# Patient Record
Sex: Female | Born: 1975
Health system: Southern US, Community
[De-identification: ages and names within clinical notes are randomized; demographics above are authoritative.]

## PROBLEM LIST (undated history)

## (undated) DIAGNOSIS — K2950 Unspecified chronic gastritis without bleeding: Secondary | ICD-10-CM

## (undated) DIAGNOSIS — J45909 Unspecified asthma, uncomplicated: Secondary | ICD-10-CM

## (undated) DIAGNOSIS — G4730 Sleep apnea, unspecified: Secondary | ICD-10-CM

## (undated) DIAGNOSIS — K449 Diaphragmatic hernia without obstruction or gangrene: Secondary | ICD-10-CM

## (undated) DIAGNOSIS — Z8719 Personal history of other diseases of the digestive system: Secondary | ICD-10-CM

## (undated) DIAGNOSIS — N83201 Unspecified ovarian cyst, right side: Secondary | ICD-10-CM

## (undated) DIAGNOSIS — N809 Endometriosis, unspecified: Secondary | ICD-10-CM

## (undated) DIAGNOSIS — K219 Gastro-esophageal reflux disease without esophagitis: Secondary | ICD-10-CM

## (undated) DIAGNOSIS — N3281 Overactive bladder: Secondary | ICD-10-CM

## (undated) DIAGNOSIS — Z9889 Other specified postprocedural states: Secondary | ICD-10-CM

## (undated) DIAGNOSIS — R519 Headache, unspecified: Secondary | ICD-10-CM

## (undated) DIAGNOSIS — Z8669 Personal history of other diseases of the nervous system and sense organs: Secondary | ICD-10-CM

## (undated) DIAGNOSIS — N393 Stress incontinence (female) (male): Secondary | ICD-10-CM

## (undated) DIAGNOSIS — E041 Nontoxic single thyroid nodule: Secondary | ICD-10-CM

## (undated) DIAGNOSIS — G43909 Migraine, unspecified, not intractable, without status migrainosus: Secondary | ICD-10-CM

## (undated) DIAGNOSIS — F419 Anxiety disorder, unspecified: Secondary | ICD-10-CM

## (undated) DIAGNOSIS — F32A Depression, unspecified: Secondary | ICD-10-CM

## (undated) DIAGNOSIS — Z8659 Personal history of other mental and behavioral disorders: Secondary | ICD-10-CM

## (undated) DIAGNOSIS — K2270 Barrett's esophagus without dysplasia: Secondary | ICD-10-CM

## (undated) DIAGNOSIS — F329 Major depressive disorder, single episode, unspecified: Secondary | ICD-10-CM

## (undated) DIAGNOSIS — T4145XA Adverse effect of unspecified anesthetic, initial encounter: Secondary | ICD-10-CM

## (undated) DIAGNOSIS — T7840XA Allergy, unspecified, initial encounter: Secondary | ICD-10-CM

## (undated) DIAGNOSIS — D649 Anemia, unspecified: Secondary | ICD-10-CM

## (undated) DIAGNOSIS — E669 Obesity, unspecified: Secondary | ICD-10-CM

## (undated) DIAGNOSIS — E282 Polycystic ovarian syndrome: Secondary | ICD-10-CM

## (undated) DIAGNOSIS — T8859XA Other complications of anesthesia, initial encounter: Secondary | ICD-10-CM

## (undated) DIAGNOSIS — R112 Nausea with vomiting, unspecified: Secondary | ICD-10-CM

## (undated) DIAGNOSIS — R51 Headache: Secondary | ICD-10-CM

## (undated) HISTORY — PX: TUBAL LIGATION: SHX77

## (undated) HISTORY — PX: WISDOM TOOTH EXTRACTION: SHX21

## (undated) HISTORY — PX: TEMPOROMANDIBULAR JOINT SURGERY: SHX35

## (undated) HISTORY — DX: Allergy, unspecified, initial encounter: T78.40XA

## (undated) HISTORY — PX: DIAGNOSTIC LAPAROSCOPY: SUR761

## (undated) HISTORY — PX: CHOLECYSTECTOMY: SHX55

## (undated) HISTORY — PX: KNEE ARTHROSCOPY: SUR90

## (undated) HISTORY — PX: ESOPHAGOGASTRODUODENOSCOPY: SHX1529

---

## 2001-07-01 HISTORY — PX: TEMPOROMANDIBULAR JOINT SURGERY: SHX35

## 2003-07-02 DIAGNOSIS — Z8672 Personal history of thrombophlebitis: Secondary | ICD-10-CM

## 2003-07-02 HISTORY — DX: Personal history of thrombophlebitis: Z86.72

## 2004-07-01 HISTORY — PX: KNEE ARTHROSCOPY: SUR90

## 2004-07-01 HISTORY — PX: DIAGNOSTIC LAPAROSCOPY: SUR761

## 2009-07-01 HISTORY — PX: LAPAROSCOPIC CHOLECYSTECTOMY: SUR755

## 2010-08-07 ENCOUNTER — Ambulatory Visit: Payer: Self-pay | Admitting: Family Medicine

## 2011-03-05 ENCOUNTER — Ambulatory Visit: Payer: Self-pay | Admitting: Gastroenterology

## 2011-03-07 LAB — PATHOLOGY REPORT

## 2011-07-29 DIAGNOSIS — Z8639 Personal history of other endocrine, nutritional and metabolic disease: Secondary | ICD-10-CM | POA: Insufficient documentation

## 2012-05-01 ENCOUNTER — Ambulatory Visit: Payer: Self-pay | Admitting: Gastroenterology

## 2012-05-06 LAB — PATHOLOGY REPORT

## 2012-06-08 ENCOUNTER — Ambulatory Visit: Payer: Self-pay | Admitting: Gastroenterology

## 2012-06-08 IMAGING — US ABDOMEN ULTRASOUND LIMITED
1 series · 14 of 25 positions shown · non-contrast
Comparison: none

REASON FOR EXAM: RUQ abd pain Dr requests general not ltd per RIPFUMELO
COMMENTS:

PROCEDURE:     RIPFUMELO - RIPFUMELO ABDOMEN UPPER GENERAL  - [DATE]  [DATE]
RESULT:

[Series 1: abdomen ultrasound limited · 0.43mm/px · 14 of 77 slices shown]
[im 1/77]
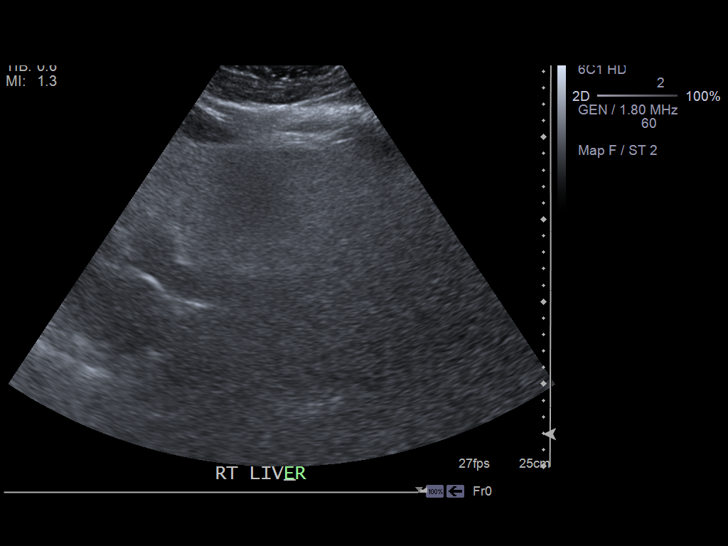
[im 7/77]
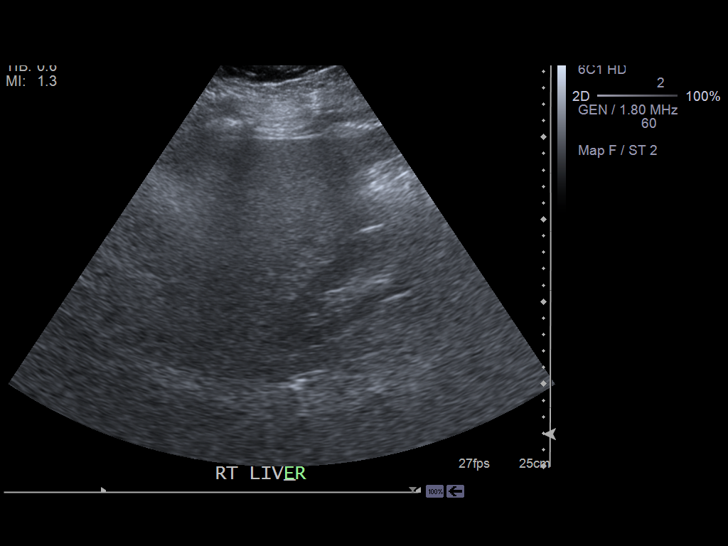
[im 13/77]
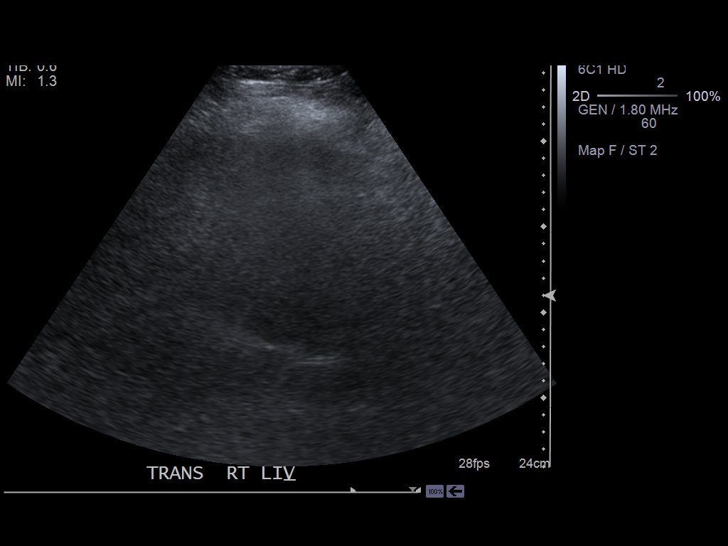
[im 20/77]
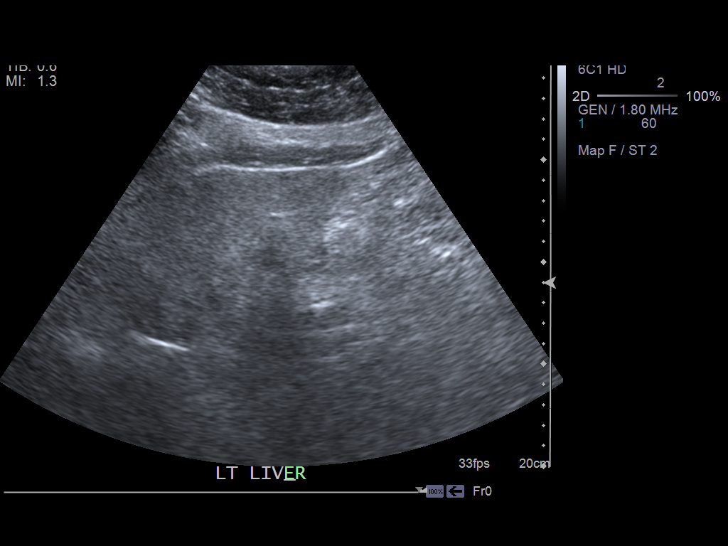
[im 26/77]
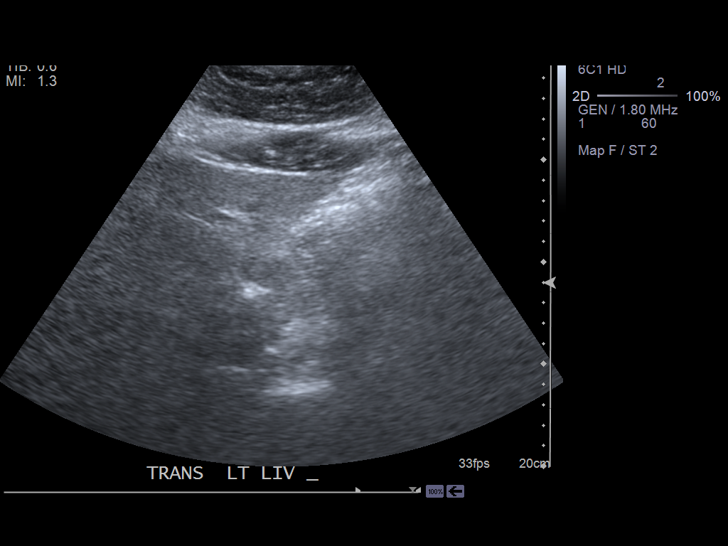
[im 29/77]
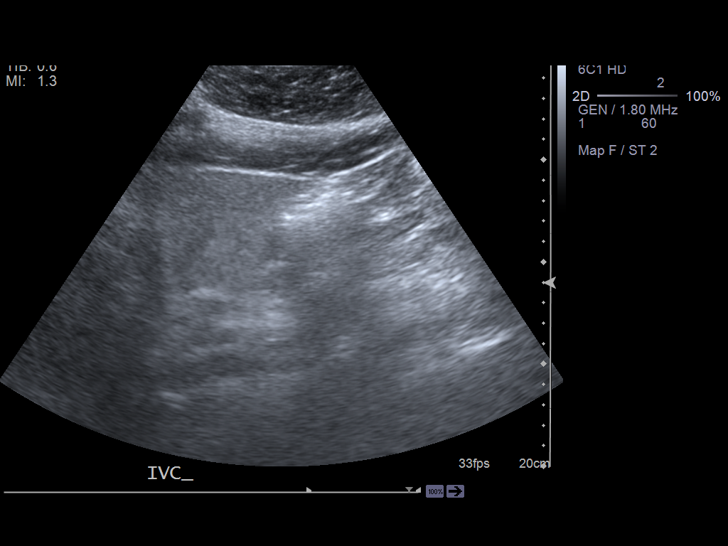
[im 35/77]
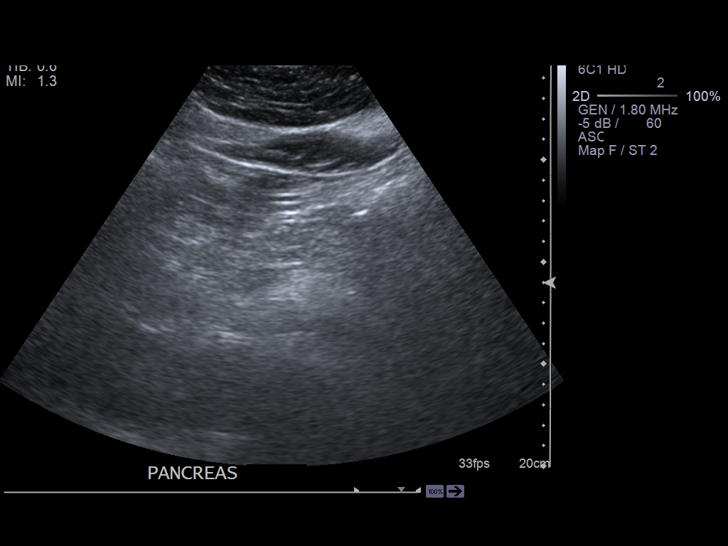
[im 42/77]
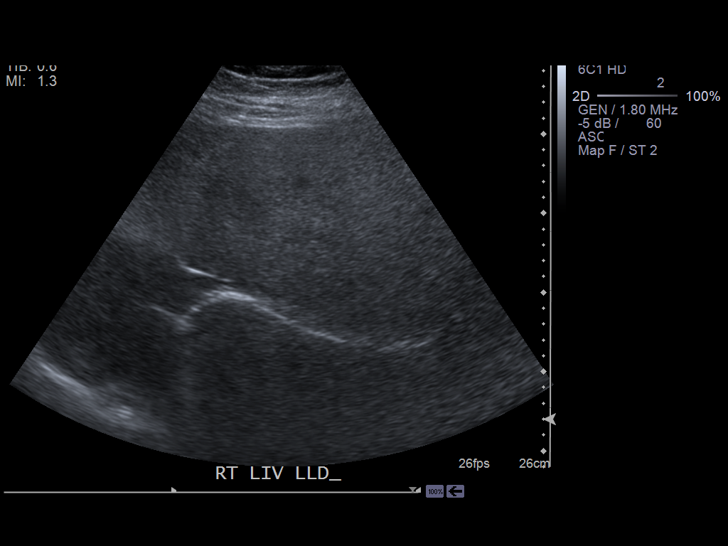
[im 48/77]
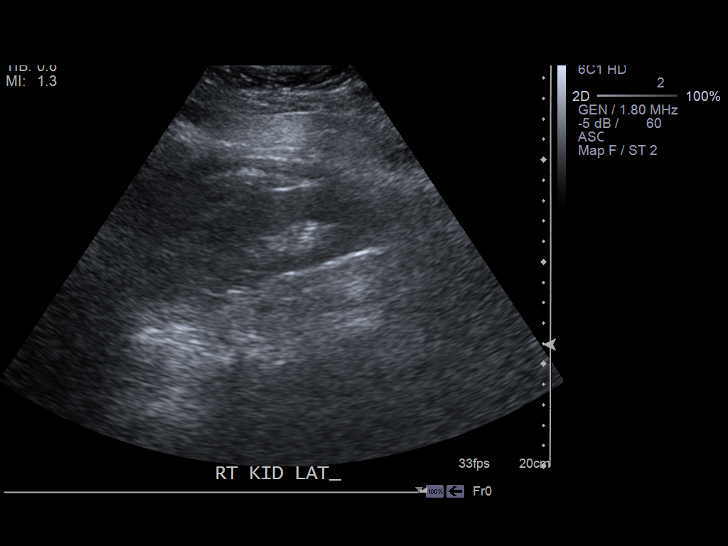
[im 51/77]
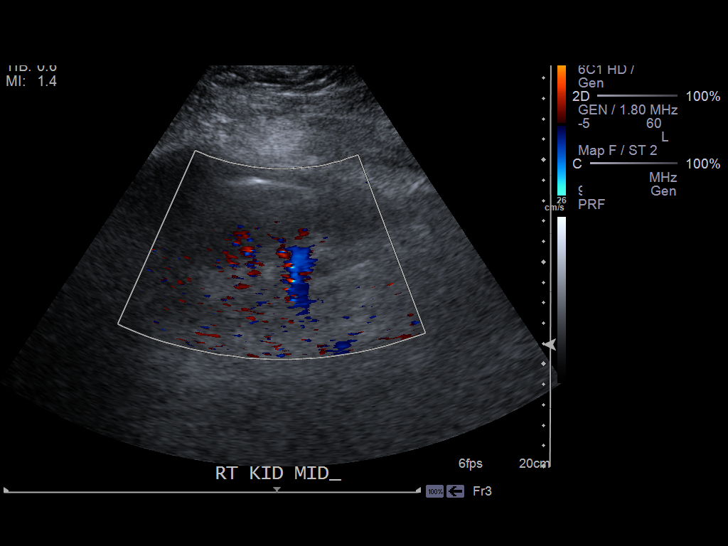
[im 58/77]
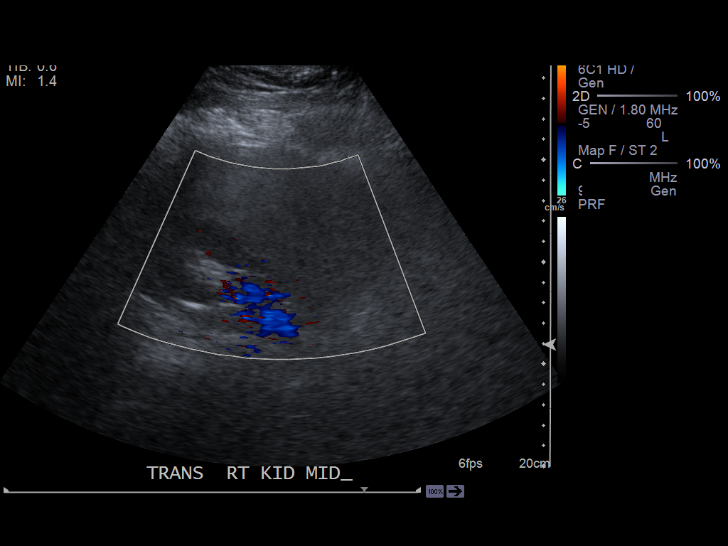
[im 64/77]
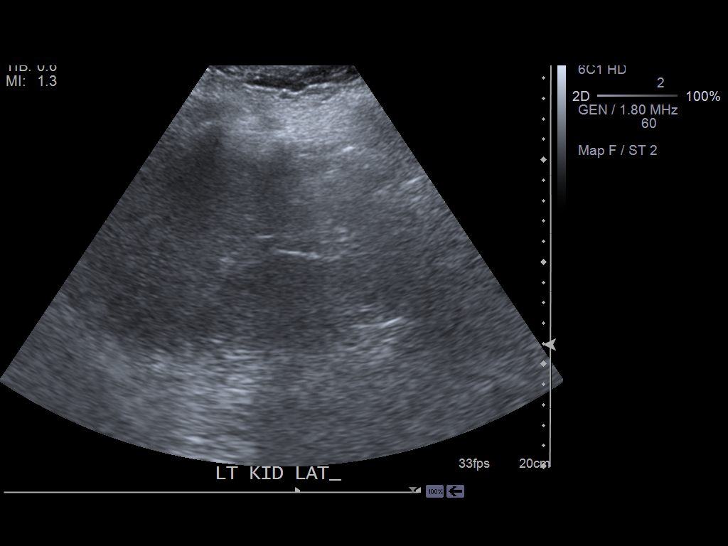
[im 70/77]
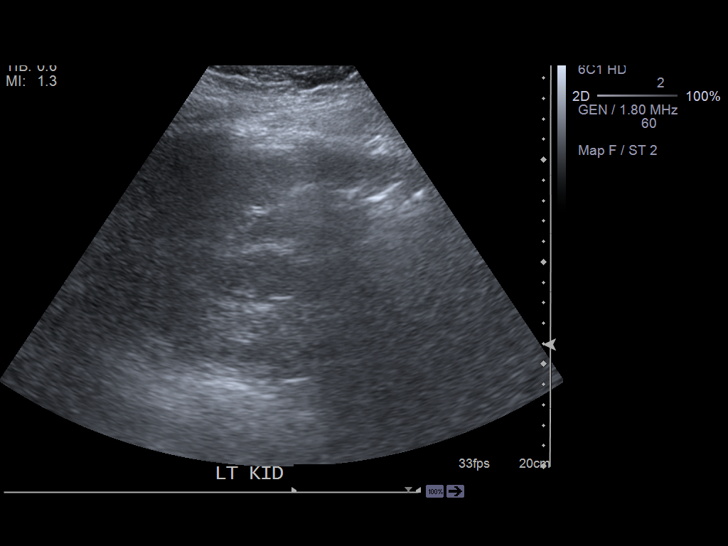
[im 77/77]
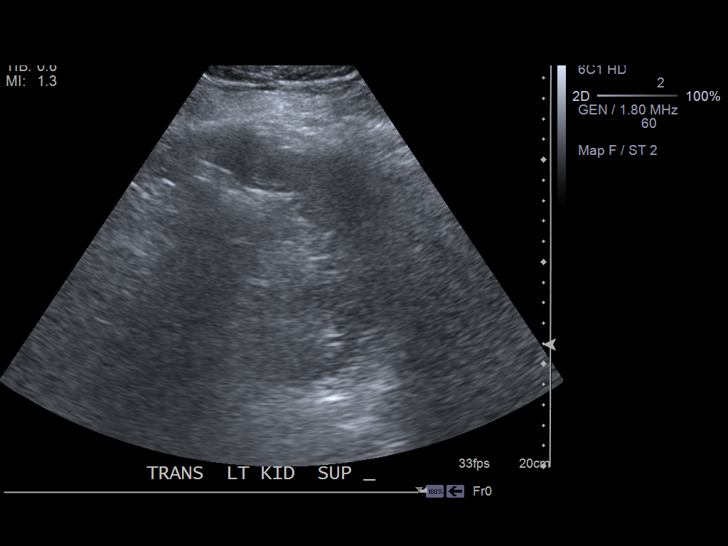

[14 of 25 positions shown; findings below may reference images not displayed]

FINDINGS: Sonographic evaluation of the abdomen is performed. There is a
history of cholecystectomy. The liver is echogenic and slightly enlarged at
18.49 cm in length. There is no intrahepatic biliary ductal dilation or
evidence of a focal hepatic mass. The portal venous flow is normal. No
ascites is evident. The common bile duct diameter is 5.0 cm. The pancreas
could not be visualized for overlying bowel gas. The spleen is normal in
size and echotexture as are the kidneys. The right kidney measures 12.11 x
4.62 x 5.31 cm. The spleen measures 11 cm. The left kidney measures 12.19 x
5.45 x 5.54 cm.
IMPRESSION: 1.  Increased echotexture of the liver likely secondary to diffuse fatty
infiltration. Mild hepatomegaly.
2.  Nonvisualization of the pancreas.
3.  Status post cholecystectomy.
4.  Otherwise, unremarkable exam. This was ultrasound of the abdomen and not
a limited abdominal sonogram.

[REDACTED]

## 2012-07-07 ENCOUNTER — Ambulatory Visit: Payer: Self-pay | Admitting: Internal Medicine

## 2012-07-07 LAB — PREGNANCY, URINE: Pregnancy Test, Urine: NEGATIVE m[IU]/mL

## 2012-07-07 IMAGING — CR DG CHEST 2V
1 series · 2 of 2 positions shown · non-contrast
Comparison: none

REASON FOR EXAM: cough
COMMENTS:

PROCEDURE:     MDR - MDR CHEST PA(OR AP) AND LATERAL  - [DATE] [DATE]
RESULT:     The lungs are clear. The heart and pulmonary vessels are normal.
The bony and mediastinal structures are unremarkable. There is no effusion.
There is no pneumothorax or evidence of congestive failure.

[Series 1: pa · 0.17mm/px · 2 of 2 slices shown]
[im 1/2]
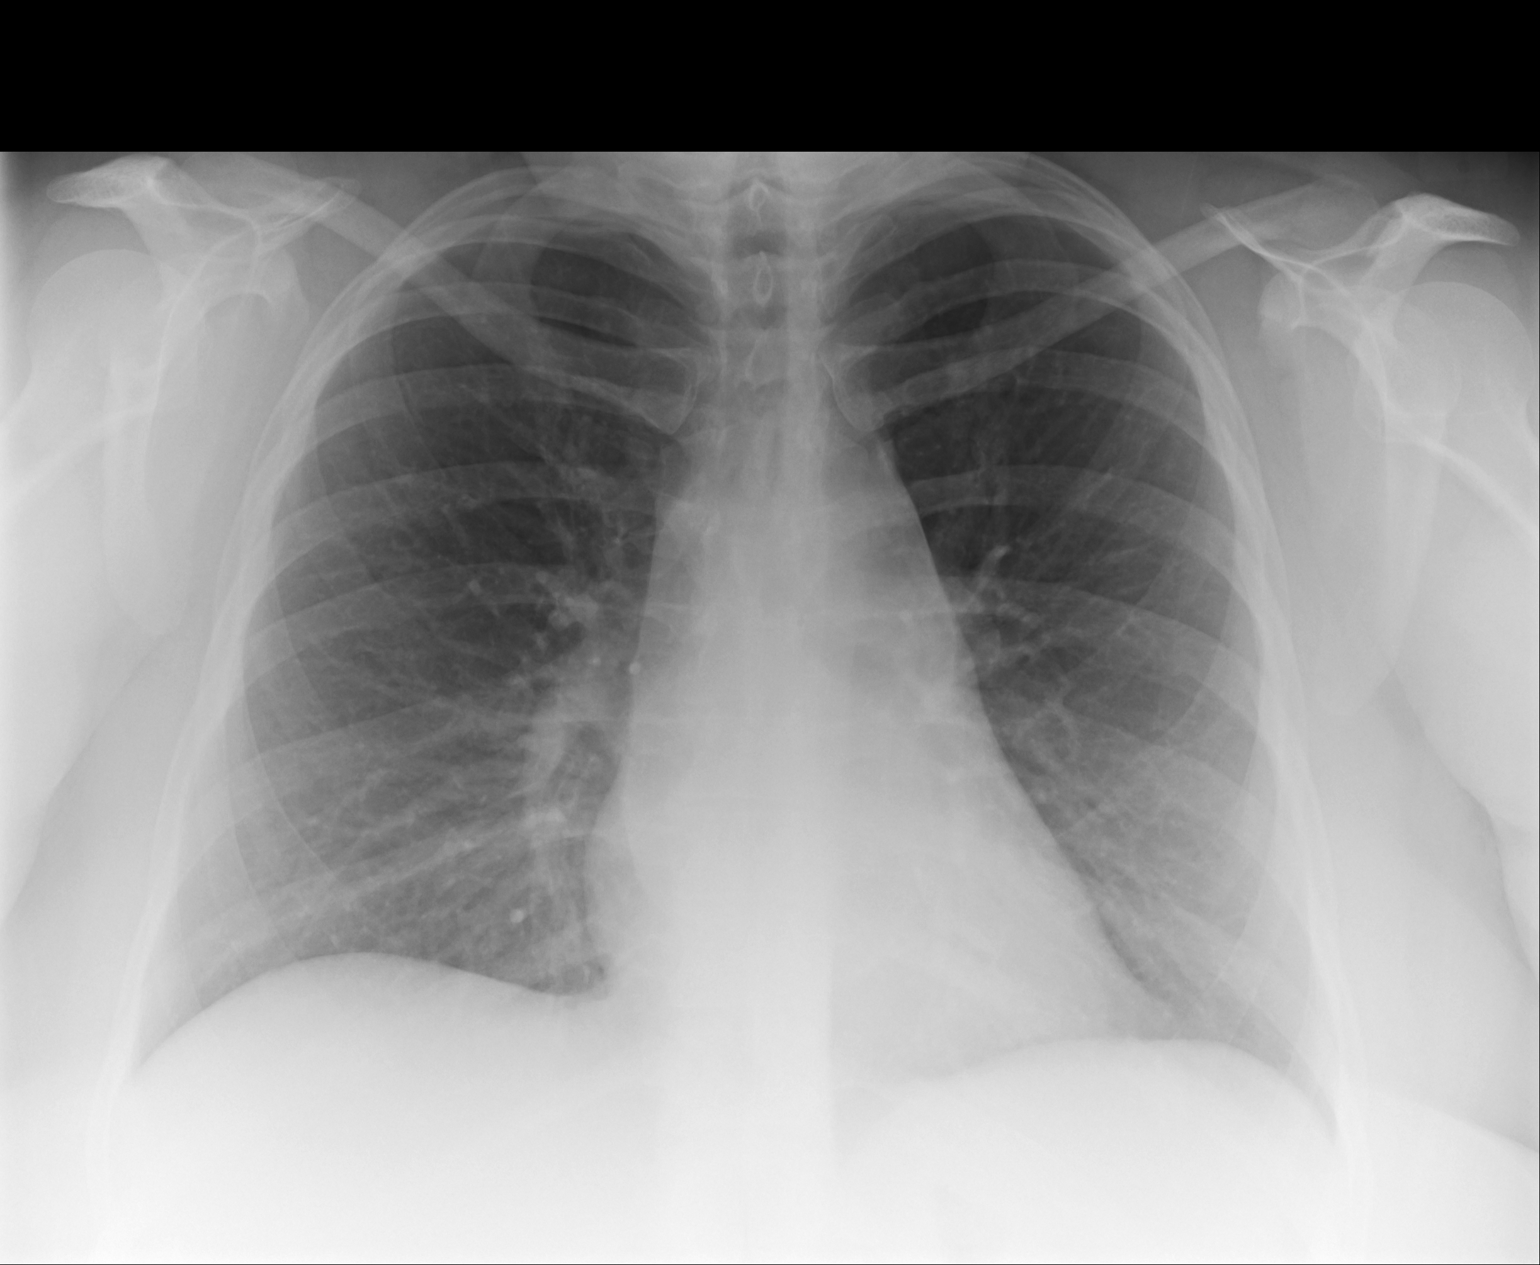
[im 2/2]
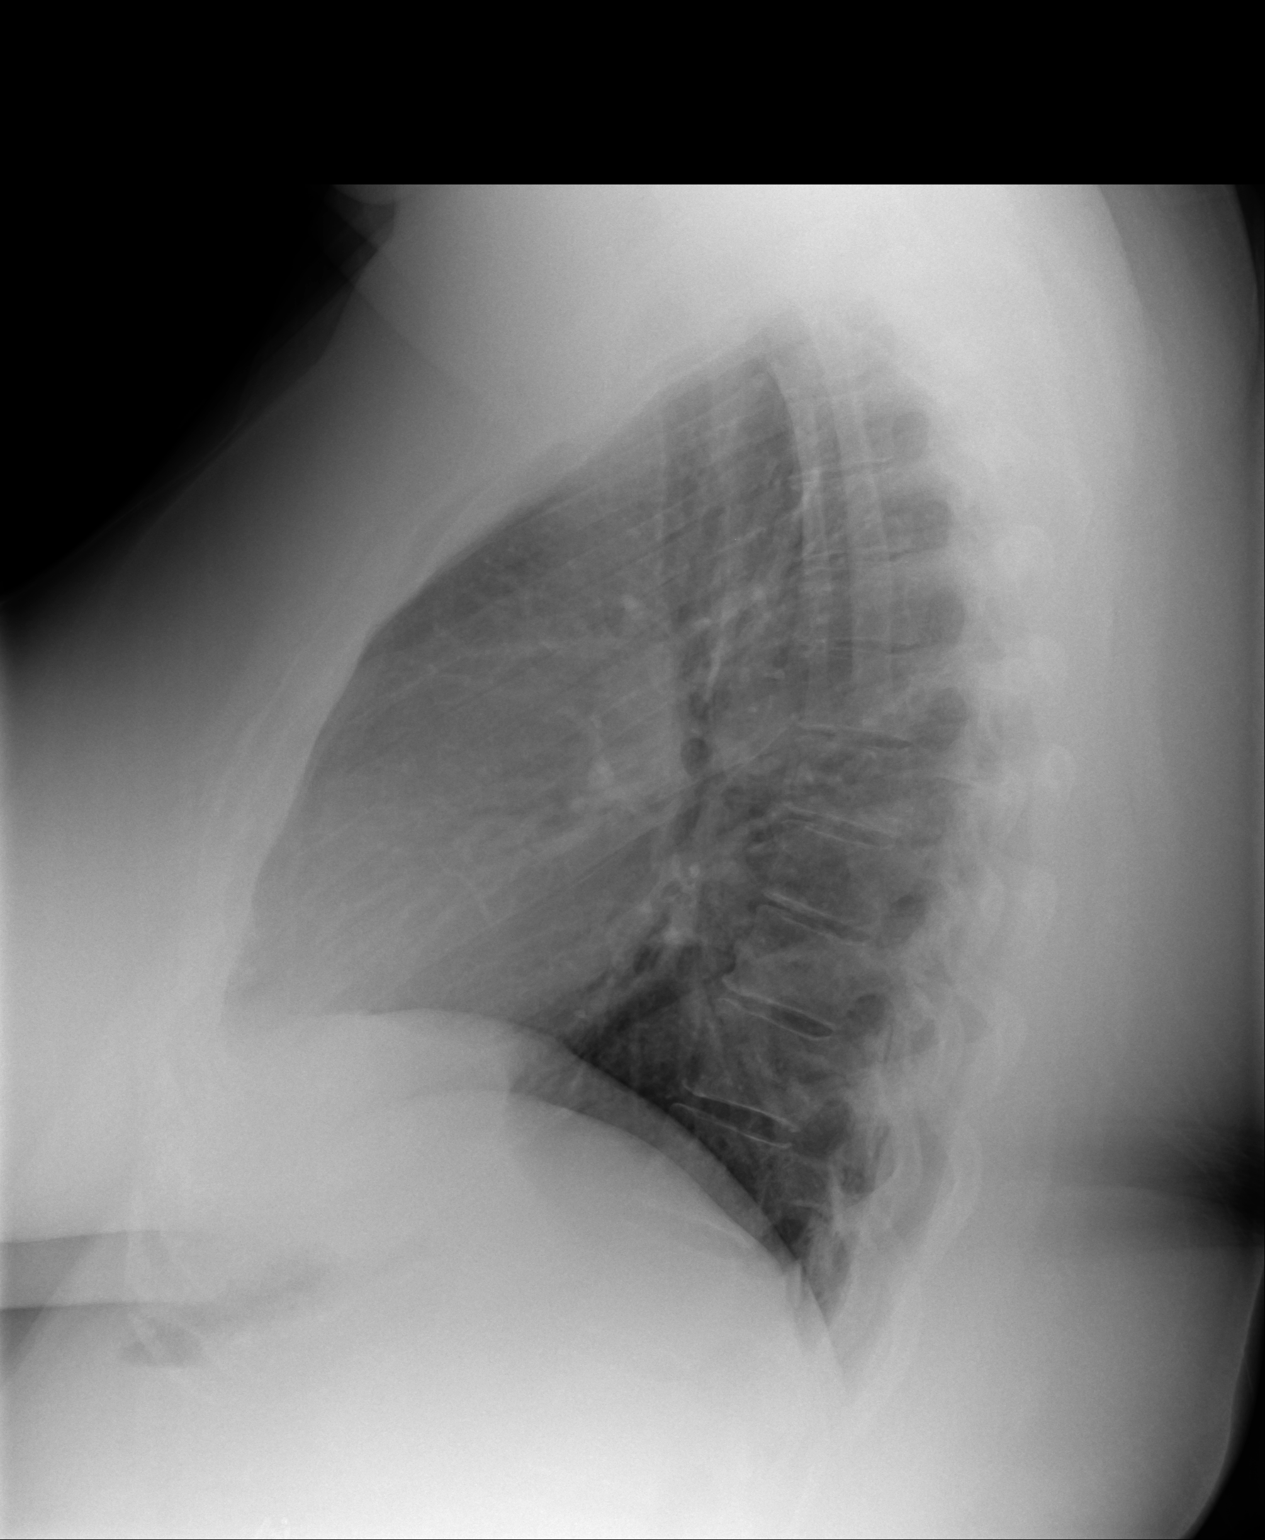

[2 of 2 positions shown; findings below may reference images not displayed]

IMPRESSION: No acute cardiopulmonary disease.

[REDACTED]

## 2012-07-18 ENCOUNTER — Emergency Department: Payer: Self-pay | Admitting: Internal Medicine

## 2012-07-18 LAB — BASIC METABOLIC PANEL
Anion Gap: 6 — ABNORMAL LOW (ref 7–16)
BUN: 9 mg/dL (ref 7–18)
Calcium, Total: 8.4 mg/dL — ABNORMAL LOW (ref 8.5–10.1)
Chloride: 103 mmol/L (ref 98–107)
Co2: 29 mmol/L (ref 21–32)
Creatinine: 0.82 mg/dL (ref 0.60–1.30)
EGFR (African American): >60
EGFR (Non-African Amer.): >60
Glucose: 83 mg/dL (ref 65–99)
Osmolality: 274 (ref 275–301)
Potassium: 3.8 mmol/L (ref 3.5–5.1)
Sodium: 138 mmol/L (ref 136–145)

## 2012-07-18 LAB — CBC
HCT: 42.5 % (ref 35.0–47.0)
HGB: 13.3 g/dL (ref 12.0–16.0)
MCH: 26.7 pg (ref 26.0–34.0)
MCHC: 31.2 g/dL — ABNORMAL LOW (ref 32.0–36.0)
MCV: 86 fL (ref 80–100)
Platelet: 288 10*3/uL (ref 150–440)
RBC: 4.97 10*6/uL (ref 3.80–5.20)
RDW: 13.3 % (ref 11.5–14.5)
WBC: 13.3 10*3/uL — ABNORMAL HIGH (ref 3.6–11.0)

## 2012-07-18 LAB — HCG, QUANTITATIVE, PREGNANCY: Beta Hcg, Quant.: <1 m[IU]/mL — ABNORMAL LOW

## 2012-07-18 IMAGING — CR DG CHEST 2V
1 series · 2 of 2 positions shown · non-contrast
Comparison: none

REASON FOR EXAM: SOB
COMMENTS:

PROCEDURE:     DXR - DXR CHEST PA (OR AP) AND LATERAL  - [DATE]  [DATE]
RESULT:     Comparison: [DATE]

[Series 1: pa · 0.17mm/px · 2 of 2 slices shown]
[im 1/2]
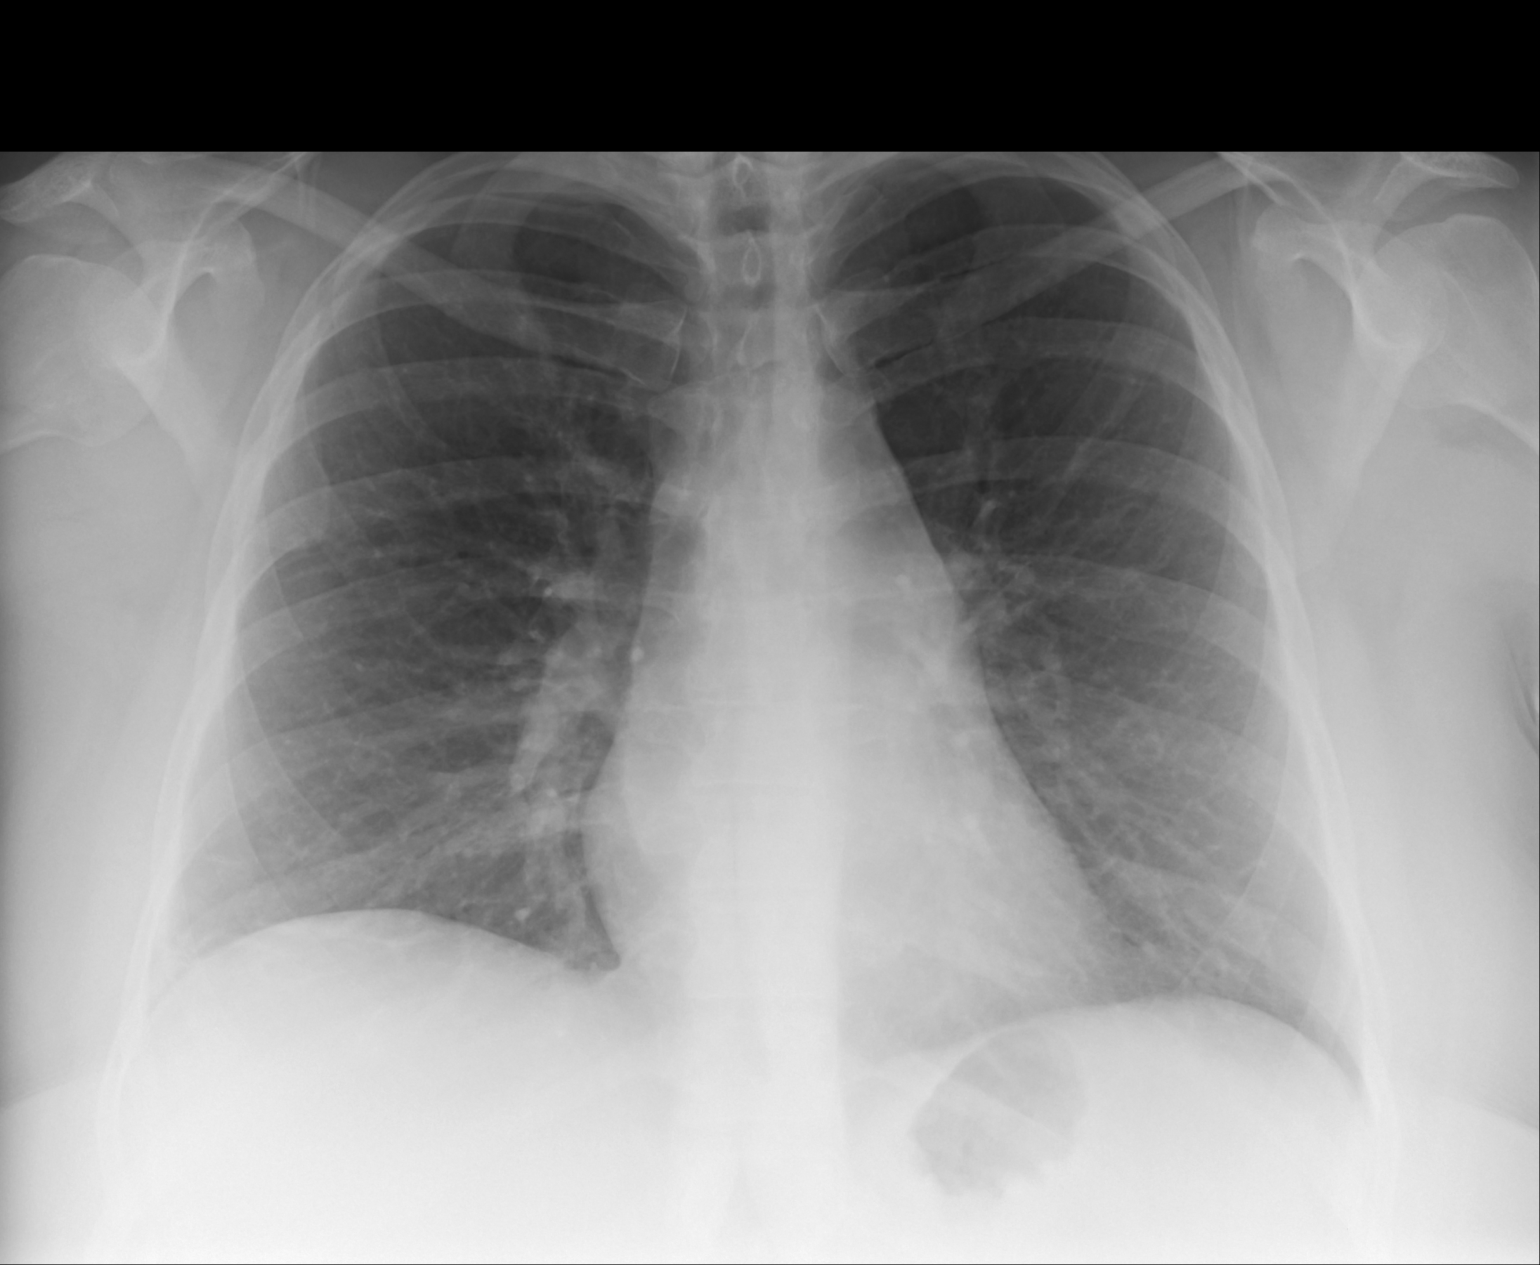
[im 2/2]
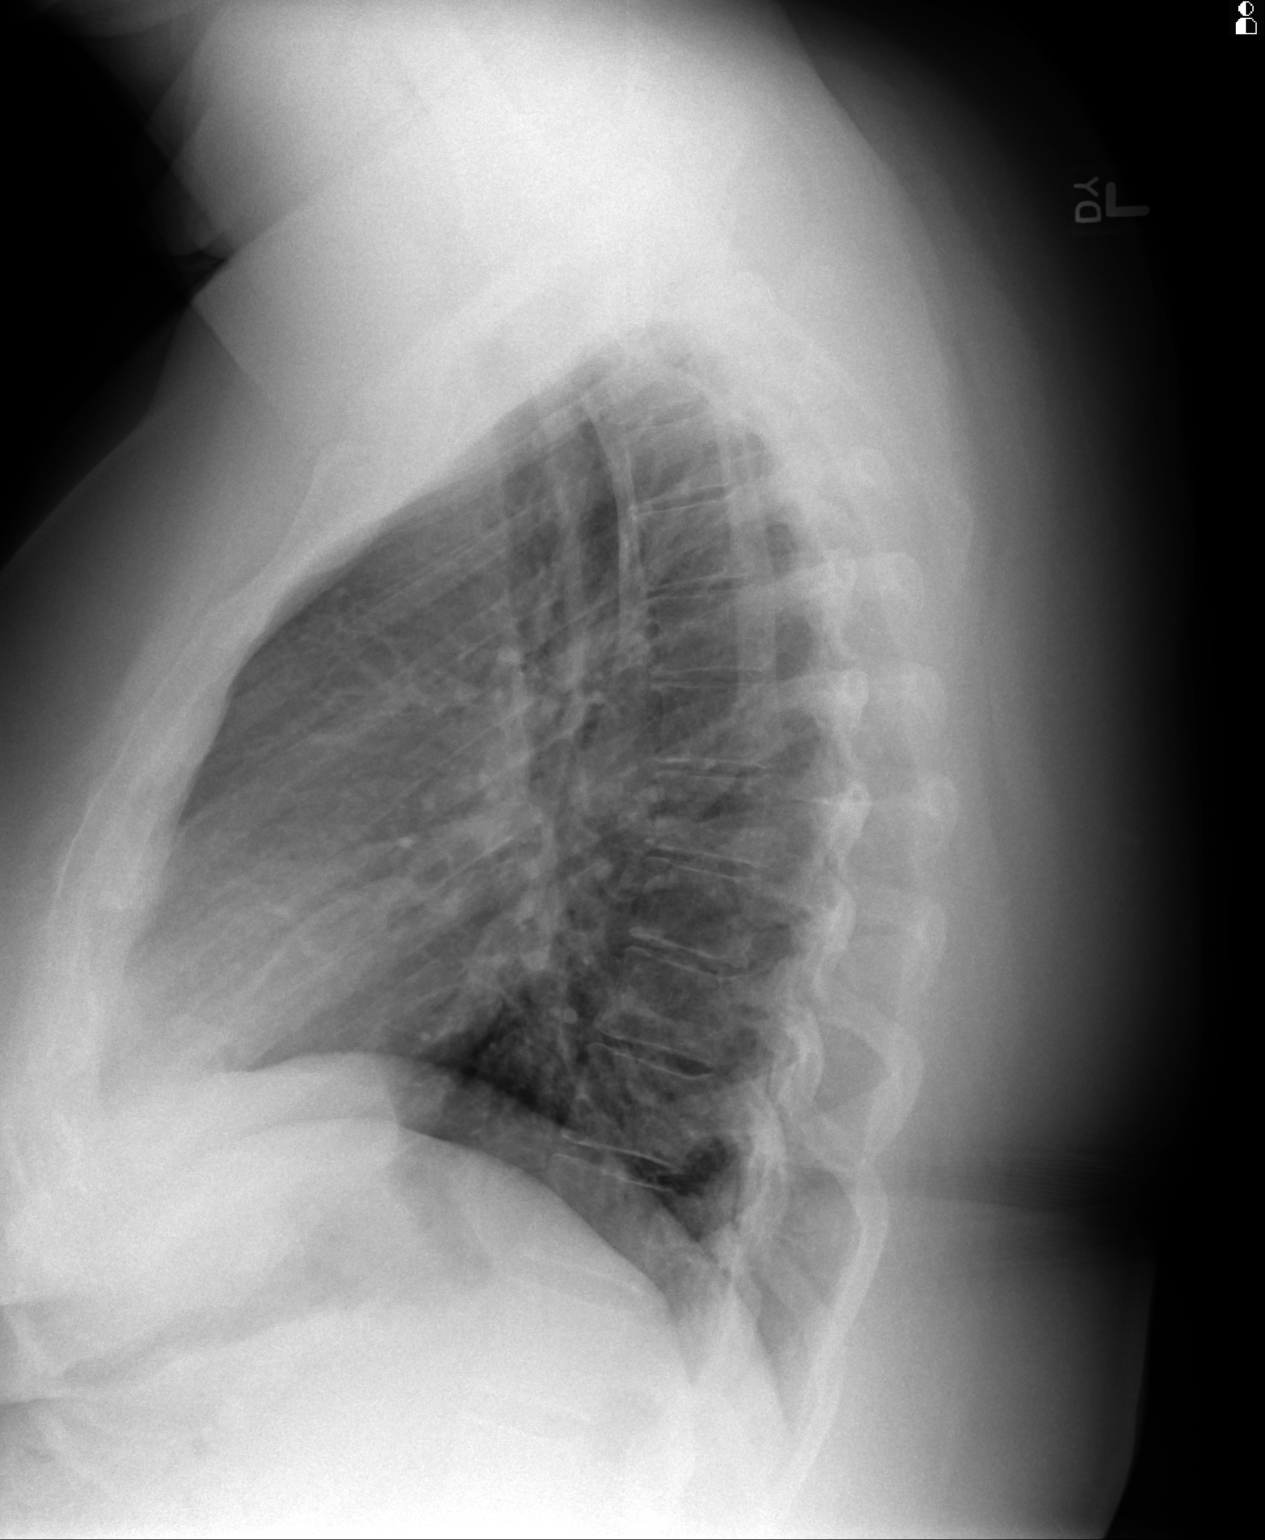

[2 of 2 positions shown; findings below may reference images not displayed]

FINDINGS: The heart and mediastinum are stable. No focal pulmonary opacities.
IMPRESSION: No acute cardiopulmonary disease.

[REDACTED]

## 2012-10-23 ENCOUNTER — Ambulatory Visit: Payer: Self-pay | Admitting: Gastroenterology

## 2012-10-26 LAB — PATHOLOGY REPORT

## 2012-10-28 ENCOUNTER — Ambulatory Visit: Payer: Self-pay | Admitting: Family Medicine

## 2012-10-28 LAB — URINALYSIS, COMPLETE
Bilirubin,UR: NEGATIVE
Glucose,UR: NEGATIVE mg/dL (ref 0–75)
Ketone: NEGATIVE
Nitrite: NEGATIVE
Ph: 8 (ref 4.5–8.0)
Protein: NEGATIVE
RBC,UR: >30 /HPF (ref 0–5)
Specific Gravity: 1.01 (ref 1.003–1.030)

## 2012-10-28 LAB — PREGNANCY, URINE: Pregnancy Test, Urine: NEGATIVE m[IU]/mL

## 2012-10-30 LAB — URINE CULTURE

## 2012-11-16 ENCOUNTER — Ambulatory Visit: Payer: Self-pay | Admitting: Obstetrics and Gynecology

## 2012-11-16 IMAGING — MG MM CAD SCREENING MAMMO
1 series · 4 of 4 positions shown · non-contrast
Comparison: none

REASON FOR EXAM: SCR MAMMO NO ORDER BASELINE
COMMENTS:

PROCEDURE:     MAM - MAM DGTL SCRN MAM NO ORDER W/CAD  - [DATE]  [DATE]
RESULT:     COMPARISON:  None
TECHNIQUE: Digital screening mammograms were obtained. FDA approved
computer-aided detection (CAD) for mammography was utilized for this study.
BREAST COMPOSITION: The breast composition is SCATTERED FIBROGLANDULAR
TISSUE (glandular tissue is  25-50%)

[R CC · right · 4 of 4 slices shown]
[im 1/4]
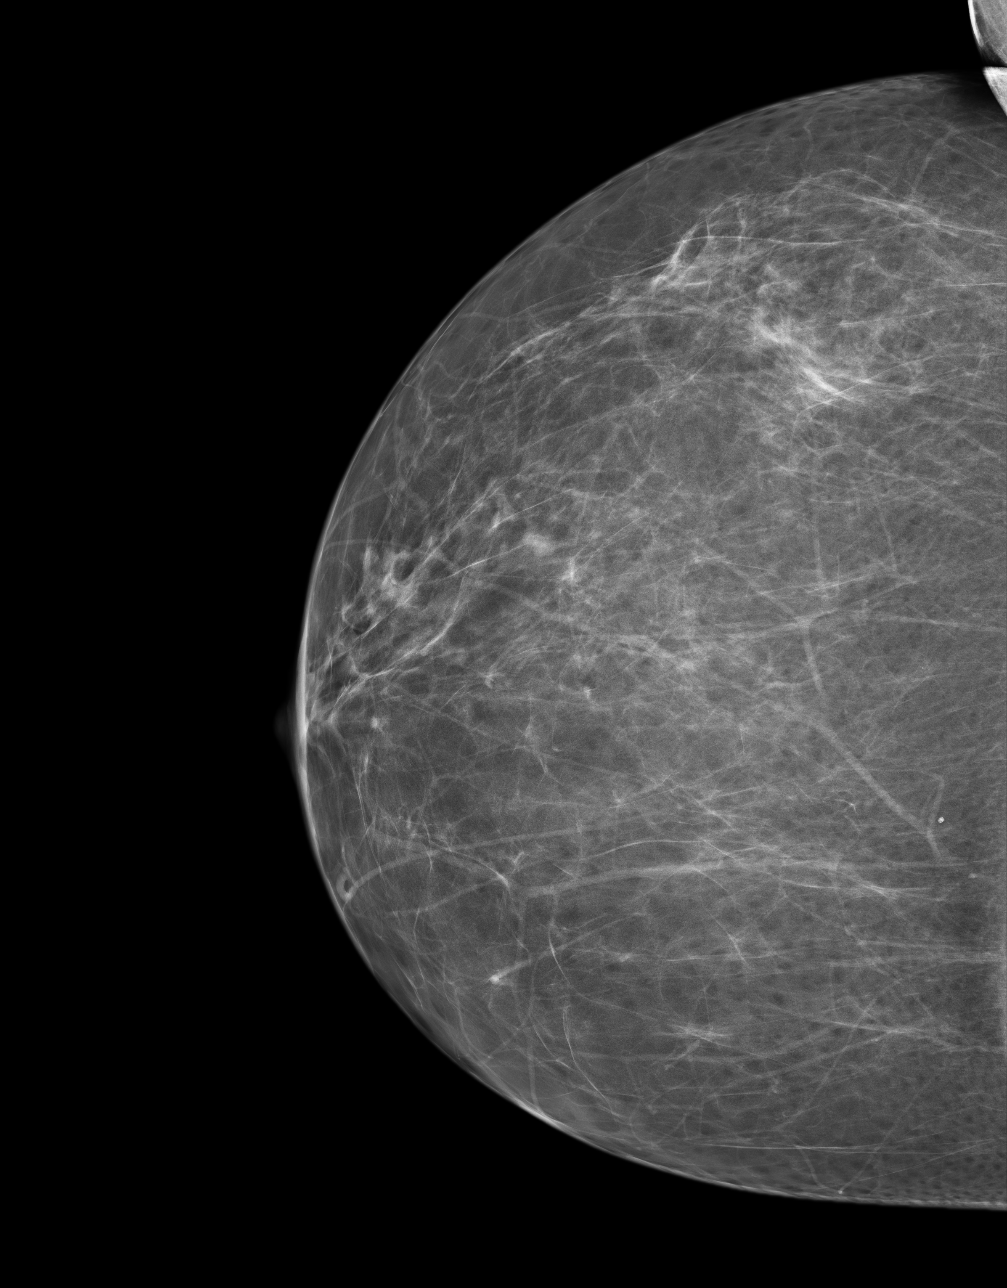
[im 2/4]
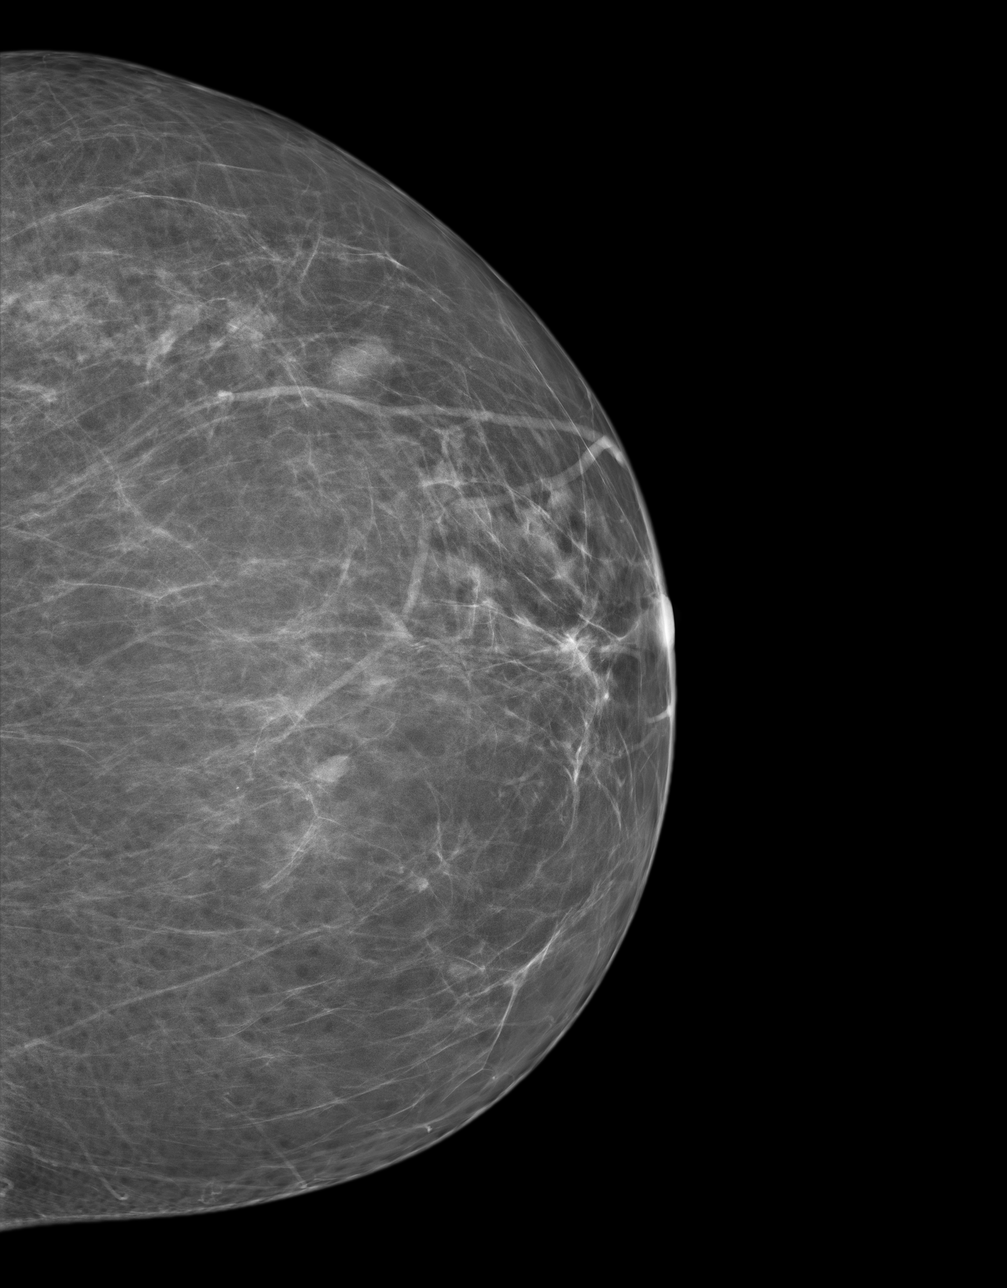
[im 3/4]
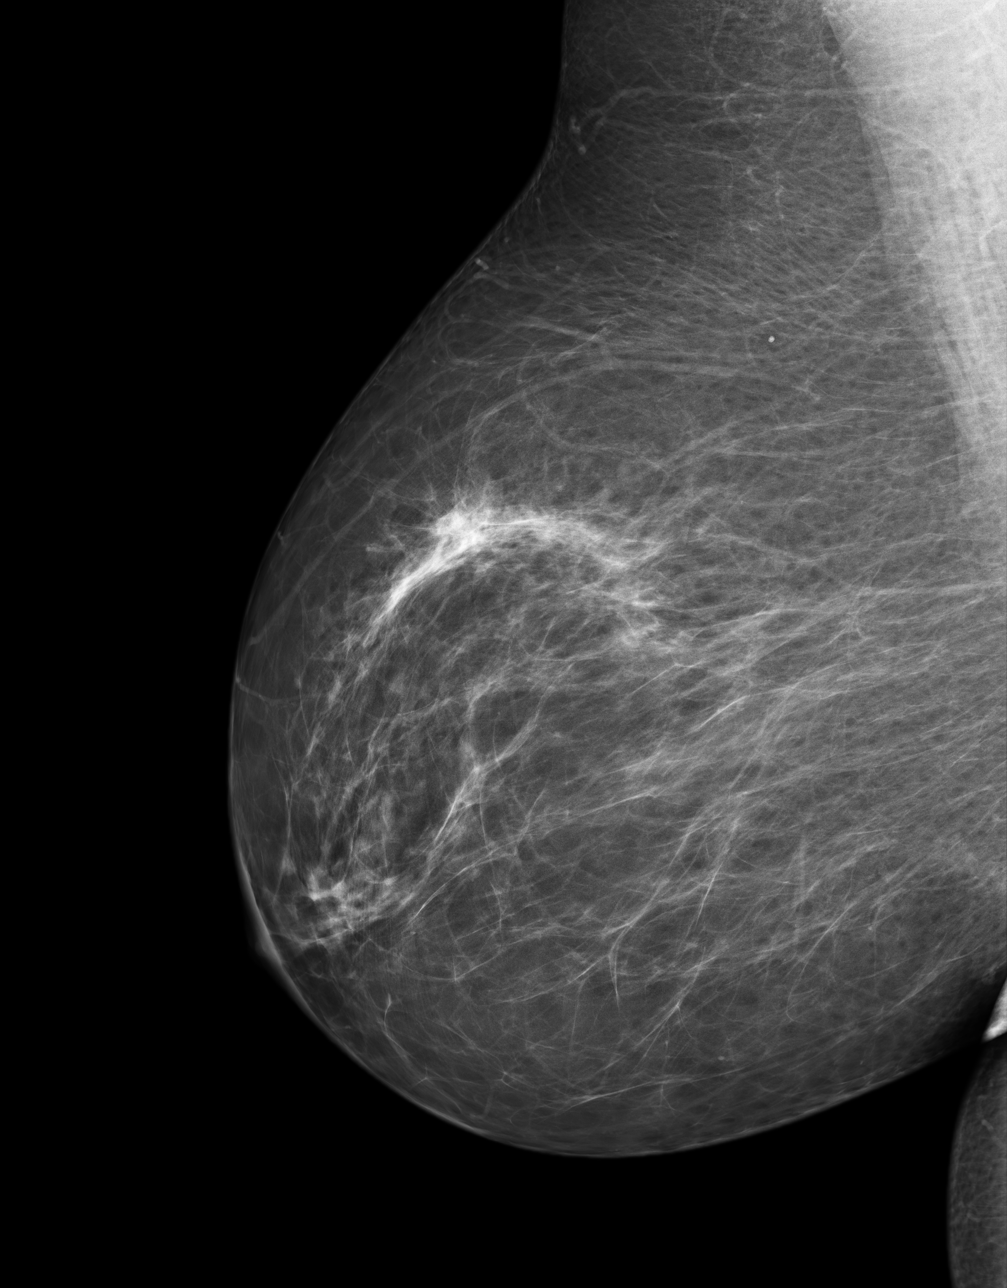
[im 4/4]
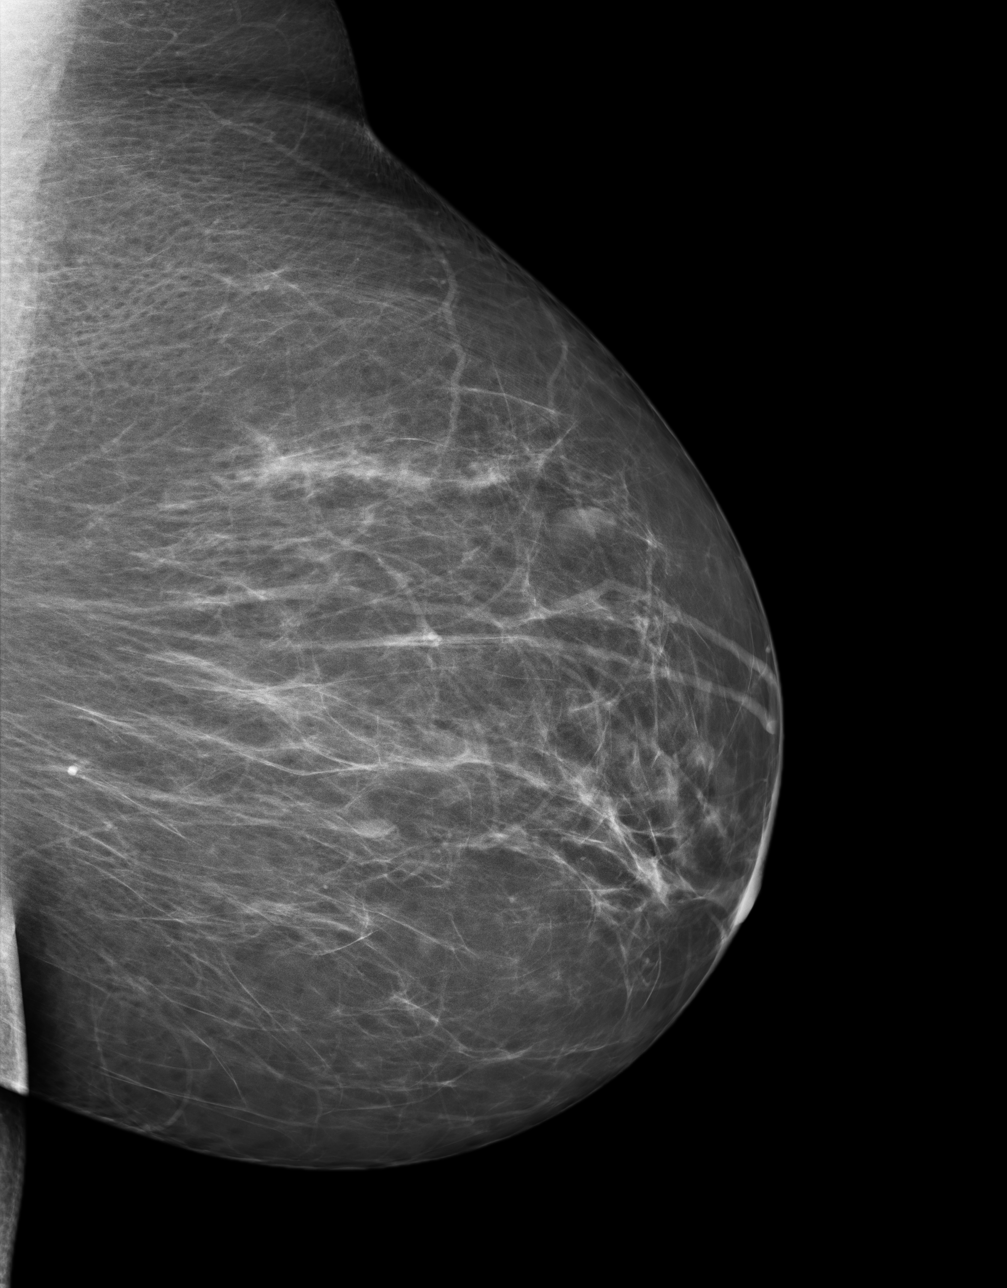

[4 of 4 positions shown; findings below may reference images not displayed]

FINDING: There is an asymmetry in the upper right breast. There is no dominant mass,
architectural distortion or clusters of suspicious microcalcifications.
IMPRESSION: 1.     Asymmetry in the upper right breast. Recommend spot compression views.

BI-RADS:  Category 0 - Needs Additional Imaging Evaluation

A negative mammogram report does not preclude biopsy or other evaluation of
a clinically palpable or otherwise suspicious mass or lesion. Breast cancer
may not be detected by mammography in up to 10% of cases.

[REDACTED]

## 2012-11-17 ENCOUNTER — Ambulatory Visit: Payer: Self-pay | Admitting: Obstetrics and Gynecology

## 2012-11-17 IMAGING — MG MM ADDITIONAL VIEWS AT NO CHARGE
1 series · 4 of 4 positions shown · non-contrast
Comparison: none

REASON FOR EXAM: av rt asymmetry
COMMENTS:

PROCEDURE:     MAM - MAM DIG ADDVIEWS RT SCR  - [DATE] [DATE]
RESULT:     TECHNIQUE: Digital diagnostic right mammograms were obtained.
FDA approved computer-aided detection (CAD) for mammography was utilized for
this study.

[R ML · right · 4 of 4 slices shown]
[im 1/4]
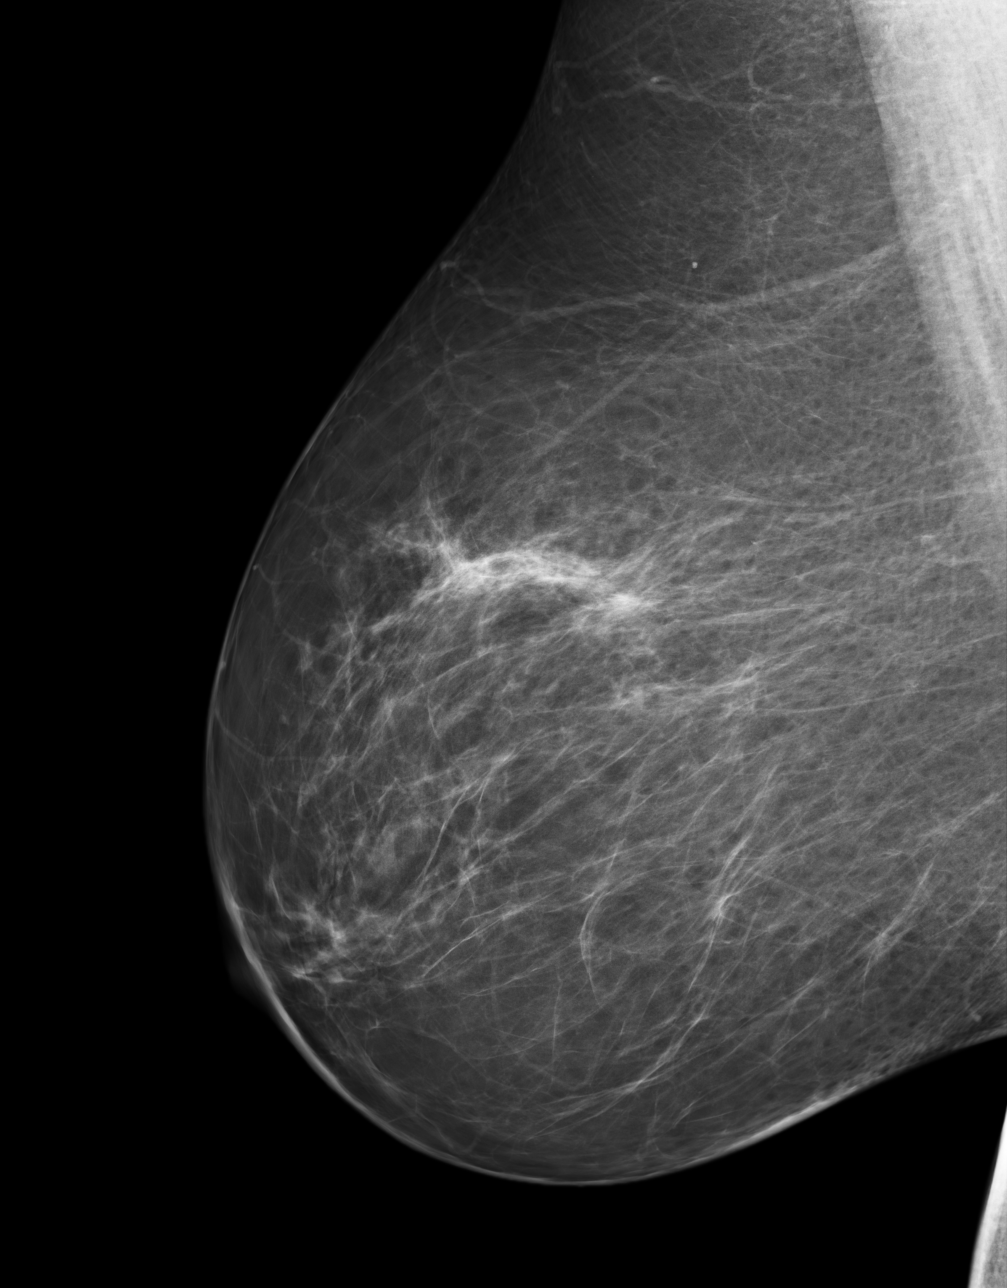
[im 2/4]
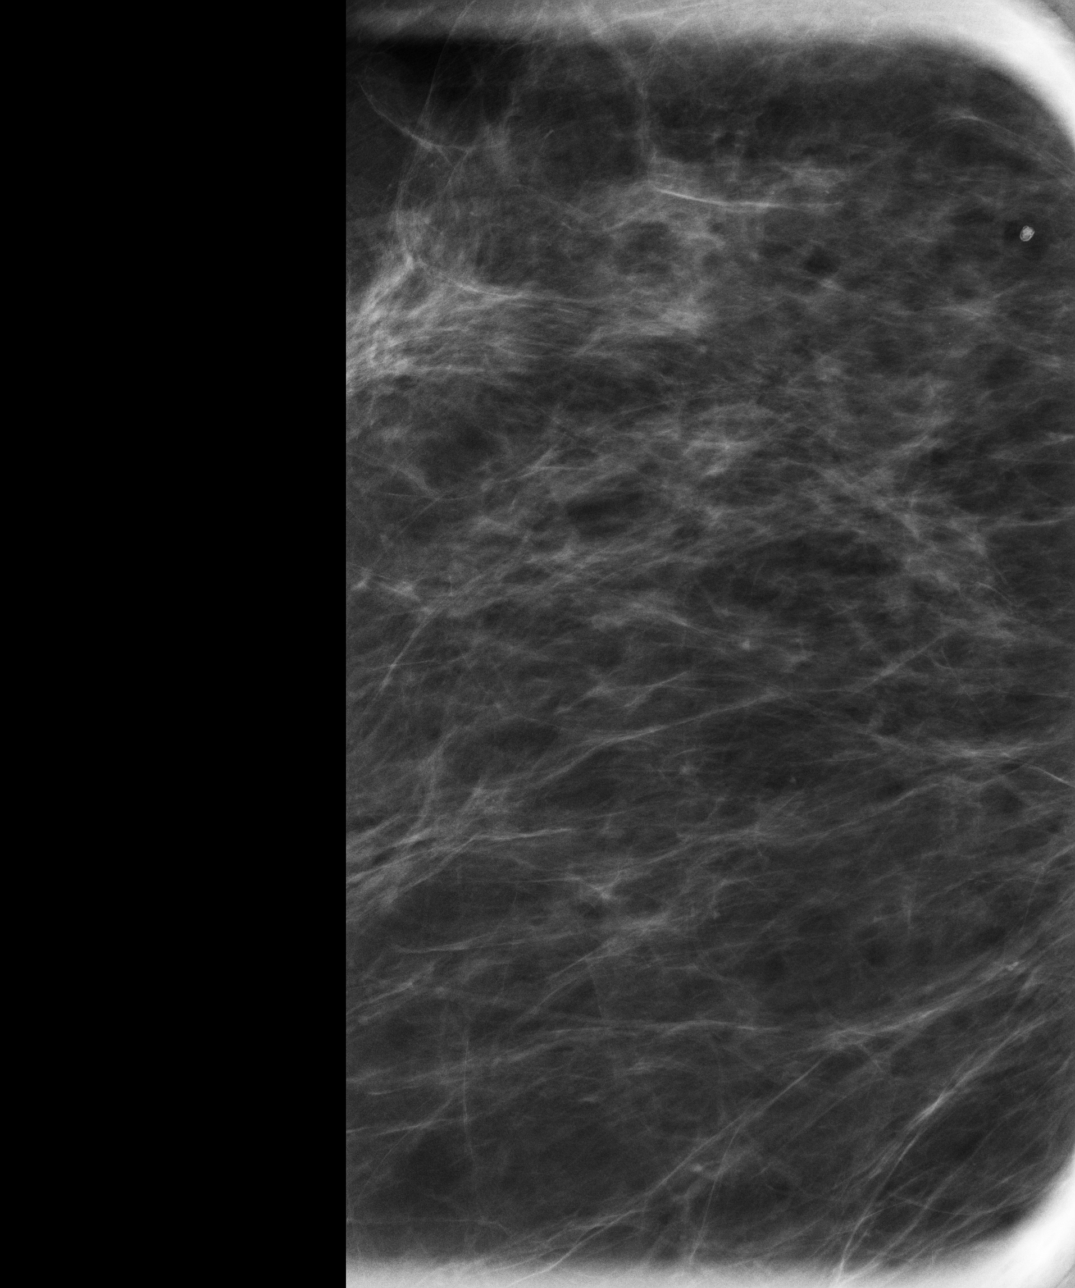
[im 3/4]
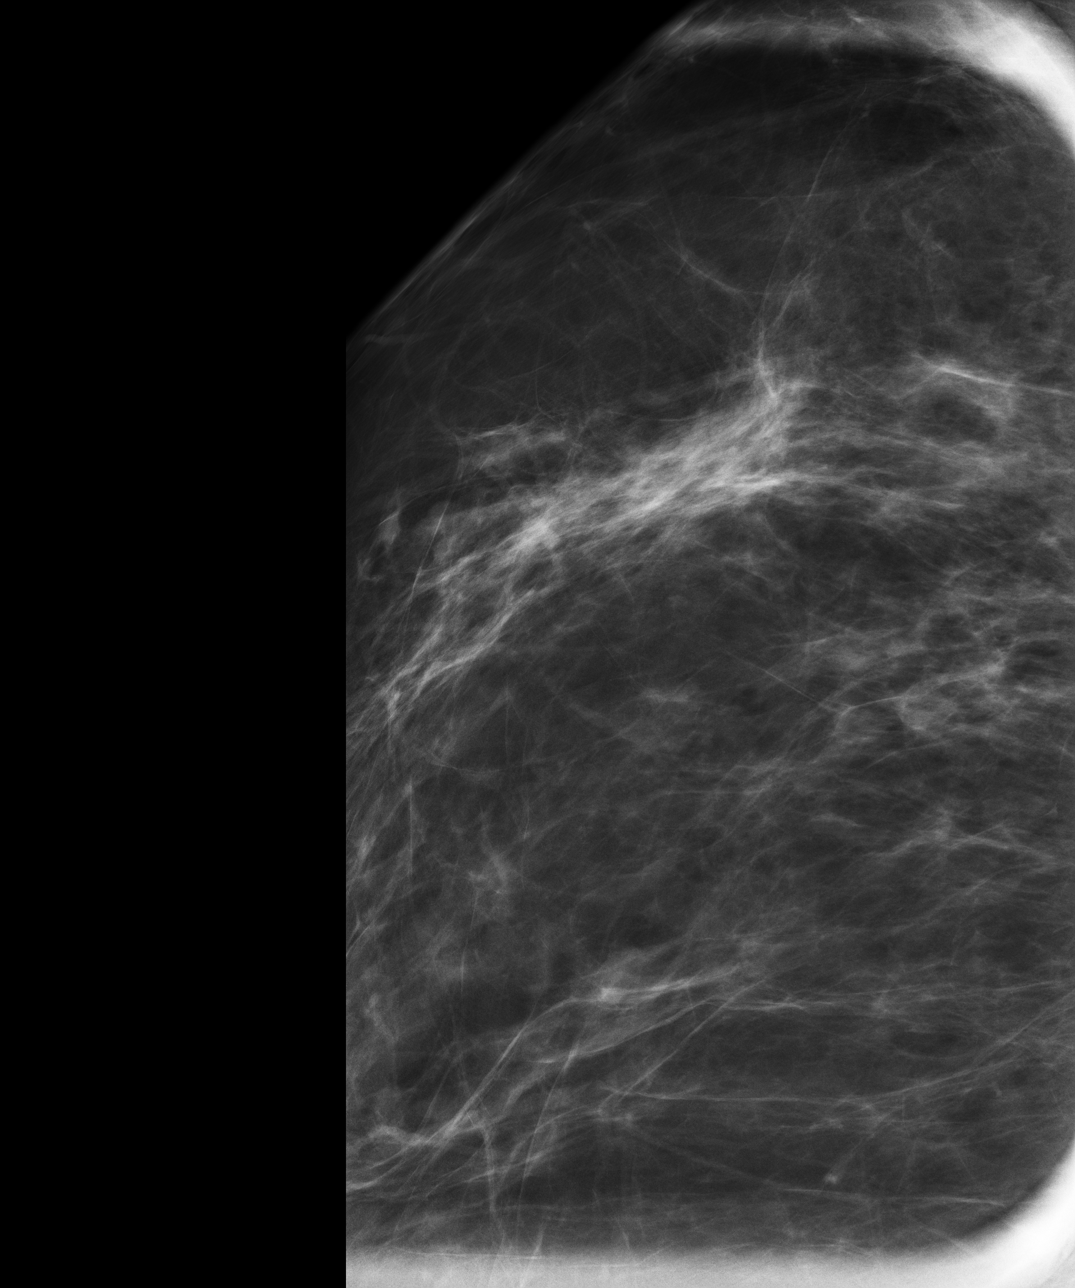
[im 4/4]
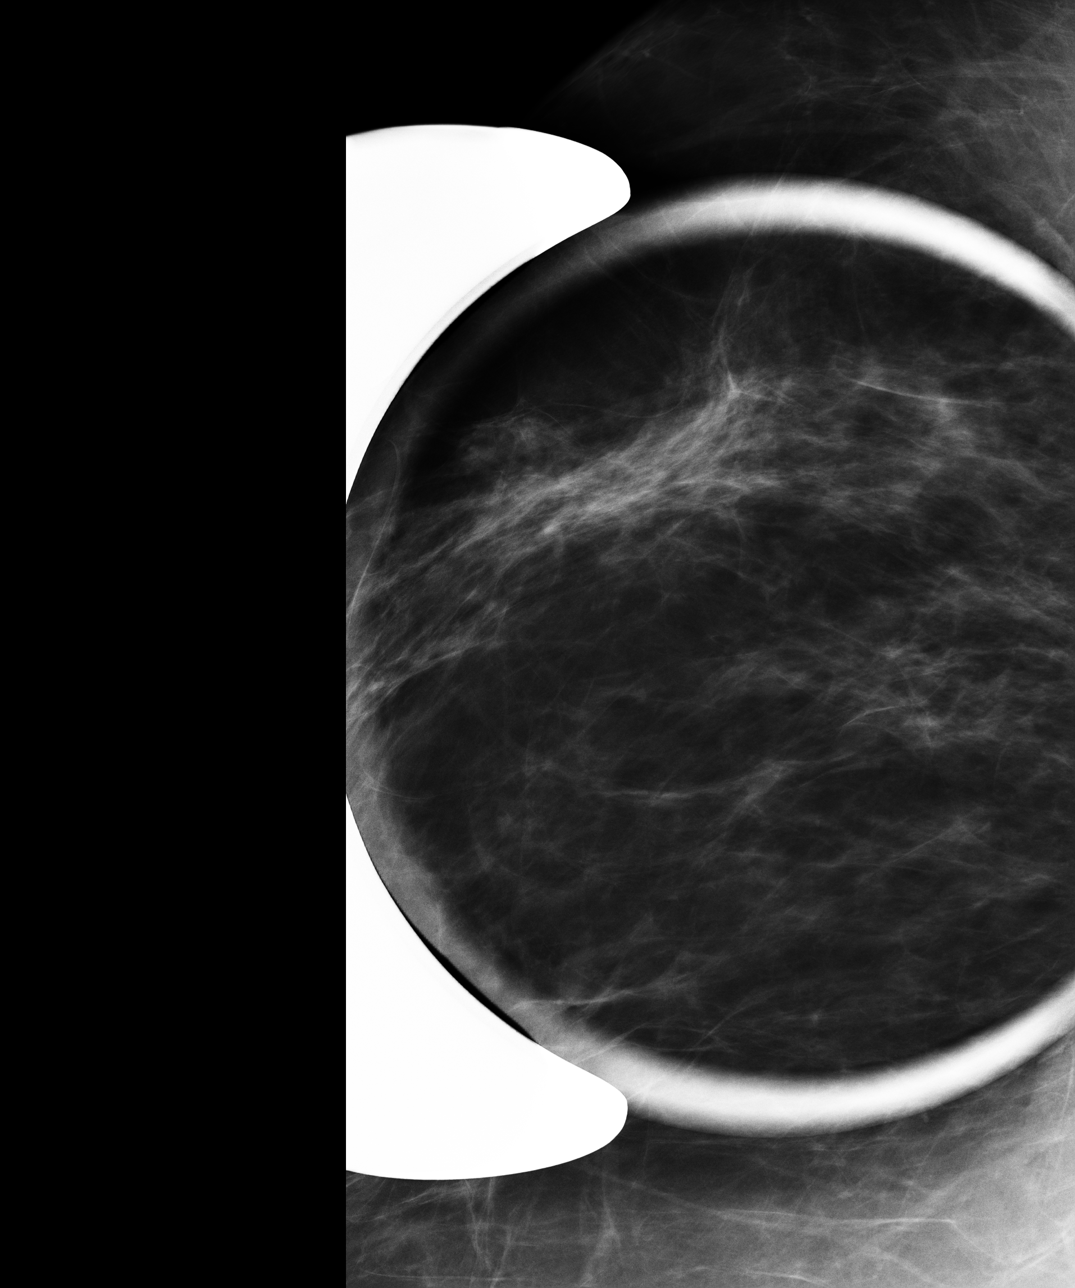

[4 of 4 positions shown; findings below may reference images not displayed]

FINDING: True lateral view and spot compression views of the right breast were
performed. The area of concern in the right superior breast compresses and
blends in with the adjacent fibroglandular tissue without a persistent mass
or architectural distortion. There are no suspicious microcalcifications.
IMPRESSION: 1.     Negative diagnostic right breast mammogram.
2.     Return to annual mammographic follow up recommended.

BI-RADS:  Category 2- Benign.

A negative mammogram report does not preclude biopsy or other evaluation of
a clinically palpable or otherwise suspicious mass or lesion. Breast cancer
may not be detected by mammography in up to 10% of cases.

[REDACTED]

## 2013-03-04 ENCOUNTER — Ambulatory Visit: Payer: Self-pay | Admitting: Specialist

## 2013-03-22 ENCOUNTER — Ambulatory Visit: Payer: Self-pay | Admitting: Specialist

## 2013-05-13 ENCOUNTER — Ambulatory Visit: Payer: Self-pay | Admitting: Physician Assistant

## 2013-05-13 LAB — URINALYSIS, COMPLETE
Bilirubin,UR: NEGATIVE
Blood: NEGATIVE
Glucose,UR: NEGATIVE mg/dL (ref 0–75)
Ketone: NEGATIVE
Leukocyte Esterase: NEGATIVE
Nitrite: NEGATIVE
Ph: 6.5 (ref 4.5–8.0)
Protein: NEGATIVE
Specific Gravity: 1.02 (ref 1.003–1.030)

## 2013-05-13 LAB — BASIC METABOLIC PANEL
Anion Gap: 8 (ref 7–16)
BUN: 14 mg/dL (ref 7–18)
Calcium, Total: 8.4 mg/dL — ABNORMAL LOW (ref 8.5–10.1)
Chloride: 103 mmol/L (ref 98–107)
Co2: 29 mmol/L (ref 21–32)
Creatinine: 0.94 mg/dL (ref 0.60–1.30)
EGFR (African American): >60
EGFR (Non-African Amer.): >60
Glucose: 89 mg/dL (ref 65–99)
Osmolality: 279 (ref 275–301)
Potassium: 4 mmol/L (ref 3.5–5.1)
Sodium: 140 mmol/L (ref 136–145)

## 2013-05-13 LAB — PREGNANCY, URINE: Pregnancy Test, Urine: NEGATIVE m[IU]/mL

## 2013-05-13 IMAGING — CR DG ANKLE COMPLETE 3+V*L*
1 series · 3 of 3 positions shown · non-contrast
Comparison: None.

CLINICAL DATA: Left ankle pain following an injury.

EXAM:
LEFT ANKLE COMPLETE - 3+ VIEW

[Series 1: ap · 0.17mm/px · 3 of 3 slices shown]
[im 1/3]
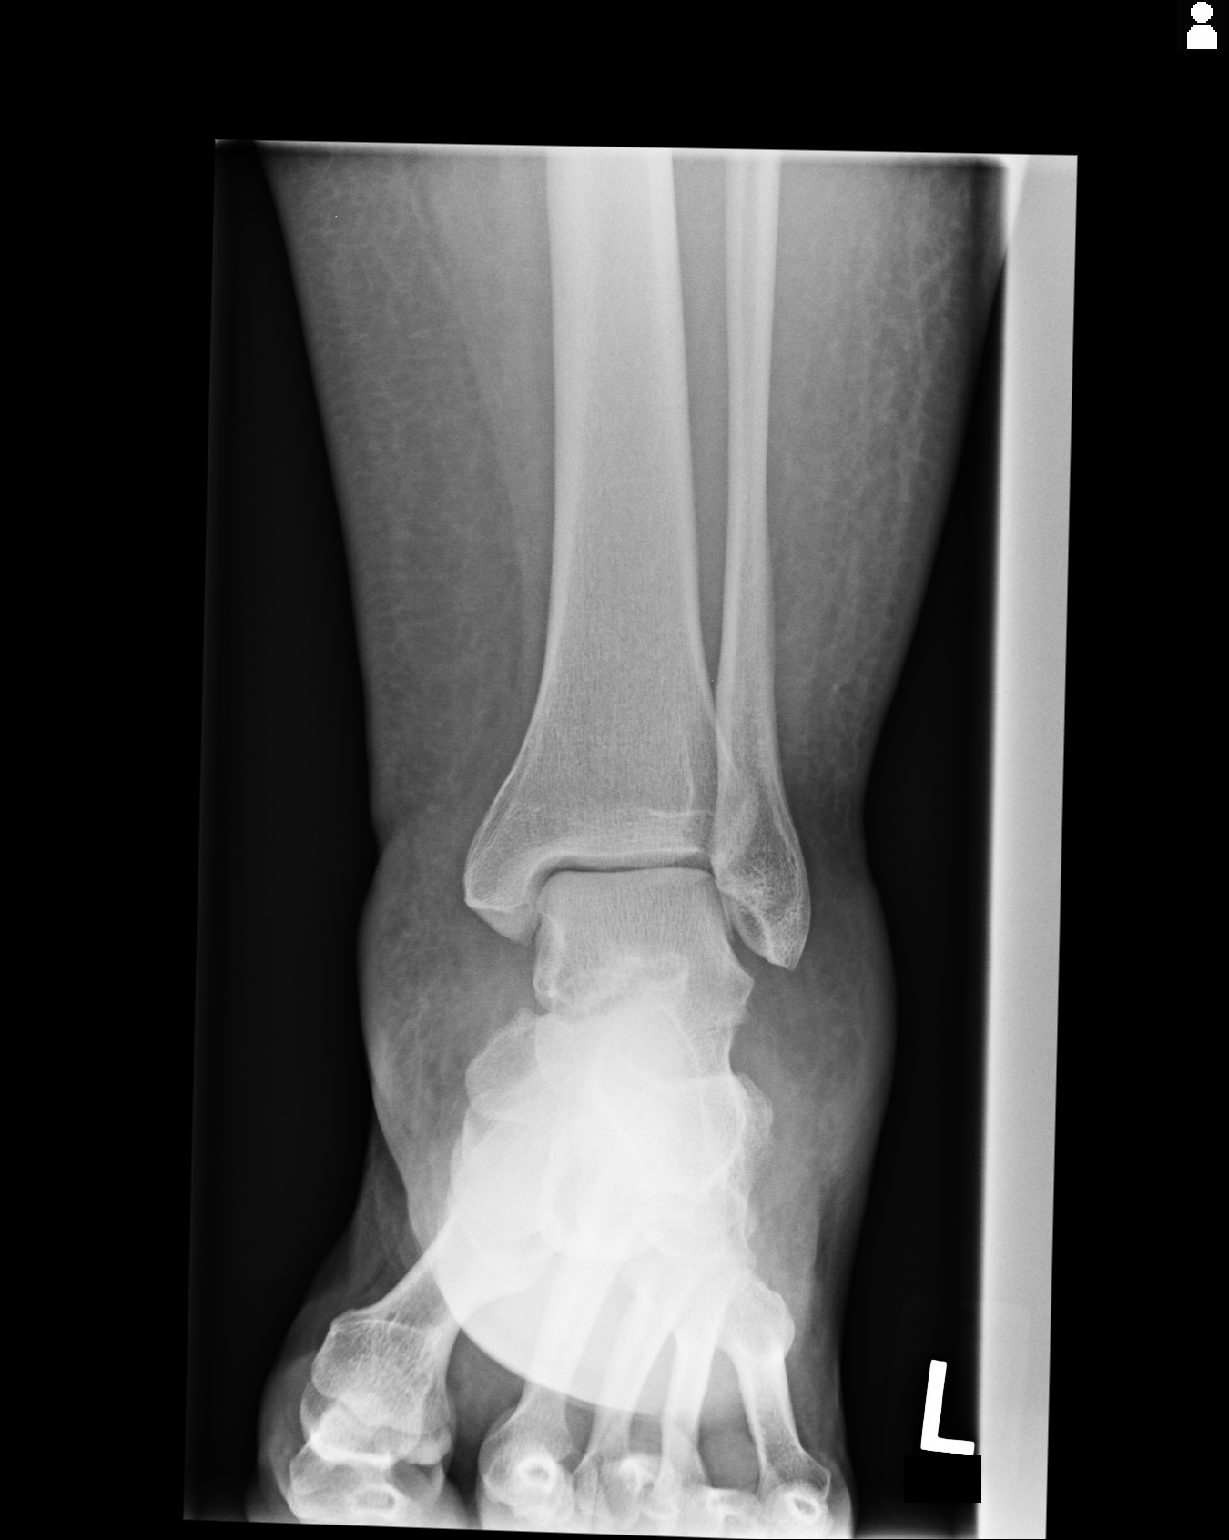
[im 2/3]
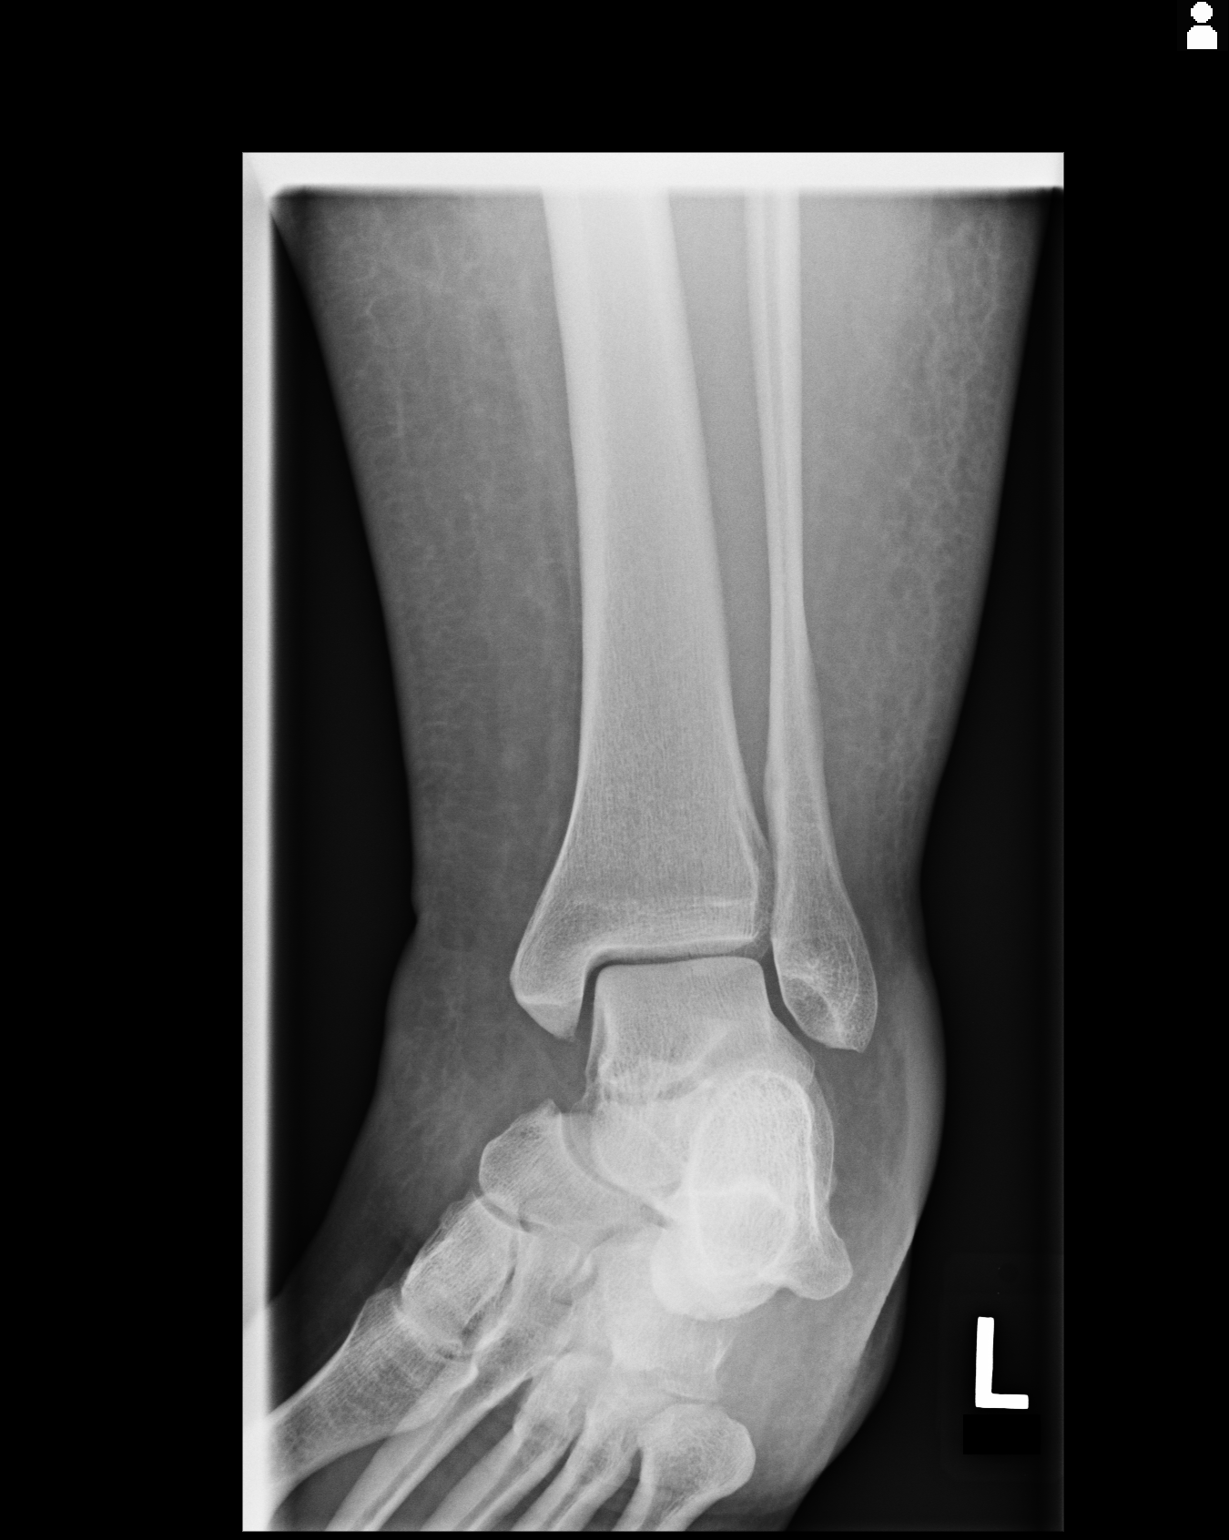
[im 3/3]
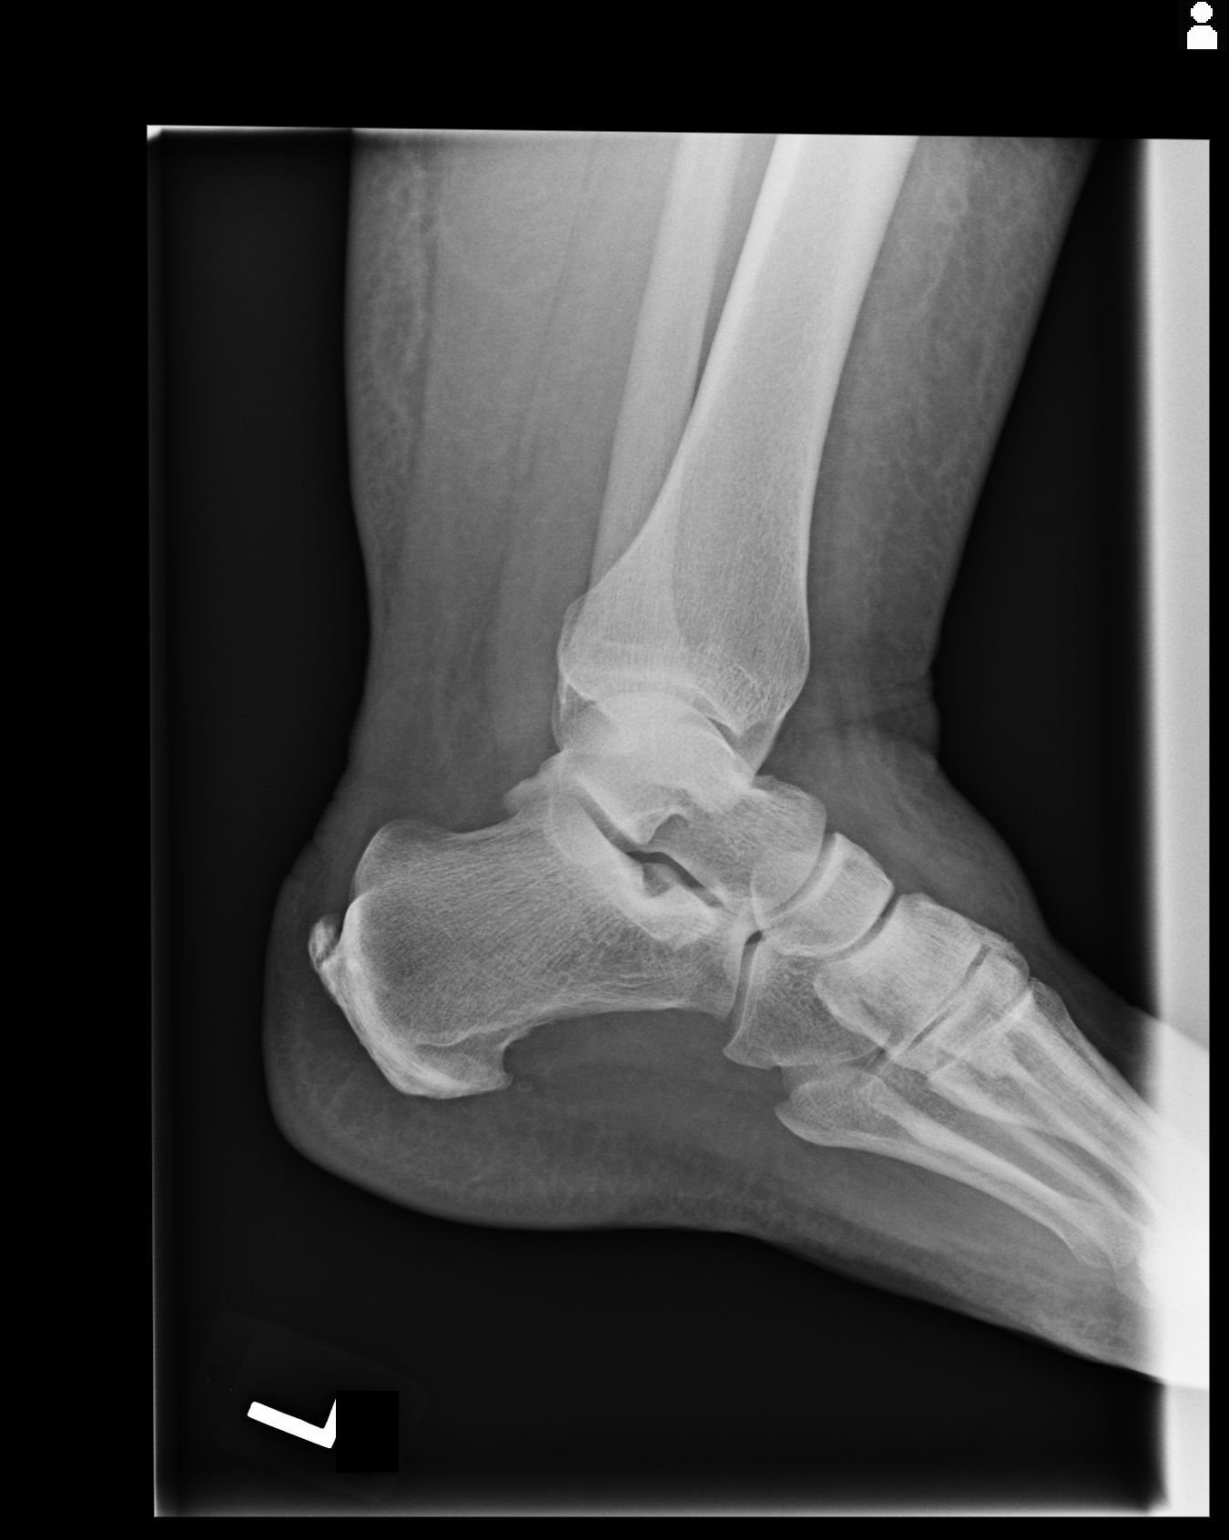

[3 of 3 positions shown; findings below may reference images not displayed]

FINDINGS: Diffuse soft tissue swelling. Moderate size calcaneal spurs. No
fracture, dislocation or effusion seen.
IMPRESSION: No fracture.  Moderate size calcaneal spurs.

## 2013-05-15 LAB — URINE CULTURE

## 2013-09-08 ENCOUNTER — Ambulatory Visit: Payer: Self-pay | Admitting: Physician Assistant

## 2013-09-23 ENCOUNTER — Emergency Department: Payer: Self-pay | Admitting: Emergency Medicine

## 2013-09-23 LAB — PREGNANCY, URINE: Pregnancy Test, Urine: NEGATIVE m[IU]/mL

## 2013-09-25 ENCOUNTER — Emergency Department: Payer: Self-pay | Admitting: Emergency Medicine

## 2013-12-20 DIAGNOSIS — E88819 Insulin resistance, unspecified: Secondary | ICD-10-CM | POA: Insufficient documentation

## 2013-12-20 DIAGNOSIS — E041 Nontoxic single thyroid nodule: Secondary | ICD-10-CM | POA: Insufficient documentation

## 2013-12-20 DIAGNOSIS — E8881 Metabolic syndrome: Secondary | ICD-10-CM | POA: Insufficient documentation

## 2014-01-02 ENCOUNTER — Emergency Department: Payer: Self-pay | Admitting: Emergency Medicine

## 2014-01-03 LAB — COMPREHENSIVE METABOLIC PANEL
Albumin: 3 g/dL — ABNORMAL LOW (ref 3.4–5.0)
Alkaline Phosphatase: 70 U/L
Anion Gap: 4 — ABNORMAL LOW (ref 7–16)
BUN: 11 mg/dL (ref 7–18)
Bilirubin,Total: 0.2 mg/dL (ref 0.2–1.0)
Calcium, Total: 8.6 mg/dL (ref 8.5–10.1)
Chloride: 106 mmol/L (ref 98–107)
Co2: 29 mmol/L (ref 21–32)
Creatinine: 0.79 mg/dL (ref 0.60–1.30)
EGFR (African American): >60
EGFR (Non-African Amer.): >60
Glucose: 119 mg/dL — ABNORMAL HIGH (ref 65–99)
Osmolality: 278 (ref 275–301)
Potassium: 4.1 mmol/L (ref 3.5–5.1)
SGOT(AST): 26 U/L (ref 15–37)
SGPT (ALT): 49 U/L (ref 12–78)
Sodium: 139 mmol/L (ref 136–145)
Total Protein: 6.8 g/dL (ref 6.4–8.2)

## 2014-01-03 LAB — CBC WITH DIFFERENTIAL/PLATELET
Basophil #: 0.1 10*3/uL (ref 0.0–0.1)
Basophil %: 0.8 %
Eosinophil #: 0.5 10*3/uL (ref 0.0–0.7)
Eosinophil %: 4.6 %
HCT: 42.1 % (ref 35.0–47.0)
HGB: 13.6 g/dL (ref 12.0–16.0)
Lymphocyte #: 3.4 10*3/uL (ref 1.0–3.6)
Lymphocyte %: 32.7 %
MCH: 27.6 pg (ref 26.0–34.0)
MCHC: 32.4 g/dL (ref 32.0–36.0)
MCV: 85 fL (ref 80–100)
Monocyte #: 1 x10 3/mm — ABNORMAL HIGH (ref 0.2–0.9)
Monocyte %: 9.3 %
Neutrophil #: 5.5 10*3/uL (ref 1.4–6.5)
Neutrophil %: 52.6 %
Platelet: 323 10*3/uL (ref 150–440)
RBC: 4.93 10*6/uL (ref 3.80–5.20)
RDW: 13.6 % (ref 11.5–14.5)
WBC: 10.4 10*3/uL (ref 3.6–11.0)

## 2014-01-03 LAB — URINALYSIS, COMPLETE
Bilirubin,UR: NEGATIVE
Blood: NEGATIVE
Glucose,UR: NEGATIVE mg/dL (ref 0–75)
Ketone: NEGATIVE
Leukocyte Esterase: NEGATIVE
Nitrite: NEGATIVE
Ph: 6 (ref 4.5–8.0)
Protein: NEGATIVE
Specific Gravity: 1.023 (ref 1.003–1.030)

## 2014-01-03 LAB — PREGNANCY, URINE: Pregnancy Test, Urine: NEGATIVE m[IU]/mL

## 2014-01-03 IMAGING — CT CT ABD-PELV W/ CM
1 of 2 series · 15 of 32 positions shown, 19 images · IV contrast (isovue)
Comparison: Abdominal ultrasound on [DATE].

CLINICAL DATA: Right lower abdominal pain and right flank pain with
nausea.

EXAM:
CT ABDOMEN AND PELVIS WITH CONTRAST
TECHNIQUE: Multidetector CT imaging of the abdomen and pelvis was performed
using the standard protocol following bolus administration of
intravenous contrast.
CONTRAST:  125 mL Isovue 370 IV

[Series 2: routine abd pel with · axial · 0.95mm/px · z∈[-1086,-626]mm · 15 of 102 slices shown, 19 images]
[im 5/102  soft-tissue]
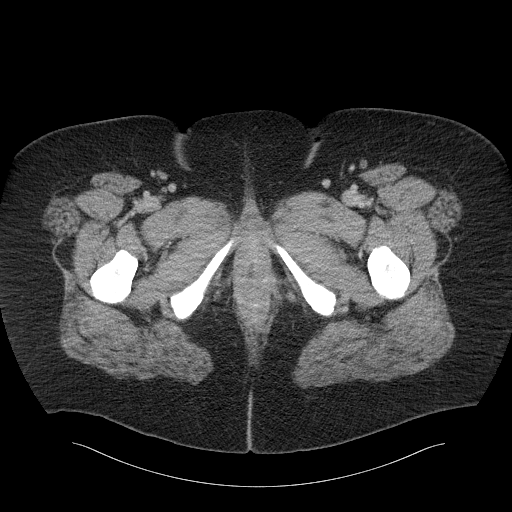
[im 5/102  bone]
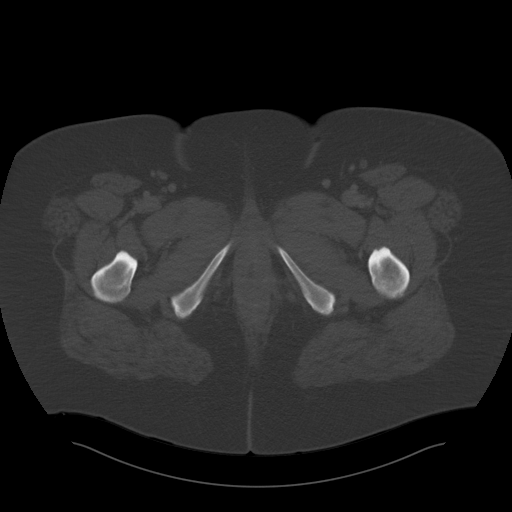
[im 13/102  soft-tissue]
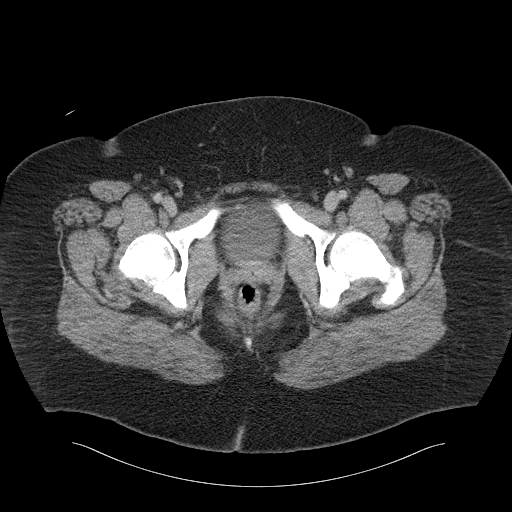
[im 22/102  soft-tissue]
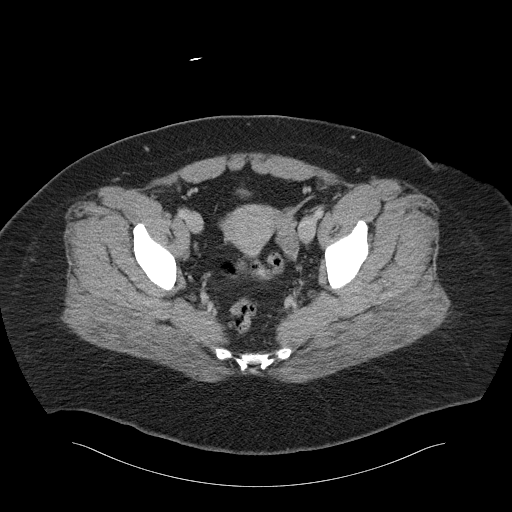
[im 30/102  soft-tissue]
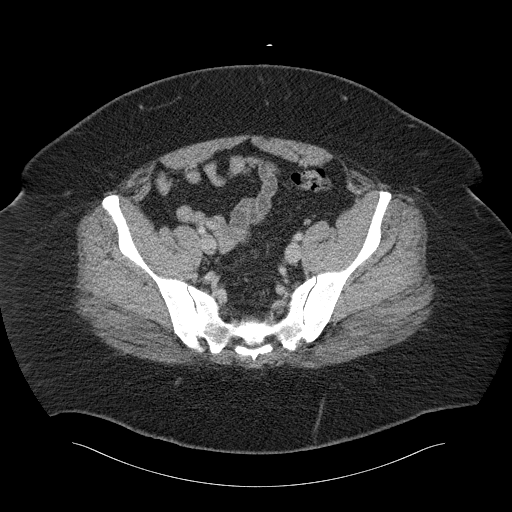
[im 34/102  soft-tissue]
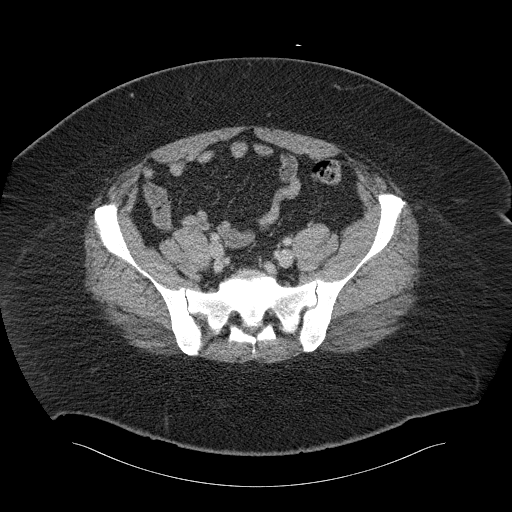
[im 43/102  soft-tissue]
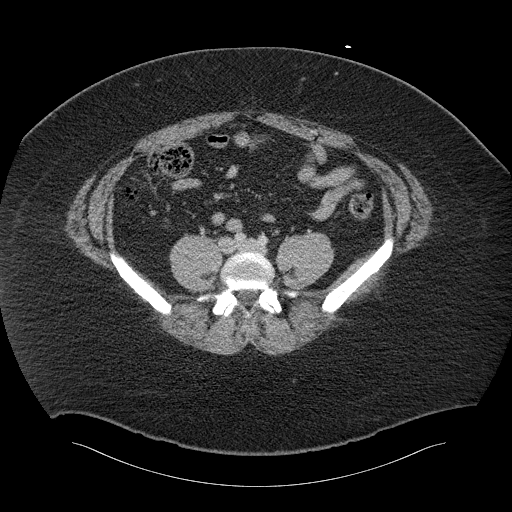
[im 51/102  soft-tissue]
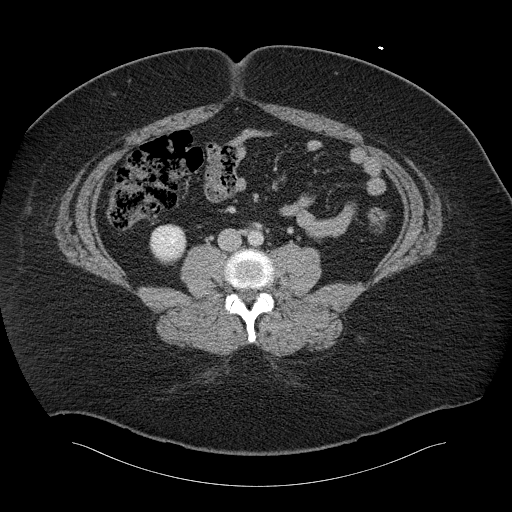
[im 59/102  soft-tissue]
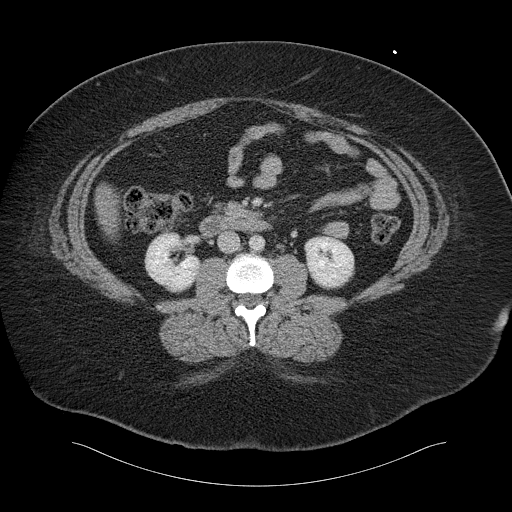
[im 68/102  soft-tissue]
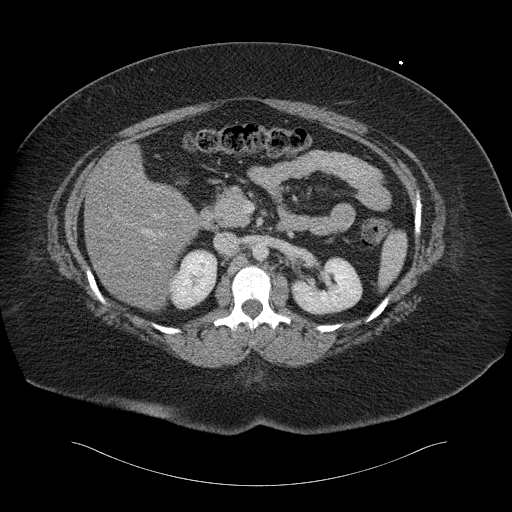
[im 68/102  bone]
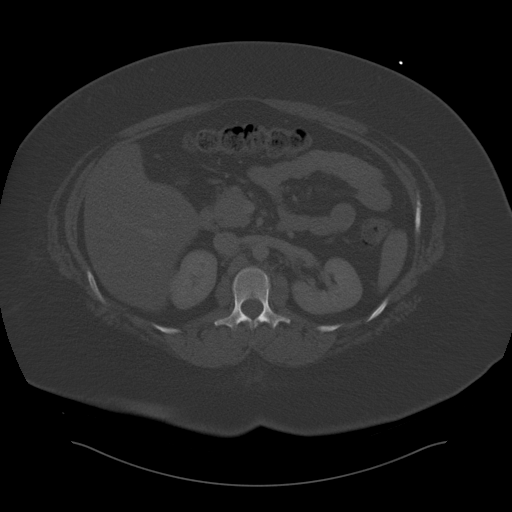
[im 72/102  soft-tissue]
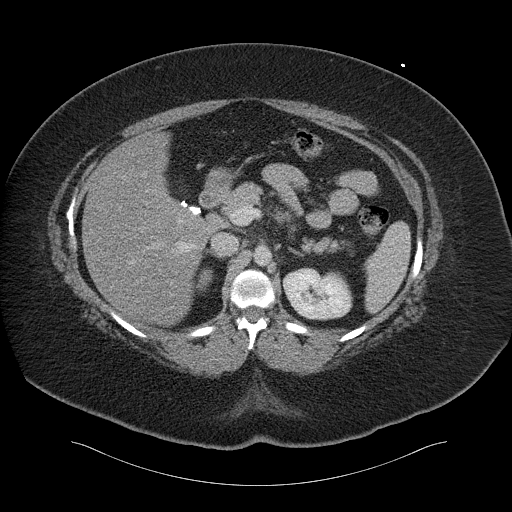
[im 80/102  soft-tissue]
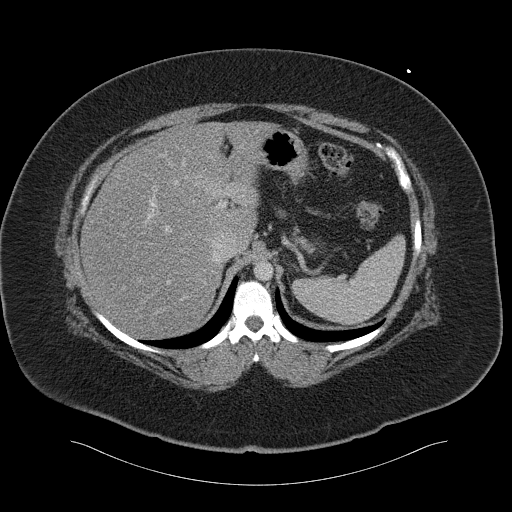
[im 85/102  lung]
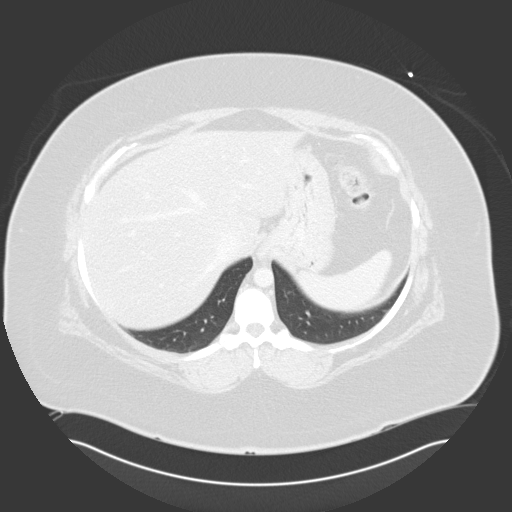
[im 89/102  soft-tissue]
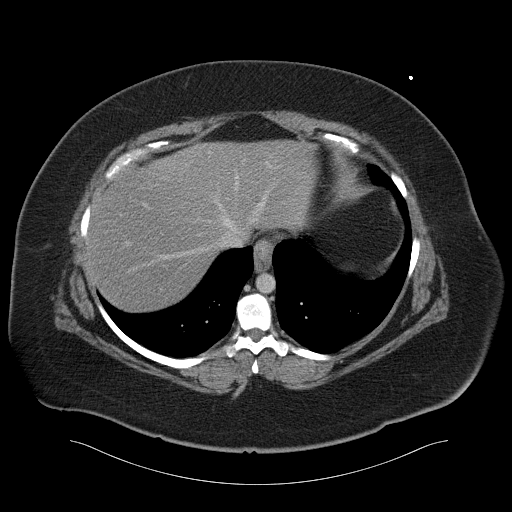
[im 89/102  lung]
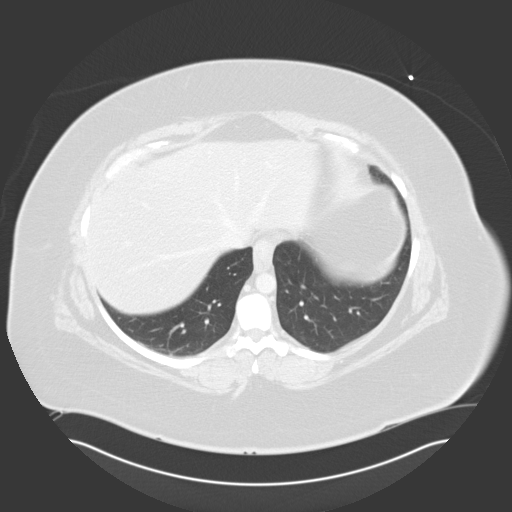
[im 93/102  lung]
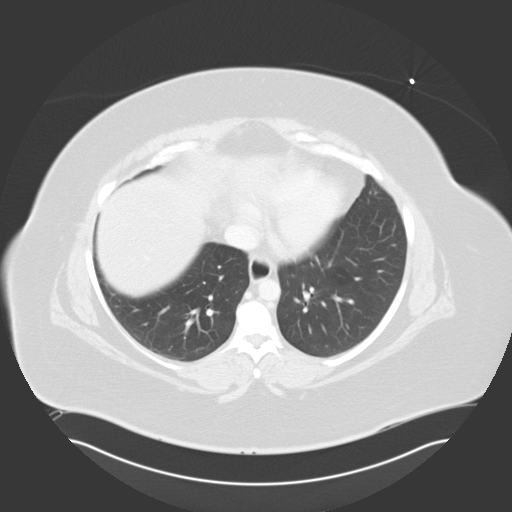
[im 97/102  soft-tissue]
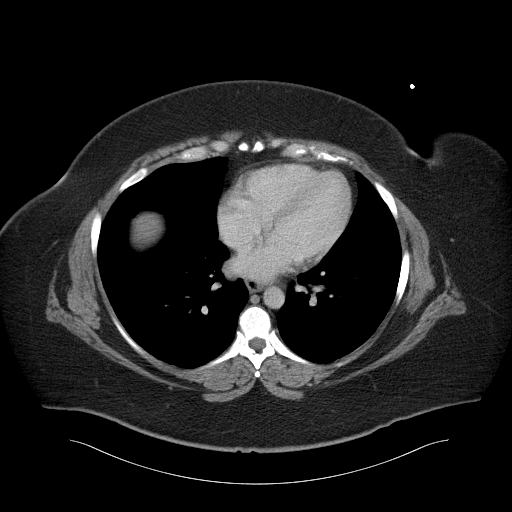
[im 97/102  lung]
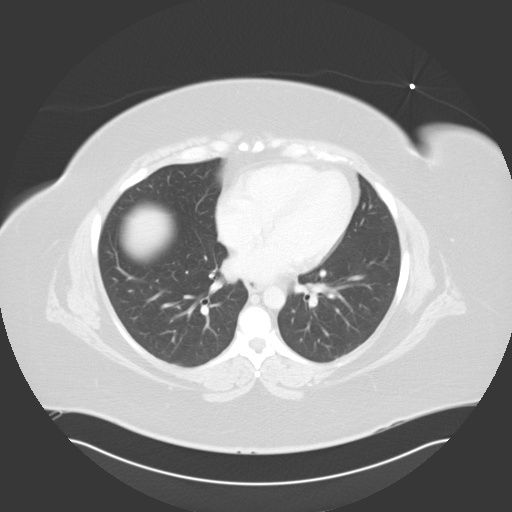

[15 of 32 positions shown; findings below may reference images not displayed]

FINDINGS: The liver shows fatty infiltration. The gallbladder has been
removed. No hepatic masses, cirrhotic changes or biliary dilatation.

The pancreas, spleen, adrenal glands and kidneys are within normal
limits. Bowel shows no evidence of obstruction or inflammation.
There is no evidence of appendicitis. No renal calculi or
obstruction.

The bladder is unremarkable. Uterus and adnexal regions are
unremarkable by CT. No hernias, masses or enlarged lymph nodes are
seen. No vascular abnormalities. Bony structures are unremarkable.
IMPRESSION: Hepatic steatosis without cirrhosis.  No acute findings.

## 2014-02-10 ENCOUNTER — Ambulatory Visit: Payer: Self-pay | Admitting: Gastroenterology

## 2014-02-10 IMAGING — RF DG BARIUM SWALLOW
7 of 8 series · 14 of 22 positions shown · non-contrast
Comparison: None.

CLINICAL DATA: Dysphagia; history of Barrett's esophagus

EXAM:
ESOPHOGRAM/BARIUM SWALLOW
TECHNIQUE: Combined double contrast and single contrast examination performed
using thick liquid barium and thin barium liquid. A 13 mm barium
tablet was also administered.
FLUOROSCOPY TIME:  1 min 54 seconds

[Series 1: fluoro_barium 2fps_bw · 0.17mm/px · 3 of 14 frames shown (1 of 6)]
[frame 3/14]
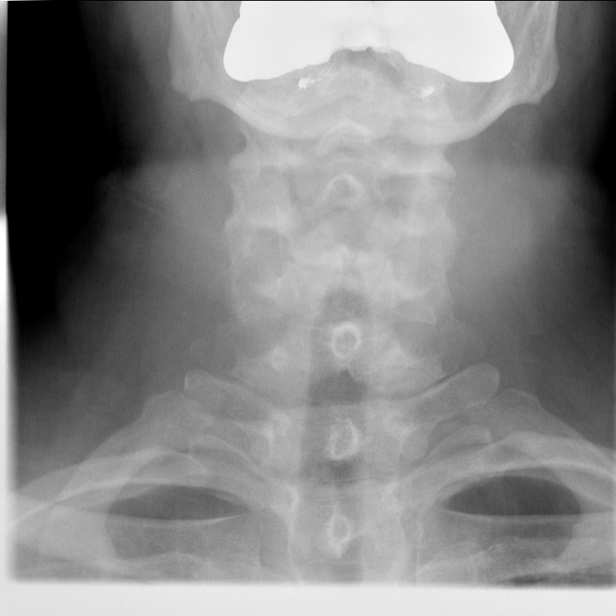
[frame 12/14]
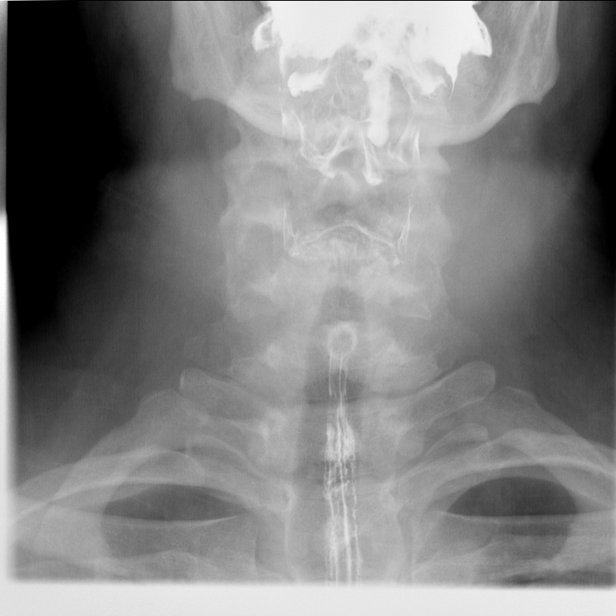
[frame 14/14]
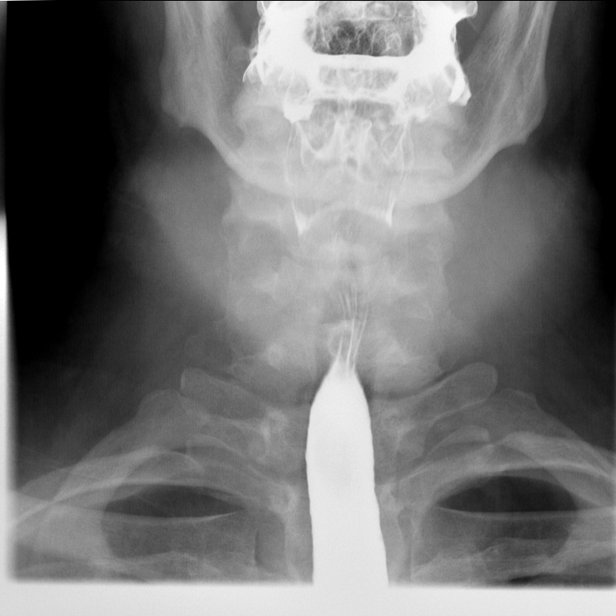

[Series 3: fluoro_barium 2fps_bw · 0.17mm/px · 1 of 1 slices shown (2 of 6)]
[im 1/1]
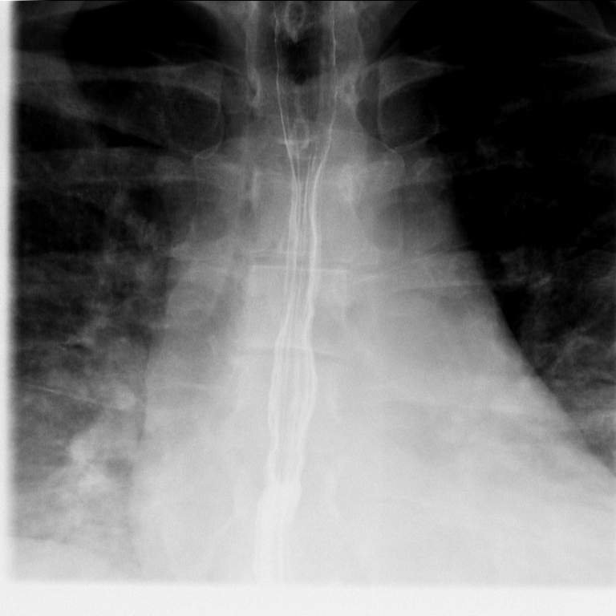

[Series 4: fluoro_barium 2fps_bw · 0.17mm/px · 1 of 2 frames shown (3 of 6)]
[frame 2/2]
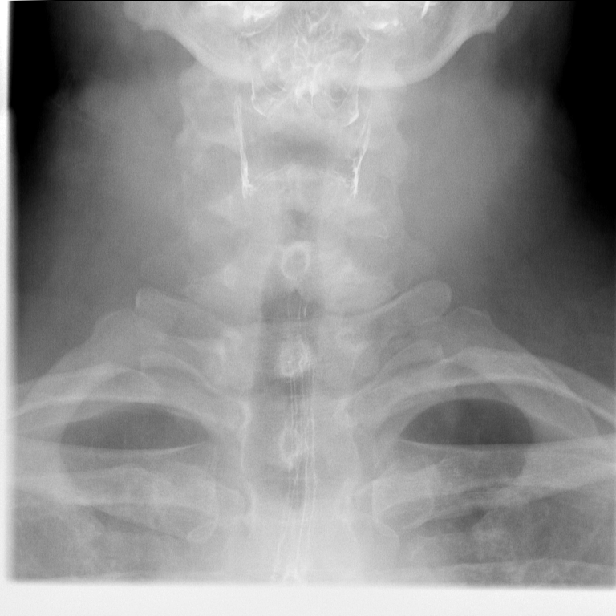

[Series 5: fluoro_barium 2fps_bw · 0.17mm/px · 2 of 5 frames shown (4 of 6)]
[frame 1/5]
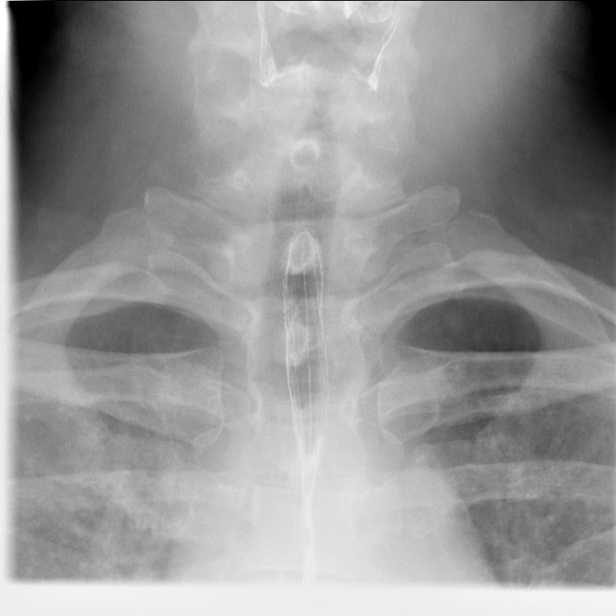
[frame 5/5]
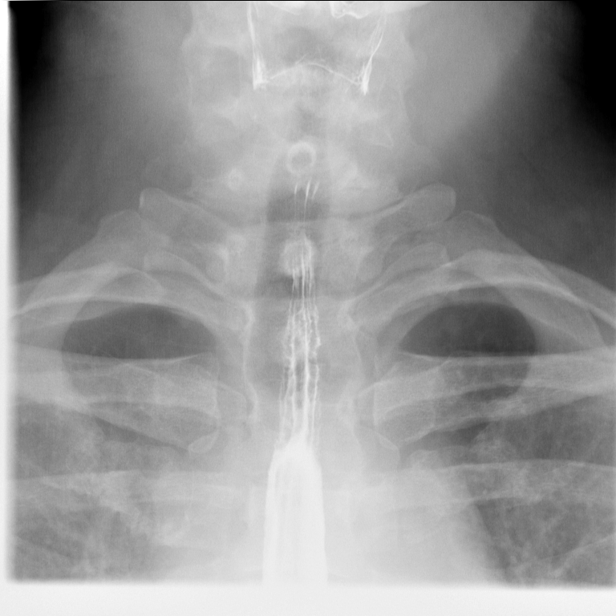

[Series 6: fluoro_barium 2fps_bw · 0.17mm/px · 1 of 2 frames shown (5 of 6)]
[frame 1/2]
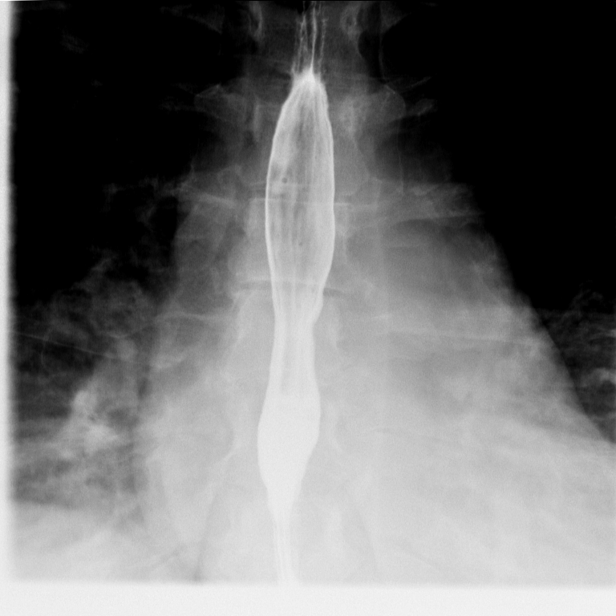

[Series 7: fluoro_barium 2fps_bw · 0.20mm/px · 1 of 1 slices shown (6 of 6)]
[im 1/1]
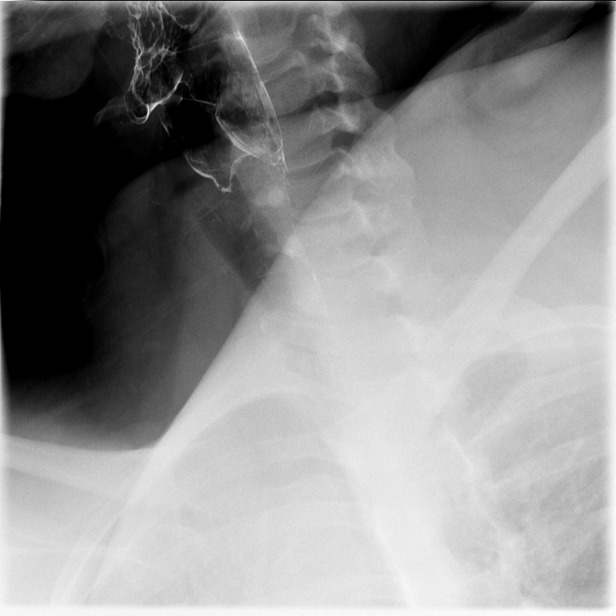

[Series 8: fluoro_barium singleshot_bw · 0.20mm/px · 5 of 8 slices shown]
[im 1/8]
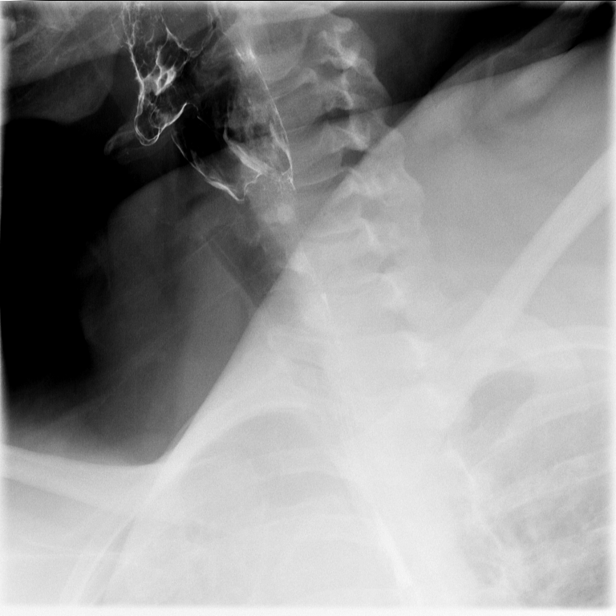
[im 3/8]
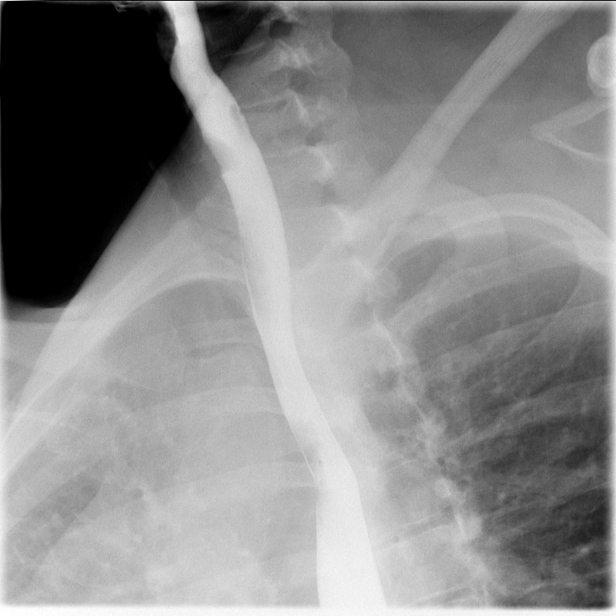
[im 5/8]
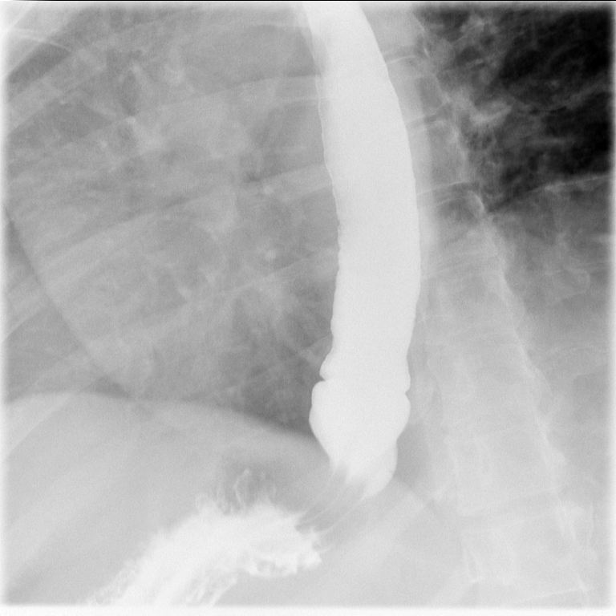
[im 6/8]
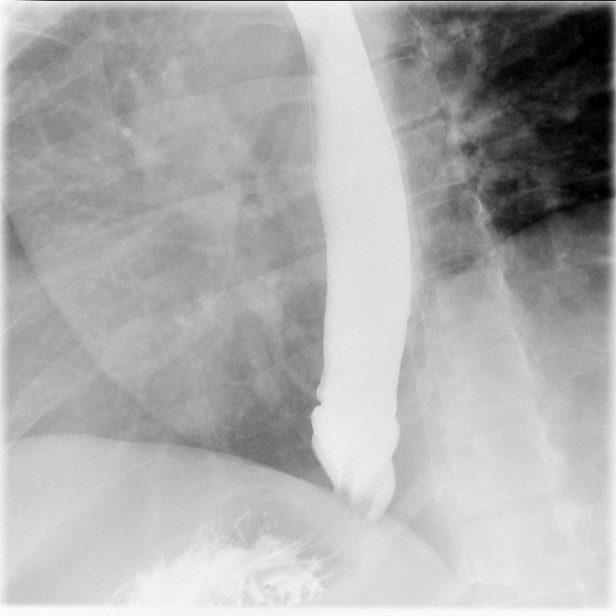
[im 8/8]
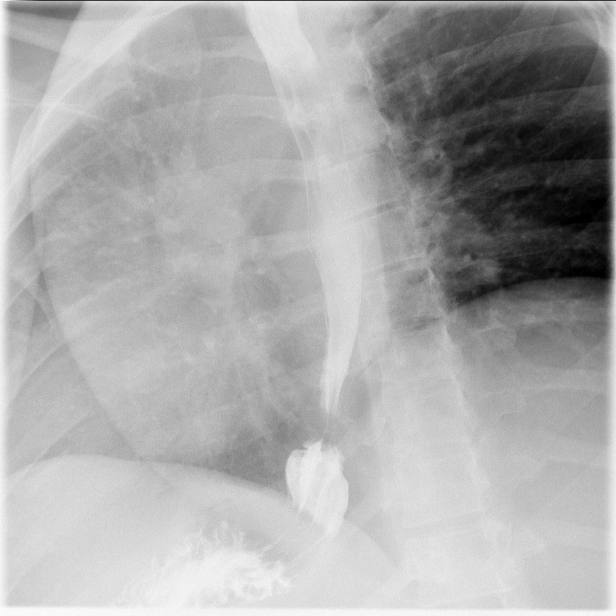

[14 of 22 positions shown; findings below may reference images not displayed]

FINDINGS: The patient swallows readily. There is esophageal dysmotility with
diminished primary peristalsis and intermittent tertiary
contractions. There is a small hiatal hernia with a small distal
Schatzki ring. There is only minimal narrowing at the ring. There is
diffuse reflux of a full column of barium to the upper esophagus
which is slowed empty. There is no stricture, mass, or ulceration.
Pharynx appears normal. 13 mm barium tablet passed freely to the
level of the hiatal hernia. The tablet remained at the hiatal hernia
for approximately 30 seconds and passed into the stomach after
several boluses of water.
IMPRESSION: Esophageal dysmotility. Small hiatal hernia with a small distal
Schatzki ring. Diffuse gastroesophageal reflux. No esophageal mass
or ulceration. No stricture beyond some slight narrowing at the
Schatzki ring.

## 2014-03-04 ENCOUNTER — Ambulatory Visit: Payer: Self-pay | Admitting: Gastroenterology

## 2014-03-10 LAB — PATHOLOGY REPORT

## 2014-04-09 ENCOUNTER — Ambulatory Visit: Payer: Self-pay | Admitting: Physician Assistant

## 2014-04-09 LAB — RAPID STREP-A WITH REFLX: Micro Text Report: NEGATIVE

## 2014-04-12 LAB — BETA STREP CULTURE(ARMC)

## 2014-05-10 ENCOUNTER — Ambulatory Visit: Payer: Self-pay | Admitting: Emergency Medicine

## 2014-05-10 LAB — PREGNANCY, URINE: Pregnancy Test, Urine: NEGATIVE m[IU]/mL

## 2014-05-10 IMAGING — CR DG CHEST 2V
2 series · 2 of 2 positions shown · non-contrast
Comparison: [DATE]

CLINICAL DATA: Cough and short of breath.

EXAM:
CHEST  2 VIEW

[chest pa]
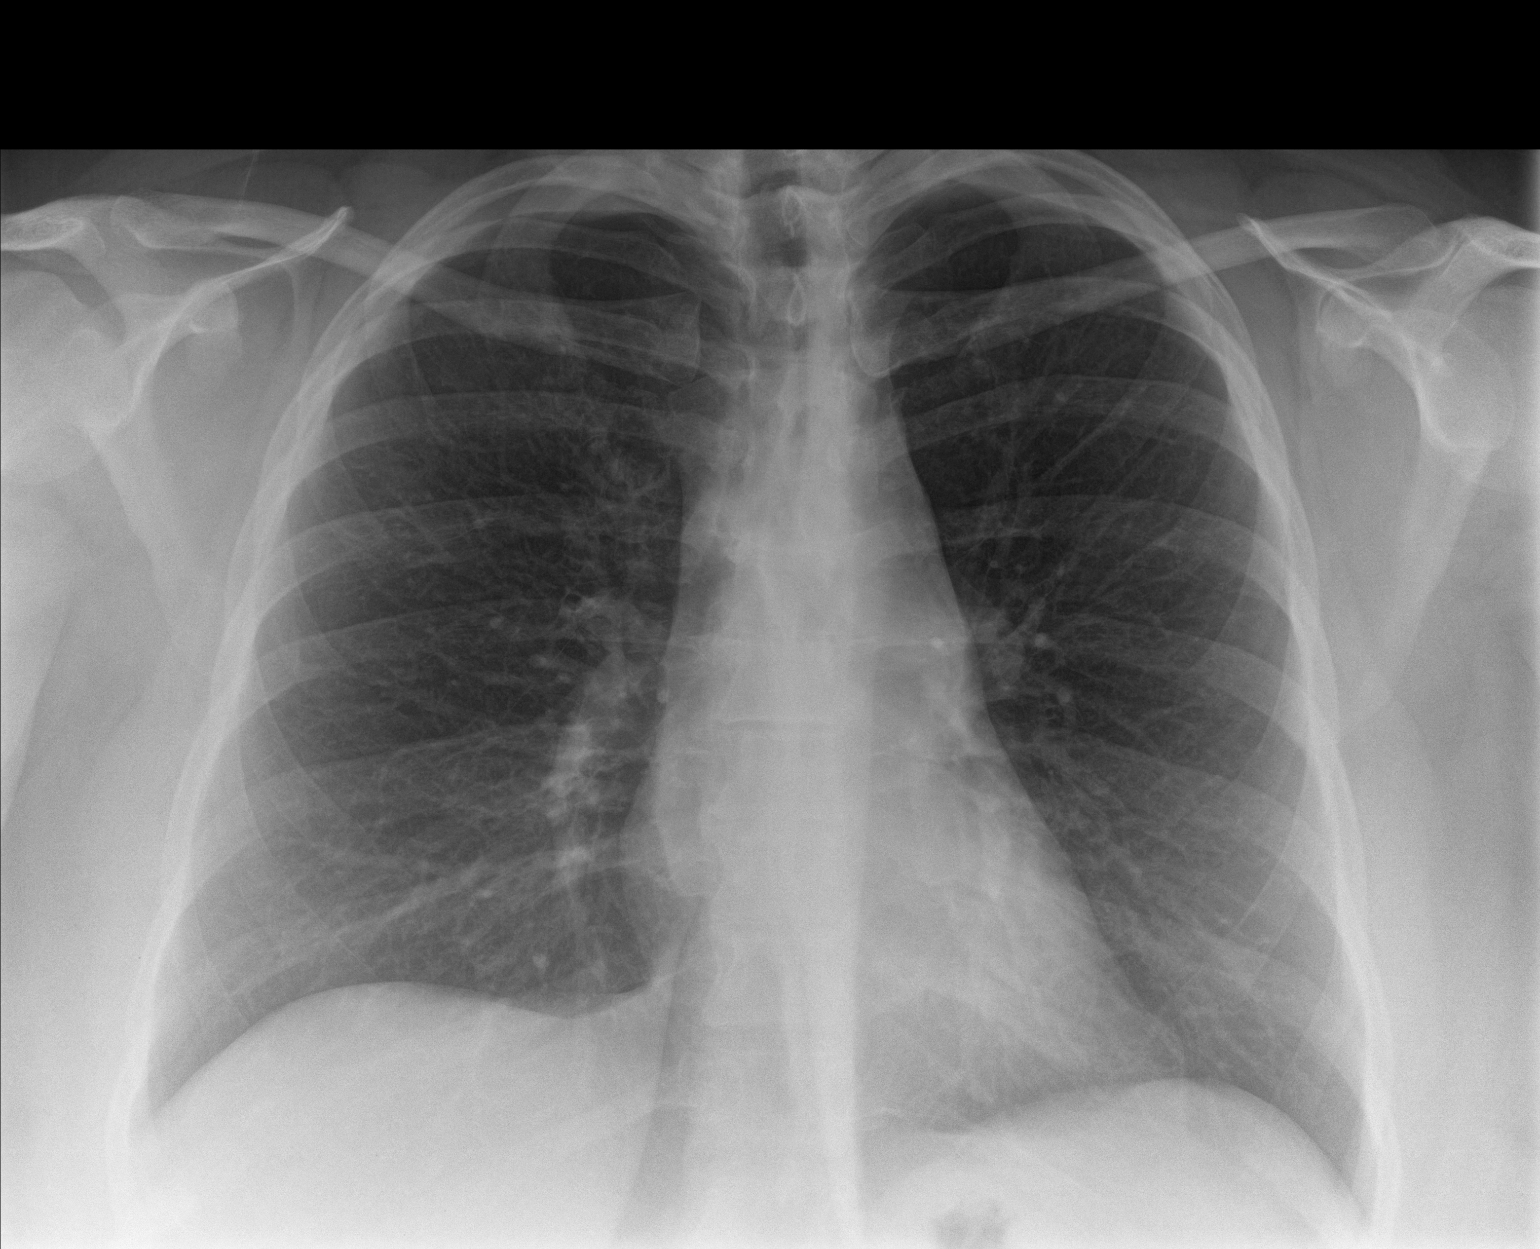

[chest lat]
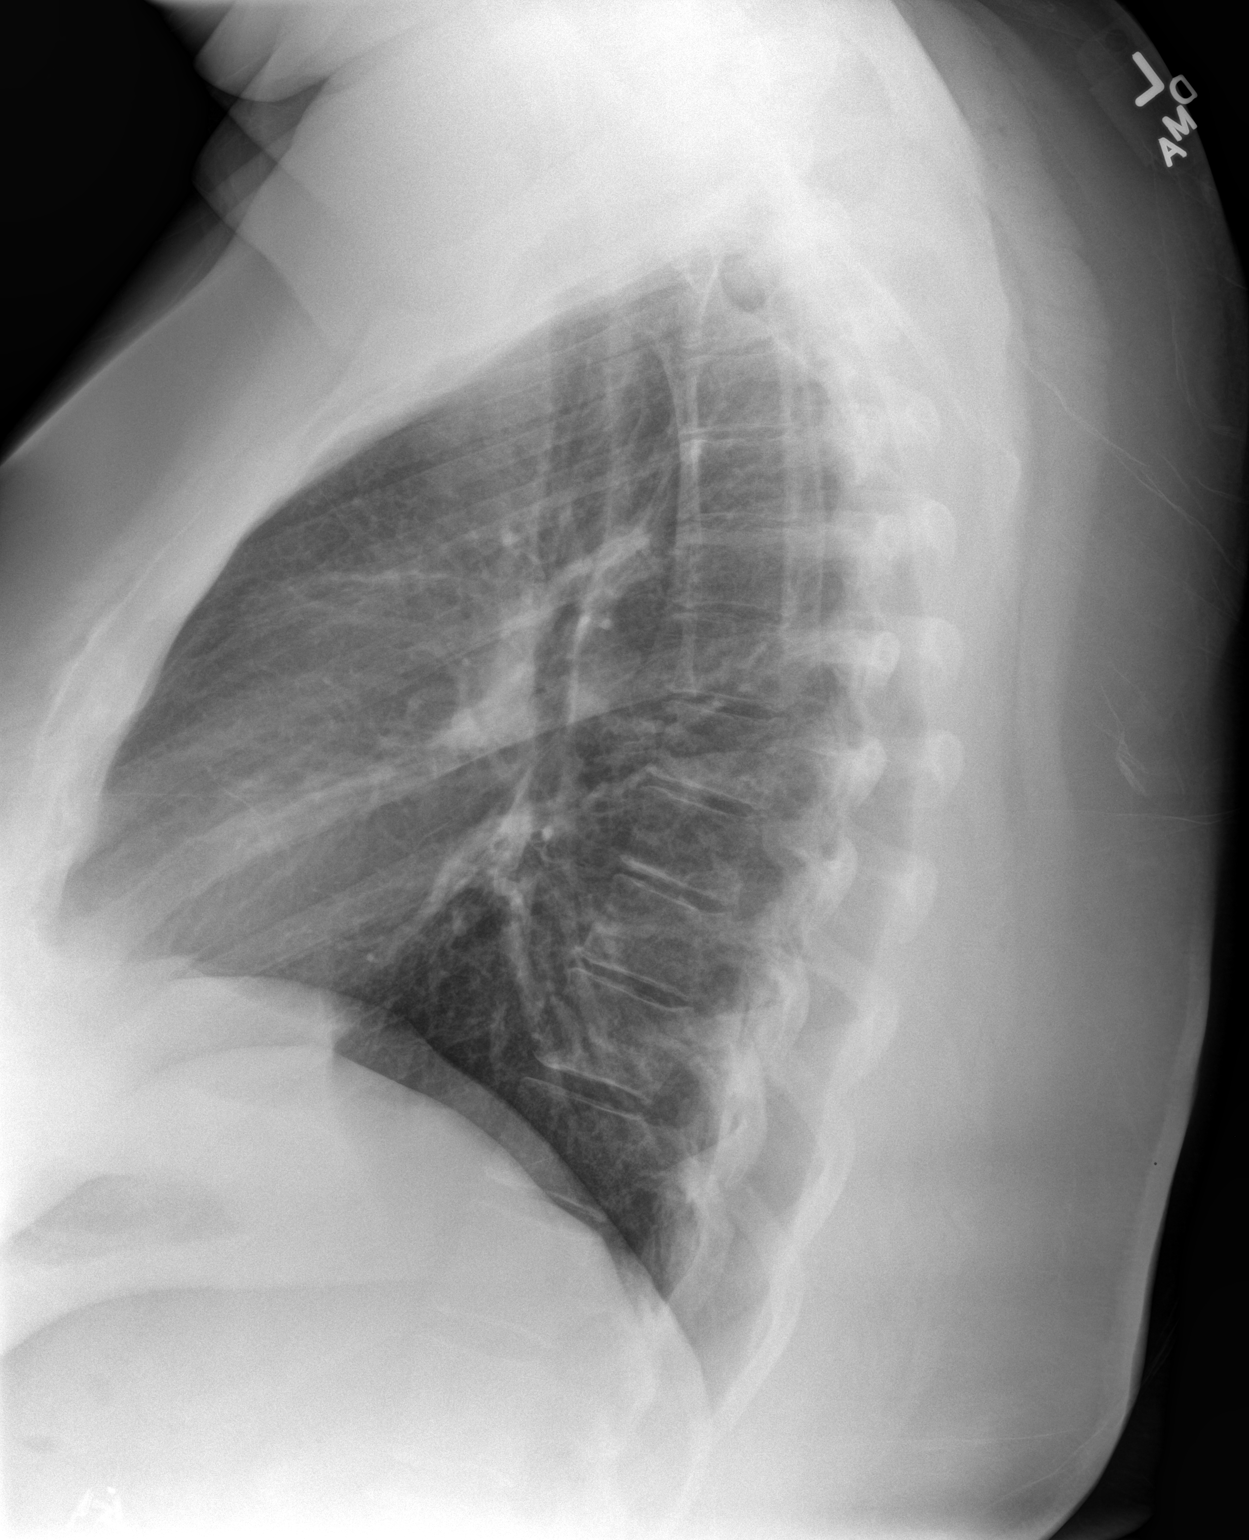

[2 of 2 positions shown; findings below may reference images not displayed]

FINDINGS: The heart size and mediastinal contours are within normal limits.
Both lungs are clear. The visualized skeletal structures are
unremarkable.
IMPRESSION: No active cardiopulmonary disease.

## 2014-08-09 DIAGNOSIS — J4520 Mild intermittent asthma, uncomplicated: Secondary | ICD-10-CM | POA: Insufficient documentation

## 2014-08-21 ENCOUNTER — Ambulatory Visit: Payer: Self-pay | Admitting: Emergency Medicine

## 2014-08-27 ENCOUNTER — Ambulatory Visit: Payer: Self-pay | Admitting: Family Medicine

## 2014-08-29 ENCOUNTER — Ambulatory Visit: Payer: Self-pay | Admitting: Physician Assistant

## 2014-08-30 HISTORY — PX: LAPAROSCOPIC GASTRIC BYPASS: SUR771

## 2014-08-30 HISTORY — PX: GASTRIC BYPASS: SHX52

## 2014-09-08 DIAGNOSIS — Z9884 Bariatric surgery status: Secondary | ICD-10-CM

## 2014-09-08 HISTORY — DX: Bariatric surgery status: Z98.84

## 2014-09-27 DIAGNOSIS — Z9884 Bariatric surgery status: Secondary | ICD-10-CM

## 2014-11-17 ENCOUNTER — Encounter: Payer: Self-pay | Admitting: *Deleted

## 2014-11-17 ENCOUNTER — Other Ambulatory Visit: Payer: Self-pay

## 2014-11-17 ENCOUNTER — Inpatient Hospital Stay: Admission: RE | Admit: 2014-11-17 | Payer: Self-pay | Source: Ambulatory Visit

## 2014-11-17 DIAGNOSIS — L723 Sebaceous cyst: Secondary | ICD-10-CM | POA: Diagnosis not present

## 2014-11-17 DIAGNOSIS — G4733 Obstructive sleep apnea (adult) (pediatric): Secondary | ICD-10-CM | POA: Diagnosis not present

## 2014-11-17 DIAGNOSIS — Z888 Allergy status to other drugs, medicaments and biological substances status: Secondary | ICD-10-CM | POA: Diagnosis not present

## 2014-11-17 DIAGNOSIS — Z885 Allergy status to narcotic agent status: Secondary | ICD-10-CM | POA: Diagnosis not present

## 2014-11-17 DIAGNOSIS — F329 Major depressive disorder, single episode, unspecified: Secondary | ICD-10-CM | POA: Diagnosis not present

## 2014-11-17 DIAGNOSIS — Z79899 Other long term (current) drug therapy: Secondary | ICD-10-CM | POA: Diagnosis not present

## 2014-11-17 DIAGNOSIS — Z9884 Bariatric surgery status: Secondary | ICD-10-CM | POA: Diagnosis not present

## 2014-11-17 NOTE — Patient Instructions (Signed)
  Your procedure is scheduled on: 11-24-14 Report to [facility] Desk 2nd Floor To find out your arrival time please call [phone] between 1PM - 3PM on 11-23-14  Remember: Instructions that are not followed completely may result in serious medical risk, up to and including death, or upon the discretion of your surgeon and anesthesiologist your surgery may need to be rescheduled.    __x__ 1. Do not eat food or drink liquids after midnight. No gum chewing or hard candies.     __x__ 2. No Alcohol for 24 hours before or after surgery.   ____ 3. Bring all medications with you on the day of surgery if instructed.    __x__ 4. Notify your doctor if there is any change in your medical condition     (cold, fever, infections).     Do not wear jewelry, make-up, hairpins, clips or nail polish.  Do not wear lotions, powders, or perfumes. You may wear deodorant.  Do not shave 48 hours prior to surgery. Men may shave face and neck.  Do not bring valuables to the hospital.    [facility] is not responsible for any belongings or valuables.               Contacts, dentures or bridgework may not be worn into surgery.  Leave your suitcase in the car. After surgery it may be brought to your room.  For patients admitted to the hospital, discharge time is determined by your treatment team.   Patients discharged the day of surgery will not be allowed to drive home.   Please read over the following fact sheets that you were given:      __x__ Take these medicines the morning of surgery with A SIP OF WATER:    1. Protonix  2. Celexa  3. Bring Albuterol Inhaler to hospital  4.  5.  6.  ____ Fleet Enema (as directed)   ____ Use CHG Soap as directed  ____ Use inhalers on the day of surgery  ____ Stop metformin 2 days prior to surgery    ____ Take 1/2 of usual insulin dose the night before surgery and none on the morning of surgery.   ____ Stop Coumadin/Plavix/aspirin  on  ____ Stop Anti-inflammatories -Tylenol ok to use   ____ Stop supplements until after surgery.    __x__ Bring C-Pap to the hospital.

## 2014-11-24 ENCOUNTER — Encounter: Payer: Self-pay | Admitting: Anesthesiology

## 2014-11-24 ENCOUNTER — Ambulatory Visit
Admission: RE | Admit: 2014-11-24 | Discharge: 2014-11-24 | Disposition: A | Discharge status: Home / Self Care | Payer: BLUE CROSS/BLUE SHIELD | Source: Ambulatory Visit | Attending: Otolaryngology | Admitting: Otolaryngology

## 2014-11-24 ENCOUNTER — Ambulatory Visit: Payer: BLUE CROSS/BLUE SHIELD | Admitting: Anesthesiology

## 2014-11-24 ENCOUNTER — Encounter
Admission: RE | Disposition: A | Discharge status: Home / Self Care | Payer: Self-pay | Source: Ambulatory Visit | Attending: Otolaryngology

## 2014-11-24 DIAGNOSIS — Z888 Allergy status to other drugs, medicaments and biological substances status: Secondary | ICD-10-CM | POA: Insufficient documentation

## 2014-11-24 DIAGNOSIS — Z885 Allergy status to narcotic agent status: Secondary | ICD-10-CM | POA: Insufficient documentation

## 2014-11-24 DIAGNOSIS — Z9884 Bariatric surgery status: Secondary | ICD-10-CM | POA: Insufficient documentation

## 2014-11-24 DIAGNOSIS — L723 Sebaceous cyst: Secondary | ICD-10-CM | POA: Diagnosis not present

## 2014-11-24 DIAGNOSIS — Z79899 Other long term (current) drug therapy: Secondary | ICD-10-CM | POA: Insufficient documentation

## 2014-11-24 DIAGNOSIS — F329 Major depressive disorder, single episode, unspecified: Secondary | ICD-10-CM | POA: Insufficient documentation

## 2014-11-24 DIAGNOSIS — G4733 Obstructive sleep apnea (adult) (pediatric): Secondary | ICD-10-CM | POA: Insufficient documentation

## 2014-11-24 HISTORY — DX: Personal history of other diseases of the digestive system: Z87.19

## 2014-11-24 HISTORY — PX: EAR CYST EXCISION: SHX22

## 2014-11-24 HISTORY — DX: Other complications of anesthesia, initial encounter: T88.59XA

## 2014-11-24 HISTORY — DX: Adverse effect of unspecified anesthetic, initial encounter: T41.45XA

## 2014-11-24 HISTORY — DX: Depression, unspecified: F32.A

## 2014-11-24 HISTORY — DX: Unspecified asthma, uncomplicated: J45.909

## 2014-11-24 HISTORY — DX: Sleep apnea, unspecified: G47.30

## 2014-11-24 HISTORY — DX: Major depressive disorder, single episode, unspecified: F32.9

## 2014-11-24 HISTORY — DX: Other specified postprocedural states: Z98.890

## 2014-11-24 HISTORY — DX: Barrett's esophagus without dysplasia: K22.70

## 2014-11-24 HISTORY — DX: Headache: R51

## 2014-11-24 HISTORY — DX: Gastro-esophageal reflux disease without esophagitis: K21.9

## 2014-11-24 HISTORY — DX: Headache, unspecified: R51.9

## 2014-11-24 HISTORY — DX: Nausea with vomiting, unspecified: R11.2

## 2014-11-24 LAB — POCT PREGNANCY, URINE: Preg Test, Ur: NEGATIVE

## 2014-11-24 SURGERY — CYST REMOVAL
Anesthesia: General | Site: Face | Wound class: Clean Contaminated

## 2014-11-24 MED ORDER — BACITRACIN ZINC 500 UNIT/GM EX OINT
TOPICAL_OINTMENT | CUTANEOUS | Status: AC
  Filled 2014-11-24: qty 28.35

## 2014-11-24 MED ORDER — MIDAZOLAM HCL 2 MG/2ML IJ SOLN
INTRAMUSCULAR | Status: DC | PRN
  Administered 2014-11-24: 2 mg via INTRAVENOUS

## 2014-11-24 MED ORDER — LIDOCAINE-EPINEPHRINE 1 %-1:100000 IJ SOLN
INTRAMUSCULAR | Status: AC
  Filled 2014-11-24: qty 1

## 2014-11-24 MED ORDER — FENTANYL CITRATE (PF) 100 MCG/2ML IJ SOLN
INTRAMUSCULAR | Status: DC | PRN
  Administered 2014-11-24: 50 ug via INTRAVENOUS
  Administered 2014-11-24: 100 ug via INTRAVENOUS

## 2014-11-24 MED ORDER — FENTANYL CITRATE (PF) 100 MCG/2ML IJ SOLN
25.0000 ug | INTRAMUSCULAR | Status: AC | PRN
  Administered 2014-11-24 (×6): 25 ug via INTRAVENOUS

## 2014-11-24 MED ORDER — DEXAMETHASONE SODIUM PHOSPHATE 10 MG/ML IJ SOLN
INTRAMUSCULAR | Status: AC
  Filled 2014-11-24: qty 1

## 2014-11-24 MED ORDER — FENTANYL CITRATE (PF) 100 MCG/2ML IJ SOLN
INTRAMUSCULAR | Status: AC
  Filled 2014-11-24: qty 2

## 2014-11-24 MED ORDER — ONDANSETRON HCL 4 MG/2ML IJ SOLN
INTRAMUSCULAR | Status: DC | PRN
  Administered 2014-11-24: 4 mg via INTRAVENOUS

## 2014-11-24 MED ORDER — ROCURONIUM BROMIDE 100 MG/10ML IV SOLN
INTRAVENOUS | Status: DC | PRN
  Administered 2014-11-24: 30 mg via INTRAVENOUS

## 2014-11-24 MED ORDER — LACTATED RINGERS IV SOLN
INTRAVENOUS | Status: DC
  Administered 2014-11-24 (×2): via INTRAVENOUS

## 2014-11-24 MED ORDER — OXYCODONE-ACETAMINOPHEN 5-325 MG PO TABS
ORAL_TABLET | ORAL | Status: AC
  Filled 2014-11-24: qty 1

## 2014-11-24 MED ORDER — PROMETHAZINE HCL 12.5 MG PO TABS: 12.5000 mg | ORAL_TABLET | Freq: Four times a day (QID) | ORAL | Status: DC | PRN

## 2014-11-24 MED ORDER — LIDOCAINE-EPINEPHRINE 1 %-1:100000 IJ SOLN
INTRAMUSCULAR | Status: DC | PRN
  Administered 2014-11-24: 3 mL

## 2014-11-24 MED ORDER — ONDANSETRON HCL 4 MG/2ML IJ SOLN
4.0000 mg | Freq: Once | INTRAMUSCULAR | Status: AC | PRN
  Administered 2014-11-24: 4 mg via INTRAVENOUS

## 2014-11-24 MED ORDER — PROPOFOL 10 MG/ML IV BOLUS
INTRAVENOUS | Status: DC | PRN
  Administered 2014-11-24: 200 mg via INTRAVENOUS

## 2014-11-24 MED ORDER — OXYCODONE-ACETAMINOPHEN 5-325 MG PO TABS
1.0000 | ORAL_TABLET | Freq: Once | ORAL | Status: AC
  Administered 2014-11-24: 1 via ORAL

## 2014-11-24 MED ORDER — ONDANSETRON HCL 4 MG/2ML IJ SOLN
INTRAMUSCULAR | Status: AC
  Filled 2014-11-24: qty 2

## 2014-11-24 SURGICAL SUPPLY — 29 items
BLADE SURG 15 STRL LF DISP TIS (BLADE) ×1 IMPLANT
BLADE SURG 15 STRL SS (BLADE) ×1
CANISTER SUCT 1200ML W/VALVE (MISCELLANEOUS) ×2 IMPLANT
CORD BIP STRL DISP 12FT (MISCELLANEOUS) ×2 IMPLANT
DRSG TEGADERM 2-3/8X2-3/4 SM (GAUZE/BANDAGES/DRESSINGS) IMPLANT
ELECT CAUTERY BLADE TIP 2.5 (TIP) ×2
ELECT NEEDLE 20X.3 GREEN (MISCELLANEOUS) ×2
ELECTRODE CAUTERY BLDE TIP 2.5 (TIP) ×1 IMPLANT
ELECTRODE NEEDLE 20X.3 GREEN (MISCELLANEOUS) ×1 IMPLANT
FORCEPS JEWEL BIP 4-3/4 STR (INSTRUMENTS) ×2 IMPLANT
GLOVE BIO SURGEON STRL SZ7.5 (GLOVE) ×4 IMPLANT
GOWN STRL REUS W/ TWL LRG LVL3 (GOWN DISPOSABLE) ×2 IMPLANT
GOWN STRL REUS W/TWL LRG LVL3 (GOWN DISPOSABLE) ×2
HARMONIC SCALPEL FOCUS (MISCELLANEOUS) IMPLANT
KIT RM TURNOVER STRD PROC AR (KITS) ×2 IMPLANT
LABEL OR SOLS (LABEL) ×2 IMPLANT
LIQUID BAND (GAUZE/BANDAGES/DRESSINGS) ×2 IMPLANT
NS IRRIG 500ML POUR BTL (IV SOLUTION) ×2 IMPLANT
PACK HEAD/NECK (MISCELLANEOUS) ×2 IMPLANT
PAD GROUND ADULT SPLIT (MISCELLANEOUS) ×2 IMPLANT
PROBE MONO 100X0.75 ELECT 1.9M (MISCELLANEOUS) IMPLANT
SPONGE KITTNER 5P (MISCELLANEOUS) ×2 IMPLANT
STRIP CLOSURE SKIN 1/4X4 (GAUZE/BANDAGES/DRESSINGS) IMPLANT
SUCTION FRAZIER TIP 10 FR DISP (SUCTIONS) ×2 IMPLANT
SUT CHROMIC 5 0 RB 1 27 (SUTURE) ×2 IMPLANT
SUT PROLENE 5 0 RB 1 DA (SUTURE) ×2 IMPLANT
SUT VIC AB 4-0 RB1 18 (SUTURE) ×2 IMPLANT
SYR 3ML LL SCALE MARK (SYRINGE) ×2 IMPLANT
SYSTEM CHEST DRAIN TLS 7FR (DRAIN) IMPLANT

## 2014-11-24 NOTE — Transfer of Care (Signed)
Immediate Anesthesia Transfer of Care Note  Patient: Kimberly HawthorneJessica B Santiago  Procedure(s) Performed: Procedure(s) with comments: CYST REMOVAL (N/A) - upper lip  Patient Location: PACU  Anesthesia Type:General  Level of Consciousness: Alert, Awake, Oriented  Airway & Oxygen Therapy: Patient Spontanous Breathing  Post-op Assessment: Report given to RN  Post vital signs: Reviewed and stable  Last Vitals:  Filed Vitals:   11/24/14 0958  BP:   Pulse: 99  Temp: 36.4 C  Resp: 14    Complications: No apparent anesthesia complications

## 2014-11-24 NOTE — Anesthesia Postprocedure Evaluation (Signed)
  Anesthesia Post-op Note  Patient: Mikeal HawthorneJessica B Murley  Procedure(s) Performed: Procedure(s) with comments: CYST REMOVAL (N/A) - upper lip  Anesthesia type:General  Patient location: PACU  Post pain: Pain level controlled  Post assessment: Post-op Vital signs reviewed, Patient's Cardiovascular Status Stable, Respiratory Function Stable, Patent Airway and No signs of Nausea or vomiting  Post vital signs: Reviewed and stable  Last Vitals:  Filed Vitals:   11/24/14 1045  BP: 140/87  Pulse: 76  Temp:   Resp: 15    Level of consciousness: awake, alert  and patient cooperative  Complications: Nausea. Decadron and zofran given.

## 2014-11-24 NOTE — Op Note (Signed)
.  11/24/2014  9:55 AM    Floydene FlockMoore, Rashay  [id number]   Pre-Op Dx: Charisse KlinefelterSEBACIOUS CYST  Post-op Dx: SAME  Proc: Excision of midline facial mass via intra-oral route  Surg: Tasheka Houseman  Anes: GOT  EBL: <5ccs  Comp: None  Indications: Enlarging subcutaneous facial mass just above vermillion border of upper lip  Findings: Firm cyst removed via intra-oral route consistent with sebaceous cyst.  Description of Procedure: After the patient was identified in hold and the history and physical and consent was reviewed and updated. The patient was marked in the normal fashion. The patient was next taken to the operating room and placed in a supine position. General endotracheal anesthesia was induced in the normal fashion. The patient's face and oral cavity was prepped and draped in a normal fashion with betadine.  3 ccs' of 1% Lidocaine with 1:100,000 Epinephrine was injected into the patient's cutaneous skin and intraoral mucosa on both sides of the facial mass.  At this time, a 15 blade scalpel was used to make an intra-oral mucosal incision just beneath and deep to the facial mass. Blunt dissection with hemostats was carefully performed through the submucosal tissues with combination of needle tip electrocautery blunt dissection.  The orbicularis oris muscle was bluntly dissected through encountering an encapsulated cyst just beneath the dermis.  Blunt circumferential dissection was performed.  No skin connection was encountered.  Careful dissection of the underlying dermis was peeled away from the cyst with care looking for dermal connection.  Once the cyst was completely dissected away from the dermis, the remaining attatchments were transected with needle tip bovie and the specimen was sent for permananent pathologcial evaluation.  The wound was copiously irrigated with sterile saline. Meticulous hemostasis with bovie was obtained.  The wound was then closed in a multilayered  fashion with vicryl for closure of the muscular tissues and chromic for the mucosal closure.  Due to no connection with the skin, no cutaneous incision was needed.  At this time the patient was extubated and taken to PACU in good condition.  Plan: Follow pathology. Follow up next week for post-operative evaluation.  Gearldene Fiorenza  11/24/2014 9:55 AM

## 2014-11-24 NOTE — Discharge Instructions (Signed)
AMBULATORY SURGERY  DISCHARGE INSTRUCTIONS   1) The drugs that you were given will stay in your system until tomorrow so for the next 24 hours you should not:  A) Drive an automobile B) Make any legal decisions C) Drink any alcoholic beverage   2) You may resume regular meals tomorrow.  Today it is better to start with liquids and gradually work up to solid foods.  You may eat anything you prefer, but it is better to start with liquids, then soup and crackers, and gradually work up to solid foods.   3) Please notify your doctor immediately if you have any unusual bleeding, trouble breathing, redness and pain at the surgery site, drainage, fever, or pain not relieved by medication. 4)   5) Your post-operative visit with Dr.    Druscilla BrowniePorfilio                              Please call to schedule your post-operative visit especially if you are having any issues.   6) Additional Instructions:

## 2014-11-24 NOTE — H&P (Signed)
..  History and Physical paper copy reviewed and updated date of procedure and will be scanned into system.  

## 2014-11-24 NOTE — Anesthesia Preprocedure Evaluation (Signed)
Anesthesia Evaluation  Patient identified by MRN, date of birth, ID band Patient awake    Reviewed: Allergy & Precautions, NPO status , Patient's Chart, lab work & pertinent test results  History of Anesthesia Complications (+) PONV and history of anesthetic complications  Airway Mallampati: III  TM Distance: >3 FB     Dental  (+) Chipped   Pulmonary asthma , sleep apnea ,          Cardiovascular     Neuro/Psych  Headaches, PSYCHIATRIC DISORDERS Anxiety Depression    GI/Hepatic hiatal hernia, GERD-  ,  Endo/Other  Morbid obesity  Renal/GU      Musculoskeletal   Abdominal   Peds  Hematology   Anesthesia Other Findings   Reproductive/Obstetrics                             Anesthesia Physical Anesthesia Plan  ASA: III  Anesthesia Plan: General   Post-op Pain Management:    Induction: Intravenous  Airway Management Planned: LMA  Additional Equipment:   Intra-op Plan:   Post-operative Plan:   Informed Consent:   Plan Discussed with: CRNA  Anesthesia Plan Comments:         Anesthesia Quick Evaluation

## 2014-11-24 NOTE — Anesthesia Procedure Notes (Signed)
Procedure Name: Intubation Date/Time: 11/24/2014 9:09 AM Performed by: Sherron FlemingsHARVEY, Caprice Mccaffrey Pre-anesthesia Checklist: Patient identified, Emergency Drugs available, Suction available, Patient being monitored and Timeout performed Patient Re-evaluated:Patient Re-evaluated prior to inductionOxygen Delivery Method: Circle system utilized Preoxygenation: Pre-oxygenation with 100% oxygen Intubation Type: IV induction Ventilation: Mask ventilation without difficulty Laryngoscope Size: Mac and 3 Grade View: Grade II Tube type: Oral Tube size: 7.5 mm Number of attempts: 1 Placement Confirmation: ETT inserted through vocal cords under direct vision,  positive ETCO2,  CO2 detector and breath sounds checked- equal and bilateral Secured at: 20 cm Tube secured with: Tape Dental Injury: Teeth and Oropharynx as per pre-operative assessment

## 2014-11-25 LAB — SURGICAL PATHOLOGY

## 2015-05-08 IMAGING — US US BREAST*L* LIMITED INC AXILLA
1 series · 10 of 10 positions shown · non-contrast
Comparison: Previous exam(s).

CLINICAL DATA: 39-year-old female with lump felt in the left breast
at 2 o'clock by the patient's referring clinician. The patient is
unsure of the exact location of the lump.

EXAM:
2D DIGITAL DIAGNOSTIC BILATERAL MAMMOGRAM WITH CAD AND ADJUNCT TOMO
ULTRASOUND LEFT BREAST

[Series 1: us breast*left* limited inc axilla · 0.06mm/px · 10 of 10 slices shown]
[im 1/10]
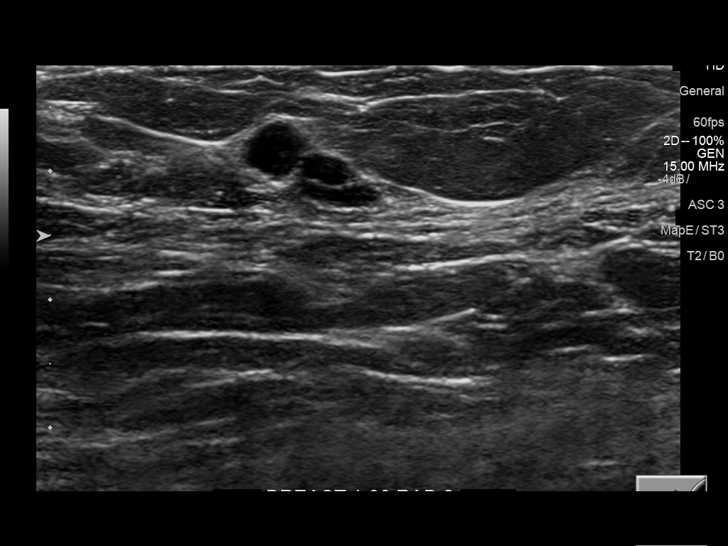
[im 2/10]
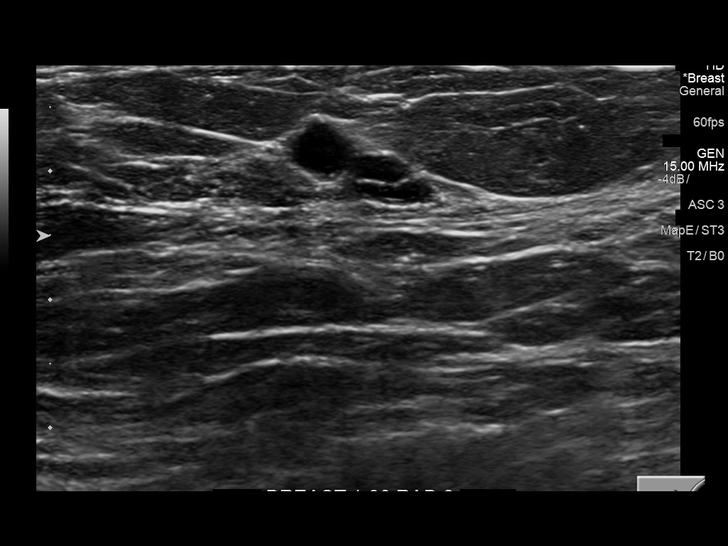
[im 3/10]
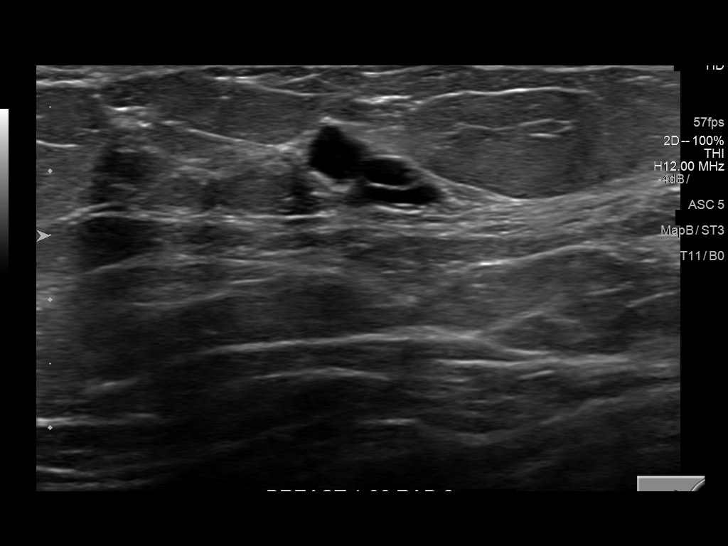
[im 4/10]
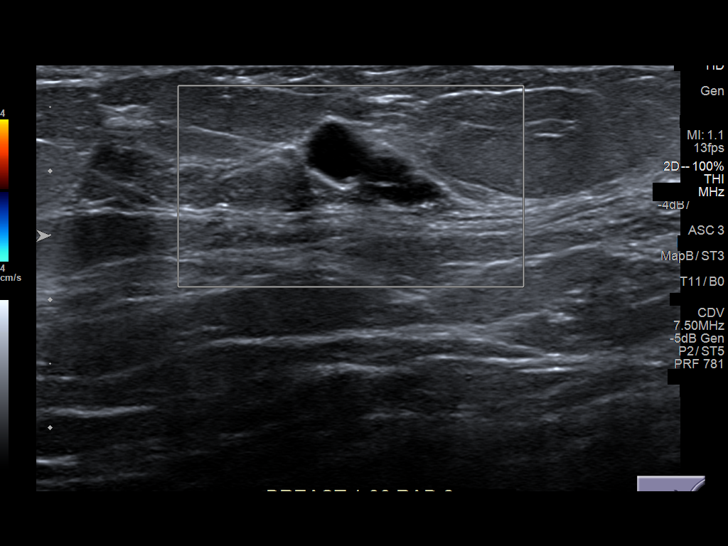
[im 5/10]
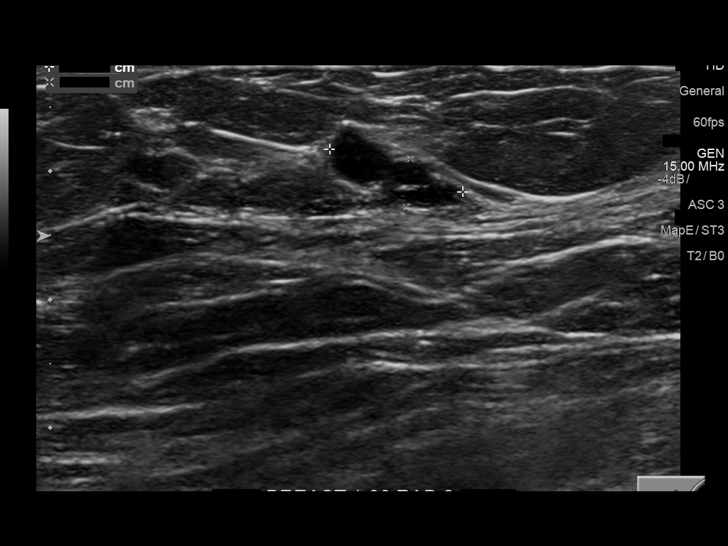
[im 6/10]
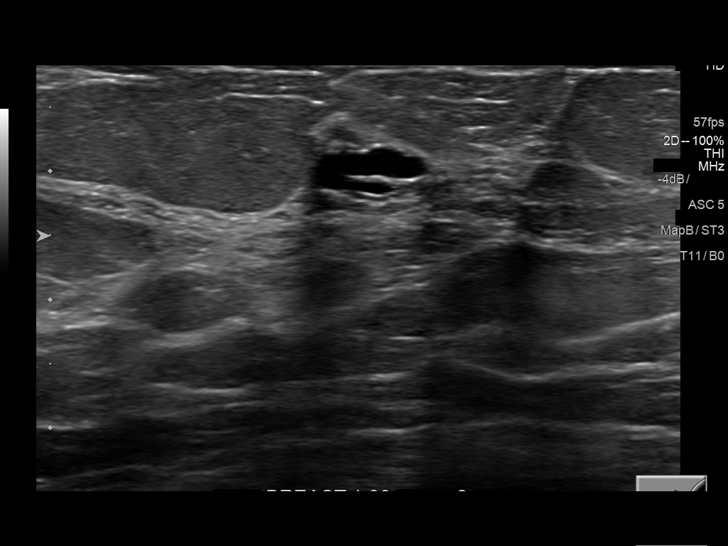
[im 7/10]
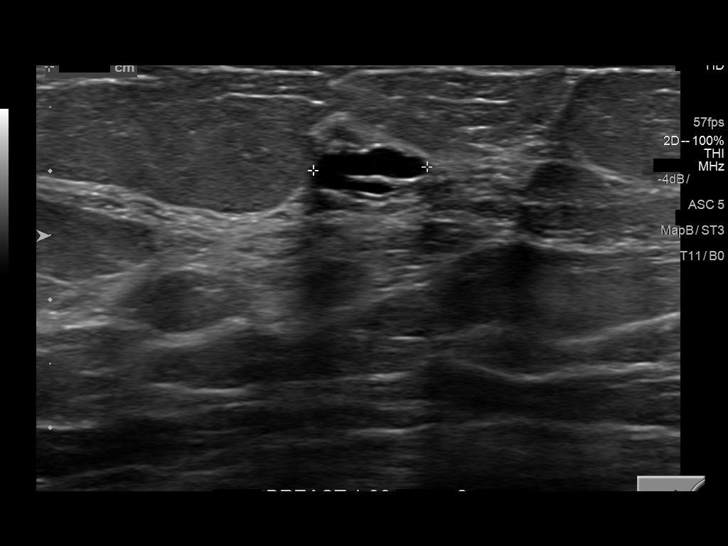
[im 8/10]
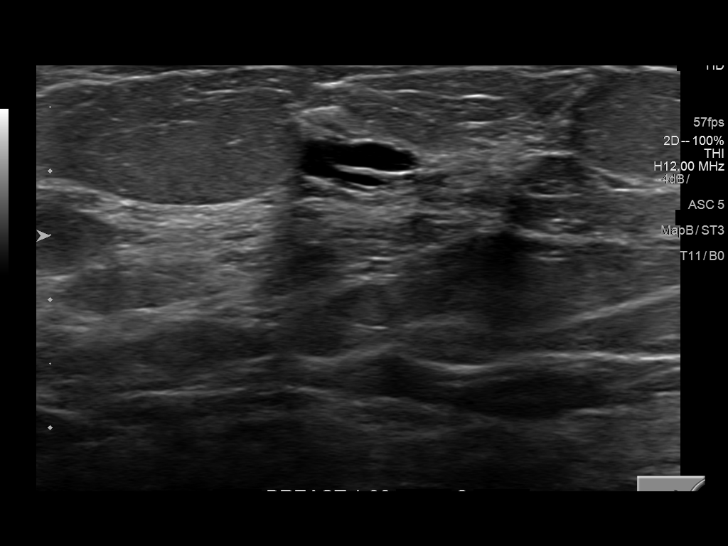
[im 9/10]
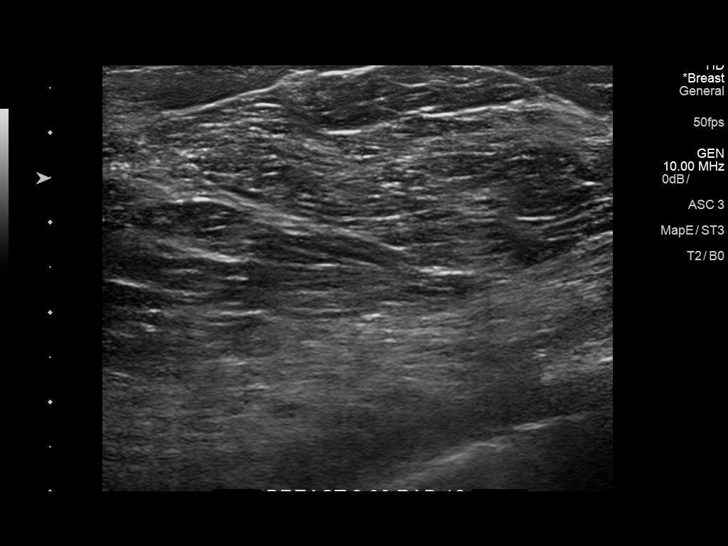
[im 10/10]
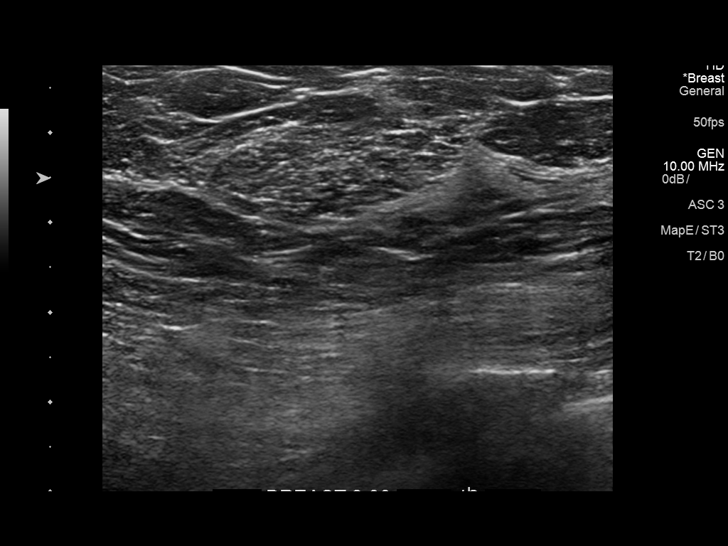

[10 of 10 positions shown; findings below may reference images not displayed]

ACR Breast Density Category b: There are scattered areas of
fibroglandular density.
FINDINGS: No suspicious mass, calcifications, or other abnormality is
identified within the right breast. An oval, circumscribed,
low-density mass is seen within the upper, outer left breast which
is unchanged from the [DATE] mammogram, consistent with a benign
etiology. No suspicious calcifications or distortion is identified
within the left breast.

Mammographic images were processed with CAD.

On physical exam, a mobile ridge of tissue is felt within the left
breast at 2 o'clock, 12 cm from the nipple without discrete mass.

Targeted ultrasound is performed, showing normal appearing dense
tissue corresponding to the ridge of tissue felt within the left
breast at 2 o'clock, 12 cm from the nipple. A cluster of cysts is
seen at 1 o'clock, 8 cm from the nipple measuring 11 x 4 x 9 mm,
corresponding to the oval low-density mass seen within the upper,
outer left breast. No suspicious cystic or solid sonographic finding
is identified.
IMPRESSION: No mammographic or sonographic evidence of malignancy.

RECOMMENDATION:
Screening mammogram in one year.(Code:[4C])

I have discussed the findings and recommendations with the patient.
Results were also provided in writing at the conclusion of the
visit. If applicable, a reminder letter will be sent to the patient
regarding the next appointment.

BI-RADS CATEGORY  2: Benign.

## 2015-09-19 DIAGNOSIS — K912 Postsurgical malabsorption, not elsewhere classified: Secondary | ICD-10-CM | POA: Insufficient documentation

## 2015-10-30 ENCOUNTER — Other Ambulatory Visit: Payer: Self-pay | Admitting: Obstetrics

## 2015-10-30 DIAGNOSIS — N63 Unspecified lump in unspecified breast: Secondary | ICD-10-CM

## 2015-11-09 ENCOUNTER — Ambulatory Visit
Admission: RE | Admit: 2015-11-09 | Discharge: 2015-11-09 | Disposition: A | Discharge status: Home / Self Care | Payer: BLUE CROSS/BLUE SHIELD | Source: Ambulatory Visit | Attending: Obstetrics | Admitting: Obstetrics

## 2015-11-09 DIAGNOSIS — N63 Unspecified lump in unspecified breast: Secondary | ICD-10-CM

## 2015-11-09 IMAGING — MG MM DIGITAL DIAGNOSTIC BILAT W/ TOMO W/ CAD
8 of 19 series · 8 of 40 positions shown · non-contrast
Comparison: Previous exam(s).

CLINICAL DATA: 39-year-old female with lump felt in the left breast
at 2 o'clock by the patient's referring clinician. The patient is
unsure of the exact location of the lump.

EXAM:
2D DIGITAL DIAGNOSTIC BILATERAL MAMMOGRAM WITH CAD AND ADJUNCT TOMO
ULTRASOUND LEFT BREAST

[R MLO synth-2D]
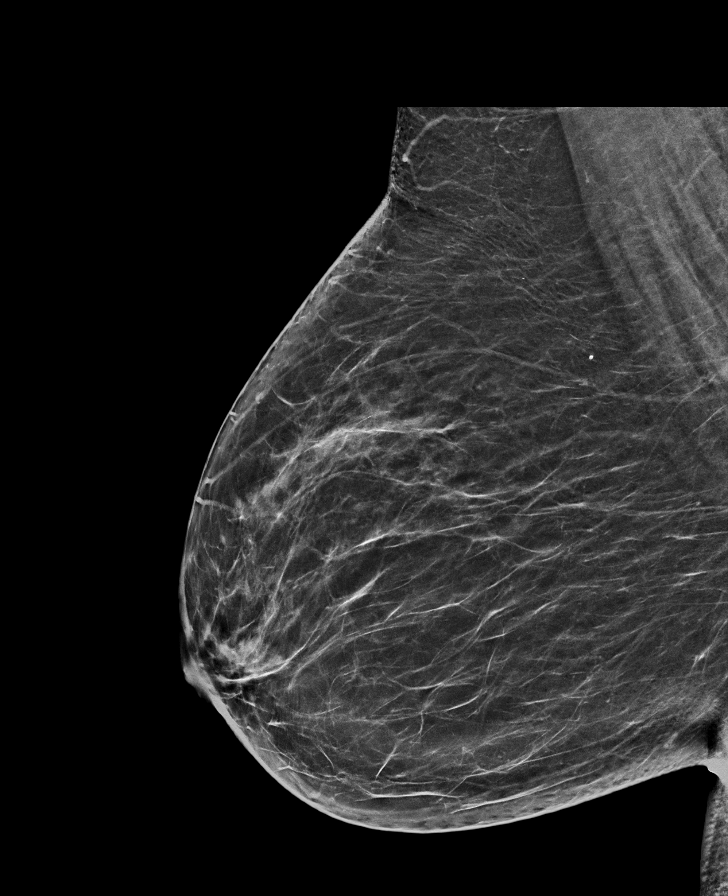

[L CC (1 of 2)]
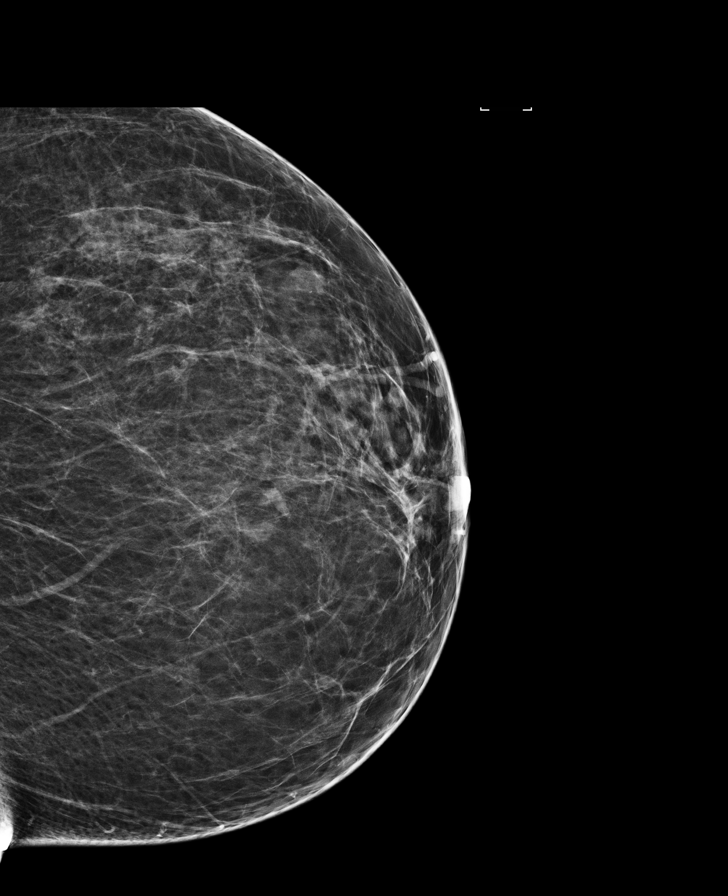

[R CC synth-2D (1 of 2)]
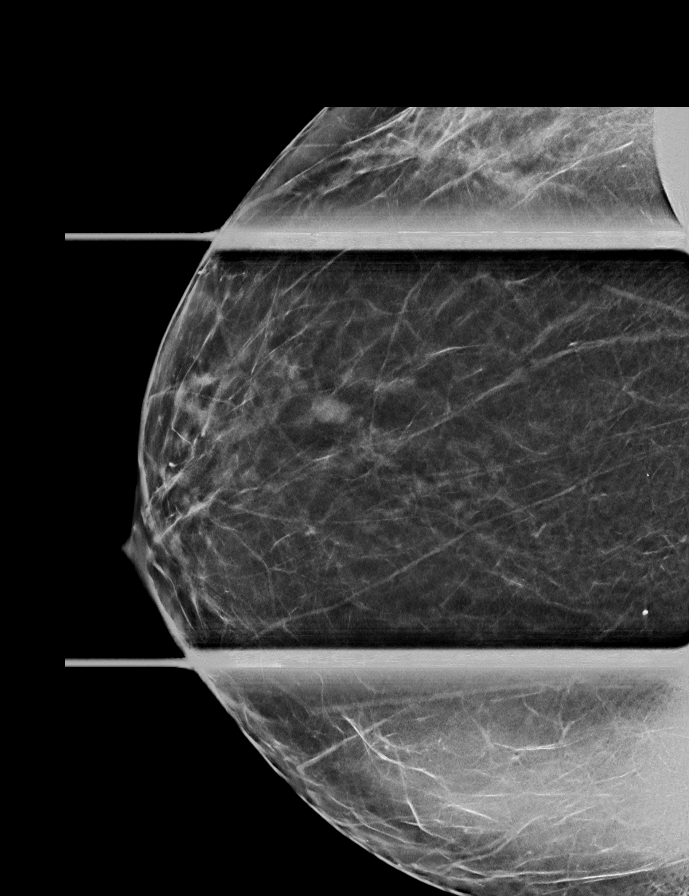

[R MLO]
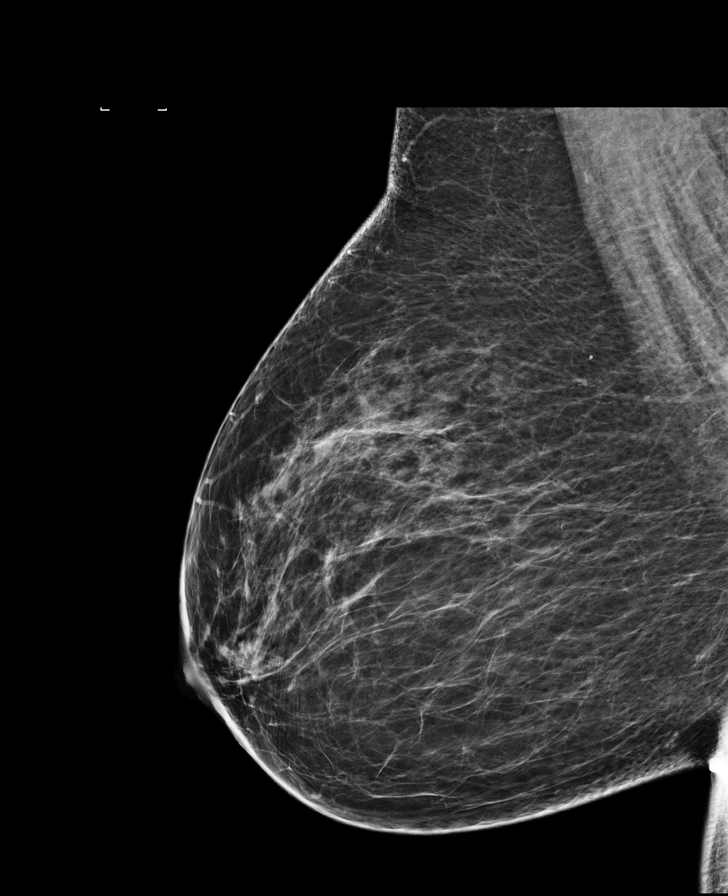

[L CC (2 of 2)]
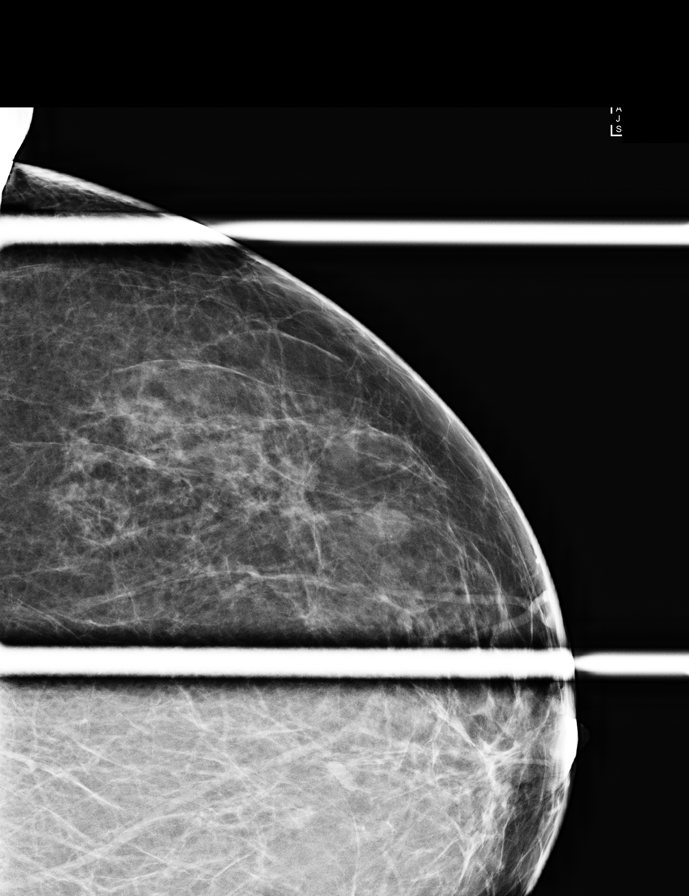

[R CC]
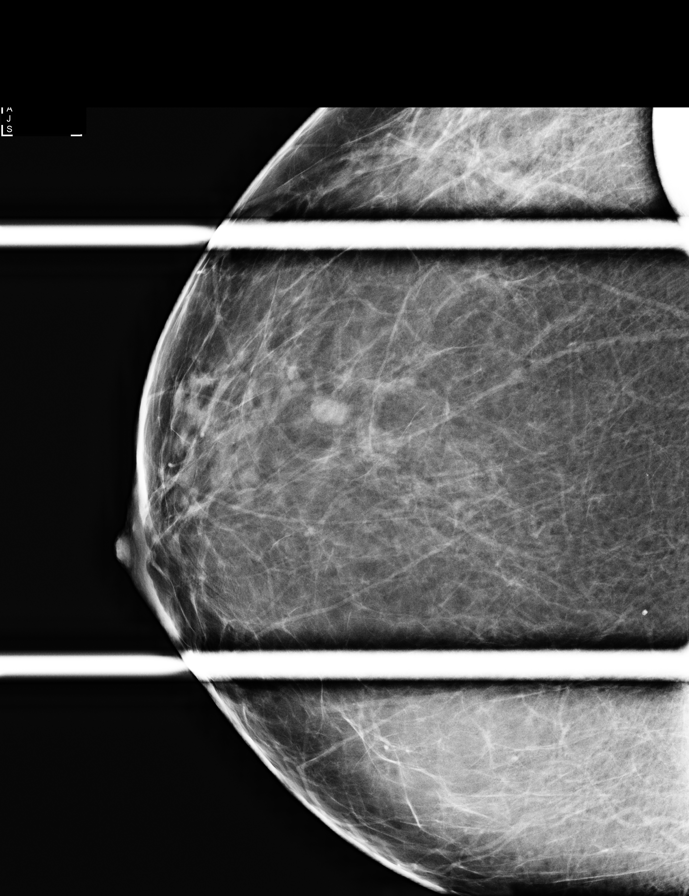

[R ML]
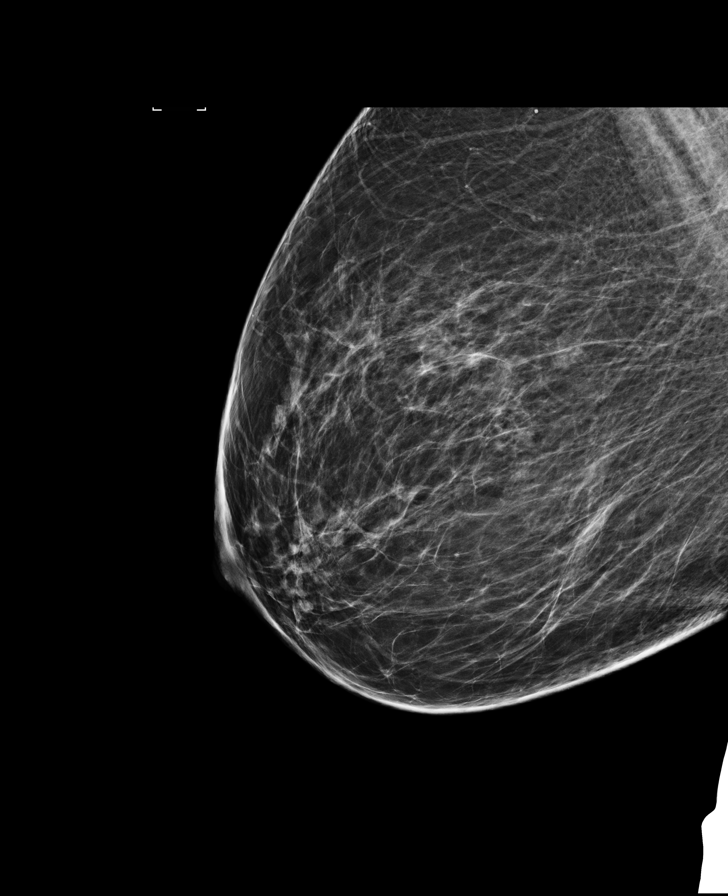

[R CC synth-2D (2 of 2)]
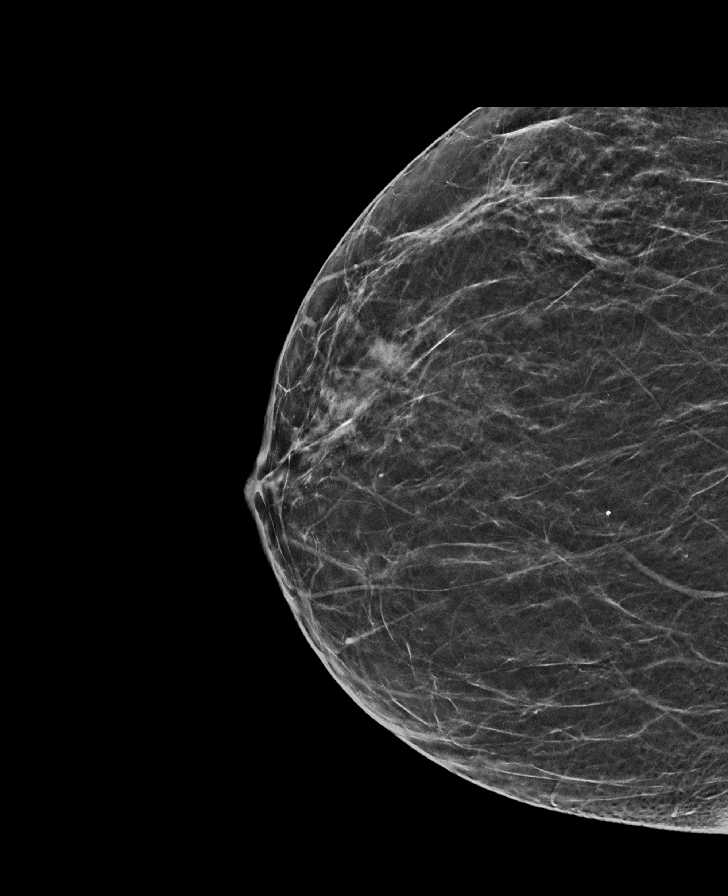

[8 of 40 positions shown; findings below may reference images not displayed]

ACR Breast Density Category b: There are scattered areas of
fibroglandular density.
FINDINGS: No suspicious mass, calcifications, or other abnormality is
identified within the right breast. An oval, circumscribed,
low-density mass is seen within the upper, outer left breast which
is unchanged from the [DATE] mammogram, consistent with a benign
etiology. No suspicious calcifications or distortion is identified
within the left breast.

Mammographic images were processed with CAD.

On physical exam, a mobile ridge of tissue is felt within the left
breast at 2 o'clock, 12 cm from the nipple without discrete mass.

Targeted ultrasound is performed, showing normal appearing dense
tissue corresponding to the ridge of tissue felt within the left
breast at 2 o'clock, 12 cm from the nipple. A cluster of cysts is
seen at 1 o'clock, 8 cm from the nipple measuring 11 x 4 x 9 mm,
corresponding to the oval low-density mass seen within the upper,
outer left breast. No suspicious cystic or solid sonographic finding
is identified.
IMPRESSION: No mammographic or sonographic evidence of malignancy.

RECOMMENDATION:
Screening mammogram in one year.(Code:[4C])

I have discussed the findings and recommendations with the patient.
Results were also provided in writing at the conclusion of the
visit. If applicable, a reminder letter will be sent to the patient
regarding the next appointment.

BI-RADS CATEGORY  2: Benign.

## 2016-03-29 ENCOUNTER — Encounter: Payer: Self-pay | Admitting: Emergency Medicine

## 2016-03-29 ENCOUNTER — Ambulatory Visit
Admission: EM | Admit: 2016-03-29 | Discharge: 2016-03-29 | Disposition: A | Discharge status: Home / Self Care | Payer: BLUE CROSS/BLUE SHIELD | Attending: Family Medicine | Admitting: Family Medicine

## 2016-03-29 ENCOUNTER — Ambulatory Visit (INDEPENDENT_AMBULATORY_CARE_PROVIDER_SITE_OTHER): Payer: BLUE CROSS/BLUE SHIELD

## 2016-03-29 DIAGNOSIS — M7741 Metatarsalgia, right foot: Secondary | ICD-10-CM

## 2016-03-29 IMAGING — CR DG FOOT COMPLETE 3+V*R*
3 series · 3 of 3 positions shown · non-contrast
Comparison: None.

CLINICAL DATA: Right distal anterior/dorsal foot pain several
weeks, no known trauma or injury.

EXAM:
RIGHT FOOT COMPLETE - 3+ VIEW

[foot ap]
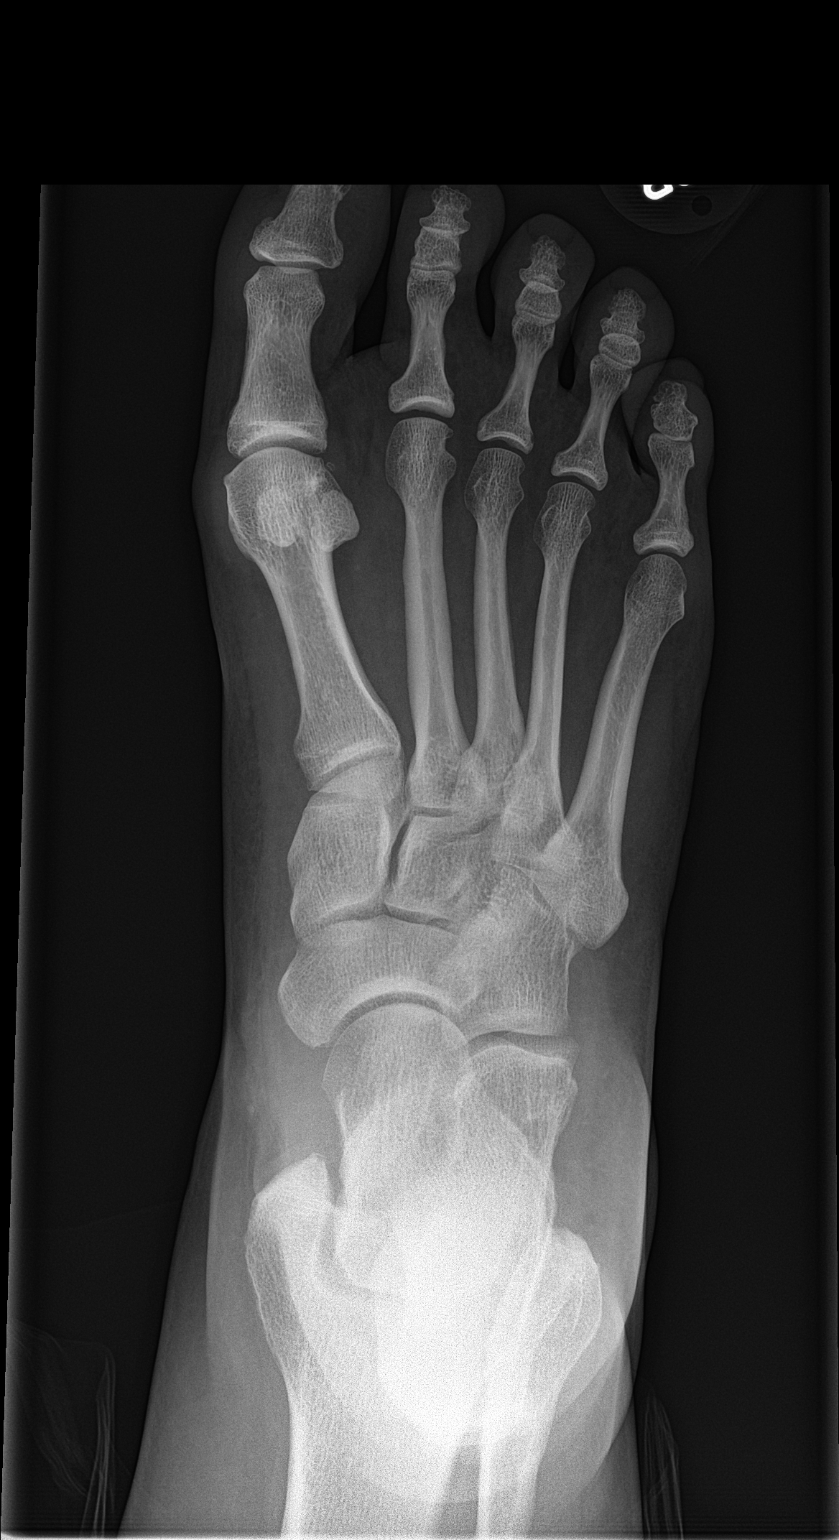

[foot obl]
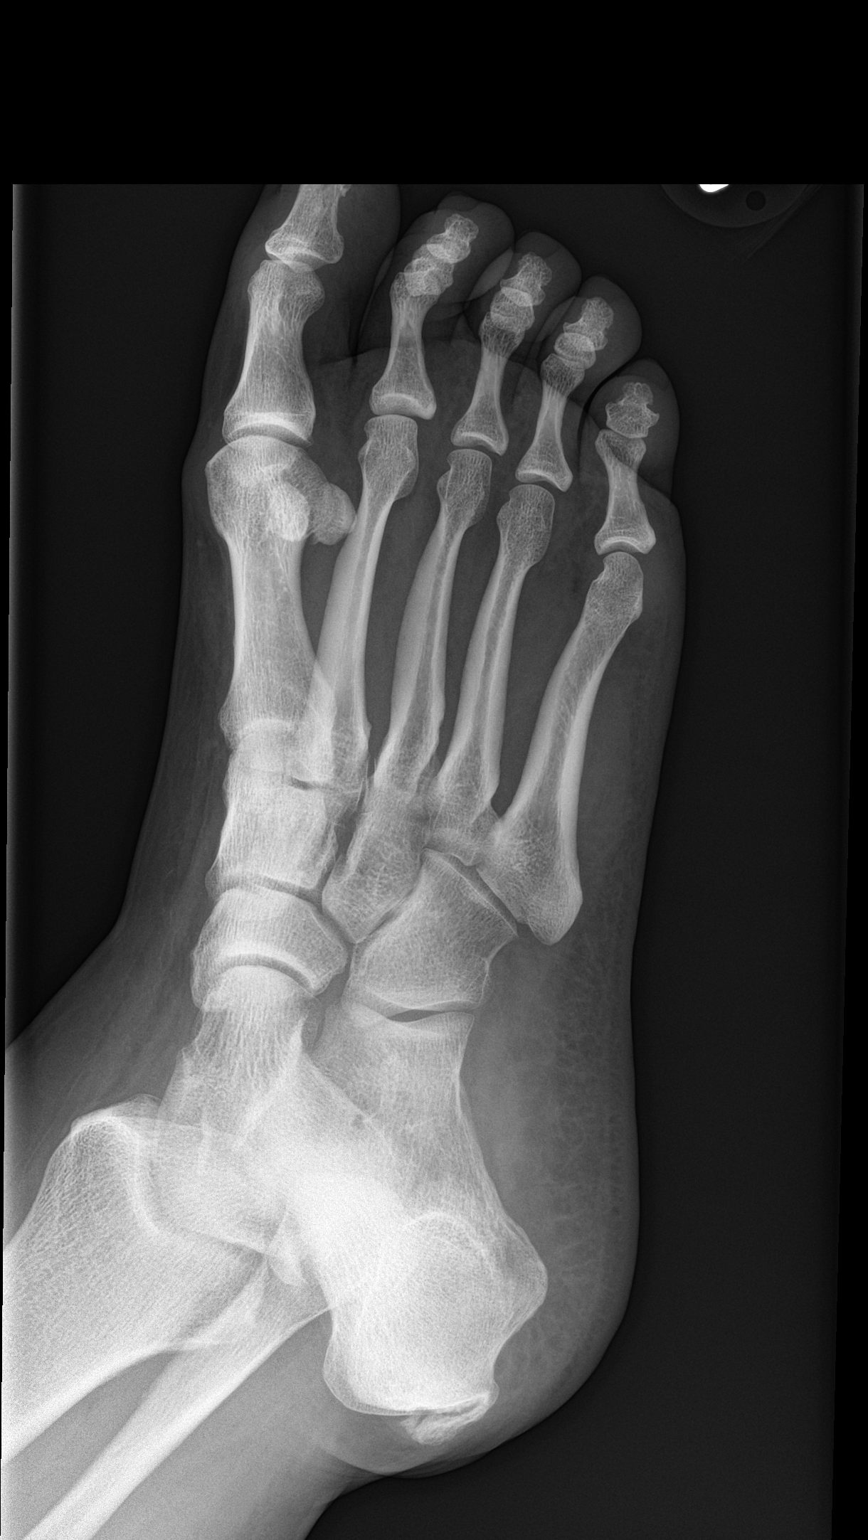

[foot lat]
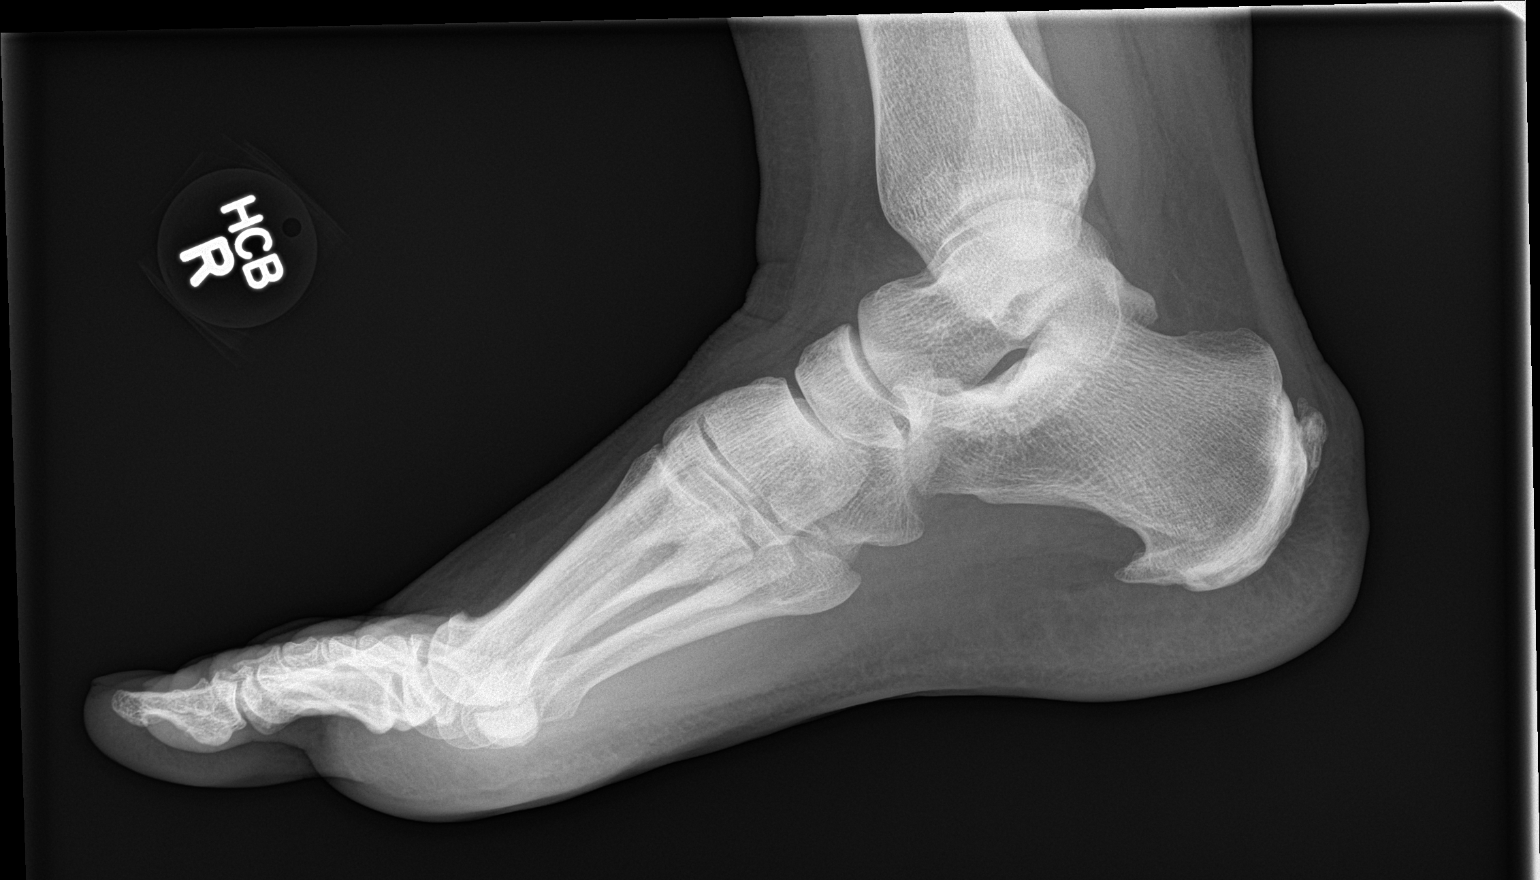

[3 of 3 positions shown; findings below may reference images not displayed]

FINDINGS: There is no evidence of fracture or dislocation. There is no
evidence of arthropathy or other focal bone abnormality. Soft
tissues are unremarkable. Incidental note made of chronic spurring
along the dorsal and plantar margins of the posterior calcaneus.
IMPRESSION: No acute findings.

## 2016-03-29 NOTE — ED Triage Notes (Signed)
Patient c/o pain in her right foot on top and in her right 1st toe for several weeks.  Patient denies injury.

## 2016-03-29 NOTE — ED Provider Notes (Signed)
CSN: [id number]     Arrival date & time 03/29/16  1603 History   First MD Initiated Contact with Patient 03/29/16 1726     Chief Complaint  Patient presents with  . Foot Pain   (Consider location/radiation/quality/duration/timing/severity/associated sxs/prior Treatment) HPI  Is a 40 year old female who presents with nontraumatic right foot pain. He states that she's had this for about 3 weeks. Most of the pain is over her forefoot for mostly from the second through fourth metatarsals at the neck/head.. Is no swelling or erythema. 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 Is a history of stress fractures in her left foot from previous CNA work. Her pain has been worse over the last few days.      Past Medical History:  Diagnosis Date  . Asthma   . Barrett esophagus   . Complication of anesthesia    Pt stopped breathing during EGD but did well with recent Gastric Bypass Surgery  . Depression   . GERD (gastroesophageal reflux disease)   . Headache   . History of hiatal hernia   . PONV (postoperative nausea and vomiting)   . Sleep apnea    Past Surgical History:  Procedure Laterality Date  . CHOLECYSTECTOMY    . DIAGNOSTIC LAPAROSCOPY    . EAR CYST EXCISION N/A 11/24/2014   Procedure: CYST REMOVAL;  Surgeon: Bud Facereighton Vaught, MD;  Location: [facility] ORS;  Service: ENT;  Laterality: N/A;  upper lip  . ESOPHAGOGASTRODUODENOSCOPY    . GASTRIC BYPASS  March 2016  . KNEE ARTHROSCOPY Right   . TEMPOROMANDIBULAR JOINT SURGERY    . WISDOM TOOTH EXTRACTION     History reviewed. No pertinent family history. Social History  Substance Use Topics  . Smoking status: Never Smoker  . Smokeless tobacco: Never Used  . Alcohol use No   OB History    No data available     Review of Systems  Constitutional:  Positive for activity change. Negative for chills, fatigue and fever.  Musculoskeletal: Positive for gait problem and myalgias.  All other systems reviewed and are negative.   Allergies  Amoxicillin; Codeine; Hydrocodone; and Nsaids  Home Medications   Prior to Admission medications   Medication Sig Start Date End Date Taking? Authorizing Provider  Calcium Carbonate-Vitamin D (CALCIUM + D PO) Take 1 tablet by mouth at bedtime.    Historical Provider, MD  citalopram (CELEXA) 40 MG tablet Take 40 mg by mouth every morning.    Historical Provider, MD  loratadine (CLARITIN) 10 MG tablet Take 10 mg by mouth at bedtime.    Historical Provider, MD  pantoprazole (PROTONIX) 40 MG tablet Take 40 mg by mouth at bedtime.    Historical Provider, MD  promethazine (PHENERGAN) 12.5 MG tablet Take 1 tablet (12.5 mg total) by mouth every 6 (six) hours as needed for nausea or vomiting. 11/24/14   Bud Facereighton Vaught, MD  UNABLE TO FIND Med Name: Vitamin B 12 Nasal Spray once/week    Historical Provider, MD   Meds Ordered and Administered this Visit  Medications - No data to display  BP 108/67 (BP Location: Right Arm)   Pulse 64   Temp 97.5 F (36.4 C) (Tympanic)   Resp 16   Ht 5\' 8"  (1.727 m)   Wt 250 lb (113.4  kg)   LMP 03/08/2016 (Approximate)   SpO2 100%   BMI 38.01 kg/m  No data found.   Physical Exam  Constitutional: She appears well-developed and well-nourished. No distress.  HENT:  Head: Normocephalic and atraumatic.  Eyes: EOM are normal. Pupils are equal, round, and reactive to light.  Neck: Normal range of motion. Neck supple.  Musculoskeletal:  Examination of her right foot shows no erythema or ecchymosis or swelling present. The patient has good range of motion of the ankle and subtalar joint. He has very significant pain over the second third and fourth metatarsal necks. MTP joints are comfortable and show good range of motion with the complaints of pain at the extremes of  dorsiflexion and plantar flexion.  Skin: She is not diaphoretic.  Nursing note and vitals reviewed.   Urgent Care Course   Clinical Course    Procedures (including critical care time)  Labs Review Labs Reviewed - No data to display  Imaging Review Dg Foot Complete Right  Result Date: 03/29/2016 CLINICAL DATA:  Right distal anterior/dorsal foot pain several weeks, no known trauma or injury. EXAM: RIGHT FOOT COMPLETE - 3+ VIEW COMPARISON:  None. FINDINGS: There is no evidence of fracture or dislocation. There is no evidence of arthropathy or other focal bone abnormality. Soft tissues are unremarkable. Incidental note made of chronic spurring along the dorsal and plantar margins of the posterior calcaneus. IMPRESSION: No acute findings. Electronically Signed   By: Bary Richard M.D.   On: 03/29/2016 18:19     Visual Acuity Review  Right Eye Distance:   Left Eye Distance:   Bilateral Distance:    Right Eye Near:   Left Eye Near:    Bilateral Near:     Patient was fitted with a boot orthosis    MDM   1. Metatarsalgia of right foot    New Prescriptions   No medications on file  Plan: 1. Test/x-ray results and diagnosis reviewed with patient 2. rx as per orders; risks, benefits, potential side effects reviewed with patient 3. Recommend supportive treatment with Orthosis for activities. Also benefit from metatarsal arch pads but I have asked her to follow-up with her podiatrist for his evaluation and recommendations. My opinion that the patient can continue to work if she uses the boot orthosis to control her discomfort. Says she is having a gastric bypass is not able to utilize anti-inflammatory medications rely on a local measures including massage heat or ice and elevation as necessary. She'll arrange an appointment with her podiatrist in the very near future. 4. F/u prn if symptoms worsen or don't improve     Lutricia Feil, PA-C 03/29/16 1858

## 2016-06-12 ENCOUNTER — Other Ambulatory Visit: Payer: Self-pay | Admitting: Podiatry

## 2016-06-12 DIAGNOSIS — M79671 Pain in right foot: Secondary | ICD-10-CM

## 2016-06-26 ENCOUNTER — Ambulatory Visit: Payer: BLUE CROSS/BLUE SHIELD

## 2016-09-23 ENCOUNTER — Ambulatory Visit (INDEPENDENT_AMBULATORY_CARE_PROVIDER_SITE_OTHER): Payer: BLUE CROSS/BLUE SHIELD

## 2016-09-23 ENCOUNTER — Encounter: Payer: Self-pay | Admitting: Emergency Medicine

## 2016-09-23 ENCOUNTER — Ambulatory Visit
Admission: EM | Admit: 2016-09-23 | Discharge: 2016-09-23 | Disposition: A | Discharge status: Home / Self Care | Payer: BLUE CROSS/BLUE SHIELD | Attending: Emergency Medicine | Admitting: Emergency Medicine

## 2016-09-23 DIAGNOSIS — M545 Low back pain, unspecified: Secondary | ICD-10-CM

## 2016-09-23 DIAGNOSIS — B9689 Other specified bacterial agents as the cause of diseases classified elsewhere: Secondary | ICD-10-CM | POA: Diagnosis not present

## 2016-09-23 DIAGNOSIS — N760 Acute vaginitis: Secondary | ICD-10-CM | POA: Diagnosis not present

## 2016-09-23 LAB — CBC WITH DIFFERENTIAL/PLATELET
Basophils Absolute: 0.1 10*3/uL (ref 0–0.1)
Basophils Relative: 1 %
Eosinophils Absolute: 0.3 10*3/uL (ref 0–0.7)
Eosinophils Relative: 3 %
HCT: 44.9 % (ref 35.0–47.0)
Hemoglobin: 15 g/dL (ref 12.0–16.0)
Lymphocytes Relative: 28 %
Lymphs Abs: 2.6 10*3/uL (ref 1.0–3.6)
MCH: 29 pg (ref 26.0–34.0)
MCHC: 33.4 g/dL (ref 32.0–36.0)
MCV: 86.7 fL (ref 80.0–100.0)
Monocytes Absolute: 0.7 10*3/uL (ref 0.2–0.9)
Monocytes Relative: 8 %
Neutro Abs: 5.5 10*3/uL (ref 1.4–6.5)
Neutrophils Relative %: 60 %
Platelets: 319 10*3/uL (ref 150–440)
RBC: 5.17 MIL/uL (ref 3.80–5.20)
RDW: 12.9 % (ref 11.5–14.5)
WBC: 9.1 10*3/uL (ref 3.6–11.0)

## 2016-09-23 LAB — COMPREHENSIVE METABOLIC PANEL
ALT: 29 U/L (ref 14–54)
AST: 23 U/L (ref 15–41)
Albumin: 3.9 g/dL (ref 3.5–5.0)
Alkaline Phosphatase: 54 U/L (ref 38–126)
Anion gap: 7 (ref 5–15)
BUN: 15 mg/dL (ref 6–20)
CO2: 26 mmol/L (ref 22–32)
Calcium: 8.9 mg/dL (ref 8.9–10.3)
Chloride: 105 mmol/L (ref 101–111)
Creatinine, Ser: 0.76 mg/dL (ref 0.44–1.00)
GFR calc Af Amer: >60 mL/min (ref >60)
GFR calc non Af Amer: >60 mL/min (ref >60)
Glucose, Bld: 89 mg/dL (ref 65–99)
Potassium: 4.2 mmol/L (ref 3.5–5.1)
Sodium: 138 mmol/L (ref 135–145)
Total Bilirubin: 0.6 mg/dL (ref 0.3–1.2)
Total Protein: 7.4 g/dL (ref 6.5–8.1)

## 2016-09-23 LAB — URINALYSIS, COMPLETE (UACMP) WITH MICROSCOPIC
Bilirubin Urine: NEGATIVE
Glucose, UA: NEGATIVE mg/dL
Ketones, ur: NEGATIVE mg/dL
Leukocytes, UA: NEGATIVE
Nitrite: NEGATIVE
Protein, ur: NEGATIVE mg/dL
Specific Gravity, Urine: 1.01 (ref 1.005–1.030)
pH: 5.5 (ref 5.0–8.0)

## 2016-09-23 LAB — WET PREP, GENITAL
Sperm: NONE SEEN
Trich, Wet Prep: NONE SEEN
Yeast Wet Prep HPF POC: NONE SEEN

## 2016-09-23 LAB — PREGNANCY, URINE: Preg Test, Ur: NEGATIVE

## 2016-09-23 LAB — LIPASE, BLOOD: Lipase: 40 U/L (ref 11–51)

## 2016-09-23 IMAGING — CR DG LUMBAR SPINE COMPLETE 4+V
5 series · 5 of 5 positions shown · non-contrast
Comparison: Abdominal CT [DATE]

CLINICAL DATA: L5-S1 bony tenderness. Evaluate for fracture or
other acute finding. Severe back pain started this morning.

EXAM:
LUMBAR SPINE - COMPLETE 4+ VIEW

[l-spine ap]
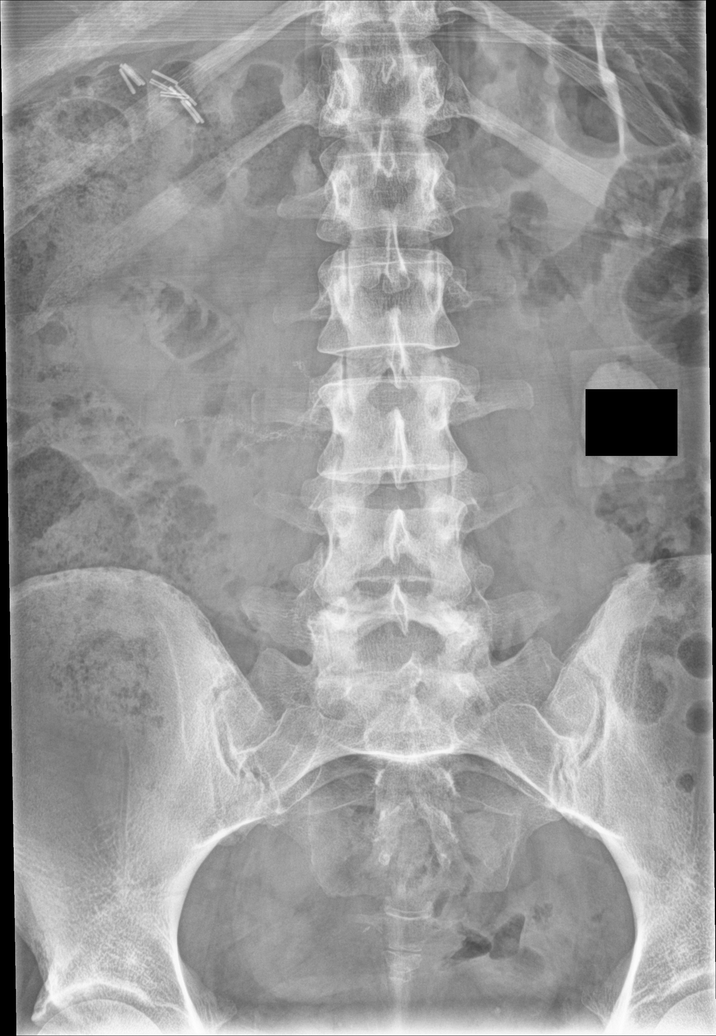

[l-spine obl (1 of 2)]
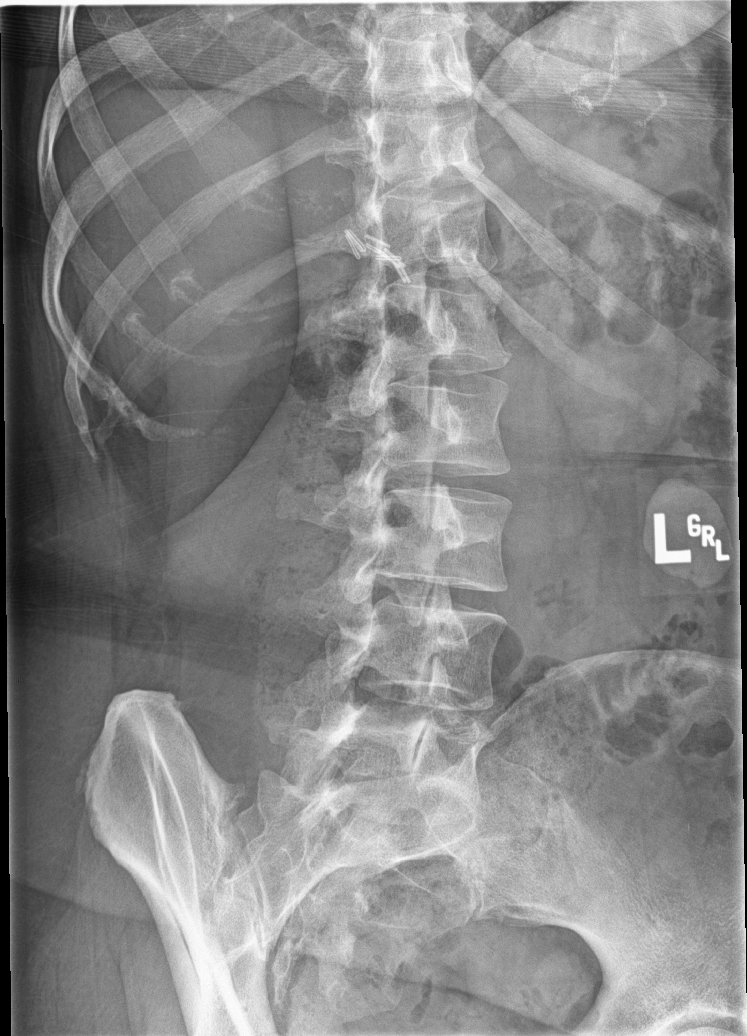

[l-spine lat]
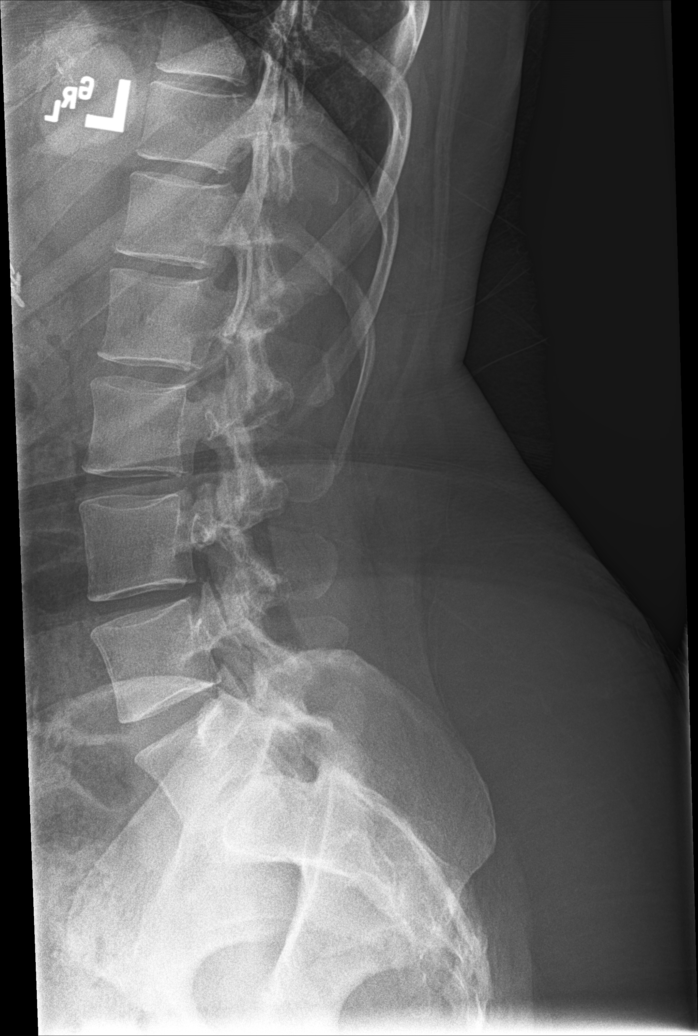

[l-spine spot]
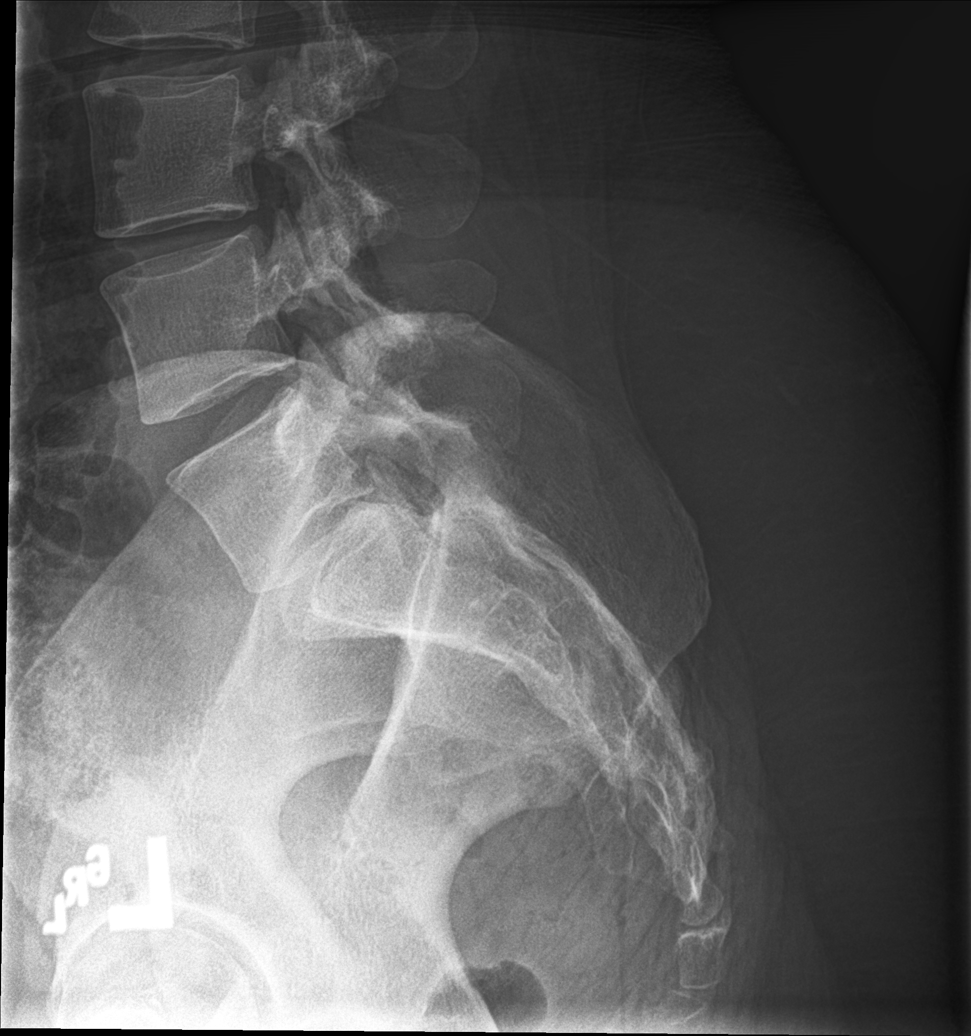

[l-spine obl (2 of 2)]
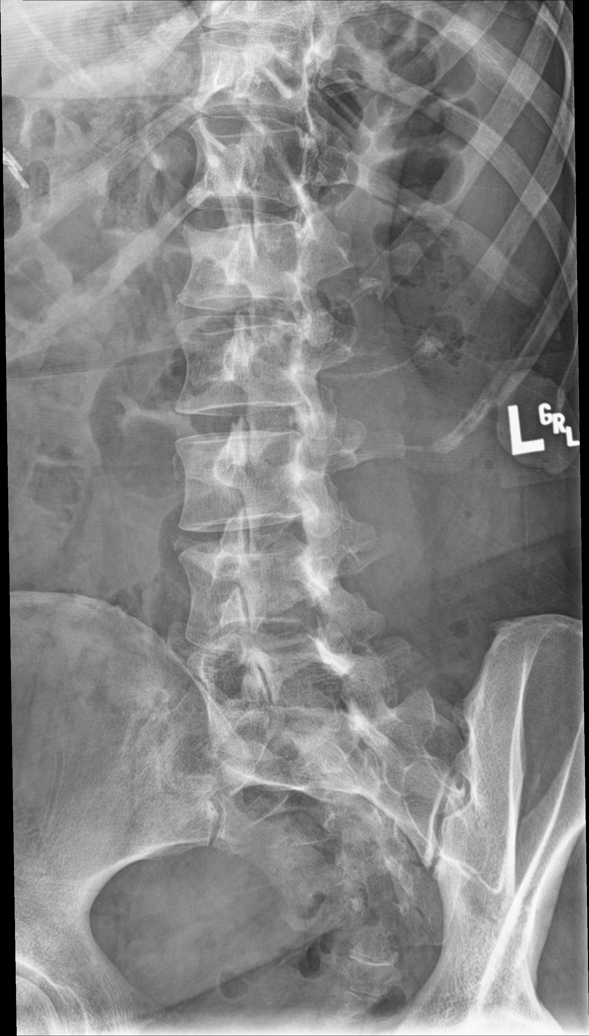

[5 of 5 positions shown; findings below may reference images not displayed]

FINDINGS: There is no evidence of lumbar spine fracture. Alignment is normal.
Intervertebral disc spaces are maintained. Unremarkable sacroiliac
joints.

Changes of cholecystectomy and bowel surgery.
IMPRESSION: Negative lumbar spine.

## 2016-09-23 MED ORDER — METAXALONE 800 MG PO TABS: 800.0000 mg | ORAL_TABLET | Freq: Three times a day (TID) | ORAL | 0 refills | Status: DC

## 2016-09-23 MED ORDER — METRONIDAZOLE 500 MG PO TABS: 500.0000 mg | ORAL_TABLET | Freq: Two times a day (BID) | ORAL | 0 refills | Status: AC

## 2016-09-23 MED ORDER — KETOROLAC TROMETHAMINE 60 MG/2ML IM SOLN: 30.0000 mg | Freq: Once | INTRAMUSCULAR | Status: DC

## 2016-09-23 MED ORDER — TRAMADOL HCL 50 MG PO TABS: 50.0000 mg | ORAL_TABLET | Freq: Four times a day (QID) | ORAL | 0 refills | Status: DC | PRN

## 2016-09-23 NOTE — ED Triage Notes (Signed)
Patient c/o severe back pain that started this morning.  Patient reports vomiting once this morning.  Patient also reports suprapubic pain and increase in urinary frequency.

## 2016-09-23 NOTE — Discharge Instructions (Signed)
Take Tylenol 1 g up to 4 times a day on a regular basis for the next several days. tramadol for severe pain only. many people find gentle stretching and deep tissue massage helpful. Try [facility] on [address] in [place].. They have very reasonable prices and take walk ins. Follow-up with your primary care physician in several days, go to the ER for the signs and symptoms we discussed.

## 2016-09-23 NOTE — ED Provider Notes (Signed)
HPI  SUBJECTIVE:  Kimberly Santiago is a 41 y.o. female who presents with  acute onset of sharp, midline low back pain that woke her from sleep this morning. She states that this has improved and now she reports bilateral dull, achy mild pain radiating across both of her back. She reports an episode of nausea and vomiting secondary to the pain. She reports dysuria, urgency, frequency but states that she feels that she can empty her bladder fully. She reports bilateral pelvic pain which is same as her low back pain and vaginal pain. Symptoms are worse with urinating , with having a bowel movement this morning and with torso rotation, no alleviating factors. She has not tried anything for this. She denies fevers, abdominal pain, vaginal discharge, bleeding, genital order, genital rash, new or different dyspareunia. No syncope, no change in physical activity or lifting. No vaginal itching. She has never had symptoms like this before. No antipyretic in the past 6 or 8 hours. No syncope. No saddle anesthesia, distal weakness/numbness, bilateral radicular leg pain/weakness, fevers, recent h/o trauma, neurological deficits,  bladder/ bowel incontinence, urinary retention, h/o CA / multiple myleoma, pain worse at night,  h/o prolonged steroid use, h/o osteopenia, h/o IVDU, h/o HIV, known AAA.   no h/o pyelonephritis, nephrolithiasis.  Family history negative for nephrolithiasis, aortic abdominal aneurysm. She has a past medical history of endometriosis/PCOS, she has a 6 cm endometriomal cyst on her right ovary and states that she is on 2 hormonal medicines for this. She also has a history of urinary incontinence, cholecystectomy, says discussed her bypass. No history of gonorrhea, chlamydia, HIV, HSV, Trichomonas, BV, yeast, PID, diabetes, hypertension, ectopic pregnancy. She is in a long-term monogamous relationship with her husband, who is asymptomatic LMP: 3/18. Denies possibility of being pregnant. PMD: [facility] Greggory [facility]all[facility] at  [facility]. OB/GYN: Dr. Viviann Spare at [facility].    Past Medical History:  Diagnosis Date  . Asthma   . Barrett esophagus   . Complication of anesthesia    Pt stopped breathing during EGD but did well with recent Gastric Bypass Surgery  . Depression   . GERD (gastroesophageal reflux disease)   . Headache   . History of hiatal hernia   . PONV (postoperative nausea and vomiting)   . Sleep apnea     Past Surgical History:  Procedure Laterality Date  . CHOLECYSTECTOMY    . DIAGNOSTIC LAPAROSCOPY    . EAR CYST EXCISION N/A 11/24/2014   Procedure: CYST REMOVAL;  Surgeon: Bud Face, MD;  Location: [facility] ORS;  Service: ENT;  Laterality: N/A;  upper lip  . ESOPHAGOGASTRODUODENOSCOPY    . GASTRIC BYPASS  March 2016  . KNEE ARTHROSCOPY Right   . TEMPOROMANDIBULAR JOINT SURGERY    . WISDOM TOOTH EXTRACTION      History reviewed. No pertinent family history.  Social History  Substance Use Topics  . Smoking status: Never Smoker  . Smokeless tobacco: Never Used  . Alcohol use No    No current facility-administered medications for this encounter.   Current Outpatient Prescriptions:  .  letrozole (FEMARA) 2.5 MG tablet, Take 2.5 mg by mouth daily., Disp: , Rfl:  .  norethindrone (AYGESTIN) 5 MG tablet, Take 5 mg by mouth daily., Disp: , Rfl:  .  Prenatal Multivit-Min-Fe-FA (PRENATAL VITAMINS PO), Take 1 tablet by mouth daily., Disp: , Rfl:  .  Calcium Carbonate-Vitamin D (CALCIUM + D PO), Take 1 tablet by mouth at bedtime., Disp: , Rfl:  .  citalopram (CELEXA) 40 MG tablet, Take 40 mg by mouth every morning., Disp: , Rfl:  .  loratadine (CLARITIN) 10 MG tablet, Take 10 mg by mouth at bedtime., Disp: , Rfl:  .  metaxalone (SKELAXIN) 800 MG tablet, Take 1 tablet (800 mg total) by mouth 3 (three) times daily., Disp: 21 tablet, Rfl: 0 .  metroNIDAZOLE (FLAGYL) 500 MG tablet, Take 1 tablet (500 mg total) by mouth 2 (two) times daily., Disp: 14 tablet, Rfl:  0 .  pantoprazole (PROTONIX) 40 MG tablet, Take 40 mg by mouth at bedtime., Disp: , Rfl:  .  promethazine (PHENERGAN) 12.5 MG tablet, Take 1 tablet (12.5 mg total) by mouth every 6 (six) hours as needed for nausea or vomiting., Disp: 30 tablet, Rfl: 0 .  traMADol (ULTRAM) 50 MG tablet, Take 1 tablet (50 mg total) by mouth every 6 (six) hours as needed., Disp: 20 tablet, Rfl: 0 .  UNABLE TO FIND, Med Name: Vitamin B 12 Nasal Spray once/week, Disp: , Rfl:   Allergies  Allergen Reactions  . Amoxicillin Other (See Comments)  . Codeine Other (See Comments)  . Hydrocodone Other (See Comments)  . Nsaids Other (See Comments)    Pt cannot take due to Gastric Bypass Surgery     ROS  As noted in HPI.   Physical Exam  BP 112/69 (BP Location: Left Arm)   Pulse 76   Temp 97.8 F (36.6 C) (Oral)   Resp 16   Ht 5\' 8"  (1.727 m)   Wt 243 lb (110.2 kg)   LMP 09/15/2016 (Exact Date)   SpO2 100%   BMI 36.95 kg/m   Constitutional: Well developed, well nourished, no acute distress Eyes:  EOMI, conjunctiva normal bilaterally HENT: Normocephalic, atraumatic,mucus membranes moist Respiratory: Normal inspiratory effort Cardiovascular: Normal rate GI: nondistended. No suprapubic, flank tenderness. Normal bowel sounds skin: No rash, skin intact GU: Normal external genitalia. Scant white nonodorous vaginal discharge. Normal loss. No CMT. Uterus smooth, nontender. Positive right sided adnexal fullness, tenderness, patient states is not new for her and consistent with an endometrial cyst Musculoskeletal: no CVAT. + Left paralumbar tenderness, - muscle spasm. Positive tenderness at L5, S1 no SI joint tenderness. No other bony tenderness. Bilateral lower extremities nontender, baseline ROM with intact PT pulses,  No pain with int/ext rotation flex/extension hips. but  pain aggravated with active hip flexion and extension against resistance bilaterally. SLR neg bilaterally. Sensation baseline light touch  bilaterally for Pt, DTR's symmetric and intact bilaterally KJ, Motor symmetric bilateral 5/5 hip flexion, quadriceps, hamstrings, EHL, foot dorsiflexion, foot plantarflexion,gait normal.  Neurologic: Alert & oriented x 3, no focal neuro deficits Psychiatric: Speech and behavior appropriate   ED Course   Medications - No data to display  Orders Placed This Encounter  Procedures  . Pelvic exam    Standing Status:   Standing    Number of Occurrences:   1  . Wet prep, genital    Standing Status:   Standing    Number of Occurrences:   1  . DG Lumbar Spine Complete    Standing Status:   Standing    Number of Occurrences:   1    Order Specific Question:   Reason for Exam (SYMPTOM  OR DIAGNOSIS REQUIRED)    Answer:   L5 s1 bony tenderness r/o fx, acute changes  . Urinalysis, Complete w Microscopic    Standing Status:   Standing    Number of Occurrences:   1  . Pregnancy, urine  Standing Status:   Standing    Number of Occurrences:   1  . Comprehensive metabolic panel    Standing Status:   Standing    Number of Occurrences:   1  . Lipase, blood    Standing Status:   Standing    Number of Occurrences:   1  . CBC with Differential    Standing Status:   Standing    Number of Occurrences:   1    Results for orders placed or performed during the hospital encounter of 09/23/16 (from the past 24 hour(s))  Urinalysis, Complete w Microscopic     Status: Abnormal   Collection Time: 09/23/16  1:56 PM  Result Value Ref Range   Color, Urine YELLOW YELLOW   APPearance CLEAR CLEAR   Specific Gravity, Urine 1.010 1.005 - 1.030   pH 5.5 5.0 - 8.0   Glucose, UA NEGATIVE NEGATIVE mg/dL   Hgb urine dipstick TRACE (A) NEGATIVE   Bilirubin Urine NEGATIVE NEGATIVE   Ketones, ur NEGATIVE NEGATIVE mg/dL   Protein, ur NEGATIVE NEGATIVE mg/dL   Nitrite NEGATIVE NEGATIVE   Leukocytes, UA NEGATIVE NEGATIVE   Squamous Epithelial / LPF 0-5 (A) NONE SEEN   WBC, UA 0-5 0 - 5 WBC/hpf   RBC / HPF 0-5  0 - 5 RBC/hpf   Bacteria, UA RARE (A) NONE SEEN  Pregnancy, urine     Status: None   Collection Time: 09/23/16  1:56 PM  Result Value Ref Range   Preg Test, Ur NEGATIVE NEGATIVE  Wet prep, genital     Status: Abnormal   Collection Time: 09/23/16  3:32 PM  Result Value Ref Range   Yeast Wet Prep HPF POC NONE SEEN NONE SEEN   Trich, Wet Prep NONE SEEN NONE SEEN   Clue Cells Wet Prep HPF POC PRESENT (A) NONE SEEN   WBC, Wet Prep HPF POC FEW (A) NONE SEEN   Sperm NONE SEEN   Comprehensive metabolic panel     Status: None   Collection Time: 09/23/16  3:43 PM  Result Value Ref Range   Sodium 138 135 - 145 mmol/L   Potassium 4.2 3.5 - 5.1 mmol/L   Chloride 105 101 - 111 mmol/L   CO2 26 22 - 32 mmol/L   Glucose, Bld 89 65 - 99 mg/dL   BUN 15 6 - 20 mg/dL   Creatinine, Ser 1.610.76 0.44 - 1.00 mg/dL   Calcium 8.9 8.9 - 09.610.3 mg/dL   Total Protein 7.4 6.5 - 8.1 g/dL   Albumin 3.9 3.5 - 5.0 g/dL   AST 23 15 - 41 U/L   ALT 29 14 - 54 U/L   Alkaline Phosphatase 54 38 - 126 U/L   Total Bilirubin 0.6 0.3 - 1.2 mg/dL   GFR calc non Af Amer >60 >60 mL/min   GFR calc Af Amer >60 >60 mL/min   Anion gap 7 5 - 15  Lipase, blood     Status: None   Collection Time: 09/23/16  3:43 PM  Result Value Ref Range   Lipase 40 11 - 51 U/L  CBC with Differential     Status: None   Collection Time: 09/23/16  3:43 PM  Result Value Ref Range   WBC 9.1 3.6 - 11.0 K/uL   RBC 5.17 3.80 - 5.20 MIL/uL   Hemoglobin 15.0 12.0 - 16.0 g/dL   HCT 04.544.9 40.935.0 - 81.147.0 %   MCV 86.7 80.0 - 100.0 fL   MCH 29.0 26.0 -  34.0 pg   MCHC 33.4 32.0 - 36.0 g/dL   RDW 81.1 91.4 - 78.2 %   Platelets 319 150 - 440 K/uL   Neutrophils Relative % 60 %   Neutro Abs 5.5 1.4 - 6.5 K/uL   Lymphocytes Relative 28 %   Lymphs Abs 2.6 1.0 - 3.6 K/uL   Monocytes Relative 8 %   Monocytes Absolute 0.7 0.2 - 0.9 K/uL   Eosinophils Relative 3 %   Eosinophils Absolute 0.3 0 - 0.7 K/uL   Basophils Relative 1 %   Basophils Absolute 0.1 0 - 0.1  K/uL   Dg Lumbar Spine Complete  Result Date: 09/23/2016 CLINICAL DATA:  L5-S1 bony tenderness. Evaluate for fracture or other acute finding. Severe back pain started this morning. EXAM: LUMBAR SPINE - COMPLETE 4+ VIEW COMPARISON:  Abdominal CT 01/03/2014 FINDINGS: There is no evidence of lumbar spine fracture. Alignment is normal. Intervertebral disc spaces are maintained. Unremarkable sacroiliac joints. Changes of cholecystectomy and bowel surgery. IMPRESSION: Negative lumbar spine. Electronically Signed   By: Marnee Spring M.D.   On: 09/23/2016 16:16    ED Clinical Impression  Acute left-sided low back pain without sciatica  Bacterial vaginosis   ED Assessment/Plan  Patient allergic to codeine and hydrocodone, reports headache and nausea. Cannot take NSAIDs due to gastric bypass surgery.  [place] narcotic database reviewed. Pt with no narcotic rx in the past 6 months.    No evidence of uti, nephrolithiasis. Doubt ectopic pregnancy.  No evidence of spinal cord involvement based on H&P. Pt describing typical back pain, has been < 6 week duration. No historical red flags as noted in HPI. No physical red flags such as fever, lower extremity weakness, saddle anesthesia.   However given the bony tenderness we'll image her L-spine. Doubt ruptured cyst, PID as she has no peritoneal signs or CMT. We'll check a wet prep, CBC, CMP, lipase and give patient a shot of Toradol.  Patient refused Toradol. CMP, lipase, CBC normal. Wet prep positive for BV could explain her urinary symptoms. No Trichomonas.  Imaging independently reviewed. Normal L-spine. See radiology report for details.  No new findings on re-exam. Pt ambulatory in the ED. Home with tramadol, Tylenol, Flagyl.  Pt to f/u with PMD.  she is to go to the ER if she gets worse in any way. Discussed labs, imaging, medical decision-making, and plan for follow-up with the patient.  Discussed signs and symptoms that should prompt return to the  emergency department.  Patient agrees with plan.  Meds ordered this encounter  Medications  . letrozole (FEMARA) 2.5 MG tablet    Sig: Take 2.5 mg by mouth daily.  . norethindrone (AYGESTIN) 5 MG tablet    Sig: Take 5 mg by mouth daily.  . Prenatal Multivit-Min-Fe-FA (PRENATAL VITAMINS PO)    Sig: Take 1 tablet by mouth daily.  Marland Kitchen DISCONTD: ketorolac (TORADOL) injection 30 mg  . metroNIDAZOLE (FLAGYL) 500 MG tablet    Sig: Take 1 tablet (500 mg total) by mouth 2 (two) times daily.    Dispense:  14 tablet    Refill:  0  . metaxalone (SKELAXIN) 800 MG tablet    Sig: Take 1 tablet (800 mg total) by mouth 3 (three) times daily.    Dispense:  21 tablet    Refill:  0  . traMADol (ULTRAM) 50 MG tablet    Sig: Take 1 tablet (50 mg total) by mouth every 6 (six) hours as needed.    Dispense:  20 tablet    Refill:  0    *This clinic note was created using Scientist, clinical (histocompatibility and immunogenetics). Therefore, there may be occasional mistakes despite careful proofreading.  ?    Domenick Gong, MD 09/23/16 1659

## 2017-08-18 ENCOUNTER — Encounter (HOSPITAL_COMMUNITY): Payer: Self-pay | Admitting: *Deleted

## 2017-08-18 ENCOUNTER — Inpatient Hospital Stay (HOSPITAL_COMMUNITY)
Admission: AD | Admit: 2017-08-18 | Discharge: 2017-08-21 | DRG: 786 | Disposition: A | Discharge status: Home / Self Care | Payer: BLUE CROSS/BLUE SHIELD | Source: Ambulatory Visit | Attending: Obstetrics & Gynecology | Admitting: Obstetrics & Gynecology

## 2017-08-18 ENCOUNTER — Inpatient Hospital Stay (HOSPITAL_COMMUNITY): Payer: BLUE CROSS/BLUE SHIELD | Admitting: Anesthesiology

## 2017-08-18 ENCOUNTER — Other Ambulatory Visit: Payer: Self-pay

## 2017-08-18 ENCOUNTER — Encounter (HOSPITAL_COMMUNITY)
Admission: AD | Disposition: A | Discharge status: Home / Self Care | Payer: Self-pay | Source: Ambulatory Visit | Attending: Obstetrics & Gynecology

## 2017-08-18 DIAGNOSIS — F329 Major depressive disorder, single episode, unspecified: Secondary | ICD-10-CM | POA: Diagnosis present

## 2017-08-18 DIAGNOSIS — E669 Obesity, unspecified: Secondary | ICD-10-CM | POA: Diagnosis present

## 2017-08-18 DIAGNOSIS — O99824 Streptococcus B carrier state complicating childbirth: Secondary | ICD-10-CM | POA: Diagnosis present

## 2017-08-18 DIAGNOSIS — O9089 Other complications of the puerperium, not elsewhere classified: Secondary | ICD-10-CM | POA: Diagnosis present

## 2017-08-18 DIAGNOSIS — O9962 Diseases of the digestive system complicating childbirth: Secondary | ICD-10-CM | POA: Diagnosis present

## 2017-08-18 DIAGNOSIS — O99844 Bariatric surgery status complicating childbirth: Secondary | ICD-10-CM | POA: Diagnosis present

## 2017-08-18 DIAGNOSIS — Z880 Allergy status to penicillin: Secondary | ICD-10-CM

## 2017-08-18 DIAGNOSIS — O9081 Anemia of the puerperium: Secondary | ICD-10-CM | POA: Diagnosis not present

## 2017-08-18 DIAGNOSIS — O99344 Other mental disorders complicating childbirth: Secondary | ICD-10-CM | POA: Diagnosis present

## 2017-08-18 DIAGNOSIS — D62 Acute posthemorrhagic anemia: Secondary | ICD-10-CM | POA: Diagnosis not present

## 2017-08-18 DIAGNOSIS — O329XX0 Maternal care for malpresentation of fetus, unspecified, not applicable or unspecified: Secondary | ICD-10-CM

## 2017-08-18 DIAGNOSIS — F32A Depression, unspecified: Secondary | ICD-10-CM | POA: Diagnosis present

## 2017-08-18 DIAGNOSIS — K219 Gastro-esophageal reflux disease without esophagitis: Secondary | ICD-10-CM | POA: Diagnosis present

## 2017-08-18 DIAGNOSIS — Z9884 Bariatric surgery status: Secondary | ICD-10-CM

## 2017-08-18 DIAGNOSIS — O42913 Preterm premature rupture of membranes, unspecified as to length of time between rupture and onset of labor, third trimester: Secondary | ICD-10-CM | POA: Diagnosis present

## 2017-08-18 DIAGNOSIS — O321XX0 Maternal care for breech presentation, not applicable or unspecified: Secondary | ICD-10-CM | POA: Diagnosis present

## 2017-08-18 DIAGNOSIS — Z3A36 36 weeks gestation of pregnancy: Secondary | ICD-10-CM

## 2017-08-18 DIAGNOSIS — O99214 Obesity complicating childbirth: Secondary | ICD-10-CM | POA: Diagnosis present

## 2017-08-18 DIAGNOSIS — R21 Rash and other nonspecific skin eruption: Secondary | ICD-10-CM | POA: Diagnosis present

## 2017-08-18 DIAGNOSIS — F419 Anxiety disorder, unspecified: Secondary | ICD-10-CM | POA: Diagnosis present

## 2017-08-18 DIAGNOSIS — O42919 Preterm premature rupture of membranes, unspecified as to length of time between rupture and onset of labor, unspecified trimester: Secondary | ICD-10-CM

## 2017-08-18 LAB — CBC
HCT: 38.2 % (ref 36.0–46.0)
Hemoglobin: 13.1 g/dL (ref 12.0–15.0)
MCH: 29.6 pg (ref 26.0–34.0)
MCHC: 34.3 g/dL (ref 30.0–36.0)
MCV: 86.2 fL (ref 78.0–100.0)
Platelets: 248 10*3/uL (ref 150–400)
RBC: 4.43 MIL/uL (ref 3.87–5.11)
RDW: 12.8 % (ref 11.5–15.5)
WBC: 10.1 10*3/uL (ref 4.0–10.5)

## 2017-08-18 LAB — ABO/RH: ABO/RH(D): AB POS

## 2017-08-18 LAB — TYPE AND SCREEN
ABO/RH(D): AB POS
Antibody Screen: NEGATIVE

## 2017-08-18 SURGERY — Surgical Case
Anesthesia: Spinal

## 2017-08-18 MED ORDER — PANTOPRAZOLE SODIUM 40 MG PO TBEC: 40.0000 mg | DELAYED_RELEASE_TABLET | Freq: Every day | ORAL | Status: DC

## 2017-08-18 MED ORDER — ACETAMINOPHEN 325 MG PO TABS
650.0000 mg | ORAL_TABLET | ORAL | Status: DC | PRN
  Administered 2017-08-18 – 2017-08-21 (×6): 650 mg via ORAL
  Filled 2017-08-18 (×6): qty 2

## 2017-08-18 MED ORDER — LACTATED RINGERS IV SOLN
INTRAVENOUS | Status: DC
  Administered 2017-08-18 (×2): via INTRAVENOUS

## 2017-08-18 MED ORDER — MORPHINE SULFATE (PF) 0.5 MG/ML IJ SOLN
INTRAMUSCULAR | Status: AC
  Filled 2017-08-18: qty 10

## 2017-08-18 MED ORDER — FENTANYL CITRATE (PF) 100 MCG/2ML IJ SOLN
INTRAMUSCULAR | Status: AC
  Filled 2017-08-18: qty 2

## 2017-08-18 MED ORDER — CITALOPRAM HYDROBROMIDE 40 MG PO TABS
40.0000 mg | ORAL_TABLET | Freq: Every day | ORAL | Status: DC
  Administered 2017-08-18 – 2017-08-20 (×3): 40 mg via ORAL
  Filled 2017-08-18 (×4): qty 1

## 2017-08-18 MED ORDER — PHENYLEPHRINE HCL 10 MG/ML IJ SOLN
INTRAMUSCULAR | Status: DC | PRN
  Administered 2017-08-18: 80 ug via INTRAVENOUS

## 2017-08-18 MED ORDER — BETAMETHASONE SOD PHOS & ACET 6 (3-3) MG/ML IJ SUSP
12.0000 mg | Freq: Once | INTRAMUSCULAR | Status: AC
  Administered 2017-08-18: 12 mg via INTRAMUSCULAR
  Filled 2017-08-18: qty 2

## 2017-08-18 MED ORDER — SODIUM CHLORIDE 0.9% FLUSH: 3.0000 mL | INTRAVENOUS | Status: DC | PRN

## 2017-08-18 MED ORDER — FENTANYL CITRATE (PF) 100 MCG/2ML IJ SOLN
INTRAMUSCULAR | Status: DC | PRN
  Administered 2017-08-18: 25 ug via INTRAVENOUS
  Administered 2017-08-18: 10 ug via INTRATHECAL

## 2017-08-18 MED ORDER — BUPIVACAINE IN DEXTROSE 0.75-8.25 % IT SOLN
INTRATHECAL | Status: DC | PRN
  Administered 2017-08-18: 1.6 mL via INTRATHECAL

## 2017-08-18 MED ORDER — SIMETHICONE 80 MG PO CHEW
80.0000 mg | CHEWABLE_TABLET | ORAL | Status: DC | PRN
  Administered 2017-08-20: 80 mg via ORAL
  Filled 2017-08-18: qty 1

## 2017-08-18 MED ORDER — OXYCODONE-ACETAMINOPHEN 5-325 MG PO TABS
2.0000 | ORAL_TABLET | ORAL | Status: DC | PRN
  Administered 2017-08-20: 2 via ORAL
  Filled 2017-08-18: qty 2

## 2017-08-18 MED ORDER — SIMETHICONE 80 MG PO CHEW
80.0000 mg | CHEWABLE_TABLET | ORAL | Status: DC
  Administered 2017-08-19 – 2017-08-20 (×3): 80 mg via ORAL
  Filled 2017-08-18 (×3): qty 1

## 2017-08-18 MED ORDER — PHENYLEPHRINE 8 MG IN D5W 100 ML (0.08MG/ML) PREMIX OPTIME
INJECTION | INTRAVENOUS | Status: AC
  Filled 2017-08-18: qty 100

## 2017-08-18 MED ORDER — ONDANSETRON HCL 4 MG/2ML IJ SOLN
INTRAMUSCULAR | Status: DC | PRN
  Administered 2017-08-18: 4 mg via INTRAVENOUS

## 2017-08-18 MED ORDER — SIMETHICONE 80 MG PO CHEW
80.0000 mg | CHEWABLE_TABLET | Freq: Three times a day (TID) | ORAL | Status: DC
  Administered 2017-08-19 – 2017-08-21 (×6): 80 mg via ORAL
  Filled 2017-08-18 (×6): qty 1

## 2017-08-18 MED ORDER — MORPHINE SULFATE (PF) 0.5 MG/ML IJ SOLN
INTRAMUSCULAR | Status: DC | PRN
Start: 2017-08-18 — End: 2017-08-18
  Administered 2017-08-18: .2 mg via EPIDURAL

## 2017-08-18 MED ORDER — DIPHENHYDRAMINE HCL 25 MG PO CAPS
25.0000 mg | ORAL_CAPSULE | ORAL | Status: DC | PRN
  Filled 2017-08-18: qty 1

## 2017-08-18 MED ORDER — SOD CITRATE-CITRIC ACID 500-334 MG/5ML PO SOLN
30.0000 mL | Freq: Once | ORAL | Status: AC
  Administered 2017-08-18: 30 mL via ORAL
  Filled 2017-08-18: qty 15

## 2017-08-18 MED ORDER — ONDANSETRON HCL 4 MG/2ML IJ SOLN
INTRAMUSCULAR | Status: AC
  Filled 2017-08-18: qty 2

## 2017-08-18 MED ORDER — OXYTOCIN 10 UNIT/ML IJ SOLN
INTRAMUSCULAR | Status: AC
  Filled 2017-08-18: qty 4

## 2017-08-18 MED ORDER — NALOXONE HCL 4 MG/10ML IJ SOLN
1.0000 ug/kg/h | INTRAVENOUS | Status: DC | PRN
  Filled 2017-08-18: qty 5

## 2017-08-18 MED ORDER — SCOPOLAMINE 1 MG/3DAYS TD PT72: 1.0000 | MEDICATED_PATCH | Freq: Once | TRANSDERMAL | Status: DC

## 2017-08-18 MED ORDER — SCOPOLAMINE 1 MG/3DAYS TD PT72
MEDICATED_PATCH | TRANSDERMAL | Status: DC | PRN
  Administered 2017-08-18: 1 via TRANSDERMAL

## 2017-08-18 MED ORDER — METOCLOPRAMIDE HCL 5 MG/ML IJ SOLN
INTRAMUSCULAR | Status: DC | PRN
  Administered 2017-08-18: 10 mg via INTRAVENOUS

## 2017-08-18 MED ORDER — MENTHOL 3 MG MT LOZG: 1.0000 | LOZENGE | OROMUCOSAL | Status: DC | PRN

## 2017-08-18 MED ORDER — PHENYLEPHRINE 8 MG IN D5W 100 ML (0.08MG/ML) PREMIX OPTIME
INJECTION | INTRAVENOUS | Status: DC | PRN
  Administered 2017-08-18: 60 ug/min via INTRAVENOUS

## 2017-08-18 MED ORDER — PRENATAL MULTIVITAMIN CH
1.0000 | ORAL_TABLET | Freq: Every day | ORAL | Status: DC
  Administered 2017-08-18 – 2017-08-20 (×3): 1 via ORAL
  Filled 2017-08-18 (×3): qty 1

## 2017-08-18 MED ORDER — OXYTOCIN 10 UNIT/ML IJ SOLN
INTRAVENOUS | Status: DC | PRN
  Administered 2017-08-18: 40 [IU] via INTRAVENOUS

## 2017-08-18 MED ORDER — MEPERIDINE HCL 25 MG/ML IJ SOLN: 6.2500 mg | INTRAMUSCULAR | Status: DC | PRN

## 2017-08-18 MED ORDER — CEFAZOLIN SODIUM-DEXTROSE 2-3 GM-%(50ML) IV SOLR
INTRAVENOUS | Status: DC | PRN
  Administered 2017-08-18: 2 g via INTRAVENOUS

## 2017-08-18 MED ORDER — PROMETHAZINE HCL 25 MG PO TABS: 12.5000 mg | ORAL_TABLET | Freq: Four times a day (QID) | ORAL | Status: DC | PRN

## 2017-08-18 MED ORDER — TETANUS-DIPHTH-ACELL PERTUSSIS 5-2.5-18.5 LF-MCG/0.5 IM SUSP: 0.5000 mL | Freq: Once | INTRAMUSCULAR | Status: DC

## 2017-08-18 MED ORDER — WITCH HAZEL-GLYCERIN EX PADS: 1.0000 "application " | MEDICATED_PAD | CUTANEOUS | Status: DC | PRN

## 2017-08-18 MED ORDER — DIPHENHYDRAMINE HCL 50 MG/ML IJ SOLN: 12.5000 mg | INTRAMUSCULAR | Status: DC | PRN

## 2017-08-18 MED ORDER — NALBUPHINE HCL 10 MG/ML IJ SOLN: 5.0000 mg | INTRAMUSCULAR | Status: DC | PRN

## 2017-08-18 MED ORDER — SENNOSIDES-DOCUSATE SODIUM 8.6-50 MG PO TABS
2.0000 | ORAL_TABLET | ORAL | Status: DC
  Administered 2017-08-19 – 2017-08-20 (×3): 2 via ORAL
  Filled 2017-08-18 (×3): qty 2

## 2017-08-18 MED ORDER — CEFAZOLIN SODIUM-DEXTROSE 2-4 GM/100ML-% IV SOLN
2.0000 g | INTRAVENOUS | Status: DC
  Filled 2017-08-18: qty 100

## 2017-08-18 MED ORDER — LACTATED RINGERS IV SOLN
INTRAVENOUS | Status: DC
  Administered 2017-08-18 (×2): via INTRAVENOUS

## 2017-08-18 MED ORDER — NALBUPHINE HCL 10 MG/ML IJ SOLN: 5.0000 mg | Freq: Once | INTRAMUSCULAR | Status: DC | PRN

## 2017-08-18 MED ORDER — NALOXONE HCL 0.4 MG/ML IJ SOLN: 0.4000 mg | INTRAMUSCULAR | Status: DC | PRN

## 2017-08-18 MED ORDER — ONDANSETRON HCL 4 MG/2ML IJ SOLN: 4.0000 mg | Freq: Three times a day (TID) | INTRAMUSCULAR | Status: DC | PRN

## 2017-08-18 MED ORDER — COCONUT OIL OIL
1.0000 "application " | TOPICAL_OIL | Status: DC | PRN
  Filled 2017-08-18: qty 120

## 2017-08-18 MED ORDER — DIBUCAINE 1 % RE OINT: 1.0000 "application " | TOPICAL_OINTMENT | RECTAL | Status: DC | PRN

## 2017-08-18 MED ORDER — ZOLPIDEM TARTRATE 5 MG PO TABS: 5.0000 mg | ORAL_TABLET | Freq: Every evening | ORAL | Status: DC | PRN

## 2017-08-18 MED ORDER — LORATADINE 10 MG PO TABS
10.0000 mg | ORAL_TABLET | Freq: Every day | ORAL | Status: DC
  Administered 2017-08-18 – 2017-08-20 (×3): 10 mg via ORAL
  Filled 2017-08-18 (×3): qty 1

## 2017-08-18 MED ORDER — PROMETHAZINE HCL 25 MG/ML IJ SOLN: 6.2500 mg | INTRAMUSCULAR | Status: DC | PRN

## 2017-08-18 MED ORDER — FAMOTIDINE IN NACL 20-0.9 MG/50ML-% IV SOLN
20.0000 mg | Freq: Once | INTRAVENOUS | Status: AC
  Administered 2017-08-18: 20 mg via INTRAVENOUS
  Filled 2017-08-18: qty 50

## 2017-08-18 MED ORDER — OXYCODONE-ACETAMINOPHEN 5-325 MG PO TABS
1.0000 | ORAL_TABLET | ORAL | Status: DC | PRN
  Administered 2017-08-19 – 2017-08-20 (×4): 1 via ORAL
  Filled 2017-08-18 (×4): qty 1

## 2017-08-18 MED ORDER — DIPHENHYDRAMINE HCL 25 MG PO CAPS: 25.0000 mg | ORAL_CAPSULE | Freq: Four times a day (QID) | ORAL | Status: DC | PRN

## 2017-08-18 MED ORDER — LACTATED RINGERS IV BOLUS (SEPSIS)
1000.0000 mL | Freq: Once | INTRAVENOUS | Status: AC
  Administered 2017-08-18: 1000 mL via INTRAVENOUS

## 2017-08-18 MED ORDER — PHENYLEPHRINE 40 MCG/ML (10ML) SYRINGE FOR IV PUSH (FOR BLOOD PRESSURE SUPPORT)
PREFILLED_SYRINGE | INTRAVENOUS | Status: AC
  Filled 2017-08-18: qty 10

## 2017-08-18 MED ORDER — OXYTOCIN 40 UNITS IN LACTATED RINGERS INFUSION - SIMPLE MED: 2.5000 [IU]/h | INTRAVENOUS | Status: DC

## 2017-08-18 MED ORDER — LACTATED RINGERS IV SOLN
INTRAVENOUS | Status: DC | PRN
  Administered 2017-08-18: 07:00:00 via INTRAVENOUS

## 2017-08-18 MED ORDER — SODIUM CHLORIDE 0.9 % IR SOLN
Status: DC | PRN
  Administered 2017-08-18: 1

## 2017-08-18 MED ORDER — DEXAMETHASONE SODIUM PHOSPHATE 4 MG/ML IJ SOLN
INTRAMUSCULAR | Status: DC | PRN
  Administered 2017-08-18: 4 mg via INTRAVENOUS
  Administered 2017-08-18: 10 mg via INTRAVENOUS

## 2017-08-18 SURGICAL SUPPLY — 35 items
BENZOIN TINCTURE PRP APPL 2/3 (GAUZE/BANDAGES/DRESSINGS) ×2 IMPLANT
BLADE SURG 10 STRL SS (BLADE) ×2 IMPLANT
CHLORAPREP W/TINT 26ML (MISCELLANEOUS) ×2 IMPLANT
CLAMP CORD UMBIL (MISCELLANEOUS) IMPLANT
CLOSURE STERI STRIP 1/2 X4 (GAUZE/BANDAGES/DRESSINGS) ×2 IMPLANT
CLOTH BEACON ORANGE TIMEOUT ST (SAFETY) ×2 IMPLANT
DRSG OPSITE POSTOP 4X10 (GAUZE/BANDAGES/DRESSINGS) ×2 IMPLANT
ELECT REM PT RETURN 9FT ADLT (ELECTROSURGICAL) ×2
ELECTRODE REM PT RTRN 9FT ADLT (ELECTROSURGICAL) ×1 IMPLANT
EXTRACTOR VACUUM M CUP 4 TUBE (SUCTIONS) IMPLANT
GLOVE BIO SURGEON STRL SZ 6.5 (GLOVE) ×2 IMPLANT
GLOVE BIOGEL PI IND STRL 7.0 (GLOVE) ×2 IMPLANT
GLOVE BIOGEL PI INDICATOR 7.0 (GLOVE) ×2
GOWN STRL REUS W/TWL LRG LVL3 (GOWN DISPOSABLE) ×4 IMPLANT
KIT ABG SYR 3ML LUER SLIP (SYRINGE) IMPLANT
NEEDLE HYPO 22GX1.5 SAFETY (NEEDLE) IMPLANT
NEEDLE HYPO 25X5/8 SAFETYGLIDE (NEEDLE) IMPLANT
NS IRRIG 1000ML POUR BTL (IV SOLUTION) ×2 IMPLANT
PACK C SECTION WH (CUSTOM PROCEDURE TRAY) ×2 IMPLANT
PAD OB MATERNITY 4.3X12.25 (PERSONAL CARE ITEMS) ×2 IMPLANT
PENCIL SMOKE EVAC W/HOLSTER (ELECTROSURGICAL) ×2 IMPLANT
STRIP CLOSURE SKIN 1/2X4 (GAUZE/BANDAGES/DRESSINGS) IMPLANT
SUT MON AB 4-0 PS1 27 (SUTURE) ×2 IMPLANT
SUT PLAIN 0 NONE (SUTURE) IMPLANT
SUT PLAIN 2 0 XLH (SUTURE) ×2 IMPLANT
SUT VIC AB 0 CT1 36 (SUTURE) ×4 IMPLANT
SUT VIC AB 0 CTX 36 (SUTURE) ×2
SUT VIC AB 0 CTX36XBRD ANBCTRL (SUTURE) ×2 IMPLANT
SUT VIC AB 2-0 CT1 27 (SUTURE) ×1
SUT VIC AB 2-0 CT1 TAPERPNT 27 (SUTURE) ×1 IMPLANT
SUT VIC AB 4-0 KS 27 (SUTURE) ×2 IMPLANT
SYR CONTROL 10ML LL (SYRINGE) IMPLANT
TOWEL OR 17X24 6PK STRL BLUE (TOWEL DISPOSABLE) ×2 IMPLANT
TRAY FOLEY BAG SILVER LF 14FR (SET/KITS/TRAYS/PACK) IMPLANT
WATER STERILE IRR 1000ML POUR (IV SOLUTION) ×2 IMPLANT

## 2017-08-18 NOTE — Anesthesia Procedure Notes (Signed)
Spinal  Patient location during procedure: OB Start time: 08/18/2017 6:46 AM End time: 08/18/2017 6:51 AM Staffing Anesthesiologist: Lowella CurbMiller, Rianna Lukes Ray, MD Performed: anesthesiologist  Preanesthetic Checklist Completed: patient identified, surgical consent, pre-op evaluation, timeout performed, IV checked, risks and benefits discussed and monitors and equipment checked Spinal Block Patient position: sitting Prep: site prepped and draped and DuraPrep Patient monitoring: heart rate, cardiac monitor, continuous pulse ox and blood pressure Approach: midline Location: L3-4 Injection technique: single-shot Needle Needle type: Pencan  Needle gauge: 24 G Needle length: 10 cm Assessment Sensory level: T4

## 2017-08-18 NOTE — Anesthesia Preprocedure Evaluation (Signed)
Anesthesia Evaluation  Patient identified by MRN, date of birth, ID band Patient awake    Reviewed: Allergy & Precautions, NPO status , Patient's Chart, lab work & pertinent test results  History of Anesthesia Complications (+) PONV and history of anesthetic complications  Airway Mallampati: III  TM Distance: >3 FB Neck ROM: Full    Dental no notable dental hx. (+) Chipped   Pulmonary asthma , sleep apnea ,    Pulmonary exam normal breath sounds clear to auscultation       Cardiovascular Normal cardiovascular exam Rhythm:Regular Rate:Normal     Neuro/Psych  Headaches, PSYCHIATRIC DISORDERS Anxiety Depression    GI/Hepatic hiatal hernia, GERD  ,  Endo/Other    Renal/GU      Musculoskeletal   Abdominal   Peds  Hematology   Anesthesia Other Findings   Reproductive/Obstetrics (+) Pregnancy                             Anesthesia Physical  Anesthesia Plan  ASA: II  Anesthesia Plan: Spinal   Post-op Pain Management:    Induction: Intravenous  PONV Risk Score and Plan: Treatment may vary due to age or medical condition  Airway Management Planned: Natural Airway  Additional Equipment:   Intra-op Plan:   Post-operative Plan:   Informed Consent: I have reviewed the patients History and Physical, chart, labs and discussed the procedure including the risks, benefits and alternatives for the proposed anesthesia with the patient or authorized representative who has indicated his/her understanding and acceptance.     Plan Discussed with: CRNA  Anesthesia Plan Comments:         Anesthesia Quick Evaluation

## 2017-08-18 NOTE — Anesthesia Postprocedure Evaluation (Signed)
Anesthesia Post Note  Patient: Mikeal HawthorneJessica B Kirkland  Procedure(s) Performed: Primary CESAREAN SECTION (N/A )     Patient location during evaluation: PACU Anesthesia Type: Spinal Level of consciousness: awake and alert and oriented Pain management: pain level controlled Vital Signs Assessment: post-procedure vital signs reviewed and stable Respiratory status: nonlabored ventilation, spontaneous breathing and respiratory function stable Cardiovascular status: blood pressure returned to baseline and stable Postop Assessment: no headache, no backache, spinal receding, patient able to bend at knees and no apparent nausea or vomiting Anesthetic complications: no    Last Vitals:  Vitals:   08/18/17 0830 08/18/17 0845  BP: 109/72 107/78  Pulse: 65 66  Resp: 15 18  Temp:    SpO2: 100% 96%    Last Pain:  Vitals:   08/18/17 0845  TempSrc:   PainSc: 0-No pain   Pain Goal:                 Tyauna Lacaze A.

## 2017-08-18 NOTE — Anesthesia Postprocedure Evaluation (Signed)
Anesthesia Post Note  Patient: Kimberly HawthorneJessica B Santiago  Procedure(s) Performed: Primary CESAREAN SECTION (N/A )     Patient location during evaluation: Mother Baby Anesthesia Type: Spinal Level of consciousness: awake Pain management: satisfactory to patient Vital Signs Assessment: post-procedure vital signs reviewed and stable Respiratory status: spontaneous breathing Cardiovascular status: stable Anesthetic complications: no    Last Vitals:  Vitals:   08/18/17 1240 08/18/17 1639  BP: 114/70 (!) 106/58  Pulse: (!) 57 (!) 57  Resp: 17 17  Temp: 36.4 C 36.6 C  SpO2: 97% 98%    Last Pain:  Vitals:   08/18/17 1639  TempSrc: Oral  PainSc:    Pain Goal:                 KeyCorpBURGER,Katrina Daddona

## 2017-08-18 NOTE — Consult Note (Signed)
Neonatology Note:   Attendance at C-section:    I was asked by Dr. Juliene PinaMody to attend this primary C/S at 36 2/[redacted] weeks EGA for breech presentation having presented with SROM. The mother is a G1 via IVF, GBS + (no prophylactic abx given) with good prenatal care complicated by PCOS on Metformin, AMA, and anxiety/depression on Celexa.  ROM 4 hours before delivery, fluid clear. Infant vigorous with good spontaneous cry and tone. After +60 sec DCC, brought to warmer and warm, dried, and stimulated.  Comfortable respiratory effort; quiet crier. Did minimal bulb suctioning. Infant did not pink up as expected, so placed SaO2 and relatively low so began blow by oxygen.  Weaned gradually with need for replacement.  At ~3214min, gave ~2-3 min course of CPAP for lung recruitment, fio2 nil.  Sao2 maintained in 90s with comfortable respirations. Monitored until ~22 min and clinically stable on RA.  Ap 7/9. Lungs clear to ausc in DR. D/w with parents risk factors associated with prematurity and that NBN staff would help monitor to ensure continued appropriate transitioning.  To CN to care of Pediatrician.  Dineen Kidavid C. Leary RocaEhrmann, MD 08/18/17 0740    Clinical update:  Called back to OR for infant with desaturations.  Was with Mom doing skin to skin and noted to gradually have poor color.  Sao2 placed and in upper 70 to low 80s and was back on BBO2.  D/w parents need for transfer to NICU Transition unit for support and monitoring.  The expressed understanding and agreement with management plan.   Jamie Brookesavid Sandrina Heaton, MD 08/18/17 at 0800

## 2017-08-18 NOTE — MAU Provider Note (Signed)
Chief Complaint:  Rupture of Membranes   First Provider Initiated Contact with Patient 08/18/17 0511     HPI: Kimberly Santiago is a 42 y.o. No obstetric history on file. at [facility] presents to maternity admissions reporting ruptured membranes since 0330.  Large amount leaking out.  Reportedly breech as of last week and decided on Cesarean delivery. . She reports good fetal movement, denies LOF, vaginal bleeding, vaginal itching/burning, urinary symptoms, h/a, dizziness, n/v, diarrhea, constipation or fever/chills.  She denies headache, visual changes or RUQ abdominal pain.  Vaginal Discharge  The patient's primary symptoms include vaginal discharge. The patient's pertinent negatives include no genital itching, genital lesions, genital odor or vaginal bleeding. This is a new problem. The current episode started today. The problem occurs constantly. She is pregnant. Pertinent negatives include no abdominal pain, back pain, constipation, diarrhea, fever or vomiting. The vaginal discharge was clear and watery. There has been no bleeding. She has not been passing clots. She has not been passing tissue. Nothing aggravates the symptoms. She has tried nothing for the symptoms.    RN Note: Pt reports gush of fluid @ 0330. Pt reports mild nausea and very mild cramping. Denies Bleeding. Denies FM    Past Medical History: Past Medical History:  Diagnosis Date  . Asthma   . Barrett esophagus   . Complication of anesthesia    Pt stopped breathing during EGD but did well with recent Gastric Bypass Surgery  . Depression   . GERD (gastroesophageal reflux disease)   . Headache   . History of hiatal hernia   . PONV (postoperative nausea and vomiting)   . Sleep apnea     Past obstetric history: OB History  No data available    Past Surgical History: Past Surgical History:  Procedure Laterality Date  . CHOLECYSTECTOMY    . DIAGNOSTIC LAPAROSCOPY    . EAR CYST EXCISION N/A 11/24/2014    Procedure: CYST REMOVAL;  Surgeon: Bud Face, MD;  Location: [facility] ORS;  Service: ENT;  Laterality: N/A;  upper lip  . ESOPHAGOGASTRODUODENOSCOPY    . GASTRIC BYPASS  March 2016  . KNEE ARTHROSCOPY Right   . TEMPOROMANDIBULAR JOINT SURGERY    . WISDOM TOOTH EXTRACTION      Family History: No family history on file.  Social History: Social History   Tobacco Use  . Smoking status: Never Smoker  . Smokeless tobacco: Never Used  Substance Use Topics  . Alcohol use: No  . Drug use: No    Allergies:  Allergies  Allergen Reactions  . Amoxicillin Other (See Comments)  . Codeine Other (See Comments)  . Hydrocodone Other (See Comments)  . Nsaids Other (See Comments)    Pt cannot take due to Gastric Bypass Surgery    Meds:  Medications Prior to Admission  Medication Sig Dispense Refill Last Dose  . Calcium Carbonate-Vitamin D (CALCIUM + D PO) Take 1 tablet by mouth at bedtime.   11/23/2014 at Unknown time  . citalopram (CELEXA) 40 MG tablet Take 40 mg by mouth every morning.   11/24/2014 at Unknown time  . letrozole (FEMARA) 2.5 MG tablet Take 2.5 mg by mouth daily.     Marland Kitchen loratadine (CLARITIN) 10 MG tablet Take 10 mg by mouth at bedtime.   11/23/2014 at Unknown time  . metaxalone (SKELAXIN) 800 MG tablet Take 1 tablet (800 mg total) by mouth 3 (three) times daily. 21 tablet 0   . norethindrone (AYGESTIN) 5 MG tablet Take 5 mg by  mouth daily.     . pantoprazole (PROTONIX) 40 MG tablet Take 40 mg by mouth at bedtime.   11/24/2014 at Unknown time  . Prenatal Multivit-Min-Fe-FA (PRENATAL VITAMINS PO) Take 1 tablet by mouth daily.     . promethazine (PHENERGAN) 12.5 MG tablet Take 1 tablet (12.5 mg total) by mouth every 6 (six) hours as needed for nausea or vomiting. 30 tablet 0   . traMADol (ULTRAM) 50 MG tablet Take 1 tablet (50 mg total) by mouth every 6 (six) hours as needed. 20 tablet 0   . UNABLE TO FIND Med Name: Vitamin B 12 Nasal Spray once/week   Past Week at Unknown time     I have reviewed patient's Past Medical Hx, Surgical Hx, Family Hx, Social Hx, medications and allergies.   ROS:  Review of Systems  Constitutional: Negative for fever.  Gastrointestinal: Negative for abdominal pain, constipation, diarrhea and vomiting.  Genitourinary: Positive for vaginal discharge.  Musculoskeletal: Negative for back pain.   Other systems negative  Physical Exam   Constitutional: Well-developed, well-nourished female in no acute distress.  Cardiovascular: normal rate and rhythm Respiratory: normal effort, clear to auscultation bilaterally GI: Abd soft, non-tender, gravid appropriate for gestational age.   No rebound or guarding. MS: Extremities nontender, no edema, normal ROM Neurologic: Alert and oriented x 4.  GU: Neg CVAT.  PELVIC EXAM:  Gross pooling of clear fluid.   Cervix not examined  FHT:  Baseline 140 , moderate variability, accelerations present, no decelerations Contractions:   Rare   Labs: No results found for this or any previous visit (from the past 24 hour(s)).     Imaging:  Bedside US done by me Fetal lie: Longitudinal Fetal presentation:   Breech, appears complete Fetal head:  Upper uterus, central Some pockets of fluid around baby  MAU Course/MDM: Admission orders placed by RN  NST reviewed and found to be reactive Consult Dr Juliene PinaMody with presentation, exam findings and test results.  Treatments in MAU included Betamethasone, IV and preoperative prep  Ancef ordered for GBS.    Assessment: Single IUP at 8268w2d Preterm premature rupture of membranes Not in labor Breech presentation. IVF conception  Plan: Admit to OR Cesarean Delivery by Dr Juliene PinaMody who is on the way.  Wynelle BourgeoisMarie Dashton Czerwinski CNM, MSN Certified Nurse-Midwife 08/18/2017 5:12 AM

## 2017-08-18 NOTE — Addendum Note (Signed)
Addendum  created 08/18/17 1036 by Algis GreenhouseBurger, Lizet Kelso A, CRNA   Intraprocedure Event edited

## 2017-08-18 NOTE — Lactation Note (Signed)
This note was copied from a baby's chart. Lactation Consultation Note  Patient Name: Kimberly Santiago Reason for consult: Initial assessment;1st time breastfeeding;Primapara;Late-preterm 34-36.6wks;Difficult latch;Other (Comment)  11 hours old LPI female; he was transferred from NICU today. Mom is exclusively BF baby, she's a P1. He also got some donor milk in NICU. Mom started pumping and she already got 5 ml of colostrum. Tried latching baby on the breast without NS first, but attempts were unsuccessful, breast tissue is semi-compressible and hard for baby to latch on. Tried latching baby on with NS # 24 and he was able to latch and sustain the latch for 4 minutes before falling asleep.  Baby woke up again while burping him and showed parents how to finger feed baby with a curved tip syringe; baby has a good suck and finished all 5 ml of colostrum. Baby burped a some curd milk after feeding, very small amount, about 0.5 cc. It was mostly saliva.  Instructed mom to wake LPI up every 3 hours to feed and put him on the breast using the NS. Mom will be pumping after feedings, she said it was too painful to hand express; her nipples were really sore but they show no signs of trauma upon examination; mom is probably experiencing transient soreness. Milk storage guidelines were reviewed, parents will be feeding baby mom's fresh expressed breastmilk. Advised mom to rub coconut oil on breast and nipples prior pumping and do some breast massage.  Reviewed BF brochure, BF resources and feeding diary. Mom is aware of LC services and will call PRN.   Maternal Data Formula Feeding for Exclusion: No Has patient been taught Hand Expression?: Yes Does the patient have breastfeeding experience prior to this delivery?: No  Feeding Feeding Type: Breast Fed(with NS # 24) Length of feed: 4 min  LATCH Score Latch: Repeated attempts needed to sustain latch, nipple held in mouth throughout  feeding, stimulation needed to elicit sucking reflex.(with NS # 24)  Audible Swallowing: A few with stimulation  Type of Nipple: Everted at rest and after stimulation(short shaft nipples)  Comfort (Breast/Nipple): Filling, red/small blisters or bruises, mild/mod discomfort  Hold (Positioning): Assistance needed to correctly position infant at breast and maintain latch.  LATCH Score: 6  Interventions Interventions: Breast feeding basics reviewed;Assisted with latch;Skin to skin;Breast massage;Breast compression;Adjust position;Support pillows;Position options;Expressed milk;DEBP  Lactation Tools Discussed/Used Tools: Nipple Shields Nipple shield size: 24 WIC Program: No Pump Review: Milk Storage;Setup, frequency, and cleaning Initiated by:: RN Date initiated:: 08/18/17   Consult Status Consult Status: Follow-up Date: 08/19/17 Follow-up type: In-patient    Kimberly Santiago Santiago, 6:30 PM

## 2017-08-18 NOTE — Addendum Note (Signed)
Addendum  created 08/18/17 1709 by Algis GreenhouseBurger, Yosselyn Tax A, CRNA   Sign clinical note

## 2017-08-18 NOTE — H&P (Signed)
Kimberly HawthorneJessica B Santiago is a 42 y.o. female G1, 36.2 weeks by IVF conception, here for ruptured membranes, 3.45 am, clear fluid. Now having contractions. Denies bleeding. Reports normal fetal movements. Baby is breech. PCOS- on Metformin, stopped after 1st trim AMA IVF preg - normal PGS, Female. Normal anatomy and fetal growth sono x 2 in 3rd trim. No GDM.  Anxiety/ depression - on Celexa 40mg   GERD- Pantoprazole Gastric Bypass 2016.  Maternal obesity - home BS testing normal   OB History    Gravida Para Term Preterm AB Living   1             SAB TAB Ectopic Multiple Live Births                 Past Medical History:  Diagnosis Date  . Asthma   . Barrett esophagus   . Complication of anesthesia    Pt stopped breathing during EGD but did well with recent Gastric Bypass Surgery  . Depression   . GERD (gastroesophageal reflux disease)   . Headache   . History of hiatal hernia   . PONV (postoperative nausea and vomiting)   . Sleep apnea    Past Surgical History:  Procedure Laterality Date  . CHOLECYSTECTOMY    . DIAGNOSTIC LAPAROSCOPY    . EAR CYST EXCISION N/A 11/24/2014   Procedure: CYST REMOVAL;  Surgeon: Kimberly Facereighton Vaught, MD;  Location: [facility] ORS;  Service: ENT;  Laterality: N/A;  upper lip  . ESOPHAGOGASTRODUODENOSCOPY    . GASTRIC BYPASS  March 2016  . KNEE ARTHROSCOPY Right   . TEMPOROMANDIBULAR JOINT SURGERY    . WISDOM TOOTH EXTRACTION     Family History: family history is not on file. Social History:  reports that  has never smoked. she has never used smokeless tobacco. She reports that she does not drink alcohol or use drugs.     Maternal Diabetes: No Genetic Screening: Normal - PGS normal  Maternal Ultrasounds/Referrals: Normal- anatomy nl, growth nl, Breech Fetal Ultrasounds or other Referrals:  None Maternal Substance Abuse:  No Significant Maternal Medications:  Meds include: Other: Celexa 40mg , Pantoprazole, Metformin in 1st trim Significant Maternal Lab Results:   Lab values include: Group B Strep positive Other Comments:  None  ROS History   Blood pressure 132/80, pulse 63, temperature (!) 97.3 F (36.3 C), temperature source Oral, resp. rate 18, last menstrual period 09/15/2016. Exam Physical Exam  BP 132/80   Pulse 63   Temp (!) 97.3 F (36.3 C) (Oral)   Resp 18   LMP 09/15/2016 (Exact Date)  Physical exam:  A&O x 3, no acute distress. Pleasant HEENT neg, no thyromegaly Lungs CTA bilat CV RRR, S1S2 normal Abdo soft, non tender, non acute. Gravid uterus with contractions  Extr no edema/ tenderness Pelvic clear copious fluid FHT 130s Toco present but not consistent   Prenatal labs: ABO, Rh: --/--/AB POS (02/18 16100548) Antibody: PENDING (02/18 0548) Rubella:  Immune RPR:   NR HBsAg:   Neg HIV:   NEg GBS:   Positive   Assessment/Plan: 42 yo G1, IVF preg. SROM with Breech in early labor. One dose of Betameth now.  Proceed with C-section.  PCN allergy but has taken Keflex with no concerns, Ancef to OR  Risks/complications of surgery reviewed incl infection, bleeding, damage to internal organs including bladder, bowels, ureters, blood vessels, other risks from anesthesia, VTE and delayed complications of any surgery, complications in future surgery reviewed. Also discussed neonatal complications incl difficult delivery, laceration,  vacuum assistance, TTN etc. Pt understands and agrees, all concerns addressed.     Kimberly Santiago 08/18/2017, 6:20 AM

## 2017-08-18 NOTE — Progress Notes (Signed)
At orthostatic vital assessment and ambulation, patient IV tubing broke apart with blood backflow in and out of tubing. IV site not damaged, tubing contaminated & d/c from Pitocin, changed to Lactated Ringer's with new tubing.

## 2017-08-18 NOTE — MAU Note (Signed)
Pt reports gush of fluid @ 0330. Pt reports mild nausea and very mild cramping. Denies Bleeding. Denies FM.

## 2017-08-18 NOTE — Transfer of Care (Signed)
Immediate Anesthesia Transfer of Care Note  Patient: Kimberly HawthorneJessica B Santiago  Procedure(s) Performed: Primary CESAREAN SECTION (N/A )  Patient Location: PACU  Anesthesia Type:Spinal  Level of Consciousness: awake  Airway & Oxygen Therapy: Patient Spontanous Breathing  Post-op Assessment: Report given to RN  Post vital signs: Reviewed and stable  Last Vitals:  Vitals:   08/18/17 0513 08/18/17 0522  BP:  132/80  Pulse:  63  Resp: 18   Temp: (!) 36.3 C     Last Pain:  Vitals:   08/18/17 0513  TempSrc: Oral  PainSc: 1          Complications: No apparent anesthesia complications

## 2017-08-19 ENCOUNTER — Other Ambulatory Visit: Payer: Self-pay | Admitting: Obstetrics

## 2017-08-19 LAB — BIRTH TISSUE RECOVERY COLLECTION (PLACENTA DONATION)

## 2017-08-19 LAB — CBC
HCT: 29.4 % — ABNORMAL LOW (ref 36.0–46.0)
Hemoglobin: 10.2 g/dL — ABNORMAL LOW (ref 12.0–15.0)
MCH: 29.9 pg (ref 26.0–34.0)
MCHC: 34.7 g/dL (ref 30.0–36.0)
MCV: 86.2 fL (ref 78.0–100.0)
Platelets: 222 10*3/uL (ref 150–400)
RBC: 3.41 MIL/uL — ABNORMAL LOW (ref 3.87–5.11)
RDW: 12.8 % (ref 11.5–15.5)
WBC: 17.5 10*3/uL — ABNORMAL HIGH (ref 4.0–10.5)

## 2017-08-19 LAB — RPR: RPR Ser Ql: NONREACTIVE

## 2017-08-19 MED ORDER — POLYSACCHARIDE IRON COMPLEX 150 MG PO CAPS
150.0000 mg | ORAL_CAPSULE | Freq: Every day | ORAL | Status: DC
  Administered 2017-08-19 – 2017-08-21 (×3): 150 mg via ORAL
  Filled 2017-08-19 (×3): qty 1

## 2017-08-19 MED ORDER — MAGNESIUM OXIDE 400 (241.3 MG) MG PO TABS
400.0000 mg | ORAL_TABLET | Freq: Every day | ORAL | Status: DC
  Administered 2017-08-19 – 2017-08-21 (×3): 400 mg via ORAL
  Filled 2017-08-19 (×3): qty 1

## 2017-08-19 NOTE — Progress Notes (Addendum)
POSTOPERATIVE DAY # 1 S/P Primary LTCS for Breech with SROM, baby boy "Evan"  S:         Reports feeling well, has been having some burning on the right side of her incision             Tolerating po intake / no  nausea / no vomiting / + flatus / no BM  Denies dizziness, SOB, or CP             Bleeding is moderate             Pain controlled with Tylenol; cannot take NSAIDs due to gastric bypass             Up ad lib / ambulatory/ voiding QS  Newborn breast feeding and pumping with formula supplementation due to low colostrum and late preterm; has been using nipple shield, which has help with latch  / Circumcision - planning prior to discharge   O:  VS: BP (!) 107/56 (BP Location: Left Arm)   Pulse 65   Temp 98.7 F (37.1 C) (Oral)   Resp 18   Ht 5\' 7"  (1.702 m)   Wt 117.9 kg (260 lb)   LMP 09/15/2016 (Exact Date)   SpO2 97%   BMI 40.72 kg/m    LABS:               Recent Labs    08/18/17 0548 08/19/17 0533  WBC 10.1 17.5*  HGB 13.1 10.2*  PLT 248 222               Bloodtype: --/--/AB POS, AB POS Performed at [facility], [address], [place], [place] [address]  [phone] 0548)  Rubella:   Immune                                            I&O: Intake/Output      02/18 0701 - 02/19 0700 02/19 0701 - 02/20 0700   P.O. 960    I.V. (mL/kg) 2200 (18.7)    Total Intake(mL/kg) 3160 (26.8)    Urine (mL/kg/hr) 1925 (0.7)    Blood 790    Total Output 2715    Net +445                      Physical Exam:             Alert and Oriented X3  Lungs: Clear and unlabored  Heart: regular rate and rhythm / no murmurs  Abdomen: soft, non-tender, non-distended, hypoactive bowel sounds in all quadrants              Fundus: firm, non-tender, U-1             Dressing: honeycomb dsg with steri-strips: 75% saturated and not adherent at the bottom of dressing              Incision:  approximated with sutures / no erythema / no ecchymosis / old sanguinous drainage noted  Perineum:  intact  Lochia: small, no clots   Extremities: +1 BLE edema, no calf pain or tenderness,   A:        POD # 1 S/P Primary LTCS for breech with SROM            Mild ABL Anemia - asymptomatic   Hx. Of Anxiety/Depression - stable on  Celexa 40mg    Hx. Of Gastric Bypass in 2016 - no NSAIDs  P:        Routine postoperative care              Niferex 150mg  daily   Magnesium oxide 400mg  daily   Discussed continuing on scheduled Tylenol; Percocet PRN  Encouraged to continue to pump every 2-3 hours; massage and hand expression also discussed: plan to see lactation again today   Change honeycomb dressing today   Encouraged warm liquids and ambulation to promote flatus   Circumcision prior to discharge   Continue current care   Carlean JewsMeredith Maximilian Tallo, MSN, CNM [facility]

## 2017-08-19 NOTE — Lactation Note (Signed)
This note was copied from a baby's chart. Lactation Consultation Note  Patient Name: Kimberly Floydene FlockJessica Gerber ZOXWR'UToday's Date: 08/19/2017 Reason for consult: Follow-up assessment  Parents have already increased amounts of supplement for baby; baby is now 4134 hours old. Per mom she hasn't been pumping consistently, only pumped twice today but she said she'll give pumping another try. Recommended hand expression prior pumping for a better yield, she was getting frustrated because she didn't get any volume like she did the day before.   Parents now have baby on pacifier, explained about consequences of pacis masking feeding cues. Also spoke to mom about the need to supplement baby with Polyvisol once she starts feeding mostly breastmilk since mom had a Roux-en-Y bypass surgery in the past. Mom had already stopped taking her citrate tablets and Vit. B12 supplements, she was told by her RD that prenatal vitamins were sufficient during pregnancy to offset the nutrient deficiency caused by the procedure.  Mom will continue putting baby on the breast with NS and increasing volume as baby gets older, reviewed Feeding your LPI handout guidelines.  Feeding Feeding Type: Formula  Interventions Interventions: Breast feeding basics reviewed  Lactation Tools Discussed/Used     Consult Status Consult Status: Follow-up Date: 08/20/17 Follow-up type: In-patient    [name] Venetia ConstableS [name] 08/19/2017, 5:51 PM

## 2017-08-19 NOTE — Lactation Note (Signed)
This note was copied from a baby's chart. Lactation Consultation Note  Patient Name: Kimberly Floydene FlockJessica Gras UJWJX'BToday's Date: 08/19/2017   I called & spoke w/RN suggesting increased volume for supplementing this LPI. RN reported that she had told Mom to increase volumes & Mom has the volume parameters sheet.    [name], Laisa Larrick [facility] 08/19/2017, 4:08 PM

## 2017-08-20 MED ORDER — HYDROCORTISONE 1 % EX CREA
TOPICAL_CREAM | Freq: Three times a day (TID) | CUTANEOUS | Status: DC
  Administered 2017-08-20 – 2017-08-21 (×3): via TOPICAL
  Filled 2017-08-20: qty 28

## 2017-08-20 NOTE — Progress Notes (Signed)
POSTOPERATIVE DAY # 2 S/P CS - breech   S:         Reports feeling ok - some burning right lateral incision area (ice relieves)             Tolerating po intake / no nausea / no vomiting / no flatus / no BM             Bleeding is light             Pain controlled with oxycodone             Up ad lib / ambulatory/ voiding QS  Newborn Breast   O:  VS: BP 117/71 (BP Location: Left Arm)   Pulse 76   Temp 97.8 F (36.6 C) (Oral)   Resp 18   Ht 5\' 7"  (1.702 m)   Wt 117.9 kg (260 lb)   LMP 09/15/2016 (Exact Date)   SpO2 97%   BMI 40.72 kg/m    LABS:               Recent Labs    08/18/17 0548 08/19/17 0533  WBC 10.1 17.5*  HGB 13.1 10.2*  PLT 248 222               Physical Exam:             Alert and Oriented X3  Lungs: Clear and unlabored  Heart: regular rate and rhythm / no mumurs  Abdomen: soft, non-tender, non-distended, dependent panus without edema or erythema             Fundus: firm, non-tender, Ueven             Dressing intact honeycomb - erythema at right margin (shape of adhesive OR drape)                                                             reports hx reaction to steri-strips in past with rash             Incision:  approximated with suture / no erythema / no ecchymosis / no drainage  Perineum: intact  Lochia: light  Extremities: trace edema, no calf pain or tenderness, SCD in place  A:        POD # 2 S/P CS-breech            Hx gastric bypass (2016) - medication precautions in place            ABL anemia - stable / asymptomatic            HX anxiety and depression - stable on Celexa            Rash - likley contact dermatitis (hx adhesive reaction to steri-strips in past)  P:        Routine postoperative care              Topical ice or heat to skin area for comfort - burning sensation              Hydrocortisone for rash TID - consider removal adhesive at DC or sooner if symptoms appear             2-week follow-up recommended for  anxiety/depression      Kimberly Santiago  CNM, MSN, [facility] 08/20/2017, 9:21 AM

## 2017-08-20 NOTE — Lactation Note (Signed)
This note was copied from a baby's chart. Lactation Consultation Note  Patient Name: Kimberly Santiago ZOXWR'UToday's Date: 08/20/2017 Reason for consult: Follow-up assessment  Mom is pumping & she is pleased to be seeing some EBM.   LPI crib card placed in crib. Mom understands that infant's breastfeeding ability will improve as he gets closer to his EDD. Mom understands that she can have pre- & post-weights done at outpatient appts once he gets better at nursing.   Signs/sound of swallowing also discussed.   Mom has no other questions at this time.   [name], [name] 08/20/2017, 3:13 PM

## 2017-08-21 ENCOUNTER — Encounter (HOSPITAL_COMMUNITY): Payer: Self-pay | Admitting: *Deleted

## 2017-08-21 DIAGNOSIS — F32A Depression, unspecified: Secondary | ICD-10-CM | POA: Diagnosis present

## 2017-08-21 DIAGNOSIS — F419 Anxiety disorder, unspecified: Secondary | ICD-10-CM | POA: Diagnosis present

## 2017-08-21 DIAGNOSIS — F329 Major depressive disorder, single episode, unspecified: Secondary | ICD-10-CM | POA: Diagnosis present

## 2017-08-21 MED ORDER — OXYCODONE-ACETAMINOPHEN 5-325 MG PO TABS: 1.0000 | ORAL_TABLET | ORAL | 0 refills | Status: DC | PRN

## 2017-08-21 MED ORDER — MAGNESIUM OXIDE 400 (241.3 MG) MG PO TABS: 400.0000 mg | ORAL_TABLET | Freq: Every day | ORAL | Status: DC

## 2017-08-21 MED ORDER — SIMETHICONE 80 MG PO CHEW: 80.0000 mg | CHEWABLE_TABLET | Freq: Three times a day (TID) | ORAL | 0 refills | Status: DC

## 2017-08-21 MED ORDER — HYDROCORTISONE 1 % EX CREA: TOPICAL_CREAM | Freq: Three times a day (TID) | CUTANEOUS | 0 refills | Status: DC

## 2017-08-21 MED ORDER — ACETAMINOPHEN 325 MG PO TABS: 650.0000 mg | ORAL_TABLET | ORAL | Status: DC | PRN

## 2017-08-21 MED ORDER — SENNOSIDES-DOCUSATE SODIUM 8.6-50 MG PO TABS: 2.0000 | ORAL_TABLET | ORAL | Status: DC

## 2017-08-21 MED ORDER — COCONUT OIL OIL: 1.0000 "application " | TOPICAL_OIL | 0 refills | Status: DC | PRN

## 2017-08-21 MED ORDER — POLYSACCHARIDE IRON COMPLEX 150 MG PO CAPS: 150.0000 mg | ORAL_CAPSULE | Freq: Every day | ORAL | Status: DC

## 2017-08-21 NOTE — Progress Notes (Signed)
Subjective: POD# 3 Information for the patient's newborn:  Morene CrockerMoore, Boy Cloa [[id number]][[id number]]  female    circ completed Baby name: Clayburn Pertvan  Reports feeling well Feeding: breast and bottle Patient reports tolerating PO.  Breast symptoms: some colostrum Pain controlled withacetaminophen Denies HA/SOB/C/P/N/V/dizziness. Flatus present, + BM x 2. She reports vaginal bleeding as normal, without clots.  She is ambulating, urinating without difficulty.     Objective:   VS:    Vitals:   08/19/17 1947 08/20/17 0656 08/20/17 1735 08/21/17 0500  BP: 107/64 117/71 117/81 105/71  Pulse: 68 76 73 62  Resp: 18 18 18 18   Temp: 98 F (36.7 C) 97.8 F (36.6 C) 98 F (36.7 C) 97.8 F (36.6 C)  TempSrc: Oral Oral Oral Axillary  SpO2:    98%  Weight:      Height:        No intake or output data in the 24 hours ending 08/21/17 0842      Recent Labs    08/19/17 0533  WBC 17.5*  HGB 10.2*  HCT 29.4*  PLT 222     Blood type: --/--/AB POS, AB POS Performed at [facility], [address] [place], [place] [address]  [phone](02/18 [id number])  Rubella:   immune    Physical Exam:  General: alert, cooperative and no distress Abdomen: soft, nontender, normal bowel sounds Incision: clean, dry and intact Uterine Fundus: firm, below umbilicus, nontender Lochia: minimal Ext: pedal edema, no redness or tenderness in the calves or thighs      Assessment/Plan: 42 y.o.   POD# 3. G1P1                  Principal Problem:   Postpartum care following cesarean delivery (2/18) Active Problems:   Breech presentation delivered   Cesarean delivery delivered   Status post bariatric surgery   Anxiety   Depression   Doing well, stable.    Continue Celexa as prescribed, PPD s/sx discussed, close f/u at [facility] in 2 weeks for depression           Advance diet as tolerated Encourage rest when baby rests Breastfeeding support Encourage to ambulate Routine post-op care             DC home today w/  instructions  [facility] in 2 and 6 weeks and PRN   Neta Mendsaniela C Sofiya Ezelle, CNM, MSN 08/21/2017, 8:42 AM

## 2017-08-21 NOTE — Lactation Note (Signed)
This note was copied from a baby's chart. Lactation Consultation Note; Dad bottle feeding formula as I went into room. Mom reports she pumped 4 times yesterday and is obtaining 7-15 ml per pumping. Pleased to be seeing more. Has Spectra pump for home.  Asking about foods to eat or avoid- reviewed with mom. Reviewed our phone number, OP appointments and BFSG as resources for support after DC. Encouraged Op appointtment as he gets bigger. Encouraged to pump 8 times/day to promote milk supply. No questions at present. To call prn   Patient Name: Kimberly Floydene FlockJessica Beachem ZOXWR'UToday's Date: 08/21/2017 Reason for consult: Follow-up assessment;Late-preterm 34-36.6wks;Other (Comment)(Gastric bypass surgery, IVF)   Maternal Data Formula Feeding for Exclusion: No Has patient been taught Hand Expression?: Yes Does the patient have breastfeeding experience prior to this delivery?: No  Feeding Feeding Type: Formula  LATCH Score                   Interventions    Lactation Tools Discussed/Used WIC Program: No   Consult Status Consult Status: Complete    Pamelia HoitWeeks, Conrado Nance D 08/21/2017, 9:29 AM

## 2017-08-21 NOTE — Discharge Summary (Signed)
OB Discharge Summary     Patient Name: Kimberly Santiago DOB: February 20, 1976 MRN: [id number]  Date of admission: 08/18/2017 Delivering MD: MODY, VAISHALI   Date of discharge: 08/21/2017  Admitting diagnosis: 36 WEEKS ROM Intrauterine pregnancy: 4659w5d     Secondary diagnosis:  Principal Problem:   Postpartum care following cesarean delivery (2/18) Active Problems:   Breech presentation delivered   Cesarean delivery delivered   Status post bariatric surgery   Anxiety   Depression      Discharge diagnosis: Preterm Pregnancy Delivered and Anemia                                                                                                Complications: None  Hospital course:  Onset of Labor With Unplanned C/S  [age] y.o. yo G1P0 at 6659w5d was admitted in Latent Labor on 08/18/2017. Patient had a labor course significant for preterm rupture of membranes and breech presentation. Membrane Rupture Time/Date: 3:30 AM ,08/18/2017   The patient went for cesarean section due to Malpresentation, and delivered a Viable infant,08/18/2017  Details of operation can be found in separate operative note. Patient had an uncomplicated postpartum course.  She is ambulating,tolerating a regular diet, passing flatus, and urinating well.  Patient is discharged home in stable condition 08/21/17.  Physical exam  Vitals:   08/19/17 1947 08/20/17 0656 08/20/17 1735 08/21/17 0500  BP: 107/64 117/71 117/81 105/71  Pulse: 68 76 73 62  Resp: 18 18 18 18   Temp: 98 F (36.7 C) 97.8 F (36.6 C) 98 F (36.7 C) 97.8 F (36.6 C)  TempSrc: Oral Oral Oral Axillary  SpO2:    98%  Weight:      Height:       General: alert, cooperative and no distress Lochia: appropriate Uterine Fundus: firm Incision: Healing well with no significant drainage DVT Evaluation: No cords or calf tenderness. No significant calf/ankle edema. Labs: Lab Results  Component Value Date   WBC 17.5 (H) 08/19/2017   HGB 10.2 (L) 08/19/2017   HCT 29.4 (L) 08/19/2017   MCV 86.2 08/19/2017   PLT 222 08/19/2017   CMP Latest Ref Rng & Units 09/23/2016  Glucose 65 - 99 mg/dL 89  BUN 6 - 20 mg/dL 15  Creatinine 7.820.44 - 9.561.00 mg/dL 2.130.76  Sodium 086135 - 578145 mmol/L 138  Potassium 3.5 - 5.1 mmol/L 4.2  Chloride 101 - 111 mmol/L 105  CO2 22 - 32 mmol/L 26  Calcium 8.9 - 10.3 mg/dL 8.9  Total Protein 6.5 - 8.1 g/dL 7.4  Total Bilirubin 0.3 - 1.2 mg/dL 0.6  Alkaline Phos 38 - 126 U/L 54  AST 15 - 41 U/L 23  ALT 14 - 54 U/L 29    Discharge instruction: per After Visit Summary and "Baby and Me Booklet".  After visit meds:  Allergies as of 08/21/2017      Reactions   Codeine Other (See Comments)   Hydrocodone Other (See Comments)   Nsaids Other (See Comments)   Pt cannot take due to Gastric Bypass Surgery   Amoxicillin Itching   Pt has had  cephalexin in the past      Medication List    TAKE these medications   acetaminophen 325 MG tablet Commonly known as:  TYLENOL Take 2 tablets (650 mg total) by mouth every 4 (four) hours as needed (for pain scale < 4).   citalopram 40 MG tablet Commonly known as:  CELEXA Take 40 mg by mouth every morning.   coconut oil Oil Apply 1 application topically as needed.   docusate sodium 100 MG capsule Commonly known as:  COLACE Take 100 mg by mouth daily.   hydrocortisone cream 1 % Apply topically 3 (three) times daily.   iron polysaccharides 150 MG capsule Commonly known as:  NIFEREX Take 1 capsule (150 mg total) by mouth daily.   loratadine 10 MG tablet Commonly known as:  CLARITIN Take 10 mg by mouth at bedtime.   magnesium oxide 400 (241.3 Mg) MG tablet Commonly known as:  MAG-OX Take 1 tablet (400 mg total) by mouth daily.   oxyCODONE-acetaminophen 5-325 MG tablet Commonly known as:  PERCOCET/ROXICET Take 1 tablet by mouth every 4 (four) hours as needed (pain scale 4-7).   PRENATAL VITAMINS PO Take 1 tablet by mouth daily.   senna-docusate 8.6-50 MG tablet Commonly  known as:  Senokot-S Take 2 tablets by mouth daily. Start taking on:  08/22/2017   simethicone 80 MG chewable tablet Commonly known as:  MYLICON Chew 1 tablet (80 mg total) by mouth 3 (three) times daily after meals.       Diet: routine diet  Activity: Advance as tolerated. Pelvic rest for 6 weeks.   Outpatient follow up:2 weeks  Postpartum contraception: Not Discussed  Newborn Data: Live born female Clayburn Pert Birth Weight: 6 lb 9.1 oz (2980 g) APGAR: 7, 9  Newborn Delivery   Birth date/time:  08/18/2017 07:20:00 Delivery type:  C-Section, Low Transverse C-section categorization:  Primary     Baby Feeding: Bottle and Breast Disposition:home with mother   08/21/2017 Neta Mends, CNM

## 2017-08-21 NOTE — Progress Notes (Signed)
MOB was referred for history of depression/anxiety. * Referral screened out by Clinical Social Worker because none of the following criteria appear to apply: ~ History of anxiety/depression during this pregnancy, or of post-partum depression. ~ Diagnosis of anxiety and/or depression within last 3 years OR * MOB's symptoms currently being treated with medication and/or therapy. Please contact the Clinical Social Worker if needs arise or by MOB request.   CSW notes that MOB scored a 7 on the Edinburgh Postpartum Depression Screen, which is below the threshold for an automatic CSW consult.  CSW spoke with bedside RN who states MOB appears to be doing well this afternoon from her perspective.  MOB has Rx for Celexa. 

## 2017-08-27 ENCOUNTER — Encounter (HOSPITAL_COMMUNITY): Payer: Self-pay | Admitting: *Deleted

## 2017-09-02 NOTE — Op Note (Addendum)
Cesarean Section Procedure Note   Kimberly HawthorneJessica B Santiago  08/18/2017  Procedure: Primary Low transverse C-section                  (two layer closure)  Indications: Breech Presentation, SROM, preterm labor   Pre-operative Diagnosis: Breech Presentation, preterm labor, SROM  Post-operative Diagnosis: Same   Surgeon: Shea EvansVaishali Kada Friesen, MD  Assistants: Arlan Organaniela Paul, CNM  Anesthesia: spinal   Procedure Details:  The patient was seen in the Holding Room. The risks, benefits, complications, treatment options, and expected outcomes were discussed with the patient. The patient concurred with the proposed plan, giving informed consent. identified as Kimberly HawthorneJessica B Santiago and the procedure verified as C-Section Delivery. A Time Out was held and the above information confirmed. 2 gm Ancef given. SCDs on.  After induction of anesthesia, the patient was draped and prepped in the usual sterile manner, foley placed. A Pfannenstiel Incision was made and carried down through the subcutaneous tissue to the fascia. Fascial incision was made and extended transversely. The fascia was separated from the underlying rectus tissue superiorly and inferiorly. The peritoneum was identified and entered. Peritoneal incision was extended longitudinally. Alexis retractor placed. The utero-vesical peritoneal reflection was incised transversely and the bladder flap was bluntly freed from the lower uterine segment. A low transverse uterine incision was made. Fluid clear at amniotomy. Frank breech presentation. Delivered from breech presentation by complete breech extraction smoothly a FEMALE infant at 7.20 am on 08/18/17 with Apgar scores of 7 at one minute and 9 at five minutes. Delayed cord clamping done at 1 minute and baby handed to NICU team in attendance. Cord ph was not sent, cord blood was obtained for evaluation. The placenta was removed Intact and appeared normal. The uterine outline, tubes and ovaries appeared normal}. The uterine  incision was closed with running locked sutures of 0Monocryl followed by a second imbricating layer. Hemostasis was observed. Alexis retractor removed. Peritoneal closure with 2-0 Vicryl.  The fascia was then reapproximated with running sutures of 0Vicryl. The subcuticular closure was performed using 2-0plain gut. The skin was closed with 4-0Vicryl.  Sterile dressings placed.   Instrument, sponge, and needle counts were correct prior the abdominal closure and were correct at the conclusion of the case.    Findings: female infant delivered from low transverse hysterotomy at 7.20 am from frank breech presentation. Apgars 7, 9. Normal uterus, tubes, ovaries.    Estimated Blood Loss: 790 mL   Total IV Fluids:  See Anesthesia note   Urine Output: 100 ccCC OF clear urine  Specimens: cord blood   Complications: no complications  Disposition: PACU - hemodynamically stable.   Maternal Condition: stable   Baby condition / location:  Couplet care / Skin to Skin  Attending Attestation: I performed the procedure.   Signed: V.Diania Co, MD

## 2017-09-11 ENCOUNTER — Inpatient Hospital Stay (HOSPITAL_COMMUNITY): Admission: AD | Admit: 2017-09-11 | Payer: BLUE CROSS/BLUE SHIELD | Source: Ambulatory Visit | Admitting: Obstetrics

## 2017-09-29 DIAGNOSIS — Z1151 Encounter for screening for human papillomavirus (HPV): Secondary | ICD-10-CM | POA: Diagnosis not present

## 2017-10-09 DIAGNOSIS — H524 Presbyopia: Secondary | ICD-10-CM | POA: Diagnosis not present

## 2018-01-12 DIAGNOSIS — J302 Other seasonal allergic rhinitis: Secondary | ICD-10-CM | POA: Insufficient documentation

## 2018-01-12 DIAGNOSIS — Z6839 Body mass index (BMI) 39.0-39.9, adult: Secondary | ICD-10-CM | POA: Diagnosis not present

## 2018-01-12 DIAGNOSIS — R05 Cough: Secondary | ICD-10-CM | POA: Diagnosis not present

## 2018-01-12 DIAGNOSIS — J40 Bronchitis, not specified as acute or chronic: Secondary | ICD-10-CM | POA: Diagnosis not present

## 2018-01-23 DIAGNOSIS — M5431 Sciatica, right side: Secondary | ICD-10-CM | POA: Diagnosis not present

## 2018-01-23 DIAGNOSIS — Z9884 Bariatric surgery status: Secondary | ICD-10-CM | POA: Diagnosis not present

## 2018-01-23 DIAGNOSIS — Z6839 Body mass index (BMI) 39.0-39.9, adult: Secondary | ICD-10-CM | POA: Diagnosis not present

## 2018-02-26 DIAGNOSIS — J019 Acute sinusitis, unspecified: Secondary | ICD-10-CM | POA: Diagnosis not present

## 2018-03-05 DIAGNOSIS — H6123 Impacted cerumen, bilateral: Secondary | ICD-10-CM | POA: Diagnosis not present

## 2018-03-05 DIAGNOSIS — J019 Acute sinusitis, unspecified: Secondary | ICD-10-CM | POA: Diagnosis not present

## 2018-03-05 DIAGNOSIS — J0100 Acute maxillary sinusitis, unspecified: Secondary | ICD-10-CM | POA: Diagnosis not present

## 2018-03-09 DIAGNOSIS — G8929 Other chronic pain: Secondary | ICD-10-CM | POA: Diagnosis not present

## 2018-03-09 DIAGNOSIS — M25571 Pain in right ankle and joints of right foot: Secondary | ICD-10-CM | POA: Diagnosis not present

## 2018-03-09 DIAGNOSIS — M7661 Achilles tendinitis, right leg: Secondary | ICD-10-CM | POA: Diagnosis not present

## 2018-03-09 DIAGNOSIS — J328 Other chronic sinusitis: Secondary | ICD-10-CM | POA: Diagnosis not present

## 2018-03-17 DIAGNOSIS — Z6841 Body Mass Index (BMI) 40.0 and over, adult: Secondary | ICD-10-CM | POA: Insufficient documentation

## 2018-03-17 DIAGNOSIS — Z0000 Encounter for general adult medical examination without abnormal findings: Secondary | ICD-10-CM | POA: Diagnosis not present

## 2018-03-17 DIAGNOSIS — F3342 Major depressive disorder, recurrent, in full remission: Secondary | ICD-10-CM | POA: Diagnosis not present

## 2018-03-17 DIAGNOSIS — R5383 Other fatigue: Secondary | ICD-10-CM | POA: Diagnosis not present

## 2018-03-20 DIAGNOSIS — J342 Deviated nasal septum: Secondary | ICD-10-CM | POA: Diagnosis not present

## 2018-03-20 DIAGNOSIS — J328 Other chronic sinusitis: Secondary | ICD-10-CM | POA: Diagnosis not present

## 2018-03-31 ENCOUNTER — Encounter: Payer: Self-pay | Admitting: Emergency Medicine

## 2018-03-31 ENCOUNTER — Other Ambulatory Visit: Payer: Self-pay

## 2018-03-31 ENCOUNTER — Ambulatory Visit
Admission: EM | Admit: 2018-03-31 | Discharge: 2018-03-31 | Disposition: A | Discharge status: Home / Self Care | Payer: BLUE CROSS/BLUE SHIELD | Attending: Family Medicine | Admitting: Family Medicine

## 2018-03-31 DIAGNOSIS — R112 Nausea with vomiting, unspecified: Secondary | ICD-10-CM | POA: Diagnosis not present

## 2018-03-31 DIAGNOSIS — R1032 Left lower quadrant pain: Secondary | ICD-10-CM

## 2018-03-31 DIAGNOSIS — R197 Diarrhea, unspecified: Secondary | ICD-10-CM | POA: Diagnosis not present

## 2018-03-31 LAB — GASTROINTESTINAL PANEL BY PCR, STOOL (REPLACES STOOL CULTURE)

## 2018-03-31 LAB — C DIFFICILE QUICK SCREEN W PCR REFLEX
C Diff antigen: NEGATIVE
C Diff interpretation: NOT DETECTED
C Diff toxin: NEGATIVE

## 2018-03-31 MED ORDER — DIPHENOXYLATE-ATROPINE 2.5-0.025 MG PO TABS: 1.0000 | ORAL_TABLET | Freq: Four times a day (QID) | ORAL | 0 refills | Status: DC | PRN

## 2018-03-31 NOTE — ED Provider Notes (Signed)
[facility]    CSN: [id number] Arrival date & time: 03/31/18  1019  History   Chief Complaint Chief Complaint  Patient presents with  . Abdominal Pain    left sided  . Nausea   HPI  42 year old female presents with nausea, vomiting, diarrhea, and associated abdominal pain.  Patient states that she has been on multiple antibody courses due to ongoing sinus issues.  She has upcoming sinus surgery in November.  Patient states that on Friday she developed a crampy left lower quadrant pain.  This was followed by diarrhea.  Diarrhea has improved but persist.  Foul-smelling.  Associated back pain, nausea.  Has had one episode of emesis.  No fever.  She does note chills.  She is able to eat and drink.  Her pain is mild currently. No other associated symptoms. No other complaints.   PMH, Surgical Hx, Family Hx, Social History reviewed and updated as below.  Past Medical History:  Diagnosis Date  . Asthma   . Barrett esophagus   . Complication of anesthesia    Pt stopped breathing during EGD but did well with recent Gastric Bypass Surgery  . Depression   . GERD (gastroesophageal reflux disease)   . Headache   . History of hiatal hernia   . PONV (postoperative nausea and vomiting)   . Sleep apnea     Patient Active Problem List   Diagnosis Date Noted  . Anxiety 08/21/2017  . Depression 08/21/2017  . Breech presentation delivered 08/18/2017  . Postpartum care following cesarean delivery (2/18) 08/18/2017  . Cesarean delivery delivered 08/18/2017  . Status post bariatric surgery 09/27/2014    Past Surgical History:  Procedure Laterality Date  . CESAREAN SECTION N/A 08/18/2017   Procedure: Primary CESAREAN SECTION;  Surgeon: Noland Fordyce, MD;  Location: [facility] BIRTHING SUITES;  Service: Obstetrics;  Laterality: N/A;  EDD: 09/13/17 Allergy: Amoxicillin, Morphine, Hydrocodone  . CHOLECYSTECTOMY    . DIAGNOSTIC LAPAROSCOPY    . EAR CYST EXCISION N/A 11/24/2014   Procedure: CYST REMOVAL;  Surgeon: Bud Face, MD;  Location: [facility] ORS;  Service: ENT;  Laterality: N/A;  upper lip  . ESOPHAGOGASTRODUODENOSCOPY    . GASTRIC BYPASS  March 2016  . KNEE ARTHROSCOPY Right   . TEMPOROMANDIBULAR JOINT SURGERY    . WISDOM TOOTH EXTRACTION      OB History    Gravida  1   Para  1   Term      Preterm  1   AB      Living  1     SAB      TAB      Ectopic      Multiple      Live Births  1            Home Medications    Prior to Admission medications   Medication Sig Start Date End Date Taking? Authorizing Provider  citalopram (CELEXA) 40 MG tablet Take 40 mg by mouth every morning.   Yes [provider]  loratadine (CLARITIN) 10 MG tablet Take 10 mg by mouth at bedtime.   Yes [provider]  Prenatal Multivit-Min-Fe-FA (PRENATAL VITAMINS PO) Take 1 tablet by mouth daily.   Yes [provider]  acetaminophen (TYLENOL) 325 MG tablet Take 2 tablets (650 mg total) by mouth every 4 (four) hours as needed (for pain scale < 4). 08/21/17   Neta Mends, CNM  coconut oil OIL Apply 1 application topically as needed. 08/21/17  Neta Mends, CNM  diphenoxylate-atropine (LOMOTIL) 2.5-0.025 MG tablet Take 1 tablet by mouth 4 (four) times daily as needed for diarrhea or loose stools. 03/31/18   Tommie Sams, DO  docusate sodium (COLACE) 100 MG capsule Take 100 mg by mouth daily.    [provider]  hydrocortisone cream 1 % Apply topically 3 (three) times daily. 08/21/17   Neta Mends, CNM  iron polysaccharides (NIFEREX) 150 MG capsule Take 1 capsule (150 mg total) by mouth daily. 08/21/17   Neta Mends, CNM    Family History History reviewed. No pertinent family history.  Social History Social History   Tobacco Use  . Smoking status: Never Smoker  . Smokeless tobacco: Never Used  Substance Use Topics  . Alcohol use: No  . Drug use: No     Allergies   Codeine; Hydrocodone; Nsaids; and  Amoxicillin   Review of Systems Review of Systems  Constitutional: Positive for chills. Negative for fever.  Gastrointestinal: Positive for abdominal pain, diarrhea, nausea and vomiting.   Physical Exam Triage Vital Signs ED Triage Vitals  Enc Vitals Group     BP 03/31/18 1031 100/71     Pulse Rate 03/31/18 1031 67     Resp 03/31/18 1031 16     Temp 03/31/18 1031 98.4 F (36.9 C)     Temp Source 03/31/18 1031 Oral     SpO2 03/31/18 1031 100 %     Weight 03/31/18 1028 260 lb (117.9 kg)     Height 03/31/18 1028 5\' 8"  (1.727 m)     Head Circumference --      Peak Flow --      Pain Score 03/31/18 1028 3     Pain Loc --      Pain Edu? --      Excl. in GC? --    Updated Vital Signs BP 100/71 (BP Location: Left Arm)   Pulse 67   Temp 98.4 F (36.9 C) (Oral)   Resp 16   Ht 5\' 8"  (1.727 m)   Wt 117.9 kg   LMP 03/10/2018 (Approximate)   SpO2 100%   Breastfeeding? No   BMI 39.53 kg/m   Visual Acuity Right Eye Distance:   Left Eye Distance:   Bilateral Distance:    Right Eye Near:   Left Eye Near:    Bilateral Near:     Physical Exam  Constitutional: She is oriented to person, place, and time. She appears well-developed. No distress.  HENT:  Head: Normocephalic and atraumatic.  Mouth/Throat: Oropharynx is clear and moist.  Cardiovascular: Normal rate and regular rhythm.  Pulmonary/Chest: Effort normal and breath sounds normal. She has no wheezes. She has no rales.  Abdominal: Soft. She exhibits no distension. There is no tenderness.  Neurological: She is alert and oriented to person, place, and time.  Psychiatric: She has a normal mood and affect. Her behavior is normal.  Nursing note and vitals reviewed.    UC Treatments / Results  Labs (all labs ordered are listed, but only abnormal results are displayed) Labs Reviewed  C DIFFICILE QUICK SCREEN W PCR REFLEX  GASTROINTESTINAL PANEL BY PCR, STOOL (REPLACES STOOL CULTURE)    EKG None  Radiology No  results found.  Procedures Procedures (including critical care time)  Medications Ordered in UC Medications - No data to display  Initial Impression / Assessment and Plan / UC Course  I have reviewed the triage vital signs and the nursing notes.  Pertinent labs &  imaging results that were available during my care of the patient were reviewed by me and considered in my medical decision making (see chart for details).    42 year old female presents with nausea, vomiting, diarrhea and associated abdominal pain.  Her exam is unrevealing.  No indication for imaging.  Rapid C. difficile obtained and was negative. Awaiting GI panel. Lomotil as needed. Supportive care. Work note given.  Final Clinical Impressions(s) / UC Diagnoses   Final diagnoses:  Diarrhea of presumed infectious origin  LLQ pain     Discharge Instructions     Fluids.  Rest.  We will call with the results.  Take care  Dr. Adriana Simas    ED Prescriptions    Medication Sig Dispense Auth. Provider   diphenoxylate-atropine (LOMOTIL) 2.5-0.025 MG tablet Take 1 tablet by mouth 4 (four) times daily as needed for diarrhea or loose stools. 30 tablet Tommie Sams, DO     Controlled Substance Prescriptions [place] Controlled Substance Registry consulted? Not Applicable   Tommie Sams, DO 03/31/18 1238

## 2018-03-31 NOTE — ED Triage Notes (Signed)
Patient states that she had diarrhea that started on Saturday and has improved. Patient reports ongoing pain on the left side of abdomen and nausea since Saturday.

## 2018-03-31 NOTE — Discharge Instructions (Signed)
Fluids.  Rest.  We will call with the results.  Take care  Dr. Adriana Simas

## 2018-04-09 DIAGNOSIS — J328 Other chronic sinusitis: Secondary | ICD-10-CM | POA: Diagnosis not present

## 2018-04-29 ENCOUNTER — Encounter
Admission: RE | Admit: 2018-04-29 | Discharge: 2018-04-29 | Disposition: A | Discharge status: Home / Self Care | Payer: BLUE CROSS/BLUE SHIELD | Source: Ambulatory Visit | Attending: Otolaryngology | Admitting: Otolaryngology

## 2018-04-29 ENCOUNTER — Other Ambulatory Visit: Payer: Self-pay

## 2018-04-29 HISTORY — DX: Anemia, unspecified: D64.9

## 2018-04-29 NOTE — Patient Instructions (Signed)
Your procedure is scheduled on: 05-07-18   Report to Same Day Surgery 2nd floor medical mall [facility] Entrance-take elevator on left to 2nd floor.  Check in with surgery information desk.) To find out your arrival time please call [phone] between 1PM - 3PM on 05-06-18  Remember: Instructions that are not followed completely may result in serious medical risk, up to and including death, or upon the discretion of your surgeon and anesthesiologist your surgery may need to be rescheduled.    _x___ 1. Do not eat food after midnight the night before your procedure. You may drink clear liquids up to 2 hours before you are scheduled to arrive at the hospital for your procedure.  Do not drink clear liquids within 2 hours of your scheduled arrival to the hospital.  Clear liquids include  --Water or Apple juice without pulp  --Clear carbohydrate beverage such as ClearFast or Gatorade  --Black Coffee or Clear Tea (No milk, no creamers, do not add anything to the coffee or Tea   ____Ensure clear carbohydrate drink on the way to the hospital for bariatric patients  ____Ensure clear carbohydrate drink 3 hours before surgery for Dr Rutherford Nail patients if physician instructed.    __x__ 2. No Alcohol for 24 hours before or after surgery.   __x__3. No Smoking or e-cigarettes for 24 prior to surgery.  Do not use any chewable tobacco products for at least 6 hour prior to surgery   ____  4. Bring all medications with you on the day of surgery if instructed.    __x__ 5. Notify your doctor if there is any change in your medical condition     (cold, fever, infections).    x___6. On the morning of surgery brush your teeth with toothpaste and water.  You may rinse your mouth with mouth wash if you wish.  Do not swallow any toothpaste or mouthwash.   Do not wear jewelry, make-up, hairpins, clips or nail polish.  Do not wear lotions, powders, or perfumes. You may wear deodorant.  Do not shave 48 hours  prior to surgery. Men may shave face and neck.  Do not bring valuables to the hospital.    [facility] is not responsible for any belongings or valuables.               Contacts, dentures or bridgework may not be worn into surgery.  Leave your suitcase in the car. After surgery it may be brought to your room.  For patients admitted to the hospital, discharge time is determined by your treatment team.  _  Patients discharged the day of surgery will not be allowed to drive home.  You will need someone to drive you home and stay with you the night of your procedure.    Please read over the following fact sheets that you were given:   [facility] Preparing for Surgery   ____ Take anti-hypertensive listed below, cardiac, seizure, asthma, anti-reflux and psychiatric medicines. These include:  1. NONE  2.  3.  4.  5.  6.  ____Fleets enema or Magnesium Citrate as directed.   ____ Use CHG Soap or sage wipes as directed on instruction sheet   ____ Use inhalers on the day of surgery and bring to hospital day of surgery  ____ Stop Metformin and Janumet 2 days prior to surgery.    ____ Take 1/2 of usual insulin dose the night before surgery and none on the morning surgery.   ____ Follow  recommendations from Cardiologist, Pulmonologist or PCP regarding stopping Aspirin, Coumadin, Plavix ,Eliquis, Effient, or Pradaxa, and Pletal.  X____Stop Anti-inflammatories such as Advil, Aleve, Ibuprofen, Motrin, Naproxen, Naprosyn, Goodies powders or aspirin products NOW-OK to take Tylenol    ____ Stop supplements until after surgery.     ____ Bring C-Pap to the hospital.

## 2018-05-01 ENCOUNTER — Other Ambulatory Visit: Payer: Self-pay | Admitting: Family Medicine

## 2018-05-01 DIAGNOSIS — Z1231 Encounter for screening mammogram for malignant neoplasm of breast: Secondary | ICD-10-CM

## 2018-05-07 ENCOUNTER — Encounter
Admission: RE | Disposition: A | Discharge status: Home / Self Care | Payer: Self-pay | Source: Ambulatory Visit | Attending: Otolaryngology

## 2018-05-07 ENCOUNTER — Ambulatory Visit: Payer: BLUE CROSS/BLUE SHIELD | Admitting: Registered Nurse

## 2018-05-07 ENCOUNTER — Other Ambulatory Visit: Payer: Self-pay

## 2018-05-07 ENCOUNTER — Encounter: Payer: Self-pay | Admitting: Emergency Medicine

## 2018-05-07 ENCOUNTER — Ambulatory Visit
Admission: RE | Admit: 2018-05-07 | Discharge: 2018-05-07 | Disposition: A | Discharge status: Home / Self Care | Payer: BLUE CROSS/BLUE SHIELD | Source: Ambulatory Visit | Attending: Otolaryngology | Admitting: Otolaryngology

## 2018-05-07 DIAGNOSIS — J342 Deviated nasal septum: Secondary | ICD-10-CM | POA: Insufficient documentation

## 2018-05-07 DIAGNOSIS — J320 Chronic maxillary sinusitis: Secondary | ICD-10-CM | POA: Diagnosis not present

## 2018-05-07 DIAGNOSIS — Z79899 Other long term (current) drug therapy: Secondary | ICD-10-CM | POA: Insufficient documentation

## 2018-05-07 DIAGNOSIS — J329 Chronic sinusitis, unspecified: Secondary | ICD-10-CM | POA: Diagnosis not present

## 2018-05-07 DIAGNOSIS — J322 Chronic ethmoidal sinusitis: Secondary | ICD-10-CM | POA: Diagnosis not present

## 2018-05-07 DIAGNOSIS — F329 Major depressive disorder, single episode, unspecified: Secondary | ICD-10-CM | POA: Diagnosis not present

## 2018-05-07 DIAGNOSIS — J343 Hypertrophy of nasal turbinates: Secondary | ICD-10-CM | POA: Diagnosis not present

## 2018-05-07 DIAGNOSIS — J321 Chronic frontal sinusitis: Secondary | ICD-10-CM | POA: Diagnosis not present

## 2018-05-07 DIAGNOSIS — J324 Chronic pansinusitis: Secondary | ICD-10-CM | POA: Diagnosis not present

## 2018-05-07 DIAGNOSIS — J323 Chronic sphenoidal sinusitis: Secondary | ICD-10-CM | POA: Diagnosis not present

## 2018-05-07 HISTORY — PX: NASAL SEPTOPLASTY W/ TURBINOPLASTY: SHX2070

## 2018-05-07 HISTORY — PX: MAXILLARY ANTROSTOMY: SHX2003

## 2018-05-07 HISTORY — PX: SPHENOIDECTOMY: SHX2421

## 2018-05-07 HISTORY — PX: FRONTAL SINUS EXPLORATION: SHX6591

## 2018-05-07 HISTORY — PX: ETHMOIDECTOMY: SHX5197

## 2018-05-07 HISTORY — PX: IMAGE GUIDED SINUS SURGERY: SHX6570

## 2018-05-07 LAB — POCT PREGNANCY, URINE: Preg Test, Ur: NEGATIVE

## 2018-05-07 SURGERY — SINUS SURGERY, WITH IMAGING GUIDANCE
Anesthesia: General | Laterality: Right

## 2018-05-07 MED ORDER — FAMOTIDINE 20 MG PO TABS
20.0000 mg | ORAL_TABLET | Freq: Once | ORAL | Status: AC
  Administered 2018-05-07: 20 mg via ORAL

## 2018-05-07 MED ORDER — FENTANYL CITRATE (PF) 100 MCG/2ML IJ SOLN
INTRAMUSCULAR | Status: AC
  Filled 2018-05-07: qty 2

## 2018-05-07 MED ORDER — FAMOTIDINE 20 MG PO TABS
ORAL_TABLET | ORAL | Status: AC
  Administered 2018-05-07: 20 mg via ORAL
  Filled 2018-05-07: qty 1

## 2018-05-07 MED ORDER — OXYCODONE-ACETAMINOPHEN 5-325 MG PO TABS
1.0000 | ORAL_TABLET | Freq: Once | ORAL | Status: AC
  Administered 2018-05-07: 1 via ORAL

## 2018-05-07 MED ORDER — ACETAMINOPHEN 10 MG/ML IV SOLN
INTRAVENOUS | Status: AC
  Filled 2018-05-07: qty 100

## 2018-05-07 MED ORDER — PROPOFOL 10 MG/ML IV BOLUS
INTRAVENOUS | Status: AC
  Filled 2018-05-07: qty 20

## 2018-05-07 MED ORDER — ONDANSETRON HCL 4 MG/2ML IJ SOLN
INTRAMUSCULAR | Status: AC
  Filled 2018-05-07: qty 2

## 2018-05-07 MED ORDER — LACTATED RINGERS IV SOLN
INTRAVENOUS | Status: DC
  Administered 2018-05-07 (×2): via INTRAVENOUS

## 2018-05-07 MED ORDER — SCOPOLAMINE 1 MG/3DAYS TD PT72
MEDICATED_PATCH | TRANSDERMAL | Status: AC
  Administered 2018-05-07: 1.5 mg via TRANSDERMAL
  Filled 2018-05-07: qty 1

## 2018-05-07 MED ORDER — LIDOCAINE-EPINEPHRINE 1 %-1:100000 IJ SOLN
INTRAMUSCULAR | Status: DC | PRN
  Administered 2018-05-07: 5 mL

## 2018-05-07 MED ORDER — LIDOCAINE-EPINEPHRINE 1 %-1:100000 IJ SOLN
INTRAMUSCULAR | Status: AC
  Filled 2018-05-07: qty 1

## 2018-05-07 MED ORDER — MIDAZOLAM HCL 2 MG/2ML IJ SOLN
INTRAMUSCULAR | Status: AC
  Filled 2018-05-07: qty 2

## 2018-05-07 MED ORDER — LIDOCAINE HCL (CARDIAC) PF 100 MG/5ML IV SOSY
PREFILLED_SYRINGE | INTRAVENOUS | Status: DC | PRN
  Administered 2018-05-07: 100 mg via INTRAVENOUS

## 2018-05-07 MED ORDER — HYDROMORPHONE HCL 1 MG/ML IJ SOLN
INTRAMUSCULAR | Status: AC
  Administered 2018-05-07: 0.5 mg via INTRAVENOUS
  Filled 2018-05-07: qty 1

## 2018-05-07 MED ORDER — ROCURONIUM BROMIDE 100 MG/10ML IV SOLN
INTRAVENOUS | Status: DC | PRN
  Administered 2018-05-07 (×2): 20 mg via INTRAVENOUS
  Administered 2018-05-07: 50 mg via INTRAVENOUS

## 2018-05-07 MED ORDER — MIDAZOLAM HCL 2 MG/2ML IJ SOLN
INTRAMUSCULAR | Status: DC | PRN
  Administered 2018-05-07: 2 mg via INTRAVENOUS

## 2018-05-07 MED ORDER — SCOPOLAMINE 1 MG/3DAYS TD PT72
1.0000 | MEDICATED_PATCH | Freq: Once | TRANSDERMAL | Status: DC
  Administered 2018-05-07: 1.5 mg via TRANSDERMAL

## 2018-05-07 MED ORDER — FENTANYL CITRATE (PF) 100 MCG/2ML IJ SOLN
INTRAMUSCULAR | Status: DC | PRN
  Administered 2018-05-07 (×2): 25 ug via INTRAVENOUS
  Administered 2018-05-07 (×3): 50 ug via INTRAVENOUS

## 2018-05-07 MED ORDER — PROPOFOL 10 MG/ML IV BOLUS
INTRAVENOUS | Status: DC | PRN
  Administered 2018-05-07: 200 mg via INTRAVENOUS

## 2018-05-07 MED ORDER — EPHEDRINE SULFATE 50 MG/ML IJ SOLN
INTRAMUSCULAR | Status: DC | PRN
  Administered 2018-05-07: 10 mg via INTRAVENOUS

## 2018-05-07 MED ORDER — OXYMETAZOLINE HCL 0.05 % NA SOLN
NASAL | Status: AC
  Filled 2018-05-07: qty 15

## 2018-05-07 MED ORDER — DEXAMETHASONE SODIUM PHOSPHATE 10 MG/ML IJ SOLN
INTRAMUSCULAR | Status: AC
  Filled 2018-05-07: qty 1

## 2018-05-07 MED ORDER — PROPOFOL 500 MG/50ML IV EMUL
INTRAVENOUS | Status: DC | PRN
  Administered 2018-05-07: 150 ug/kg/min via INTRAVENOUS

## 2018-05-07 MED ORDER — SUGAMMADEX SODIUM 200 MG/2ML IV SOLN
INTRAVENOUS | Status: AC
  Filled 2018-05-07: qty 4

## 2018-05-07 MED ORDER — FENTANYL CITRATE (PF) 100 MCG/2ML IJ SOLN
25.0000 ug | INTRAMUSCULAR | Status: DC | PRN
  Administered 2018-05-07: 50 ug via INTRAVENOUS
  Administered 2018-05-07 (×4): 25 ug via INTRAVENOUS

## 2018-05-07 MED ORDER — PROPOFOL 500 MG/50ML IV EMUL
INTRAVENOUS | Status: AC
  Filled 2018-05-07: qty 100

## 2018-05-07 MED ORDER — OXYCODONE-ACETAMINOPHEN 5-325 MG PO TABS
ORAL_TABLET | ORAL | Status: AC
  Administered 2018-05-07: 1 via ORAL
  Filled 2018-05-07: qty 1

## 2018-05-07 MED ORDER — PROPOFOL 500 MG/50ML IV EMUL
INTRAVENOUS | Status: AC
  Filled 2018-05-07: qty 50

## 2018-05-07 MED ORDER — BACITRACIN ZINC 500 UNIT/GM EX OINT
TOPICAL_OINTMENT | CUTANEOUS | Status: AC
  Filled 2018-05-07: qty 28.35

## 2018-05-07 MED ORDER — LIDOCAINE HCL (PF) 2 % IJ SOLN
INTRAMUSCULAR | Status: AC
  Filled 2018-05-07: qty 10

## 2018-05-07 MED ORDER — FENTANYL CITRATE (PF) 100 MCG/2ML IJ SOLN
INTRAMUSCULAR | Status: AC
  Administered 2018-05-07: 25 ug via INTRAVENOUS
  Filled 2018-05-07: qty 2

## 2018-05-07 MED ORDER — ONDANSETRON HCL 4 MG PO TABS: 4.0000 mg | ORAL_TABLET | Freq: Three times a day (TID) | ORAL | 0 refills | Status: DC | PRN

## 2018-05-07 MED ORDER — HYDROMORPHONE HCL 1 MG/ML IJ SOLN
0.5000 mg | INTRAMUSCULAR | Status: AC | PRN
  Administered 2018-05-07 (×4): 0.5 mg via INTRAVENOUS

## 2018-05-07 MED ORDER — ONDANSETRON HCL 4 MG/2ML IJ SOLN
INTRAMUSCULAR | Status: DC | PRN
  Administered 2018-05-07: 4 mg via INTRAVENOUS

## 2018-05-07 MED ORDER — OXYCODONE-ACETAMINOPHEN 5-325 MG PO TABS: 1.0000 | ORAL_TABLET | ORAL | 0 refills | Status: AC | PRN

## 2018-05-07 MED ORDER — MEPERIDINE HCL 50 MG/ML IJ SOLN: 6.2500 mg | INTRAMUSCULAR | Status: DC | PRN

## 2018-05-07 MED ORDER — ROCURONIUM BROMIDE 50 MG/5ML IV SOLN
INTRAVENOUS | Status: AC
  Filled 2018-05-07: qty 1

## 2018-05-07 MED ORDER — DEXAMETHASONE SODIUM PHOSPHATE 10 MG/ML IJ SOLN
INTRAMUSCULAR | Status: DC | PRN
  Administered 2018-05-07: 10 mg via INTRAVENOUS

## 2018-05-07 MED ORDER — CEFDINIR 300 MG PO CAPS: 300.0000 mg | ORAL_CAPSULE | Freq: Two times a day (BID) | ORAL | 0 refills | Status: DC

## 2018-05-07 MED ORDER — ACETAMINOPHEN 10 MG/ML IV SOLN
INTRAVENOUS | Status: DC | PRN
  Administered 2018-05-07: 1000 mg via INTRAVENOUS

## 2018-05-07 MED ORDER — PROMETHAZINE HCL 25 MG/ML IJ SOLN: 6.2500 mg | INTRAMUSCULAR | Status: DC | PRN

## 2018-05-07 SURGICAL SUPPLY — 47 items
BALLN SINUPLASTY KIT 6X16 (BALLOONS) ×4
BALLOON SINUPLASTY KIT 6X16 (BALLOONS) ×3 IMPLANT
BATTERY INSTRU NAVIGATION (MISCELLANEOUS) ×8 IMPLANT
BLADE SURG 15 STRL LF DISP TIS (BLADE) ×3 IMPLANT
BLADE SURG 15 STRL SS (BLADE) ×1
BNDG EYE OVAL (GAUZE/BANDAGES/DRESSINGS) IMPLANT
CANISTER SUC SOCK COL 7IN (MISCELLANEOUS) ×4 IMPLANT
CANISTER SUCT 1200ML W/VALVE (MISCELLANEOUS) ×4 IMPLANT
CANISTER SUCT 3000ML PPV (MISCELLANEOUS) ×4 IMPLANT
CNTNR SPEC 2.5X3XGRAD LEK (MISCELLANEOUS) ×6
COAG SUCT 10F 3.5MM HAND CTRL (MISCELLANEOUS) ×4 IMPLANT
CONT SPEC 4OZ STER OR WHT (MISCELLANEOUS) ×2
CONTAINER SPEC 2.5X3XGRAD LEK (MISCELLANEOUS) ×6 IMPLANT
COVER WAND RF STERILE (DRAPES) ×4 IMPLANT
DEVICE INFLATION SEID (MISCELLANEOUS) ×4 IMPLANT
DRESSING NASL FOAM PST OP SINU (MISCELLANEOUS) IMPLANT
DRSG NASAL FOAM POST OP SINU (MISCELLANEOUS)
ELECT REM PT RETURN 9FT ADLT (ELECTROSURGICAL) ×4
ELECTRODE REM PT RTRN 9FT ADLT (ELECTROSURGICAL) ×3 IMPLANT
GAUZE PACK 2X3YD (MISCELLANEOUS) ×4 IMPLANT
GLOVE BIO SURGEON STRL SZ7.5 (GLOVE) ×8 IMPLANT
GOWN STRL REUS W/ TWL LRG LVL3 (GOWN DISPOSABLE) ×6 IMPLANT
GOWN STRL REUS W/TWL LRG LVL3 (GOWN DISPOSABLE) ×2
IV NS 500ML (IV SOLUTION) ×1
IV NS 500ML BAXH (IV SOLUTION) ×3 IMPLANT
KIT TURNOVER KIT A (KITS) ×4 IMPLANT
LABEL OR SOLS (LABEL) ×4 IMPLANT
NS IRRIG 500ML POUR BTL (IV SOLUTION) ×4 IMPLANT
PACK HEAD/NECK (MISCELLANEOUS) ×4 IMPLANT
PACKING NASAL EPIS 4X2.4 XEROG (MISCELLANEOUS) ×4 IMPLANT
PATTIES SURGICAL .5 X3 (DISPOSABLE) ×8 IMPLANT
SHAVER DIEGO BLD STD TYPE A (BLADE) ×4 IMPLANT
SOL ANTI-FOG 6CC FOG-OUT (MISCELLANEOUS) ×3 IMPLANT
SOL FOG-OUT ANTI-FOG 6CC (MISCELLANEOUS) ×1
SPLINT NASAL REUTER .5MM (MISCELLANEOUS) IMPLANT
SPLINT NASAL REUTER .5MM BIVLV (MISCELLANEOUS) IMPLANT
SPLINT NASAL SEPTAL PRE-CUT (MISCELLANEOUS) IMPLANT
SUT CHROMIC 4 0 RB 1X27 (SUTURE) ×4 IMPLANT
SUT ETH BLK MONO 3 0 FS 1 12/B (SUTURE) ×4 IMPLANT
SUT ETHILON 3 0 PS 1 (SUTURE) ×4 IMPLANT
SWAB CULTURE AMIES ANAERIB BLU (MISCELLANEOUS) IMPLANT
SYR 30ML LL (SYRINGE) ×4 IMPLANT
SYR 3ML LL SCALE MARK (SYRINGE) ×4 IMPLANT
TRACKER CRANIALMASK (MASK) ×4 IMPLANT
TUBING CONNECTING 10 (TUBING) ×4 IMPLANT
TUBING DECLOG MULTIDEBRIDER (TUBING) ×4 IMPLANT
WATER STERILE IRR 1000ML POUR (IV SOLUTION) ×4 IMPLANT

## 2018-05-07 NOTE — Anesthesia Post-op Follow-up Note (Signed)
Anesthesia QCDR form completed.        

## 2018-05-07 NOTE — Anesthesia Procedure Notes (Signed)
Procedure Name: Intubation Date/Time: 05/07/2018 10:00 AM Performed by: Doreen Salvage, CRNA Pre-anesthesia Checklist: Patient identified, Patient being monitored, Timeout performed, Emergency Drugs available and Suction available Patient Re-evaluated:Patient Re-evaluated prior to induction Oxygen Delivery Method: Circle system utilized Preoxygenation: Pre-oxygenation with 100% oxygen Induction Type: IV induction Ventilation: Mask ventilation without difficulty Laryngoscope Size: Mac, 3 and McGraph Grade View: Grade I Tube type: Oral Tube size: 7.0 mm Number of attempts: 1 Airway Equipment and Method: Stylet Placement Confirmation: ETT inserted through vocal cords under direct vision,  positive ETCO2 and breath sounds checked- equal and bilateral Secured at: 22 cm Tube secured with: Tape Dental Injury: Teeth and Oropharynx as per pre-operative assessment  Difficulty Due To: Difficult Airway- due to limited oral opening

## 2018-05-07 NOTE — H&P (Signed)
..  History and Physical paper copy reviewed and updated date of procedure and will be scanned into system.  Patient seen and examined.  

## 2018-05-07 NOTE — Op Note (Signed)
..05/07/2018  12:04 PM    [name]  [id number]    Pre-Op Dx:  Deviated Nasal Septum, Hypertrophic Inferior Turbinates, Chronic rhinosinusitis  Post-op Dx: Same  Proc: 1)  Bilateral Maxillary Antrostomy  2)  Right anterior ethmoidectomy  3)  Right Frontal Sinusotomy  4)  Right Sphenoidotomy  5)  Nasal Septoplasty  6)  Bilateral Partial Reduction Inferior Turbinates   7)  Image Guidance Sinus Surgery  Surg:  Faron Whitelock  Anes:  GOT  EBL:   Comp:  none  Findings: Left sided septal spur with impaction onto inferior turbinate reduced, bilateral significant inferior turbinate hypertrophy, bilateral obstructed OMC with purulence in right maxillary, right ethmoid, and right frontal sinus.    Procedure: With the patient in a comfortable supine position,  general orotracheal anesthesia was induced without difficulty.  The patient received preoperative Afrin spray for topical decongestion and vasoconstriction.  At an appropriate level, the patient was placed in a semi-sitting position.  Nasal vibrissae were trimmed.   1% Xylocaine with 1:100,000 epinephrine, 5 cc's, was infiltrated into the anterior floor of the nose, into the nasal spine region, into the membranous columella, and finally into the submucoperichondrial plane of the septum on both sides.  Several minutes were allowed for this to take effect.  Cottoniod pledgetts soaked in Afrin were placed into both nasal cavities and left while the patient was prepped and draped in the standard fashion.   A proper time-out was performed.  The Stryker image guidance system was set up and calibrated in the normal fashion with an acceptable error of 0.60mm.   The materials were removed from the nose and observed to be intact and correct in number.  The nose was inspected with a headlight and zero degree endoscope with the findings as described above.  A mucoperichondrial flap was elelvated along the quadrangular plate back  to the bony-cartilaginous junction using caudal elevator just anterior to the left sided septal spur.  Once the mucosa was elevated off, using the [facility] elevator the bone and cartilage spur was seperated from the septum and removed.  This resulted in resolution of the left sided septal spur.  The inferior turbinates were then inspected.  Under endoscopic visualization, the inferior turbinates were infractured bilaterally with a Therapist, nutritional.  A kelly clamp was attached to the anterior-inferior third of each inferior turbinate for approximately one minute.  Under endoscopic visualization, Tru-cutting forceps were used to remove the anterior-inferior third of each inferior turbinate.  Electrocautery was used to control bleeding in the area. The remaining turbinate was then outfractured to open up the airway further. There was no significant bleeding noted. The right turbinate was then trimmed and outfractured in a similar fashion.  The airways were then visualized and showed open passageways on both sides that were significantly improved compared to before surgery.       At this point, attention was directed to the functional endoscopic sinus surgery aspect of the procedure.  The nose was next inspected with a zero degree endoscope and the middle turbinates were medialized and afrin soaked pledgets were placed lateral to the turbinates for approximately one minute.  The uncinate process was infractured with a maxillary ostia seeker.  On the right side, the uncinate was already pushed anteriorly and medially and was osteitic in nature.  The Acclarent balloon sinuplasty device was next placed behind the uncinate process and the guide wire was threaded into the frontal sinus with proper transillumination.  Purulence was seen  The sinus tract was then dilated 3X for a patent frontal sinus.  At this time a currette and Giraffe probe was used to remove the fractured ager nasi cells for a widely patient frontal  sinus outflow tract.  Image guidance suction was used to ensure widely patent frontal sinus outflow tract.  Irrigation was used to ensure all purulent drainage removed as well.  This was evaluated with 0 degree endoscope and fragements of residual ager nasi cells were removed with 45 degree up-biting forceps.  At this time attention was directed to the patient's maxillary sinuses.  On the left, a ball tipped probe was placed through the natural ostia and this was used to create a larger opening.  Using a Diego microdebrider, the maxillary antrostomy was enlarged for a widely patent maxillary antrostomy.  This was repeated in a simlar fashion on the patient's right side.  Hemostasis was performed with topical Afrin soaked pledgets.  Visualization with a zero degree endoscope was used to examine the bilateral maxillary antrostomies which were noted to be widely patent and in continuity with the natural os bilaterally.  Next attention was directed to the patient's ethmoid sinuses.  The right ethmoid bulla was entered with a straight image guidance suction.  The ethmoid cells were opened from a medial and inferior position superior and laterally until the vertical lamella was encountered.  Pieces of the ethmoid air cells and mucosa were removed with ethmoid forceps.  Attention at this time was directed to the patient's sphenoid sinuses.  On the patient's right side, the middle turbinate was lateralized and the superior turbinate was identified.  The sphenoid os was visualized and the op was sequentially dilated with an acclarent balloon device and then elarged with 45 degree biting forceps for a widely patent right sphenoid sinus.  At this time with all diseased sinuses opened, the patient's nasal cavity was examinated and copiously irrigated with sterile saline.  Meticulous hemostasis was continued and all sinuses were examined and noted to be widely patent.    There was no signifcant bleeding.  Stamberger  sinufoam was placed along the cut edge of the inferior turbinates bilaterally.  Xerogel was placed lateral to the middle turbinates bilaterally as well.  The patient was turned back over to anesthesia, and awakened, extubated, and taken to the PACU in satisfactory condition.  Dispo:   PACU to home  Plan: Ice, elevation, narcotic analgesia, and prophylactic antibiotics.  We will reevaluate the patient in the office in 6 days and remove the septal splints.  Return to work in 10 days, strenuous activities in two weeks.   Iris Hairston 05/07/2018 12:04 PM

## 2018-05-07 NOTE — Anesthesia Preprocedure Evaluation (Signed)
Anesthesia Evaluation  Patient identified by MRN, date of birth, ID band Patient awake    Reviewed: Allergy & Precautions, NPO status , Patient's Chart, lab work & pertinent test results  History of Anesthesia Complications (+) PONV and history of anesthetic complications  Airway Mallampati: II  TM Distance: >3 FB Neck ROM: Full  Mouth opening: Limited Mouth Opening  Dental no notable dental hx.    Pulmonary asthma , sleep apnea (only occasionally uses her CPAP) and Continuous Positive Airway Pressure Ventilation ,    breath sounds clear to auscultation- rhonchi (-) wheezing      Cardiovascular Exercise Tolerance: Good (-) hypertension(-) CAD, (-) Past MI, (-) Cardiac Stents and (-) CABG  Rhythm:Regular Rate:Normal - Systolic murmurs and - Diastolic murmurs    Neuro/Psych  Headaches, PSYCHIATRIC DISORDERS Anxiety Depression    GI/Hepatic Neg liver ROS, hiatal hernia, GERD  ,  Endo/Other  negative endocrine ROSneg diabetes  Renal/GU negative Renal ROS     Musculoskeletal negative musculoskeletal ROS (+)   Abdominal (+) + obese,   Peds  Hematology  (+) anemia ,   Anesthesia Other Findings Past Medical History: No date: Anemia No date: Asthma No date: Barrett esophagus No date: Complication of anesthesia     Comment:  Pt stopped breathing during EGD but did well with recent              Gastric Bypass Surgery No date: Depression No date: GERD (gastroesophageal reflux disease)     Comment:  H/O No date: Headache     Comment:  H/O MIGRAINES No date: History of hiatal hernia     Comment:  SMALL No date: PONV (postoperative nausea and vomiting) No date: Sleep apnea     Comment:  PT IS NOT USING CPAP   Reproductive/Obstetrics                             Anesthesia Physical Anesthesia Plan  ASA: II  Anesthesia Plan: General   Post-op Pain Management:    Induction:  Intravenous  PONV Risk Score and Plan: 3 and Ondansetron, Dexamethasone, Propofol infusion, Midazolam and Scopolamine patch - Pre-op  Airway Management Planned: Oral ETT  Additional Equipment:   Intra-op Plan:   Post-operative Plan: Extubation in OR  Informed Consent: I have reviewed the patients History and Physical, chart, labs and discussed the procedure including the risks, benefits and alternatives for the proposed anesthesia with the patient or authorized representative who has indicated his/her understanding and acceptance.   Dental advisory given  Plan Discussed with: CRNA and Anesthesiologist  Anesthesia Plan Comments:         Anesthesia Quick Evaluation

## 2018-05-07 NOTE — Transfer of Care (Signed)
Immediate Anesthesia Transfer of Care Note  Patient: Kimberly Santiago  Procedure(s) Performed: Procedure(s): IMAGE GUIDED SINUS SURGERY (N/A) NASAL SEPTOPLASTY WITH TURBINATE REDUCTION (Bilateral) MAXILLARY ANTROSTOMY (Bilateral) ETHMOIDECTOMY (Right) FRONTAL SINUS EXPLORATION (Right) SPHENOIDECTOMY (Right)  Patient Location: PACU  Anesthesia Type:General  Level of Consciousness: sedated  Airway & Oxygen Therapy: Patient Spontanous Breathing and Patient connected to face mask oxygen  Post-op Assessment: Report given to RN and Post -op Vital signs reviewed and stable  Post vital signs: Reviewed and stable  Last Vitals:  Vitals:   05/07/18 0755 05/07/18 1219  BP: 122/81 138/73  Pulse: 74 100  Resp: 17 (!) 8  Temp: 36.7 C (!) 36.3 C  SpO2: 99% 98%    Complications: No apparent anesthesia complications

## 2018-05-08 ENCOUNTER — Encounter: Payer: Self-pay | Admitting: Otolaryngology

## 2018-05-08 NOTE — Anesthesia Postprocedure Evaluation (Signed)
Anesthesia Post Note  Patient: ISABELLAMARIE RANDA  Procedure(s) Performed: IMAGE GUIDED SINUS SURGERY (N/A ) NASAL SEPTOPLASTY WITH TURBINATE REDUCTION (Bilateral ) MAXILLARY ANTROSTOMY (Bilateral ) ETHMOIDECTOMY (Right ) FRONTAL SINUS EXPLORATION (Right ) SPHENOIDECTOMY (Right )  Patient location during evaluation: PACU Anesthesia Type: General Level of consciousness: awake and alert and oriented Pain management: pain level controlled Vital Signs Assessment: post-procedure vital signs reviewed and stable Respiratory status: spontaneous breathing, nonlabored ventilation and respiratory function stable Cardiovascular status: blood pressure returned to baseline and stable Postop Assessment: no signs of nausea or vomiting Anesthetic complications: no     Last Vitals:  Vitals:   05/07/18 1443 05/07/18 1526  BP: 124/68 (P) 123/68  Pulse: 86 (P) 75  Resp: 14 (P) 16  Temp: (!) 36.1 C (!) (P) 36.1 C  SpO2:  (P) 94%    Last Pain:  Vitals:   05/07/18 1526  TempSrc: (P) Temporal  PainSc: (P) 7                  Usha Slager

## 2018-05-11 LAB — SURGICAL PATHOLOGY

## 2018-05-12 ENCOUNTER — Inpatient Hospital Stay: Admission: RE | Admit: 2018-05-12 | Payer: BLUE CROSS/BLUE SHIELD | Source: Ambulatory Visit

## 2018-05-14 DIAGNOSIS — J328 Other chronic sinusitis: Secondary | ICD-10-CM | POA: Diagnosis not present

## 2018-06-02 DIAGNOSIS — J323 Chronic sphenoidal sinusitis: Secondary | ICD-10-CM | POA: Diagnosis not present

## 2018-06-02 DIAGNOSIS — J328 Other chronic sinusitis: Secondary | ICD-10-CM | POA: Diagnosis not present

## 2018-06-02 DIAGNOSIS — J322 Chronic ethmoidal sinusitis: Secondary | ICD-10-CM | POA: Diagnosis not present

## 2018-07-02 ENCOUNTER — Ambulatory Visit
Admission: RE | Admit: 2018-07-02 | Discharge: 2018-07-02 | Disposition: A | Discharge status: Home / Self Care | Payer: BLUE CROSS/BLUE SHIELD | Source: Ambulatory Visit | Attending: Family Medicine | Admitting: Family Medicine

## 2018-07-02 ENCOUNTER — Encounter (INDEPENDENT_AMBULATORY_CARE_PROVIDER_SITE_OTHER): Payer: Self-pay

## 2018-07-02 DIAGNOSIS — Z1231 Encounter for screening mammogram for malignant neoplasm of breast: Secondary | ICD-10-CM

## 2018-07-02 IMAGING — MG DIGITAL SCREENING BILATERAL MAMMOGRAM WITH TOMO AND CAD
8 series · 8 of 24 positions shown · non-contrast
Comparison: Previous exam(s).

CLINICAL DATA: Screening.

EXAM:
DIGITAL SCREENING BILATERAL MAMMOGRAM WITH TOMO AND CAD

[R CC synth-2D]
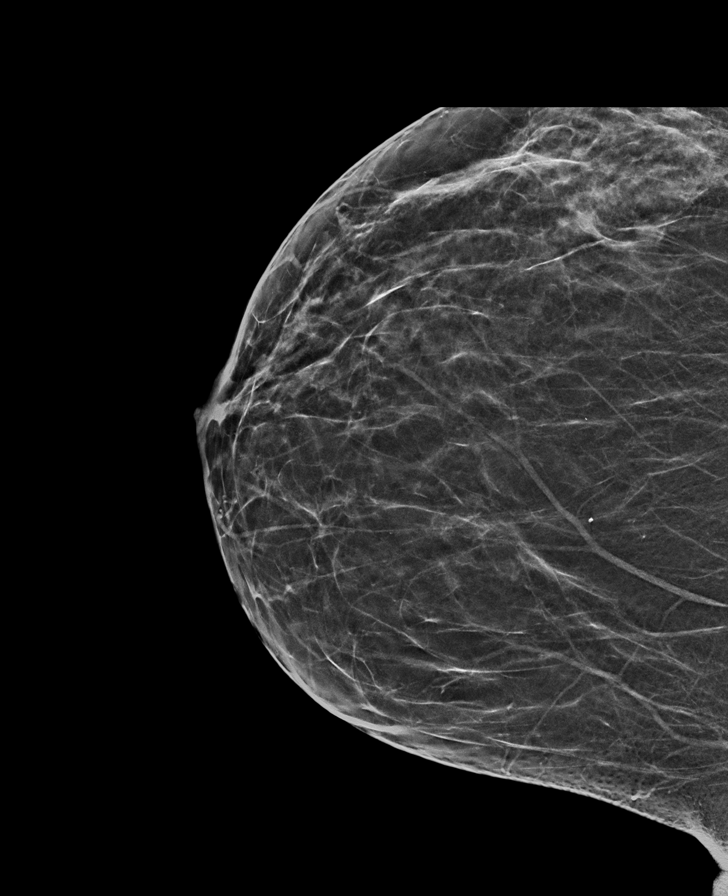

[L MLO synth-2D]
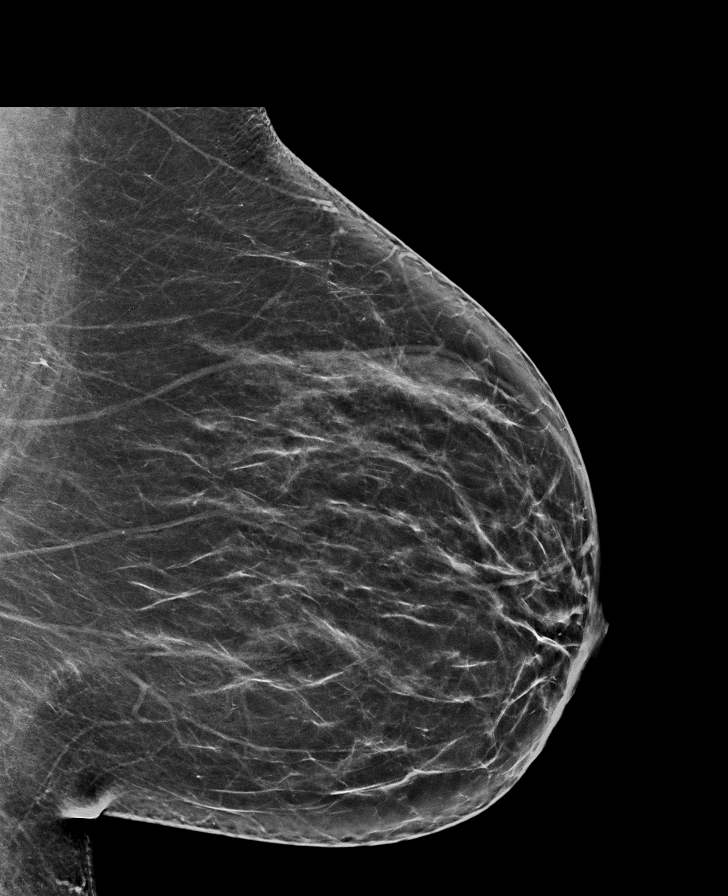

[R MLO synth-2D]
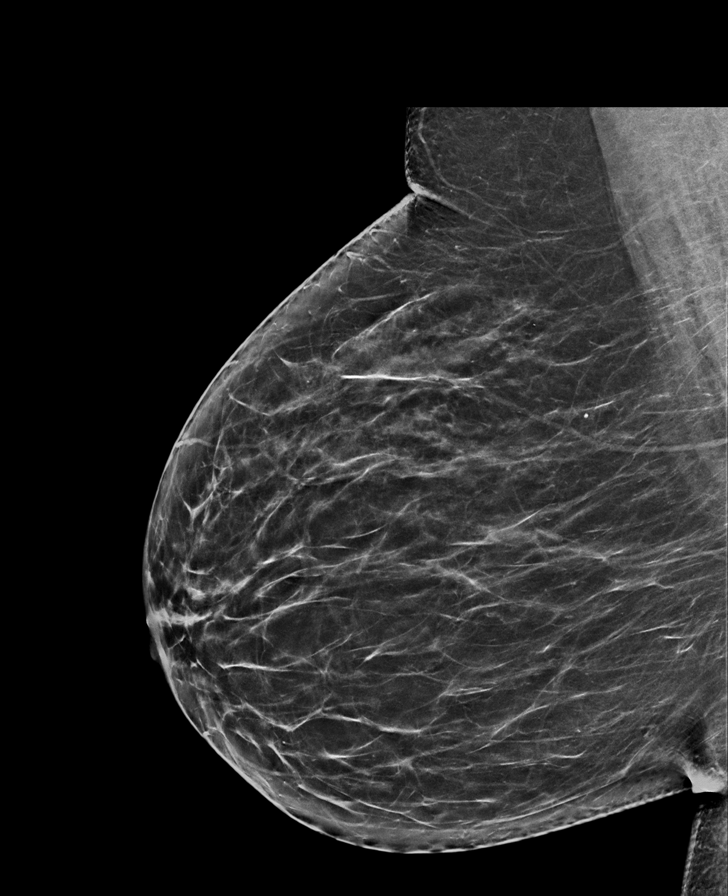

[L CC synth-2D]
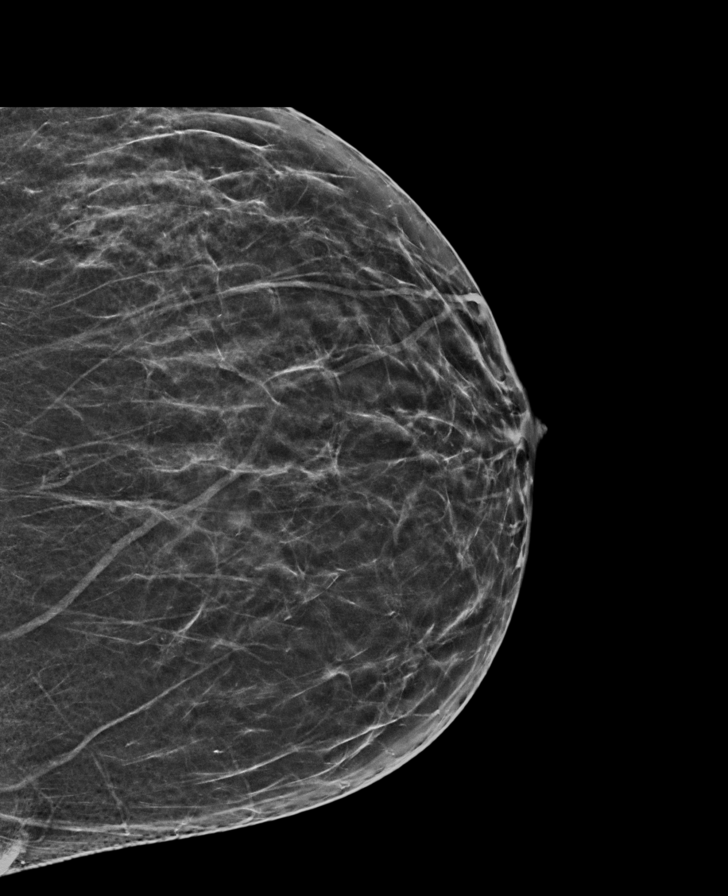

[R MLO tomo · tomo slice 37/73.0]
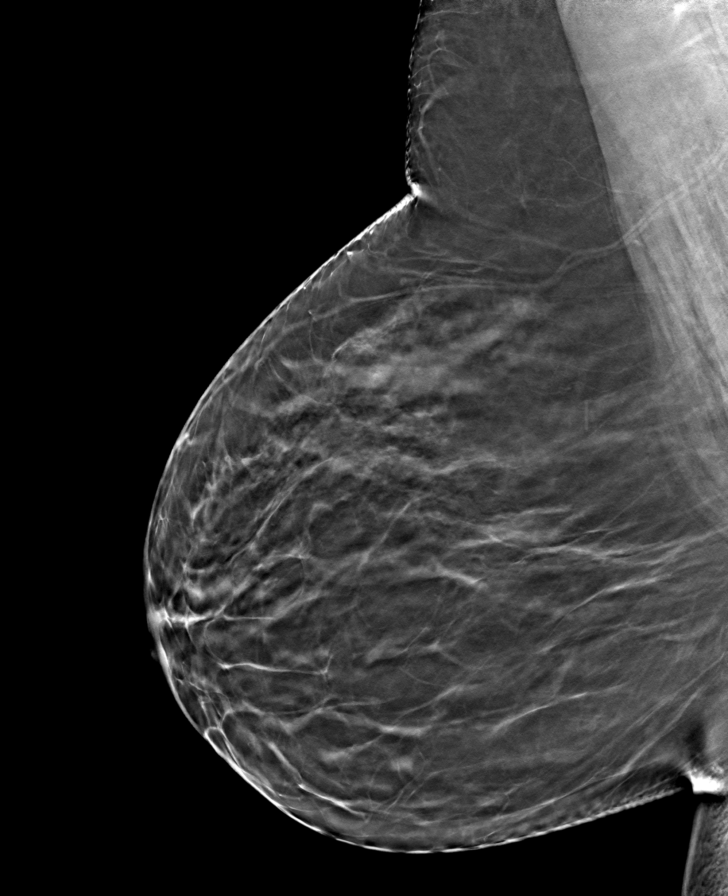

[L MLO tomo · tomo slice 40/79.0]
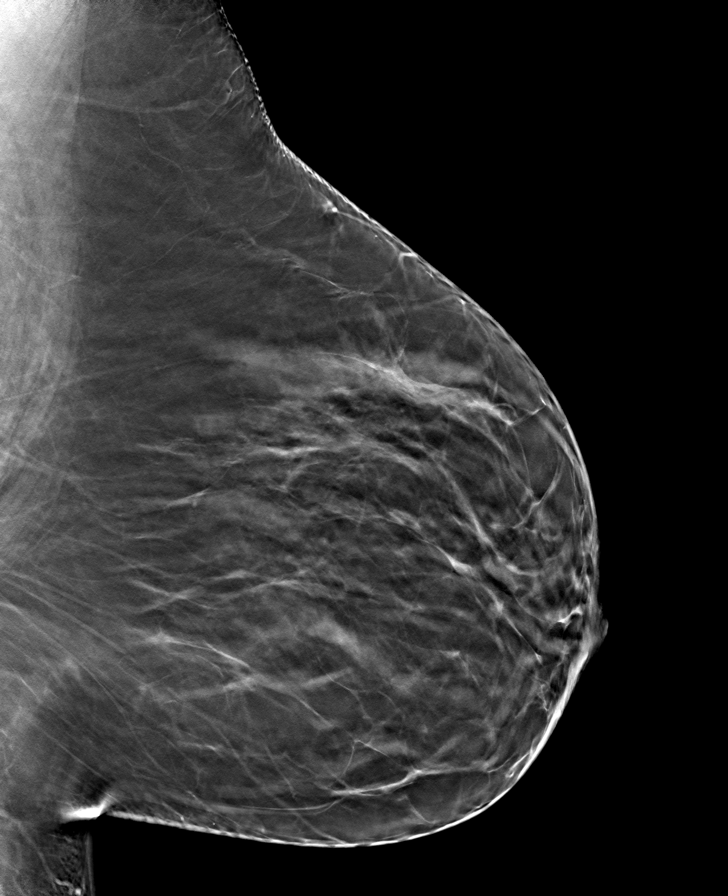

[R CC tomo · tomo slice 29/58.0]
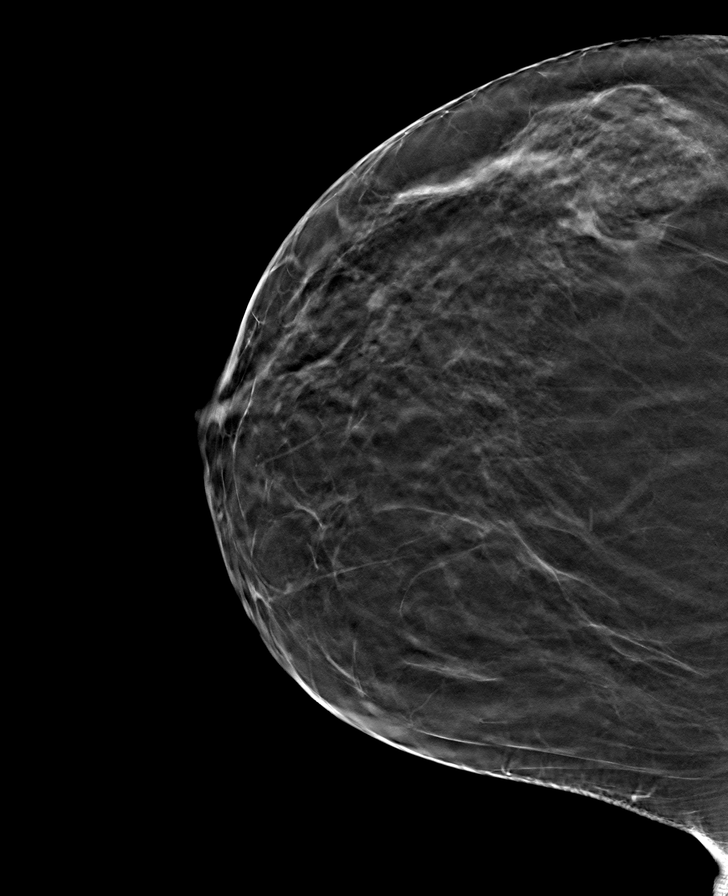

[L CC tomo · tomo slice 31/60.0]
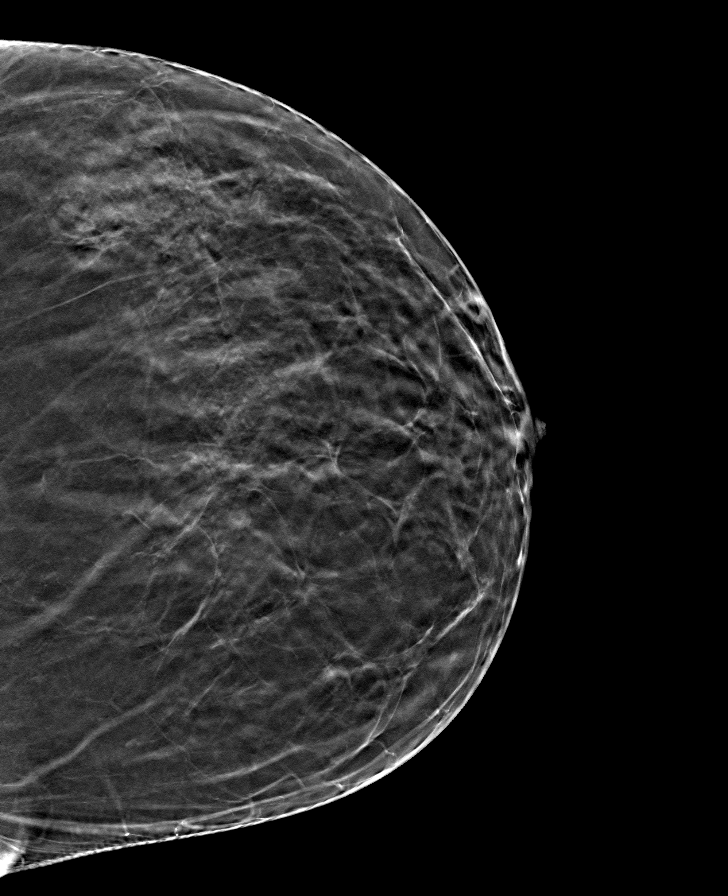

[8 of 24 positions shown; findings below may reference images not displayed]

ACR Breast Density Category b: There are scattered areas of
fibroglandular density.
FINDINGS: There are no findings suspicious for malignancy. Images were
processed with CAD.
IMPRESSION: No mammographic evidence of malignancy. A result letter of this
screening mammogram will be mailed directly to the patient.

RECOMMENDATION:
Screening mammogram in one year. (Code:[TQ])

BI-RADS CATEGORY  1: Negative.

## 2018-07-03 DIAGNOSIS — J328 Other chronic sinusitis: Secondary | ICD-10-CM | POA: Diagnosis not present

## 2018-07-09 DIAGNOSIS — L708 Other acne: Secondary | ICD-10-CM | POA: Diagnosis not present

## 2018-07-09 DIAGNOSIS — L731 Pseudofolliculitis barbae: Secondary | ICD-10-CM | POA: Diagnosis not present

## 2018-07-09 DIAGNOSIS — R208 Other disturbances of skin sensation: Secondary | ICD-10-CM | POA: Diagnosis not present

## 2018-08-14 DIAGNOSIS — Z3141 Encounter for fertility testing: Secondary | ICD-10-CM | POA: Diagnosis not present

## 2018-08-14 DIAGNOSIS — N8500 Endometrial hyperplasia, unspecified: Secondary | ICD-10-CM | POA: Diagnosis not present

## 2018-10-28 DIAGNOSIS — Z9884 Bariatric surgery status: Secondary | ICD-10-CM | POA: Diagnosis not present

## 2018-10-28 DIAGNOSIS — Z885 Allergy status to narcotic agent status: Secondary | ICD-10-CM | POA: Diagnosis not present

## 2018-10-28 DIAGNOSIS — Z9049 Acquired absence of other specified parts of digestive tract: Secondary | ICD-10-CM | POA: Diagnosis not present

## 2018-10-28 DIAGNOSIS — M25531 Pain in right wrist: Secondary | ICD-10-CM | POA: Diagnosis not present

## 2018-10-28 DIAGNOSIS — S301XXA Contusion of abdominal wall, initial encounter: Secondary | ICD-10-CM | POA: Diagnosis not present

## 2018-10-28 DIAGNOSIS — S52611A Displaced fracture of right ulna styloid process, initial encounter for closed fracture: Secondary | ICD-10-CM | POA: Diagnosis not present

## 2018-10-28 DIAGNOSIS — R110 Nausea: Secondary | ICD-10-CM | POA: Diagnosis not present

## 2018-10-28 DIAGNOSIS — R079 Chest pain, unspecified: Secondary | ICD-10-CM | POA: Diagnosis not present

## 2018-10-28 DIAGNOSIS — R200 Anesthesia of skin: Secondary | ICD-10-CM | POA: Diagnosis not present

## 2018-10-28 DIAGNOSIS — M79641 Pain in right hand: Secondary | ICD-10-CM | POA: Diagnosis not present

## 2018-11-03 DIAGNOSIS — F3342 Major depressive disorder, recurrent, in full remission: Secondary | ICD-10-CM | POA: Diagnosis not present

## 2018-11-03 DIAGNOSIS — S134XXD Sprain of ligaments of cervical spine, subsequent encounter: Secondary | ICD-10-CM | POA: Diagnosis not present

## 2018-11-03 DIAGNOSIS — T2220XD Burn of second degree of shoulder and upper limb, except wrist and hand, unspecified site, subsequent encounter: Secondary | ICD-10-CM | POA: Diagnosis not present

## 2018-11-03 DIAGNOSIS — S6991XA Unspecified injury of right wrist, hand and finger(s), initial encounter: Secondary | ICD-10-CM | POA: Diagnosis not present

## 2018-11-03 DIAGNOSIS — M25531 Pain in right wrist: Secondary | ICD-10-CM | POA: Diagnosis not present

## 2018-11-03 DIAGNOSIS — L089 Local infection of the skin and subcutaneous tissue, unspecified: Secondary | ICD-10-CM | POA: Diagnosis not present

## 2018-11-07 DIAGNOSIS — J0110 Acute frontal sinusitis, unspecified: Secondary | ICD-10-CM | POA: Diagnosis not present

## 2018-11-10 DIAGNOSIS — M25531 Pain in right wrist: Secondary | ICD-10-CM | POA: Diagnosis not present

## 2018-11-10 DIAGNOSIS — S52611A Displaced fracture of right ulna styloid process, initial encounter for closed fracture: Secondary | ICD-10-CM | POA: Diagnosis not present

## 2018-11-10 DIAGNOSIS — S63501A Unspecified sprain of right wrist, initial encounter: Secondary | ICD-10-CM | POA: Diagnosis not present

## 2018-11-10 DIAGNOSIS — S6991XA Unspecified injury of right wrist, hand and finger(s), initial encounter: Secondary | ICD-10-CM | POA: Diagnosis not present

## 2018-11-20 DIAGNOSIS — Z3200 Encounter for pregnancy test, result unknown: Secondary | ICD-10-CM | POA: Diagnosis not present

## 2018-11-24 DIAGNOSIS — Z3201 Encounter for pregnancy test, result positive: Secondary | ICD-10-CM | POA: Diagnosis not present

## 2018-12-04 DIAGNOSIS — Z3200 Encounter for pregnancy test, result unknown: Secondary | ICD-10-CM | POA: Diagnosis not present

## 2018-12-18 DIAGNOSIS — O0900 Supervision of pregnancy with history of infertility, unspecified trimester: Secondary | ICD-10-CM | POA: Diagnosis not present

## 2018-12-22 DIAGNOSIS — S6991XD Unspecified injury of right wrist, hand and finger(s), subsequent encounter: Secondary | ICD-10-CM | POA: Diagnosis not present

## 2019-01-06 DIAGNOSIS — Z118 Encounter for screening for other infectious and parasitic diseases: Secondary | ICD-10-CM | POA: Diagnosis not present

## 2019-01-06 DIAGNOSIS — X58XXXA Exposure to other specified factors, initial encounter: Secondary | ICD-10-CM | POA: Diagnosis not present

## 2019-01-06 DIAGNOSIS — O99841 Bariatric surgery status complicating pregnancy, first trimester: Secondary | ICD-10-CM | POA: Diagnosis not present

## 2019-01-06 DIAGNOSIS — S6721XA Crushing injury of right hand, initial encounter: Secondary | ICD-10-CM | POA: Diagnosis not present

## 2019-01-06 DIAGNOSIS — Z3A10 10 weeks gestation of pregnancy: Secondary | ICD-10-CM | POA: Diagnosis not present

## 2019-01-06 DIAGNOSIS — O3680X0 Pregnancy with inconclusive fetal viability, not applicable or unspecified: Secondary | ICD-10-CM | POA: Diagnosis not present

## 2019-01-06 DIAGNOSIS — O99840 Bariatric surgery status complicating pregnancy, unspecified trimester: Secondary | ICD-10-CM | POA: Diagnosis not present

## 2019-01-06 DIAGNOSIS — Z3689 Encounter for other specified antenatal screening: Secondary | ICD-10-CM | POA: Diagnosis not present

## 2019-01-06 DIAGNOSIS — O99210 Obesity complicating pregnancy, unspecified trimester: Secondary | ICD-10-CM | POA: Diagnosis not present

## 2019-01-06 DIAGNOSIS — S6991XA Unspecified injury of right wrist, hand and finger(s), initial encounter: Secondary | ICD-10-CM | POA: Diagnosis not present

## 2019-01-06 DIAGNOSIS — O09521 Supervision of elderly multigravida, first trimester: Secondary | ICD-10-CM | POA: Diagnosis not present

## 2019-01-06 LAB — OB RESULTS CONSOLE RPR: RPR: NONREACTIVE

## 2019-01-06 LAB — OB RESULTS CONSOLE HIV ANTIBODY (ROUTINE TESTING): HIV: NONREACTIVE

## 2019-01-06 LAB — OB RESULTS CONSOLE RUBELLA ANTIBODY, IGM: Rubella: IMMUNE

## 2019-01-06 LAB — OB RESULTS CONSOLE ABO/RH: RH Type: POSITIVE

## 2019-01-06 LAB — OB RESULTS CONSOLE ANTIBODY SCREEN: Antibody Screen: NEGATIVE

## 2019-01-06 LAB — OB RESULTS CONSOLE GC/CHLAMYDIA
Chlamydia: NEGATIVE
Gonorrhea: NEGATIVE

## 2019-01-06 LAB — OB RESULTS CONSOLE HEPATITIS B SURFACE ANTIGEN: Hepatitis B Surface Ag: NEGATIVE

## 2019-01-15 DIAGNOSIS — S6991XD Unspecified injury of right wrist, hand and finger(s), subsequent encounter: Secondary | ICD-10-CM | POA: Diagnosis not present

## 2019-01-22 DIAGNOSIS — R8271 Bacteriuria: Secondary | ICD-10-CM | POA: Diagnosis not present

## 2019-02-02 DIAGNOSIS — B372 Candidiasis of skin and nail: Secondary | ICD-10-CM | POA: Diagnosis not present

## 2019-02-02 DIAGNOSIS — R8271 Bacteriuria: Secondary | ICD-10-CM | POA: Diagnosis not present

## 2019-02-02 DIAGNOSIS — O09522 Supervision of elderly multigravida, second trimester: Secondary | ICD-10-CM | POA: Diagnosis not present

## 2019-02-02 DIAGNOSIS — Z3A14 14 weeks gestation of pregnancy: Secondary | ICD-10-CM | POA: Diagnosis not present

## 2019-02-15 DIAGNOSIS — Z361 Encounter for antenatal screening for raised alphafetoprotein level: Secondary | ICD-10-CM | POA: Diagnosis not present

## 2019-02-15 DIAGNOSIS — Z3686 Encounter for antenatal screening for cervical length: Secondary | ICD-10-CM | POA: Diagnosis not present

## 2019-02-15 DIAGNOSIS — Z3A16 16 weeks gestation of pregnancy: Secondary | ICD-10-CM | POA: Diagnosis not present

## 2019-02-15 DIAGNOSIS — O99840 Bariatric surgery status complicating pregnancy, unspecified trimester: Secondary | ICD-10-CM | POA: Diagnosis not present

## 2019-02-15 DIAGNOSIS — O09522 Supervision of elderly multigravida, second trimester: Secondary | ICD-10-CM | POA: Diagnosis not present

## 2019-03-04 DIAGNOSIS — O09522 Supervision of elderly multigravida, second trimester: Secondary | ICD-10-CM | POA: Diagnosis not present

## 2019-03-04 DIAGNOSIS — Z3A18 18 weeks gestation of pregnancy: Secondary | ICD-10-CM | POA: Diagnosis not present

## 2019-03-11 DIAGNOSIS — O09522 Supervision of elderly multigravida, second trimester: Secondary | ICD-10-CM | POA: Diagnosis not present

## 2019-03-11 DIAGNOSIS — Z3A19 19 weeks gestation of pregnancy: Secondary | ICD-10-CM | POA: Diagnosis not present

## 2019-04-08 DIAGNOSIS — Z23 Encounter for immunization: Secondary | ICD-10-CM | POA: Diagnosis not present

## 2019-04-08 DIAGNOSIS — Z3A23 23 weeks gestation of pregnancy: Secondary | ICD-10-CM | POA: Diagnosis not present

## 2019-04-08 DIAGNOSIS — O09522 Supervision of elderly multigravida, second trimester: Secondary | ICD-10-CM | POA: Diagnosis not present

## 2019-05-03 DIAGNOSIS — Z3A27 27 weeks gestation of pregnancy: Secondary | ICD-10-CM | POA: Diagnosis not present

## 2019-05-03 DIAGNOSIS — O09522 Supervision of elderly multigravida, second trimester: Secondary | ICD-10-CM | POA: Diagnosis not present

## 2019-05-03 DIAGNOSIS — O99840 Bariatric surgery status complicating pregnancy, unspecified trimester: Secondary | ICD-10-CM | POA: Diagnosis not present

## 2019-05-03 DIAGNOSIS — O99841 Bariatric surgery status complicating pregnancy, first trimester: Secondary | ICD-10-CM | POA: Diagnosis not present

## 2019-05-05 ENCOUNTER — Other Ambulatory Visit: Payer: Self-pay | Admitting: Obstetrics

## 2019-05-12 ENCOUNTER — Telehealth (HOSPITAL_COMMUNITY): Payer: Self-pay | Admitting: General Practice

## 2019-05-12 NOTE — Telephone Encounter (Signed)
Patient is scheduled to receive iron infusion on 05/14/2019 at the [facility]. Patient wanted to know if she should eat, drink before coming and also she had insurance related questions. Patient wanted to know if there is there anything different she needs to do before coming. RN referred the patient to call her health care provider to get the correct answers to all the questions. Patient verbalized understanding.

## 2019-05-14 ENCOUNTER — Other Ambulatory Visit: Payer: Self-pay

## 2019-05-14 ENCOUNTER — Ambulatory Visit (HOSPITAL_COMMUNITY)
Admission: RE | Admit: 2019-05-14 | Discharge: 2019-05-14 | Disposition: A | Discharge status: Home / Self Care | Payer: BC Managed Care – PPO | Source: Ambulatory Visit | Attending: Internal Medicine | Admitting: Internal Medicine

## 2019-05-14 DIAGNOSIS — Z3A00 Weeks of gestation of pregnancy not specified: Secondary | ICD-10-CM | POA: Diagnosis not present

## 2019-05-14 DIAGNOSIS — O99213 Obesity complicating pregnancy, third trimester: Secondary | ICD-10-CM | POA: Diagnosis not present

## 2019-05-14 DIAGNOSIS — E669 Obesity, unspecified: Secondary | ICD-10-CM | POA: Insufficient documentation

## 2019-05-14 DIAGNOSIS — O99013 Anemia complicating pregnancy, third trimester: Secondary | ICD-10-CM | POA: Diagnosis not present

## 2019-05-14 DIAGNOSIS — Z9884 Bariatric surgery status: Secondary | ICD-10-CM | POA: Insufficient documentation

## 2019-05-14 DIAGNOSIS — D509 Iron deficiency anemia, unspecified: Secondary | ICD-10-CM | POA: Insufficient documentation

## 2019-05-14 MED ORDER — SODIUM CHLORIDE 0.9 % IV SOLN
INTRAVENOUS | Status: DC | PRN
  Administered 2019-05-14: 250 mL via INTRAVENOUS

## 2019-05-14 MED ORDER — SODIUM CHLORIDE 0.9 % IV SOLN
510.0000 mg | Freq: Once | INTRAVENOUS | Status: AC
  Administered 2019-05-14: 510 mg via INTRAVENOUS
  Filled 2019-05-14: qty 17

## 2019-05-14 NOTE — Discharge Instructions (Signed)

## 2019-05-14 NOTE — Progress Notes (Signed)
Patient received Feraheme via PIV. Observed for at least 30 minutes post infusion.Tolerated well, vitals stable, discharge instructions given, verbalized understanding. Patient alert, oriented and ambulatory at the time of discharge.  

## 2019-05-17 DIAGNOSIS — Z23 Encounter for immunization: Secondary | ICD-10-CM | POA: Diagnosis not present

## 2019-05-17 DIAGNOSIS — O99013 Anemia complicating pregnancy, third trimester: Secondary | ICD-10-CM | POA: Diagnosis not present

## 2019-05-17 DIAGNOSIS — Z3A29 29 weeks gestation of pregnancy: Secondary | ICD-10-CM | POA: Diagnosis not present

## 2019-05-21 ENCOUNTER — Ambulatory Visit (HOSPITAL_COMMUNITY)
Admission: RE | Admit: 2019-05-21 | Discharge: 2019-05-21 | Disposition: A | Discharge status: Home / Self Care | Payer: BC Managed Care – PPO | Source: Ambulatory Visit | Attending: Internal Medicine | Admitting: Internal Medicine

## 2019-05-21 ENCOUNTER — Other Ambulatory Visit: Payer: Self-pay

## 2019-05-21 DIAGNOSIS — Z3A00 Weeks of gestation of pregnancy not specified: Secondary | ICD-10-CM | POA: Diagnosis not present

## 2019-05-21 DIAGNOSIS — Z9884 Bariatric surgery status: Secondary | ICD-10-CM | POA: Diagnosis not present

## 2019-05-21 DIAGNOSIS — D509 Iron deficiency anemia, unspecified: Secondary | ICD-10-CM | POA: Diagnosis not present

## 2019-05-21 DIAGNOSIS — O99013 Anemia complicating pregnancy, third trimester: Secondary | ICD-10-CM | POA: Diagnosis not present

## 2019-05-21 DIAGNOSIS — E669 Obesity, unspecified: Secondary | ICD-10-CM | POA: Diagnosis not present

## 2019-05-21 DIAGNOSIS — O99213 Obesity complicating pregnancy, third trimester: Secondary | ICD-10-CM | POA: Diagnosis not present

## 2019-05-21 MED ORDER — SODIUM CHLORIDE 0.9 % IV SOLN
510.0000 mg | Freq: Once | INTRAVENOUS | Status: AC
  Administered 2019-05-21: 510 mg via INTRAVENOUS
  Filled 2019-05-21: qty 17

## 2019-05-21 MED ORDER — SODIUM CHLORIDE 0.9 % IV SOLN
INTRAVENOUS | Status: DC | PRN
  Administered 2019-05-21: 250 mL via INTRAVENOUS

## 2019-05-21 NOTE — Progress Notes (Signed)
PATIENT CARE CENTER NOTE   Procedure: IV Feraheme   Note: Patient received Feraheme infusion via PIV. Tolerated well with no adverse reaction. Vital signs stable. Discharge instructions given. Alert, oriented and ambulatory at discharge.  Coolidge Breeze, RN 05/21/2019

## 2019-05-21 NOTE — Discharge Instructions (Signed)

## 2019-06-01 DIAGNOSIS — O99013 Anemia complicating pregnancy, third trimester: Secondary | ICD-10-CM | POA: Diagnosis not present

## 2019-06-01 DIAGNOSIS — Z3A31 31 weeks gestation of pregnancy: Secondary | ICD-10-CM | POA: Diagnosis not present

## 2019-06-14 DIAGNOSIS — Z3689 Encounter for other specified antenatal screening: Secondary | ICD-10-CM | POA: Diagnosis not present

## 2019-06-14 DIAGNOSIS — O99843 Bariatric surgery status complicating pregnancy, third trimester: Secondary | ICD-10-CM | POA: Diagnosis not present

## 2019-06-14 DIAGNOSIS — Z3A33 33 weeks gestation of pregnancy: Secondary | ICD-10-CM | POA: Diagnosis not present

## 2019-06-23 DIAGNOSIS — Z3A34 34 weeks gestation of pregnancy: Secondary | ICD-10-CM | POA: Diagnosis not present

## 2019-06-23 DIAGNOSIS — R350 Frequency of micturition: Secondary | ICD-10-CM | POA: Diagnosis not present

## 2019-06-23 DIAGNOSIS — O99843 Bariatric surgery status complicating pregnancy, third trimester: Secondary | ICD-10-CM | POA: Diagnosis not present

## 2019-06-23 LAB — OB RESULTS CONSOLE GBS: GBS: POSITIVE

## 2019-06-30 DIAGNOSIS — O09523 Supervision of elderly multigravida, third trimester: Secondary | ICD-10-CM | POA: Diagnosis not present

## 2019-06-30 DIAGNOSIS — O99843 Bariatric surgery status complicating pregnancy, third trimester: Secondary | ICD-10-CM | POA: Diagnosis not present

## 2019-06-30 DIAGNOSIS — Z3A35 35 weeks gestation of pregnancy: Secondary | ICD-10-CM | POA: Diagnosis not present

## 2019-07-05 ENCOUNTER — Other Ambulatory Visit: Payer: Self-pay | Admitting: Obstetrics

## 2019-07-07 DIAGNOSIS — R8271 Bacteriuria: Secondary | ICD-10-CM | POA: Diagnosis not present

## 2019-07-07 DIAGNOSIS — O99843 Bariatric surgery status complicating pregnancy, third trimester: Secondary | ICD-10-CM | POA: Diagnosis not present

## 2019-07-07 DIAGNOSIS — Z3A36 36 weeks gestation of pregnancy: Secondary | ICD-10-CM | POA: Diagnosis not present

## 2019-07-07 DIAGNOSIS — O09523 Supervision of elderly multigravida, third trimester: Secondary | ICD-10-CM | POA: Diagnosis not present

## 2019-07-14 ENCOUNTER — Encounter (HOSPITAL_COMMUNITY): Payer: Self-pay

## 2019-07-14 ENCOUNTER — Encounter (HOSPITAL_COMMUNITY): Payer: Self-pay | Admitting: *Deleted

## 2019-07-14 DIAGNOSIS — O99843 Bariatric surgery status complicating pregnancy, third trimester: Secondary | ICD-10-CM | POA: Diagnosis not present

## 2019-07-14 DIAGNOSIS — Z3A37 37 weeks gestation of pregnancy: Secondary | ICD-10-CM | POA: Diagnosis not present

## 2019-07-14 DIAGNOSIS — O09523 Supervision of elderly multigravida, third trimester: Secondary | ICD-10-CM | POA: Diagnosis not present

## 2019-07-14 NOTE — Patient Instructions (Signed)
Kimberly Santiago  07/14/2019   Your procedure is scheduled on:  07/27/2019  Arrive at 0530 at Entrance C on [address] at [facility]  and [employer]. You are invited to use the FREE valet parking or use the Visitor's parking deck.  Pick up the phone at the desk and dial [phone].  Call this number if you have problems the morning of surgery: [phone]  Remember:   Do not eat food:(After Midnight) Desps de medianoche.  Do not drink clear liquids: (After Midnight) Desps de medianoche.  Take these medicines the morning of surgery with A SIP OF WATER:  May take protonix   Do not wear jewelry, make-up or nail polish.  Do not wear lotions, powders, or perfumes. Do not wear deodorant.  Do not shave 48 hours prior to surgery.  Do not bring valuables to the hospital.  [facility] is not   responsible for any belongings or valuables brought to the hospital.  Contacts, dentures or bridgework may not be worn into surgery.  Leave suitcase in the car. After surgery it may be brought to your room.  For patients admitted to the hospital, checkout time is 11:00 AM the day of              discharge.      Please read over the following fact sheets that you were given:     Preparing for Surgery

## 2019-07-22 DIAGNOSIS — O09523 Supervision of elderly multigravida, third trimester: Secondary | ICD-10-CM | POA: Diagnosis not present

## 2019-07-22 DIAGNOSIS — Z3A38 38 weeks gestation of pregnancy: Secondary | ICD-10-CM | POA: Diagnosis not present

## 2019-07-25 ENCOUNTER — Other Ambulatory Visit: Payer: Self-pay

## 2019-07-25 ENCOUNTER — Other Ambulatory Visit (HOSPITAL_COMMUNITY)
Admission: RE | Admit: 2019-07-25 | Discharge: 2019-07-25 | Disposition: A | Discharge status: Home / Self Care | Payer: BC Managed Care – PPO | Source: Ambulatory Visit | Attending: Obstetrics | Admitting: Obstetrics

## 2019-07-25 DIAGNOSIS — Z20822 Contact with and (suspected) exposure to covid-19: Secondary | ICD-10-CM | POA: Diagnosis not present

## 2019-07-25 DIAGNOSIS — Z01812 Encounter for preprocedural laboratory examination: Secondary | ICD-10-CM | POA: Diagnosis not present

## 2019-07-25 HISTORY — DX: Polycystic ovarian syndrome: E28.2

## 2019-07-25 LAB — RPR: RPR Ser Ql: NONREACTIVE

## 2019-07-25 LAB — SARS CORONAVIRUS 2 (TAT 6-24 HRS): SARS Coronavirus 2: NEGATIVE

## 2019-07-25 LAB — CBC
HCT: 40.9 % (ref 36.0–46.0)
Hemoglobin: 13.8 g/dL (ref 12.0–15.0)
MCH: 30.3 pg (ref 26.0–34.0)
MCHC: 33.7 g/dL (ref 30.0–36.0)
MCV: 89.7 fL (ref 80.0–100.0)
Platelets: 230 10*3/uL (ref 150–400)
RBC: 4.56 MIL/uL (ref 3.87–5.11)
RDW: 13 % (ref 11.5–15.5)
WBC: 8.9 10*3/uL (ref 4.0–10.5)
nRBC: 0 % (ref 0.0–0.2)

## 2019-07-25 LAB — TYPE AND SCREEN
ABO/RH(D): AB POS
Antibody Screen: NEGATIVE

## 2019-07-25 LAB — ABO/RH: ABO/RH(D): AB POS

## 2019-07-25 NOTE — MAU Note (Signed)
Pt here for PAT covid swab and lab draw. Denies symptoms. Swab collected. 

## 2019-07-26 NOTE — H&P (Signed)
Kimberly Santiago is a 44 y.o. G2P0101 at [redacted]w[redacted]d presenting for RCS with b/l salpingectomy. Pt notes continued mild contractions. Good fetal movement, No vaginal bleeding, not leaking fluid.  P[facility]are at [facility] since 10 wks - IVF preg, SET/ FET with nl PGD, u/s at 5 and 7 wks - h/o endometriosis. Known R endometrioma, assess at c/s - prior c/s for breech/ PROM at 37 wks, planned RCS - undesired fertility, TL/ salpingectomy planned - GBS pos - obesity, prior gastric bypass, 7# wt loss in preg. - polyhydramnios, unable to do 3hr GTT, nl BS monthly - AMA, nl PGD - Anxiety/ depression, on celexa 40mg / day, needed to add Buspar in 3rd trimester - GERD, on Protonix - anemia, IV iron x 2 at 29 and 30 wks   Prenatal Transfer Tool  Maternal Diabetes: No Genetic Screening: Normal Maternal Ultrasounds/Referrals: Normal Fetal Ultrasounds or other Referrals:  None Maternal Substance Abuse:  No Significant Maternal Medications:  None Significant Maternal Lab Results: Group B Strep positive     OB History    Gravida  2   Para  1   Term      Preterm  1   AB      Living  1     SAB      TAB      Ectopic      Multiple      Live Births  1          Past Medical History:  Diagnosis Date  . Anemia   . Asthma   . Barrett esophagus   . Complication of anesthesia    Pt stopped breathing during EGD but did well with recent Gastric Bypass Surgery  . Depression   . GERD (gastroesophageal reflux disease)    H/O  . Headache    H/O MIGRAINES  . History of hiatal hernia    SMALL  . PCOS (polycystic ovarian syndrome)   . PONV (postoperative nausea and vomiting)   . Sleep apnea    PT IS NOT USING CPAP   Past Surgical History:  Procedure Laterality Date  . CESAREAN SECTION N/A 08/18/2017   Procedure: Primary CESAREAN SECTION;  Surgeon: 08/20/2017, MD;  Location: [facility] BIRTHING SUITES;  Service: Obstetrics;  Laterality: N/A;  EDD: 09/13/17 Allergy: Amoxicillin, Morphine,  Hydrocodone  . CHOLECYSTECTOMY    . DIAGNOSTIC LAPAROSCOPY    . EAR CYST EXCISION N/A 11/24/2014   Procedure: CYST REMOVAL;  Surgeon: 11/26/2014, MD;  Location: [facility] ORS;  Service: ENT;  Laterality: N/A;  upper lip  . ESOPHAGOGASTRODUODENOSCOPY    . ETHMOIDECTOMY Right 05/07/2018   Procedure: ETHMOIDECTOMY;  Surgeon: 13/12/2017, MD;  Location: [facility] ORS;  Service: ENT;  Laterality: Right;  . FRONTAL SINUS EXPLORATION Right 05/07/2018   Procedure: FRONTAL SINUS EXPLORATION;  Surgeon: 13/12/2017, MD;  Location: [facility] ORS;  Service: ENT;  Laterality: Right;  . GASTRIC BYPASS  March 2016  . IMAGE GUIDED SINUS SURGERY N/A 05/07/2018   Procedure: IMAGE GUIDED SINUS SURGERY;  Surgeon: 13/12/2017, MD;  Location: [facility] ORS;  Service: ENT;  Laterality: N/A;  . KNEE ARTHROSCOPY Right   . MAXILLARY ANTROSTOMY Bilateral 05/07/2018   Procedure: MAXILLARY ANTROSTOMY;  Surgeon: 13/12/2017, MD;  Location: [facility] ORS;  Service: ENT;  Laterality: Bilateral;  . NASAL SEPTOPLASTY W/ TURBINOPLASTY Bilateral 05/07/2018   Procedure: NASAL SEPTOPLASTY WITH TURBINATE REDUCTION;  Surgeon: 13/12/2017, MD;  Location: [facility] ORS;  Service: ENT;  Laterality: Bilateral;  . SPHENOIDECTOMY Right  05/07/2018   Procedure: Coralee Pesa;  Surgeon: Carloyn Manner, MD;  Location: [facility] ORS;  Service: ENT;  Laterality: Right;  . TEMPOROMANDIBULAR JOINT SURGERY    . WISDOM TOOTH EXTRACTION     Family History: family history includes Breast cancer in her paternal aunt; Heart disease in her maternal grandfather, maternal grandmother, paternal grandfather, and paternal grandmother. Social History:  reports that she has never smoked. She has never used smokeless tobacco. She reports current alcohol use. She reports that she does not use drugs.  Review of Systems - Negative except discomfort with pregnancy    Physical Exam: Vitals:   07/27/19 0600  BP: 123/77  Pulse: 94  Temp: 98.6 F (37 C)   Resp: 18  Height: 5\' 8"  (1.727 m)  Weight: 124 kg  SpO2: 96%  TempSrc: Oral  BMI (Calculated): 41.56    Gen: well appearing, no distress, anxious  Abd: gravid, NT, no RUQ pain LE: trace edema, equal bilaterally, non-tender  Prenatal labs: ABO, Rh: --/--/AB POS, AB POS Performed at [facility] [address], [place], [place] [address]  [phone] 240901/24 0849) Antibody: NEG (01/24 0849) Rubella: Immune (07/08 0000) RPR: NON REACTIVE (01/24 0849)  HBsAg: Negative (07/08 0000)  HIV: Non-reactive (07/08 0000)  GBS: Positive/-- (12/23 0000)  1 hr Glucola not done due to gastric bypass  Genetic screening nl PGD Anatomy US normal  CBC    Component Value Date/Time   WBC 8.9 07/25/2019 0849   RBC 4.56 07/25/2019 0849   HGB 13.8 07/25/2019 0849   HGB 13.6 01/03/2014 0000   HCT 40.9 07/25/2019 0849   HCT 42.1 01/03/2014 0000   PLT 230 07/25/2019 0849   PLT 323 01/03/2014 0000   MCV 89.7 07/25/2019 0849   MCV 85 01/03/2014 0000   MCH 30.3 07/25/2019 0849   MCHC 33.7 07/25/2019 0849   RDW 13.0 07/25/2019 0849   RDW 13.6 01/03/2014 0000   LYMPHSABS 2.6 09/23/2016 1543   LYMPHSABS 3.4 01/03/2014 0000   MONOABS 0.7 09/23/2016 1543   MONOABS 1.0 (H) 01/03/2014 0000   EOSABS 0.3 09/23/2016 1543   EOSABS 0.5 01/03/2014 0000   BASOSABS 0.1 09/23/2016 1543   BASOSABS 0.1 01/03/2014 0000     Assessment/Plan: 44 y.o. G2P0101 at [redacted]w[redacted]d - RCS with b/l salpingectomyy. R/B d/w pt - watch for worsening depression PP, consider SW. Con Celexa and Buspar - Anemia, consider IV iron PP - Assess endometrioma - GERD, cont Protonix.     Ala Dach 07/27/2019 7:15 AM

## 2019-07-27 ENCOUNTER — Other Ambulatory Visit: Payer: Self-pay

## 2019-07-27 ENCOUNTER — Encounter (HOSPITAL_COMMUNITY)
Admission: RE | Disposition: A | Discharge status: Home / Self Care | Payer: Self-pay | Source: Home / Self Care | Attending: Obstetrics

## 2019-07-27 ENCOUNTER — Inpatient Hospital Stay (HOSPITAL_COMMUNITY): Payer: BC Managed Care – PPO | Admitting: Anesthesiology

## 2019-07-27 ENCOUNTER — Encounter (HOSPITAL_COMMUNITY): Payer: Self-pay | Admitting: Obstetrics

## 2019-07-27 ENCOUNTER — Inpatient Hospital Stay (HOSPITAL_COMMUNITY)
Admission: RE | Admit: 2019-07-27 | Discharge: 2019-07-30 | DRG: 785 | Disposition: A | Discharge status: Home / Self Care | Payer: BC Managed Care – PPO | Attending: Obstetrics | Admitting: Obstetrics

## 2019-07-27 DIAGNOSIS — O403XX0 Polyhydramnios, third trimester, not applicable or unspecified: Secondary | ICD-10-CM | POA: Diagnosis present

## 2019-07-27 DIAGNOSIS — F329 Major depressive disorder, single episode, unspecified: Secondary | ICD-10-CM | POA: Diagnosis present

## 2019-07-27 DIAGNOSIS — O99824 Streptococcus B carrier state complicating childbirth: Secondary | ICD-10-CM | POA: Diagnosis not present

## 2019-07-27 DIAGNOSIS — O99844 Bariatric surgery status complicating childbirth: Secondary | ICD-10-CM | POA: Diagnosis not present

## 2019-07-27 DIAGNOSIS — Z98891 History of uterine scar from previous surgery: Secondary | ICD-10-CM

## 2019-07-27 DIAGNOSIS — Z3A39 39 weeks gestation of pregnancy: Secondary | ICD-10-CM | POA: Diagnosis not present

## 2019-07-27 DIAGNOSIS — N801 Endometriosis of ovary: Secondary | ICD-10-CM | POA: Diagnosis not present

## 2019-07-27 DIAGNOSIS — O99214 Obesity complicating childbirth: Secondary | ICD-10-CM | POA: Diagnosis present

## 2019-07-27 DIAGNOSIS — O34211 Maternal care for low transverse scar from previous cesarean delivery: Principal | ICD-10-CM | POA: Diagnosis present

## 2019-07-27 DIAGNOSIS — O9962 Diseases of the digestive system complicating childbirth: Secondary | ICD-10-CM | POA: Diagnosis not present

## 2019-07-27 DIAGNOSIS — O34219 Maternal care for unspecified type scar from previous cesarean delivery: Secondary | ICD-10-CM | POA: Diagnosis not present

## 2019-07-27 DIAGNOSIS — F419 Anxiety disorder, unspecified: Secondary | ICD-10-CM | POA: Diagnosis not present

## 2019-07-27 DIAGNOSIS — E669 Obesity, unspecified: Secondary | ICD-10-CM | POA: Diagnosis not present

## 2019-07-27 DIAGNOSIS — Z302 Encounter for sterilization: Secondary | ICD-10-CM

## 2019-07-27 DIAGNOSIS — K219 Gastro-esophageal reflux disease without esophagitis: Secondary | ICD-10-CM | POA: Diagnosis not present

## 2019-07-27 DIAGNOSIS — O99344 Other mental disorders complicating childbirth: Secondary | ICD-10-CM | POA: Diagnosis present

## 2019-07-27 DIAGNOSIS — D649 Anemia, unspecified: Secondary | ICD-10-CM | POA: Diagnosis present

## 2019-07-27 DIAGNOSIS — O9902 Anemia complicating childbirth: Secondary | ICD-10-CM | POA: Diagnosis not present

## 2019-07-27 DIAGNOSIS — Z515 Encounter for palliative care: Secondary | ICD-10-CM

## 2019-07-27 DIAGNOSIS — O3483 Maternal care for other abnormalities of pelvic organs, third trimester: Secondary | ICD-10-CM | POA: Diagnosis not present

## 2019-07-27 DIAGNOSIS — Z23 Encounter for immunization: Secondary | ICD-10-CM | POA: Diagnosis not present

## 2019-07-27 HISTORY — DX: History of uterine scar from previous surgery: Z98.891

## 2019-07-27 SURGERY — Surgical Case
Anesthesia: Spinal | Site: Abdomen | Laterality: Bilateral | Wound class: Clean Contaminated

## 2019-07-27 MED ORDER — DIPHENHYDRAMINE HCL 50 MG/ML IJ SOLN: 12.5000 mg | INTRAMUSCULAR | Status: DC | PRN

## 2019-07-27 MED ORDER — FENTANYL CITRATE (PF) 100 MCG/2ML IJ SOLN
INTRAMUSCULAR | Status: AC
  Filled 2019-07-27: qty 2

## 2019-07-27 MED ORDER — CITALOPRAM HYDROBROMIDE 20 MG PO TABS
40.0000 mg | ORAL_TABLET | Freq: Every day | ORAL | Status: DC
  Administered 2019-07-27 – 2019-07-29 (×3): 40 mg via ORAL
  Filled 2019-07-27 (×3): qty 2

## 2019-07-27 MED ORDER — SIMETHICONE 80 MG PO CHEW
80.0000 mg | CHEWABLE_TABLET | ORAL | Status: DC | PRN
  Administered 2019-07-29: 02:00:00 80 mg via ORAL
  Filled 2019-07-27: qty 1

## 2019-07-27 MED ORDER — DIPHENHYDRAMINE HCL 25 MG PO CAPS: 25.0000 mg | ORAL_CAPSULE | ORAL | Status: DC | PRN

## 2019-07-27 MED ORDER — NALBUPHINE HCL 10 MG/ML IJ SOLN
INTRAMUSCULAR | Status: AC
  Filled 2019-07-27: qty 1

## 2019-07-27 MED ORDER — MEPERIDINE HCL 25 MG/ML IJ SOLN
INTRAMUSCULAR | Status: AC
  Filled 2019-07-27: qty 1

## 2019-07-27 MED ORDER — STERILE WATER FOR IRRIGATION IR SOLN
Status: DC | PRN
  Administered 2019-07-27: 1000 mL

## 2019-07-27 MED ORDER — DEXAMETHASONE SODIUM PHOSPHATE 4 MG/ML IJ SOLN
INTRAMUSCULAR | Status: DC | PRN
  Administered 2019-07-27: 10 mg via INTRAVENOUS

## 2019-07-27 MED ORDER — PRENATAL MULTIVITAMIN CH
1.0000 | ORAL_TABLET | Freq: Every day | ORAL | Status: DC
  Administered 2019-07-27 – 2019-07-29 (×2): 1 via ORAL
  Filled 2019-07-27 (×2): qty 1

## 2019-07-27 MED ORDER — OXYTOCIN 40 UNITS IN NORMAL SALINE INFUSION - SIMPLE MED: 2.5000 [IU]/h | INTRAVENOUS | Status: AC

## 2019-07-27 MED ORDER — NALBUPHINE HCL 10 MG/ML IJ SOLN: 5.0000 mg | INTRAMUSCULAR | Status: DC | PRN

## 2019-07-27 MED ORDER — PANTOPRAZOLE SODIUM 40 MG PO TBEC
40.0000 mg | DELAYED_RELEASE_TABLET | Freq: Every day | ORAL | Status: DC
  Administered 2019-07-27 – 2019-07-30 (×4): 40 mg via ORAL
  Filled 2019-07-27 (×4): qty 1

## 2019-07-27 MED ORDER — DEXTROSE 5 % IV SOLN
INTRAVENOUS | Status: AC
  Filled 2019-07-27: qty 3000

## 2019-07-27 MED ORDER — ONDANSETRON HCL 4 MG/2ML IJ SOLN
INTRAMUSCULAR | Status: AC
  Filled 2019-07-27: qty 2

## 2019-07-27 MED ORDER — MORPHINE SULFATE (PF) 0.5 MG/ML IJ SOLN
INTRAMUSCULAR | Status: AC
  Filled 2019-07-27: qty 10

## 2019-07-27 MED ORDER — ENOXAPARIN SODIUM 60 MG/0.6ML ~~LOC~~ SOLN
0.5000 mg/kg | SUBCUTANEOUS | Status: DC
  Administered 2019-07-28 – 2019-07-30 (×3): 60 mg via SUBCUTANEOUS
  Filled 2019-07-27 (×3): qty 0.6

## 2019-07-27 MED ORDER — WITCH HAZEL-GLYCERIN EX PADS: 1.0000 "application " | MEDICATED_PAD | CUTANEOUS | Status: DC | PRN

## 2019-07-27 MED ORDER — COCONUT OIL OIL: 1.0000 "application " | TOPICAL_OIL | Status: DC | PRN

## 2019-07-27 MED ORDER — EPHEDRINE SULFATE 50 MG/ML IJ SOLN
INTRAMUSCULAR | Status: DC | PRN
  Administered 2019-07-27 (×3): 10 mg via INTRAVENOUS

## 2019-07-27 MED ORDER — FENTANYL CITRATE (PF) 100 MCG/2ML IJ SOLN
INTRAMUSCULAR | Status: DC | PRN
  Administered 2019-07-27: 85 ug via INTRAVENOUS
  Administered 2019-07-27: 100 ug via INTRAVENOUS

## 2019-07-27 MED ORDER — DIBUCAINE (PERIANAL) 1 % EX OINT: 1.0000 "application " | TOPICAL_OINTMENT | CUTANEOUS | Status: DC | PRN

## 2019-07-27 MED ORDER — ONDANSETRON HCL 4 MG/2ML IJ SOLN: 4.0000 mg | Freq: Three times a day (TID) | INTRAMUSCULAR | Status: DC | PRN

## 2019-07-27 MED ORDER — LACTATED RINGERS IV SOLN: INTRAVENOUS | Status: DC

## 2019-07-27 MED ORDER — OXYTOCIN 40 UNITS IN NORMAL SALINE INFUSION - SIMPLE MED
INTRAVENOUS | Status: DC | PRN
  Administered 2019-07-27: 40 mL via INTRAVENOUS

## 2019-07-27 MED ORDER — TETANUS-DIPHTH-ACELL PERTUSSIS 5-2.5-18.5 LF-MCG/0.5 IM SUSP: 0.5000 mL | Freq: Once | INTRAMUSCULAR | Status: DC

## 2019-07-27 MED ORDER — SODIUM CHLORIDE 0.9% FLUSH: 3.0000 mL | INTRAVENOUS | Status: DC | PRN

## 2019-07-27 MED ORDER — ZOLPIDEM TARTRATE 5 MG PO TABS: 5.0000 mg | ORAL_TABLET | Freq: Every evening | ORAL | Status: DC | PRN

## 2019-07-27 MED ORDER — KETOROLAC TROMETHAMINE 30 MG/ML IJ SOLN
INTRAMUSCULAR | Status: AC
  Filled 2019-07-27: qty 1

## 2019-07-27 MED ORDER — MEPERIDINE HCL 25 MG/ML IJ SOLN
INTRAMUSCULAR | Status: DC | PRN
  Administered 2019-07-27: 25 mg via INTRAVENOUS

## 2019-07-27 MED ORDER — ACETAMINOPHEN 500 MG PO TABS
1000.0000 mg | ORAL_TABLET | Freq: Four times a day (QID) | ORAL | Status: AC
  Administered 2019-07-27 – 2019-07-28 (×3): 1000 mg via ORAL
  Filled 2019-07-27 (×4): qty 2

## 2019-07-27 MED ORDER — EPHEDRINE 5 MG/ML INJ
INTRAVENOUS | Status: AC
  Filled 2019-07-27: qty 10

## 2019-07-27 MED ORDER — DEXAMETHASONE SODIUM PHOSPHATE 10 MG/ML IJ SOLN
INTRAMUSCULAR | Status: AC
  Filled 2019-07-27: qty 1

## 2019-07-27 MED ORDER — NALOXONE HCL 0.4 MG/ML IJ SOLN: 0.4000 mg | INTRAMUSCULAR | Status: DC | PRN

## 2019-07-27 MED ORDER — MORPHINE SULFATE (PF) 0.5 MG/ML IJ SOLN
INTRAMUSCULAR | Status: DC | PRN
  Administered 2019-07-27: 150 ug via EPIDURAL

## 2019-07-27 MED ORDER — PHENYLEPHRINE HCL-NACL 20-0.9 MG/250ML-% IV SOLN
INTRAVENOUS | Status: AC
  Filled 2019-07-27: qty 250

## 2019-07-27 MED ORDER — ACETAMINOPHEN 10 MG/ML IV SOLN
1000.0000 mg | Freq: Once | INTRAVENOUS | Status: AC
  Administered 2019-07-27: 09:00:00 1000 mg via INTRAVENOUS

## 2019-07-27 MED ORDER — ONDANSETRON HCL 4 MG/2ML IJ SOLN
INTRAMUSCULAR | Status: DC | PRN
  Administered 2019-07-27: 4 mg via INTRAVENOUS

## 2019-07-27 MED ORDER — SENNOSIDES-DOCUSATE SODIUM 8.6-50 MG PO TABS
2.0000 | ORAL_TABLET | ORAL | Status: DC
  Administered 2019-07-28 – 2019-07-29 (×2): 2 via ORAL
  Filled 2019-07-27 (×2): qty 2

## 2019-07-27 MED ORDER — SODIUM CHLORIDE 0.9 % IV SOLN: INTRAVENOUS | Status: DC | PRN

## 2019-07-27 MED ORDER — BUPIVACAINE IN DEXTROSE 0.75-8.25 % IT SOLN
INTRATHECAL | Status: DC | PRN
  Administered 2019-07-27: 1.6 mg via INTRATHECAL

## 2019-07-27 MED ORDER — IBUPROFEN 800 MG PO TABS
800.0000 mg | ORAL_TABLET | Freq: Three times a day (TID) | ORAL | Status: DC
  Administered 2019-07-28: 05:00:00 800 mg via ORAL
  Filled 2019-07-27: qty 1

## 2019-07-27 MED ORDER — SCOPOLAMINE 1 MG/3DAYS TD PT72: 1.0000 | MEDICATED_PATCH | Freq: Once | TRANSDERMAL | Status: DC

## 2019-07-27 MED ORDER — NALBUPHINE HCL 10 MG/ML IJ SOLN
5.0000 mg | Freq: Once | INTRAMUSCULAR | Status: AC | PRN
  Administered 2019-07-27: 09:00:00 5 mg via INTRAVENOUS

## 2019-07-27 MED ORDER — OXYTOCIN 40 UNITS IN NORMAL SALINE INFUSION - SIMPLE MED
INTRAVENOUS | Status: AC
  Filled 2019-07-27: qty 1000

## 2019-07-27 MED ORDER — FENTANYL CITRATE (PF) 100 MCG/2ML IJ SOLN
INTRAMUSCULAR | Status: DC | PRN
  Administered 2019-07-27: 15 ug via INTRATHECAL

## 2019-07-27 MED ORDER — LACTATED RINGERS IV SOLN
INTRAVENOUS | Status: DC
  Administered 2019-07-27: 10:00:00 125 mL/h via INTRAVENOUS

## 2019-07-27 MED ORDER — BUSPIRONE HCL 15 MG PO TABS
7.5000 mg | ORAL_TABLET | Freq: Every day | ORAL | Status: DC
  Administered 2019-07-27 – 2019-07-29 (×3): 7.5 mg via ORAL
  Filled 2019-07-27 (×5): qty 1

## 2019-07-27 MED ORDER — SIMETHICONE 80 MG PO CHEW
80.0000 mg | CHEWABLE_TABLET | Freq: Three times a day (TID) | ORAL | Status: DC
  Administered 2019-07-27 – 2019-07-30 (×8): 80 mg via ORAL
  Filled 2019-07-27 (×8): qty 1

## 2019-07-27 MED ORDER — DEXTROSE 5 % IV SOLN
3.0000 g | INTRAVENOUS | Status: AC
  Administered 2019-07-27: 3 g via INTRAVENOUS

## 2019-07-27 MED ORDER — PHENYLEPHRINE HCL-NACL 10-0.9 MG/250ML-% IV SOLN
INTRAVENOUS | Status: DC | PRN
  Administered 2019-07-27: 60 ug/min via INTRAVENOUS

## 2019-07-27 MED ORDER — ACETAMINOPHEN 10 MG/ML IV SOLN
INTRAVENOUS | Status: AC
  Filled 2019-07-27: qty 100

## 2019-07-27 MED ORDER — FENTANYL CITRATE (PF) 100 MCG/2ML IJ SOLN: 25.0000 ug | INTRAMUSCULAR | Status: DC | PRN

## 2019-07-27 MED ORDER — NALBUPHINE HCL 10 MG/ML IJ SOLN: 5.0000 mg | Freq: Once | INTRAMUSCULAR | Status: AC | PRN

## 2019-07-27 MED ORDER — NALOXONE HCL 4 MG/10ML IJ SOLN
1.0000 ug/kg/h | INTRAVENOUS | Status: DC | PRN
  Filled 2019-07-27: qty 5

## 2019-07-27 MED ORDER — DIPHENHYDRAMINE HCL 25 MG PO CAPS: 25.0000 mg | ORAL_CAPSULE | Freq: Four times a day (QID) | ORAL | Status: DC | PRN

## 2019-07-27 MED ORDER — SIMETHICONE 80 MG PO CHEW
80.0000 mg | CHEWABLE_TABLET | ORAL | Status: DC
  Administered 2019-07-28 – 2019-07-29 (×2): 80 mg via ORAL
  Filled 2019-07-27 (×2): qty 1

## 2019-07-27 MED ORDER — SODIUM CHLORIDE 0.9 % IR SOLN
Status: DC | PRN
  Administered 2019-07-27: 1000 mL

## 2019-07-27 MED ORDER — MENTHOL 3 MG MT LOZG: 1.0000 | LOZENGE | OROMUCOSAL | Status: DC | PRN

## 2019-07-27 MED ORDER — KETOROLAC TROMETHAMINE 30 MG/ML IJ SOLN
30.0000 mg | Freq: Once | INTRAMUSCULAR | Status: AC
  Administered 2019-07-27: 30 mg via INTRAVENOUS

## 2019-07-27 SURGICAL SUPPLY — 27 items
CLAMP CORD UMBIL (MISCELLANEOUS) ×2 IMPLANT
CLOTH BEACON ORANGE TIMEOUT ST (SAFETY) ×2 IMPLANT
DRSG OPSITE POSTOP 4X10 (GAUZE/BANDAGES/DRESSINGS) ×2 IMPLANT
ELECT REM PT RETURN 9FT ADLT (ELECTROSURGICAL) ×2
ELECTRODE REM PT RTRN 9FT ADLT (ELECTROSURGICAL) ×1 IMPLANT
GLOVE BIO SURGEON STRL SZ 6.5 (GLOVE) ×2 IMPLANT
GLOVE BIOGEL PI IND STRL 7.0 (GLOVE) ×2 IMPLANT
GLOVE BIOGEL PI INDICATOR 7.0 (GLOVE) ×2
GOWN STRL REUS W/TWL LRG LVL3 (GOWN DISPOSABLE) ×4 IMPLANT
KIT PREVENA INCISION MGT20CM45 (CANNISTER) ×2 IMPLANT
NS IRRIG 1000ML POUR BTL (IV SOLUTION) ×2 IMPLANT
PACK C SECTION WH (CUSTOM PROCEDURE TRAY) ×2 IMPLANT
PAD OB MATERNITY 4.3X12.25 (PERSONAL CARE ITEMS) ×2 IMPLANT
PENCIL SMOKE EVAC W/HOLSTER (ELECTROSURGICAL) ×2 IMPLANT
SUT MON AB 4-0 PS1 27 (SUTURE) ×2 IMPLANT
SUT PLAIN 0 NONE (SUTURE) ×2 IMPLANT
SUT PLAIN 2 0 XLH (SUTURE) ×2 IMPLANT
SUT VIC AB 0 CT1 36 (SUTURE) ×4 IMPLANT
SUT VIC AB 0 CTX 36 (SUTURE) ×2
SUT VIC AB 0 CTX36XBRD ANBCTRL (SUTURE) ×2 IMPLANT
SUT VIC AB 2-0 CT1 27 (SUTURE) ×1
SUT VIC AB 2-0 CT1 TAPERPNT 27 (SUTURE) ×1 IMPLANT
SUT VIC AB 4-0 KS 27 (SUTURE) ×2 IMPLANT
TOWEL OR 17X24 6PK STRL BLUE (TOWEL DISPOSABLE) ×2 IMPLANT
TRAY FOLEY W/BAG SLVR 14FR LF (SET/KITS/TRAYS/PACK) ×2 IMPLANT
WATER STERILE IRR 1000ML POUR (IV SOLUTION) ×2 IMPLANT
YANKAUER SUCT BULB TIP NO VENT (SUCTIONS) ×2 IMPLANT

## 2019-07-27 NOTE — Op Note (Signed)
07/27/2019  8:34 AM  PATIENT:  Kimberly Santiago  44 y.o. female  PRE-OPERATIVE DIAGNOSIS:  Previous Cesarean Section, Desires Sterilization, right endometrioma  POST-OPERATIVE DIAGNOSIS:  Previous Cesarean Section, Desires Sterilization, right endometrioma  PROCEDURE:  Procedure(s) with comments: Repeat CESAREAN SECTION WITH BILATERAL TUBAL LIGATION (Bilateral) - EDD: 07/31/19  Low-transverse cesarean section with 2 layer closure Bilateral tubal ligation via [place]  SURGEON:  Surgeon(s) and Role:    * Aloha Gell, MD - Primary  PHYSICIAN ASSISTANT:   ASSISTANTS: Derrell Lolling, CNM  ANESTHESIA:   spinal  EBL:  800 mL  Per q. BL 256  BLOOD ADMINISTERED:none  DRAINS: Urinary Catheter (Foley)   LOCAL MEDICATIONS USED:  NONE  SPECIMEN:  Source of Specimen:  Placenta to labor and delivery, bilateral tubal segments to pathology  DISPOSITION OF SPECIMEN:  PATHOLOGY  COUNTS:  YES  TOURNIQUET:  * No tourniquets in log *  DICTATION: .Note written in EPIC  PLAN OF CARE: Admit to inpatient   PATIENT DISPOSITION:  PACU - hemodynamically stable.   Delay start of Pharmacological VTE agent (>24hrs) due to surgical blood loss or risk of bleeding: yes     Findings:  @BABYSEXEBC @ infant,  APGAR (1 MIN):   APGAR (5 MINS):   APGAR (10 MINS):   Normal uterus, tubes and left ovary, right ovary with 2 cm of endometriotic cyst deep in the ovarian tissue, normal placenta. 3VC, clear amniotic fluid  EBL: 800 cc Antibiotics: 3 g Ancef Complications: none  Indications: This is a 44 y.o. year-old, G2 P1 at [redacted]w[redacted]d admitted for repeat cesarean section tubal ligation. Risks benefits and alternatives of the procedure were discussed with the patient who agreed to proceed  Procedure:  After informed consent was obtained the patient was taken to the operating room where spinal anesthesia was initiated.  She was prepped and draped in the normal sterile fashion in dorsal supine position with a  leftward tilt.  A foley catheter was in place.  A Pfannenstiel skin incision was made 2 cm above the pubic symphysis in the midline with the scalpel, over the old Pfannenstiel skin incision dissection was carried down with the Bovie cautery until the fascia was reached. The fascia was incised in the midline. The incision was extended laterally with the Mayo scissors. The inferior aspect of the fascial incision was grasped with the Coker clamps, elevated up and the underlying rectus muscles were dissected off sharply. The superior aspect of the fascial incision was grasped with the Coker clamps elevated up and the underlying rectus muscles were dissected off sharply.  The peritoneum was entered sharply.  The bladder was seen quite high.  the peritoneal incision was extended superiorly and inferiorly with good visualization of the bladder. The bladder blade was inserted and palpation was done to assess the fetal position and the location of the uterine vessels. The lower segment of the uterus was incised sharply with the scalpel and extended  bluntly in the cephalo-caudal fashion. The infant was grasped, brought to the incision,  rotated and the infant was delivered with fundal pressure. The nose and mouth were bulb suctioned. The cord was clamped and cut after 1 minute delay. The infant was handed off to the waiting pediatrician. The placenta was expressed. The uterus was exteriorized. The uterus was cleared of all clots and debris. The uterine incision was repaired with 0 Vicryl in a running locked fashion.  A right inferior extension was noted.  2 additional figure-of-eight sutures were placed for hemostasis.  This area was watched closely with the uterus both in the delivered and the in situ position.  A second layer of the same suture was used in an imbricating fashion to obtain excellent hemostasis.  An additional figure-of-eight was placed in the midpoint of the incision.   tubal ligation The tubes and  ovaries were evaluated, pt again confirmed her desire for a tubal ligation. The right tube was visualized out to its fimbriated end. A Babcock clamp was used to elevated the mid-isthmic portion of the tube. A free tie of plan gut suture was used to tie off a 2 cm knuckle of tube. A second free tie was placed just below the first. A window was created in the mesosalpynx and the the knuckle of tube was transected. An identical procedure was carried out on the contralateral side. After the uterus was returned to the abdomen, using retraction, the transection sites were visualized and found to be hemostatic.     The uterus was then returned to the abdomen, the gutters were cleared of all clots and debris.  Irrigation with warm normal saline was done.  The uterine incision was reinspected and found to be hemostatic.  The tubal sites were found to be hemostatic.  the peritoneum was grasped and closed with 2-0 Vicryl in a running fashion. The cut muscle edges and the underside of the fascia were inspected and found to be hemostatic. The fascia was closed with 0 Vicryl in 2 halves. The subcutaneous tissue was irrigated. Scarpa's layer was closed with a 2-0 plain gut suture. The skin was closed with a 4-0 Monocryl in a single layer. The patient tolerated the procedure well. Sponge lap and needle counts were correct x3 and patient was taken to the recovery room in a stable condition.  Lendon Colonel 07/27/2019 8:37 AM

## 2019-07-27 NOTE — Transfer of Care (Signed)
Immediate Anesthesia Transfer of Care Note  Patient: Kimberly Santiago  Procedure(s) Performed: Repeat CESAREAN SECTION WITH BILATERAL TUBAL LIGATION (Bilateral Abdomen)  Patient Location: PACU  Anesthesia Type:Spinal  Level of Consciousness: awake, alert  and oriented  Airway & Oxygen Therapy: Patient Spontanous Breathing  Post-op Assessment: Report given to RN and Post -op Vital signs reviewed and stable  Post vital signs: Reviewed and stable  Last Vitals:  Vitals Value Taken Time  BP    Temp    Pulse 80 07/27/19 0850  Resp    SpO2 96 % 07/27/19 0850  Vitals shown include unvalidated device data.  Last Pain:  Vitals:   07/27/19 0600  TempSrc: Oral  PainSc: 0-No pain         Complications: No apparent anesthesia complications

## 2019-07-27 NOTE — Anesthesia Postprocedure Evaluation (Signed)
Anesthesia Post Note  Patient: Kimberly Santiago  Procedure(s) Performed: Repeat CESAREAN SECTION WITH BILATERAL TUBAL LIGATION (Bilateral Abdomen)     Patient location during evaluation: PACU Anesthesia Type: Spinal Level of consciousness: awake and alert Pain management: pain level controlled Vital Signs Assessment: post-procedure vital signs reviewed and stable Respiratory status: spontaneous breathing and respiratory function stable Cardiovascular status: blood pressure returned to baseline and stable Postop Assessment: spinal receding Anesthetic complications: no    Last Vitals:  Vitals:   07/27/19 0945 07/27/19 1000  BP: 115/74 126/77  Pulse: 79 68  Resp: 16 16  Temp:    SpO2: 100% 95%    Last Pain:  Vitals:   07/27/19 1000  TempSrc:   PainSc: 9    Pain Goal:                Epidural/Spinal Function Cutaneous sensation: Able to Wiggle Toes (07/27/19 0945), Patient able to flex knees: Yes (07/27/19 0945), Patient able to lift hips off bed: Yes (07/27/19 0945), Back pain beyond tenderness at insertion site: No (07/27/19 0945), Progressively worsening motor and/or sensory loss: No (07/27/19 0945), Bowel and/or bladder incontinence post epidural: No (07/27/19 0945)  Kennieth Rad

## 2019-07-27 NOTE — Lactation Note (Signed)
This note was copied from a baby's chart. Lactation Consultation Note  Patient Name: Kimberly Santiago [id number] Date: 07/27/2019 Reason for consult: Initial assessment;NICU baby;Term  Risk factors for low milk supply: AMA, history of infertility, IVF pregnancy, history of gastric bypass, obesity, C/Section  LC in to visit with P2 [name].  Baby 5 hrs old.  Mom latched baby for 5 minutes in the NICU.  Mom is thrilled as her first child never latched to the breast, but she exclusively pumped and her milk supply wasn't adequate. Mom reports + breast changes with this pregnancy.    Set up DEBP and assisted Mom with first pumping.  24 mm flanges changed to 21 mm as Mom's nipple wasn't moving well, and areola was being pulled into shield.  After about 10 mins, nipple moving more into flange and looked somewhat compressed.  Mom to use 24 mm at next pumping.  Mom denies any discomfort.   Educated FOB and Mom on importance of disassembling pump parts, washing, rinsing and air drying in separate bin provided for them.    Reviewed breast massage and hand expression.  A small drop of colostrum noted, Mom winced with hand expression.  Colostrum containers given and encouraged Mom to do hand expression to comfort.  Mom encouraged to ask for baby to be STS as much as possible.  Mom to pump both breasts on initiation setting every 2-3 hrs during day, and every 4 hrs at night.  Mom has a Spectra 2 pump at home.   Lactation brochure given to Mom, along with NICU booklet. Mom encouraged to ask for help prn.  Lactation Tools Discussed/Used Tools: Pump;Flanges Flange Size: 21;24 Breast pump type: Double-Electric Breast Pump WIC Program: No Pump Review: Setup, frequency, and cleaning;Milk Storage Initiated by:: Erby Pian RN IBCLC Date initiated:: 07/27/19   Consult Status Consult Status: Follow-up Date: 07/28/19 Follow-up type: In-patient    Judee Clara 07/27/2019, 12:54  PM

## 2019-07-27 NOTE — Consult Note (Signed)
Neonatology Note:   Attendance at C-section:    I was asked by Dr. Ernestina Penna to attend this repeat C/S at term. The mother is a G2P1102, GBS + with good prenatal care  IVF pregnancy complicated by obesity s/p gastric bypass, and polyhydramnios, anxiety and depression.  Some contractions PTD.  ROM 0h 49m prior to delivery, fluid light meconium with nuchal. Infant vigorous with good spontaneous cry and tone. Needed minimal bulb suctioning. However, poor color change.  Lungs coarse and equal bilateral.  SaO2 in 60-70s. CPAP initiated with gradual response. Fio2 weaned accordingly.  To RA by 20 minutes of life.  VSS without WOB and well saturated by pulse ox.   Ap 8/8.  Family updated. Infant placed skin to skin.  To CN to care of Pediatrician.  Dineen Kid Leary Roca, MD

## 2019-07-27 NOTE — Anesthesia Preprocedure Evaluation (Addendum)
Anesthesia Evaluation  Patient identified by MRN, date of birth, ID band Patient awake    Reviewed: Allergy & Precautions, NPO status , Patient's Chart, lab work & pertinent test results  Airway Mallampati: II  TM Distance: >3 FB Neck ROM: Full    Dental  (+) Dental Advisory Given   Pulmonary asthma , sleep apnea ,    breath sounds clear to auscultation       Cardiovascular negative cardio ROS   Rhythm:Regular Rate:Normal     Neuro/Psych negative neurological ROS     GI/Hepatic Neg liver ROS, hiatal hernia, GERD  Medicated,  Endo/Other  negative endocrine ROS  Renal/GU negative Renal ROS     Musculoskeletal   Abdominal   Peds  Hematology negative hematology ROS (+)   Anesthesia Other Findings   Reproductive/Obstetrics (+) Pregnancy                             Lab Results  Component Value Date   WBC 8.9 07/25/2019   HGB 13.8 07/25/2019   HCT 40.9 07/25/2019   MCV 89.7 07/25/2019   PLT 230 07/25/2019   Lab Results  Component Value Date   CREATININE 0.76 09/23/2016   BUN 15 09/23/2016   NA 138 09/23/2016   K 4.2 09/23/2016   CL 105 09/23/2016   CO2 26 09/23/2016    Anesthesia Physical Anesthesia Plan  ASA: III  Anesthesia Plan: Spinal   Post-op Pain Management:    Induction:   PONV Risk Score and Plan: 2 and Ondansetron, Dexamethasone and Treatment may vary due to age or medical condition  Airway Management Planned: Natural Airway  Additional Equipment:   Intra-op Plan:   Post-operative Plan:   Informed Consent: I have reviewed the patients History and Physical, chart, labs and discussed the procedure including the risks, benefits and alternatives for the proposed anesthesia with the patient or authorized representative who has indicated his/her understanding and acceptance.       Plan Discussed with: CRNA and Surgeon  Anesthesia Plan Comments:          Anesthesia Quick Evaluation

## 2019-07-27 NOTE — Anesthesia Procedure Notes (Signed)
Spinal  Patient location during procedure: OR Start time: 07/27/2019 7:20 AM End time: 07/27/2019 7:24 AM Staffing Performed: anesthesiologist  Anesthesiologist: Marcene Duos, MD Preanesthetic Checklist Completed: patient identified, IV checked, site marked, risks and benefits discussed, surgical consent, monitors and equipment checked, pre-op evaluation and timeout performed Spinal Block Patient position: sitting Prep: DuraPrep Patient monitoring: heart rate, cardiac monitor, continuous pulse ox and blood pressure Approach: midline Location: L4-5 Injection technique: single-shot Needle Needle type: Pencan  Needle gauge: 24 G Needle length: 9 cm Assessment Sensory level: T4

## 2019-07-27 NOTE — Brief Op Note (Signed)
07/27/2019  8:34 AM  PATIENT:  Kimberly Santiago  44 y.o. female  PRE-OPERATIVE DIAGNOSIS:  Previous Cesarean Section, Desires Sterilization, right endometrioma  POST-OPERATIVE DIAGNOSIS:  Previous Cesarean Section, Desires Sterilization, right endometrioma  PROCEDURE:  Procedure(s) with comments: Repeat CESAREAN SECTION WITH BILATERAL TUBAL LIGATION (Bilateral) - EDD: 07/31/19  Low-transverse cesarean section with 2 layer closure Bilateral tubal ligation via [place]  SURGEON:  Surgeon(s) and Role:    * Noland Fordyce, MD - Primary  PHYSICIAN ASSISTANT:   ASSISTANTS: Arlan Organ, CNM  ANESTHESIA:   spinal  EBL:  800 mL  Per q. BL 256  BLOOD ADMINISTERED:none  DRAINS: Urinary Catheter (Foley)   LOCAL MEDICATIONS USED:  NONE  SPECIMEN:  Source of Specimen:  Placenta to labor and delivery, bilateral tubal segments to pathology  DISPOSITION OF SPECIMEN:  PATHOLOGY  COUNTS:  YES  TOURNIQUET:  * No tourniquets in log *  DICTATION: .Note written in EPIC  PLAN OF CARE: Admit to inpatient   PATIENT DISPOSITION:  PACU - hemodynamically stable.   Delay start of Pharmacological VTE agent (>24hrs) due to surgical blood loss or risk of bleeding: yes

## 2019-07-28 LAB — CBC
HCT: 33.8 % — ABNORMAL LOW (ref 36.0–46.0)
Hemoglobin: 11.3 g/dL — ABNORMAL LOW (ref 12.0–15.0)
MCH: 30 pg (ref 26.0–34.0)
MCHC: 33.4 g/dL (ref 30.0–36.0)
MCV: 89.7 fL (ref 80.0–100.0)
Platelets: 204 10*3/uL (ref 150–400)
RBC: 3.77 MIL/uL — ABNORMAL LOW (ref 3.87–5.11)
RDW: 13 % (ref 11.5–15.5)
WBC: 15 10*3/uL — ABNORMAL HIGH (ref 4.0–10.5)
nRBC: 0 % (ref 0.0–0.2)

## 2019-07-28 LAB — BIRTH TISSUE RECOVERY COLLECTION (PLACENTA DONATION)

## 2019-07-28 LAB — SURGICAL PATHOLOGY

## 2019-07-28 MED ORDER — ACETAMINOPHEN 500 MG PO TABS
1000.0000 mg | ORAL_TABLET | Freq: Four times a day (QID) | ORAL | Status: DC | PRN
  Administered 2019-07-28 – 2019-07-30 (×8): 1000 mg via ORAL
  Filled 2019-07-28 (×7): qty 2

## 2019-07-28 MED ORDER — OXYCODONE HCL 5 MG PO TABS
5.0000 mg | ORAL_TABLET | ORAL | Status: DC | PRN
  Administered 2019-07-28 – 2019-07-29 (×4): 5 mg via ORAL
  Filled 2019-07-28 (×4): qty 1

## 2019-07-28 NOTE — Lactation Note (Signed)
This note was copied from a baby's chart. Lactation Consultation Note  Patient Name: Kimberly Santiago [id number] Date: 07/28/2019 Reason for consult: Follow-up assessment;NICU baby  Pecola Leisure is 20 hours old  Visited mom in her room 36  Per mom breast fed once around 1 am and baby breast fed well.  Did not pump during the night.  LC reviewed supply and demand/ importance of pumping around the clock.  Made suggestions of applying moist heat prior to hand expressing, then pumping and hand express afterwards to increase the EBM yield.  Per mom plans to pump after breakfast.  LC also recommended if the baby is sluggish at the breast to start. Ask the NICU RN if the baby could have a small appetizer of donor milk and then work on latching.  The baby may have gotten use to the quick flow and until the milk volume increases the baby may needs some help to get started.    Maternal Data    Feeding Feeding Type: Donor Breast Milk Nipple Type: Slow - flow  LATCH Score                   Interventions Interventions: Breast feeding basics reviewed;DEBP  Lactation Tools Discussed/Used Tools: Pump Flange Size: 24;21 Breast pump type: Double-Electric Breast Pump   Consult Status Consult Status: Follow-up Date: 07/29/19 Follow-up type: In-patient    Kimberly Santiago Kimberly Santiago 07/28/2019, 9:32 AM

## 2019-07-28 NOTE — Progress Notes (Signed)
Subjective: Postpartum Day 1 Repeat elective Cesarean Delivery, POD #1.  Girl. NICU transfer to Tachypnea   Patient reports + flatus and no problems voiding. Pain mangmt reviewed since afraid to take Ibuprofen for h/o gastric bypass. Ambulating, no SOB/ CP  Objective: Vital signs in last 24 hours: Temp:  [97.7 F (36.5 C)-98.1 F (36.7 C)] 98.1 F (36.7 C) (01/27 0510) Pulse Rate:  [57-72] 61 (01/27 0510) Resp:  [16-18] 18 (01/27 0510) BP: (102-121)/(55-76) 102/55 (01/27 0510) SpO2:  [97 %-98 %] 98 % (01/26 1700)  Physical Exam:  General: alert and cooperative,> maternal obesity Lungs CTA CV RRR Lochia: appropriate Uterine Fundus: firm Incision: Pravena in place and working well, no erythema/ seroma palpated. Pannus without infection or swelling  DVT Evaluation: No evidence of DVT seen on physical exam.  CBC Latest Ref Rng & Units 07/28/2019 07/25/2019   WBC 4.0 - 10.5 K/uL 15.0(H) 8.9   Hemoglobin 12.0 - 15.0 g/dL 11.3(L) 13.8   Hematocrit 36.0 - 46.0 % 33.8(L) 40.9   Platelets 150 - 400 K/uL 204 230    Rub Imm Rh positive   Assessment/Plan: Status post Cesarean section. Doing well postoperatively. continue to ambulate, post-op and PP care and warning s/s reviewed  Continue current care. Pain mnmgt with Tylenol and Oxycodone IR Anxiety/depression- stable, continue current meds. NICU baby. She had experienced that with 1st boy who was 36 wk delivery, so less anxious.   Robley Fries 07/28/2019, 12:17 PM

## 2019-07-29 NOTE — Progress Notes (Signed)
Patient ID: Kimberly Santiago, female   DOB: 1976/06/06, 44 y.o.   MRN: [id number] Subjective: POD# 2 Live born female  Birth Weight: 7 lb 9.7 oz (3450 g) APGAR: 8, 8  Newborn Delivery   Birth date/time: 07/27/2019 07:52:00 Delivery type: C-Section, Low Transverse Trial of labor: No C-section categorization: Repeat     Baby name: Glenis Smoker Delivering provider: Noland Fordyce   Feeding: breast  Pain control at delivery: Spinal   Reports feeling well but tired.  Patient reports tolerating PO.   Breast symptoms:+ colostrum, pumping, baby returned from NICU but poor latch. Working w/ LC Pain controlled with PO meds Denies HA/SOB/C/P/N/V/dizziness. Flatus present. She reports vaginal bleeding as normal, without clots.  She is ambulating, urinating without difficulty.     Objective:   VS:    Vitals:   07/28/19 0510 07/28/19 1930 07/28/19 2128 07/29/19 0615  BP: (!) 102/55 113/73 124/70 129/81  Pulse: 61 73 88 (!) 58  Resp: 18 17 16 15   Temp: 98.1 F (36.7 C) 97.9 F (36.6 C) 98.1 F (36.7 C) 98.2 F (36.8 C)  TempSrc: Oral Oral Oral Oral  SpO2:  100% 97% 100%  Weight:      Height:        No intake or output data in the 24 hours ending 07/29/19 0843      Recent Labs    07/28/19 0443  WBC 15.0*  HGB 11.3*  HCT 33.8*  PLT 204     Blood type: --/--/AB POS, AB POS Performed at [facility], [address], [place], [place]  [phone] (90/30)  Rubella: Immune (07/08 0000)  Vaccines: TDaP UTD         Flu    UTD   Physical Exam:  General: alert, cooperative and no distress Abdomen: soft, nontender, normal bowel sounds, + panus Incision: Prevena dressing in place and patent Uterine Fundus: firm, below umbilicus, nontender Lochia: minimal Ext: Trace pedal and pretibial edema, no redness or tenderness in the calves or thighs      Assessment/Plan: 44 y.o.   POD# 2. 43                  Principal Problem:   Postpartum care following  cesarean delivery (1/26) Active Problems:   Cesarean delivery delivered   Anxiety  - stable on BuSpar and Celexa   Status post repeat low transverse cesarean section   Doing well, stable.               Advance diet as tolerated Encourage rest when baby rests Breastfeeding support Encourage to ambulate Routine post-op care  02-13-1997, CNM, MSN 07/29/2019, 8:43 AM

## 2019-07-29 NOTE — Lactation Note (Signed)
This note was copied from a baby's chart. Lactation Consultation Note  Patient Name: Kimberly Santiago [id number] Date: 07/29/2019 Reason for consult: Follow-up assessment;Difficult latch;Term  Mom verbalized to the NT that she wasn't going to want help with latching as she is feeling overwhelmed.   RN aware and will encourage frequent pumping to establish and support her milk supply.   Mom to pace bottle feed donor milk+/EBM with cues.   Judee Clara 07/29/2019, 12:51 PM

## 2019-07-29 NOTE — Lactation Note (Addendum)
This note was copied from a baby's chart. Lactation Consultation Note  Patient Name: Kimberly Santiago MIWOE'H Date: 07/29/2019 Reason for consult: Follow-up assessment;Difficult latch;Term AMA, history of infertility, IVF pregnancy, history of gastric bypass, obesity, C/Section  LC in to visit with P2 Mom of term baby at 47 hrs old. Baby at 6.1% weight loss with good output.  Baby transitioned back to Mom from NICU.  Baby has been taking donor milk by bottle.  Baby has had a difficult time latching to breast.  RN tried a 20 mm nipple shield with EBM in the nipple shield, baby took 5 ml and fell asleep, needing to bottle feed the rest.    Assisted with positioning at the breast as baby is awake and cueing.  Recommended taking baby's clothes off and placing her STS.  Baby becoming frantic.  Baby unable to latch, so initiated a 24, then a 20 nipple shield.  Mom's breasts are very soft and floppy making it difficult for baby to maintain a latch.  Mom placing her fingers on the nipple shield, using a scissor hold.  Assisted with hand placement further back on breast, using a C hold and holding baby's head from ear to ear.  Instilled 3 ml of EBM in nipple shield and baby sucked it down and then fell asleep.   Placed baby STS on Mom's chest.  Mom will call out for assistance with early cues.    Mom hasn't been consistent pumping, but will increase frequency today.        Feeding Feeding Type: Breast Milk  LATCH Score Latch: Repeated attempts needed to sustain latch, nipple held in mouth throughout feeding, stimulation needed to elicit sucking reflex.  Audible Swallowing: A few with stimulation(EBM placed in nipple shield with curved tip syringe)  Type of Nipple: Everted at rest and after stimulation  Comfort (Breast/Nipple): Soft / non-tender  Hold (Positioning): Full assist, staff holds infant at breast  LATCH Score: 6  Interventions Interventions: Breast feeding basics  reviewed;Assisted with latch;Skin to skin;Breast massage;Hand express;Adjust position;Support pillows;Position options;Expressed milk;DEBP  Lactation Tools Discussed/Used Tools: Pump;Nipple Shields Nipple shield size: 20;24 Breast pump type: Double-Electric Breast Pump   Consult Status Consult Status: Follow-up Date: 07/29/19 Follow-up type: In-patient    Judee Clara 07/29/2019, 12:02 PM

## 2019-07-30 DIAGNOSIS — Z515 Encounter for palliative care: Secondary | ICD-10-CM

## 2019-07-30 MED ORDER — OXYCODONE-ACETAMINOPHEN 5-325 MG PO TABS: 1.0000 | ORAL_TABLET | ORAL | 0 refills | Status: DC | PRN

## 2019-07-30 NOTE — Lactation Note (Signed)
This note was copied from a baby's chart. Lactation Consultation Note  Patient Name: Kimberly Santiago Date: 07/30/2019 Reason for consult: Follow-up assessment;Infant weight loss;Term Type of Endocrine Disorder?: PCOS ,  2nd LC visit , mom sitting in bed, baby in the crib awake and fussy. LC checked diaper / dry and handed mom the baby.  Mom mentioned there wasn't donor  milk available so used formula and was to tired to breastfeed all night.  LC offered to assess feeding and per mom requested to latch privately.  Mom had mentioned she is using a #20 NS and  Baby is breast feeding well with it.  Mom denies nipple soreness. Sore nipple and engorgement prevention and tx reviewed . Per mom has 2 DEBP at home [place] and a Spectra ). Storage of breast milk reviewed.  LC recommended since she is using a NS to latch to have LC F/U in 5-7 days.  Mom mentioned her Pedis group - [facility] has Lactation services that  She can F/U with.      Maternal Data    Feeding Feeding Type: Formula  LATCH Score                   Interventions Interventions: Breast feeding basics reviewed  Lactation Tools Discussed/Used Pump Review: Milk Storage   Consult Status Consult Status: Follow-up Date: 07/30/19 Follow-up type: Out-patient    Kimberly Santiago 07/30/2019, 9:37 AM

## 2019-07-30 NOTE — Progress Notes (Signed)
Patient ID: Kimberly Santiago, female   DOB: October 27, 1975, 44 y.o.   MRN: [id number] Subjective: POD# 3 Live born female  Birth Weight: 7 lb 9.7 oz (3450 g) APGAR: 8, 8  Newborn Delivery   Birth date/time: 07/27/2019 07:52:00 Delivery type: C-Section, Low Transverse Trial of labor: No C-section categorization: Repeat     Baby name: Glenis Smoker  Feeding: breast  Pain control at delivery: Spinal   Reports feeling well but tired.  Patient reports tolerating PO.   Breast symptoms:+ colostrum, pumping, baby returned from NICU but poor latch. Working w/ LC Pain controlled with PO meds Denies HA/SOB/C/P/N/V/dizziness. Flatus present. She reports vaginal bleeding as normal, without clots.  She is ambulating, urinating without difficulty.     Objective:   VS:    Vitals:   07/29/19 0615 07/29/19 1400 07/29/19 2138 07/30/19 0451  BP: 129/81 116/84 117/75 122/81  Pulse: (!) 58 68 91 69  Resp: 15 16  18   Temp: 98.2 F (36.8 C) 98.2 F (36.8 C) 98.4 F (36.9 C) 97.8 F (36.6 C)  TempSrc: Oral Oral Oral Oral  SpO2: 100%  96% 100%  Weight:      Height:        No intake or output data in the 24 hours ending 07/30/19 0932      Recent Labs    07/28/19 0443  WBC 15.0*  HGB 11.3*  HCT 33.8*  PLT 204     Blood type: --/--/AB POS, AB POS Performed at [facility], [address], [place], [place]  [phone] (02/58)  Rubella: Immune (07/08 0000)  Vaccines: TDaP UTD         Flu    UTD   Physical Exam:  General: alert, cooperative and no distress Abdomen: soft, nontender, normal bowel sounds, + panus Incision: Prevena dressing in place and patent Uterine Fundus: firm, below umbilicus, nontender Lochia: minimal Ext: Trace pedal and pretibial edema, no redness or tenderness in the calves or thighs      Assessment/Plan: 44 y.o.   POD# 3. 44                  Principal Problem:   Postpartum care following cesarean delivery (1/26) Active Problems:  Cesarean delivery delivered   Anxiety  - stable on BuSpar and Celexa   Status post repeat low transverse cesarean section   Doing well, stable.    DC home Oxycodone FU in office Tuesday for Prevena removal            Thursday, MD 07/30/2019, 9:32 AM

## 2019-07-30 NOTE — Lactation Note (Signed)
This note was copied from a baby's chart. Lactation Consultation Note  Patient Name: Kimberly Santiago DUPBD'H Date: 07/30/2019 Reason for consult: Follow-up assessment;Term;Infant weight loss;Maternal endocrine disorder(will F/U mom needed  bathroom - LC updated the doc flow shee) Type of Endocrine Disorder?: PCOS   Maternal Data    Feeding Feeding Type: Formula  LATCH Score                   Interventions    Lactation Tools Discussed/Used     Consult Status Consult Status: Follow-up Date: 07/30/19 Follow-up type: In-patient    [name] 07/30/2019, 8:56 AM

## 2019-07-31 DIAGNOSIS — Z713 Dietary counseling and surveillance: Secondary | ICD-10-CM | POA: Diagnosis not present

## 2019-07-31 DIAGNOSIS — Z00129 Encounter for routine child health examination without abnormal findings: Secondary | ICD-10-CM | POA: Diagnosis not present

## 2019-08-01 NOTE — Discharge Summary (Signed)
Obstetric Discharge Summary Reason for Admission: cesarean section Prenatal Procedures: none Intrapartum Procedures: cesarean: low cervical, transverse Postpartum Procedures: none Complications-Operative and Postpartum: none Hemoglobin  Date Value Ref Range Status  07/28/2019 11.3 (L) 12.0 - 15.0 g/dL Final   HGB  Date Value Ref Range Status  01/03/2014 13.6 12.0 - 16.0 g/dL Final   HCT  Date Value Ref Range Status  07/28/2019 33.8 (L) 36.0 - 46.0 % Final  01/03/2014 42.1 35.0 - 47.0 % Final    Physical Exam:  General: alert and cooperative Lochia: appropriate Uterine Fundus: firm Incision: healing well DVT Evaluation: No evidence of DVT seen on physical exam.  Discharge Diagnoses: Term Pregnancy-delivered  Discharge Information: Date: 08/01/2019 Activity: pelvic rest Diet: routine Medications: PNV, Ibuprofen, Iron and Percocet Condition: stable Instructions: refer to practice specific booklet Discharge to: home   Newborn Data: Live born female  Birth Weight: 7 lb 9.7 oz (3450 g) APGAR: 8, 8  Newborn Delivery   Birth date/time: 07/27/2019 07:52:00 Delivery type: C-Section, Low Transverse Trial of labor: No C-section categorization: Repeat      Home with mother.  Kimberly Santiago 08/01/2019, 8:59 AM

## 2019-08-03 DIAGNOSIS — Z4801 Encounter for change or removal of surgical wound dressing: Secondary | ICD-10-CM | POA: Diagnosis not present

## 2019-08-04 ENCOUNTER — Ambulatory Visit: Payer: Self-pay | Admitting: Family Medicine

## 2019-08-13 DIAGNOSIS — B372 Candidiasis of skin and nail: Secondary | ICD-10-CM | POA: Diagnosis not present

## 2019-08-23 ENCOUNTER — Ambulatory Visit: Payer: BC Managed Care – PPO | Admitting: Family Medicine

## 2019-08-23 ENCOUNTER — Other Ambulatory Visit: Payer: Self-pay

## 2019-08-23 ENCOUNTER — Encounter: Payer: Self-pay | Admitting: Family Medicine

## 2019-08-23 VITALS — BP 116/76 | HR 88 | Temp 98.2°F | Ht 66.0 in | Wt 255.0 lb

## 2019-08-23 DIAGNOSIS — Z7689 Persons encountering health services in other specified circumstances: Secondary | ICD-10-CM | POA: Diagnosis not present

## 2019-08-23 DIAGNOSIS — M25552 Pain in left hip: Secondary | ICD-10-CM

## 2019-08-23 DIAGNOSIS — R21 Rash and other nonspecific skin eruption: Secondary | ICD-10-CM

## 2019-08-23 DIAGNOSIS — K219 Gastro-esophageal reflux disease without esophagitis: Secondary | ICD-10-CM

## 2019-08-23 DIAGNOSIS — F419 Anxiety disorder, unspecified: Secondary | ICD-10-CM

## 2019-08-23 DIAGNOSIS — F331 Major depressive disorder, recurrent, moderate: Secondary | ICD-10-CM | POA: Diagnosis not present

## 2019-08-23 MED ORDER — CITALOPRAM HYDROBROMIDE 40 MG PO TABS: 40.0000 mg | ORAL_TABLET | Freq: Every day | ORAL | 1 refills | Status: DC

## 2019-08-23 NOTE — Progress Notes (Signed)
BP 116/76   Pulse 88   Temp 98.2 F (36.8 C) (Oral)   Ht 5\' 6"  (1.676 m)   Wt 255 lb (115.7 kg)   SpO2 97%   BMI 41.16 kg/m    Subjective:    Patient ID: , female    DOB: Aug 16, 1975, 44 y.o.   MRN: [id number]  HPI: Kimberly Santiago is a 44 y.o. female  Chief Complaint  Patient presents with  . Establish Care    pt woul like to discuss about her recent C-section  . Hip Pain    left side   Patient presenting today to establish care.   Had repeat c-section 07/27/19, having yeast rashes down at the incision. Nystatin-triamcinolone helping with this but still having itching rash that's from adhesives. Last saw surgeon about 2 weeks ago and has another appt in 2 weeks. Denies bleeding, severe pain, fevers.   Left sided hip pain. Had siatica with first pregnancy that went away after delivery. Same issue with this pregnancy but not dissipating. Aching, no current radiation of pain. Not taking anything for sxs, but has been stretching regularly.   Pregnancy related reflux, seems to be getting better the longer time goes since delivery. Currently on protonix prn.   Anxiety and depression - doing very well with that, feels medications are doing a great job and things are well controlled. The buspar was recently added at the end of pregnancy and she doesn't feel it's very helpful but wants to keep it on board for now.   GAD 7 : Generalized Anxiety Score 08/23/2019  Nervous, Anxious, on Edge 1  Control/stop worrying 0  Worry too much - different things 0  Trouble relaxing 1  Restless 0  Easily annoyed or irritable 0  Afraid - awful might happen 0  Total GAD 7 Score 2  Anxiety Difficulty Not difficult at all   Depression screen [facility] 2/9 08/23/2019  Decreased Interest 0  Down, Depressed, Hopeless 0  PHQ - 2 Score 0  Altered sleeping 1  Tired, decreased energy 1  Change in appetite 0  Feeling bad or failure about yourself  0  Trouble concentrating 1  Moving slowly or  fidgety/restless 0  Suicidal thoughts 0  PHQ-9 Score 3   Relevant past medical, surgical, family and social history reviewed and updated as indicated. Interim medical history since our last visit reviewed. Allergies and medications reviewed and updated.  Review of Systems  Per HPI unless specifically indicated above     Objective:    BP 116/76   Pulse 88   Temp 98.2 F (36.8 C) (Oral)   Ht 5\' 6"  (1.676 m)   Wt 255 lb (115.7 kg)   SpO2 97%   BMI 41.16 kg/m   Wt Readings from Last 3 Encounters:  08/23/19 255 lb (115.7 kg)  07/27/19 273 lb 4.8 oz (124 kg)  07/14/19 273 lb (123.8 kg)    Physical Exam Vitals and nursing note reviewed.  Constitutional:      Appearance: Normal appearance. She is not ill-appearing.  HENT:     Head: Atraumatic.  Eyes:     Extraocular Movements: Extraocular movements intact.     Conjunctiva/sclera: Conjunctivae normal.  Cardiovascular:     Rate and Rhythm: Normal rate and regular rhythm.     Heart sounds: Normal heart sounds.  Pulmonary:     Effort: Pulmonary effort is normal.     Breath sounds: Normal breath sounds.  Abdominal:  General: Bowel sounds are normal.     Palpations: Abdomen is soft.     Tenderness: There is no abdominal tenderness.  Musculoskeletal:        General: Normal range of motion.     Cervical back: Normal range of motion and neck supple.  Skin:    General: Skin is warm.     Comments: Scattered erythematous maculopapular rash surrounding c-section incision. Incision healing well without drainage  Neurological:     Mental Status: She is alert and oriented to person, place, and time.  Psychiatric:        Mood and Affect: Mood normal.        Thought Content: Thought content normal.        Judgment: Judgment normal.     Results for orders placed or performed during the hospital encounter of 07/27/19  CBC  Result Value Ref Range   WBC 15.0 (H) 4.0 - 10.5 K/uL   RBC 3.77 (L) 3.87 - 5.11 MIL/uL   Hemoglobin  11.3 (L) 12.0 - 15.0 g/dL   HCT 33.8 (L) 36.0 - 46.0 %   MCV 89.7 80.0 - 100.0 fL   MCH 30.0 26.0 - 34.0 pg   MCHC 33.4 30.0 - 36.0 g/dL   RDW 13.0 11.5 - 15.5 %   Platelets 204 150 - 400 K/uL   nRBC 0.0 0.0 - 0.2 %  Collect bld for placenta donatation  Result Value Ref Range   Placenta donation bld collect Collected by Laboratory   Surgical pathology  Result Value Ref Range   SURGICAL PATHOLOGY      SURGICAL PATHOLOGY CASE: [id number] PATIENT: Kimberly Santiago Surgical Pathology Report     Clinical History: Previous cesarean section, desires sterilization (cm)     FINAL MICROSCOPIC DIAGNOSIS:  A. FALLOPIAN TUBE, RIGHT, PORTION, TUBAL LIGATION: - Complete cross-section of benign fallopian tube. - Paratubal cyst.  B. FALLOPIAN TUBE, LEFT, PORTION, TUBAL LIGATION: - Complete cross-section of benign fallopian tube.   GROSS DESCRIPTION:  Specimen A: Received fresh labeled right is a 1.3 x 0.5 cm tubular tissue without fimbria, has a pink-red smooth serosa and unremarkable cut surface.  Representative sections in one block.  Specimen B: Received fresh labeled left is a 1.8 cm in length and up to 0.7 cm in diameter tubular tissue without fimbria, has a pink-red smooth serosa and unremarkable cut surface.  Representative sections in one block.  SW 07/27/2019   Final Diagnosis performed by Vicente Males, MD.   Electronically signed 07/28/2019 Technical an d / or Professional components performed at [facility], [place] [address], [place], [place] [address].  Immunohistochemistry Technical component (if applicable) was performed at [facility]. [address], [place], [place], [place] [address].   IMMUNOHISTOCHEMISTRY DISCLAIMER (if applicable): Some of these immunohistochemical stains may have been developed and the performance characteristics determine by [facility]. Some may not have been cleared or approved by the  U.S. Food and Drug Administration. The FDA has determined that such clearance or approval is not necessary. This test is used for clinical purposes. It should not be regarded as investigational or for research. This laboratory is certified under the [employer] (CLIA-88) as qualified to perform high complexity clinical laboratory testing.  The controls stained appropriately.       Assessment & Plan:   Problem List Items Addressed This Visit      Digestive   GERD (gastroesophageal reflux disease)    Stable  on protonix regimen, continue in addition to lifestyle modifications        Other   Anxiety - Primary    Stable and fairly well controlled, continue current regimen      Relevant Medications   citalopram (CELEXA) 40 MG tablet   Depression    Stable and under good control, continue current regimen      Relevant Medications   citalopram (CELEXA) 40 MG tablet    Other Visit Diagnoses    Encounter to establish care       Rash       Continue mycolog, increase to BID use and continue use of powders to keep area dry   Left hip pain       Continue stretches, tylenol prn. Will continue to monitor as her body recovers from pregnancy       Follow up plan: Return for CPE.

## 2019-08-29 DIAGNOSIS — K219 Gastro-esophageal reflux disease without esophagitis: Secondary | ICD-10-CM | POA: Insufficient documentation

## 2019-08-29 NOTE — Assessment & Plan Note (Signed)
Stable on protonix regimen, continue in addition to lifestyle modifications

## 2019-08-29 NOTE — Assessment & Plan Note (Signed)
Stable and fairly well controlled, continue current regimen 

## 2019-08-29 NOTE — Assessment & Plan Note (Signed)
Stable and under good control, continue current regimen 

## 2019-09-10 ENCOUNTER — Ambulatory Visit: Payer: BC Managed Care – PPO

## 2019-09-10 ENCOUNTER — Ambulatory Visit: Payer: BC Managed Care – PPO | Attending: Internal Medicine

## 2019-09-10 DIAGNOSIS — Z23 Encounter for immunization: Secondary | ICD-10-CM

## 2019-09-10 NOTE — Progress Notes (Signed)
   Covid-19 Vaccination Clinic  Name:  Kimberly Santiago    MRN: [id number] DOB: 1976/05/03  09/10/2019  Kimberly Santiago was observed post Covid-19 immunization for 15 minutes without incident. She was provided with Vaccine Information Sheet and instruction to access the V-Safe system.   Kimberly Santiago was instructed to call 911 with any severe reactions post vaccine: Marland Kitchen Difficulty breathing  . Swelling of face and throat  . A fast heartbeat  . A bad rash all over body  . Dizziness and weakness   Immunizations Administered    Name Date Dose VIS Date Route   Moderna COVID-19 Vaccine 09/10/2019  2:45 AM 0.5 mL 06/01/2019 Intramuscular   Manufacturer: Moderna   Lot: [id number]   NDC: [id number]

## 2019-09-16 ENCOUNTER — Other Ambulatory Visit: Payer: Self-pay

## 2019-09-16 ENCOUNTER — Ambulatory Visit (INDEPENDENT_AMBULATORY_CARE_PROVIDER_SITE_OTHER): Payer: BC Managed Care – PPO | Admitting: Family Medicine

## 2019-09-16 ENCOUNTER — Encounter: Payer: Self-pay | Admitting: Family Medicine

## 2019-09-16 VITALS — BP 94/61 | HR 67 | Temp 98.4°F | Ht 66.0 in | Wt 262.0 lb

## 2019-09-16 DIAGNOSIS — Z0000 Encounter for general adult medical examination without abnormal findings: Secondary | ICD-10-CM | POA: Diagnosis not present

## 2019-09-16 DIAGNOSIS — M25552 Pain in left hip: Secondary | ICD-10-CM

## 2019-09-16 DIAGNOSIS — Z136 Encounter for screening for cardiovascular disorders: Secondary | ICD-10-CM | POA: Diagnosis not present

## 2019-09-16 LAB — UA/M W/RFLX CULTURE, ROUTINE
Bilirubin, UA: NEGATIVE
Glucose, UA: NEGATIVE
Ketones, UA: NEGATIVE
Leukocytes,UA: NEGATIVE
Nitrite, UA: NEGATIVE
Protein,UA: NEGATIVE
RBC, UA: NEGATIVE
Specific Gravity, UA: 1.02 (ref 1.005–1.030)
Urobilinogen, Ur: 1 mg/dL (ref 0.2–1.0)
pH, UA: 6 (ref 5.0–7.5)

## 2019-09-16 NOTE — Progress Notes (Signed)
BP 94/61   Pulse 67   Temp 98.4 F (36.9 C) (Oral)   Ht 5\' 6"  (1.676 m)   Wt 262 lb (118.8 kg)   SpO2 97%   BMI 42.29 kg/m    Subjective:    Patient ID: Kimberly Santiago, female    DOB: [birth year], 44 y.o.   MRN: [id number]  HPI: Kimberly Santiago is a 44 y.o. female presenting on 09/16/2019 for comprehensive medical examination. Current medical complaints include:see below  Still having the significant left hip pain, tylenol helping mildly but nothing is really taking it away for good. Trying stretches, heat, exercise with no relief. Denies swelling, numbness, tingling down leg.   GYN managing pap smears, she is due in 6 months.   She currently lives with: Menopausal Symptoms: no  Depression Screen done today and results listed below:  [facility] 09/16/2019 08/23/2019  Decreased Interest 0 0  Down, Depressed, Hopeless 0 0  PHQ - 2 Score 0 0  Altered sleeping 0 1  Tired, decreased energy 0 1  Change in appetite 0 0  Feeling bad or failure about yourself  0 0  Trouble concentrating 0 1  Moving slowly or fidgety/restless 0 0  Suicidal thoughts 0 0  PHQ-9 Score 0 3    The patient does not have a history of falls. I did complete a risk assessment for falls. A plan of care for falls was documented.   Past Medical History:  Past Medical History:  Diagnosis Date  . Anemia   . Asthma   . Barrett esophagus   . Complication of anesthesia    Pt stopped breathing during EGD but did well with recent Gastric Bypass Surgery  . Depression   . GERD (gastroesophageal reflux disease)    H/O  . Headache    H/O MIGRAINES  . History of hiatal hernia    SMALL  . PCOS (polycystic ovarian syndrome)   . PONV (postoperative nausea and vomiting)   . Sleep apnea    PT IS NOT USING CPAP    Surgical History:  Past Surgical History:  Procedure Laterality Date  . CESAREAN SECTION N/A 08/18/2017   Procedure: Primary CESAREAN SECTION;  Surgeon: Noland FordyceFogleman, Kelly, MD;   [facility];  Service: Obstetrics;  Laterality: N/A;  EDD: 09/13/17 Allergy: Amoxicillin, Morphine, Hydrocodone  . CESAREAN SECTION WITH BILATERAL TUBAL LIGATION Bilateral 07/27/2019   Procedure: Repeat CESAREAN SECTION WITH BILATERAL TUBAL LIGATION;  Surgeon: Noland FordyceFogleman, Kelly, MD;  Location: [facility];  Service: Obstetrics;  Laterality: Bilateral;  EDD: 07/31/19  . CHOLECYSTECTOMY    . DIAGNOSTIC LAPAROSCOPY    . EAR CYST EXCISION N/A 11/24/2014   Procedure: CYST REMOVAL;  Surgeon: Bud Facereighton Vaught, MD;  Location: [facility] ORS;  Service: ENT;  Laterality: N/A;  upper lip  . ESOPHAGOGASTRODUODENOSCOPY    . ETHMOIDECTOMY Right 05/07/2018   Procedure: ETHMOIDECTOMY;  Surgeon: Bud FaceVaught, Creighton, MD;  Location: [facility] ORS;  Service: ENT;  Laterality: Right;  . FRONTAL SINUS EXPLORATION Right 05/07/2018   Procedure: FRONTAL SINUS EXPLORATION;  Surgeon: Bud FaceVaught, Creighton, MD;  Location: [facility];  Service: ENT;  Laterality: Right;  . GASTRIC BYPASS  March 2016  . IMAGE GUIDED SINUS SURGERY N/A 05/07/2018   Procedure: IMAGE GUIDED SINUS SURGERY;  Surgeon: Bud FaceVaught, Creighton, MD;  Location: [facility] ORS;  Service: ENT;  Laterality: N/A;  . KNEE ARTHROSCOPY Right   . MAXILLARY ANTROSTOMY Bilateral 05/07/2018   Procedure: MAXILLARY ANTROSTOMY;  Surgeon: Bud FaceVaught, Creighton, MD;  Location: [facility] OR[facility];  Service: ENT;  Laterality: Bilateral;  . NASAL SEPTOPLASTY W/ TURBINOPLASTY Bilateral 05/07/2018   Procedure: NASAL SEPTOPLASTY WITH TURBINATE REDUCTION;  Surgeon: Bud Face, MD;  Location: [facility] ORS;  Service: ENT;  Laterality: Bilateral;  . SPHENOIDECTOMY Right 05/07/2018   Procedure: SPHENOIDECTOMY;  Surgeon: Bud Face, MD;  Location: [facility] ORS;  Service: ENT;  Laterality: Right;  . TEMPOROMANDIBULAR JOINT SURGERY    . WISDOM TOOTH EXTRACTION      Medications:  Current Outpatient Medications on File Prior to Visit  Medication Sig  . acetaminophen (TYLENOL) 325 MG tablet Take 650 mg by mouth  every 6 (six) hours as needed for mild pain or headache.  . busPIRone (BUSPAR) 7.5 MG tablet Take 7.5 mg by mouth at bedtime.  . citalopram (CELEXA) 40 MG tablet Take 1 tablet (40 mg total) by mouth at bedtime.  . fluticasone (FLONASE) 50 MCG/ACT nasal spray Place 2 sprays into both nostrils at bedtime.  Marland Kitchen nystatin-triamcinolone ointment (MYCOLOG) Apply 1 application topically every other day.  . pantoprazole (PROTONIX) 40 MG tablet Take 40 mg by mouth daily.  . Prenatal Vit-Fe Fumarate-FA (PRENATAL MULTIVITAMIN) TABS tablet Take 1 tablet by mouth daily at 12 noon.   No current facility-administered medications on file prior to visit.    Allergies:  Allergies  Allergen Reactions  . Hydromorphone Shortness Of Breath, Nausea And Vomiting and Other (See Comments)    Chest pain  . Hydrocodone Nausea And Vomiting and Other (See Comments)    Headache, Patient can take oxycodone.  . Morphine And Related Nausea And Vomiting and Other (See Comments)    Headache  . Nsaids Other (See Comments)    Pt cannot take due to Gastric Bypass Surgery  . Amoxicillin Itching    Can take cephalexin. Did it involve swelling of the face/tongue/throat, SOB, or low BP? No Did it involve sudden or severe rash/hives, skin peeling, or any reaction on the inside of your mouth or nose? No Did you need to seek medical attention at a hospital or doctor's office? No When did it last happen?Several years If all above answers are "NO", may proceed with cephalosporin use.     Social History:  Social History   Socioeconomic History  . Marital status: Married    Spouse name: Not on file  . Number of children: Not on file  . Years of education: Not on file  . Highest education level: Not on file  Occupational History  . Not on file  Tobacco Use  . Smoking status: Never Smoker  . Smokeless tobacco: Never Used  Substance and Sexual Activity  . Alcohol use: Not Currently  . Drug use: No  . Sexual activity:  Yes  Other Topics Concern  . Not on file  Social History Narrative  . Not on file   Social Determinants of Health   Financial Resource Strain:   . Difficulty of Paying Living Expenses:   Food Insecurity:   . Worried About Programme researcher, broadcasting/film/video in the Last Year:   . Barista in the Last Year:   Transportation Needs:   . Freight forwarder (Medical):   Marland Kitchen Lack of Transportation (Non-Medical):   Physical Activity:   . Days of Exercise per Week:   . Minutes of Exercise per Session:   Stress:   . Feeling of Stress :   Social Connections:   . Frequency of Communication with Friends and Family:   . Frequency of Social Gatherings  with Friends and Family:   . Attends Religious Services:   . Active Member of Clubs or Organizations:   . Attends Archivist Meetings:   Marland Kitchen Marital Status:   Intimate Partner Violence:   . Fear of Current or Ex-Partner:   . Emotionally Abused:   Marland Kitchen Physically Abused:   . Sexually Abused:    Social History   Tobacco Use  Smoking Status Never Smoker  Smokeless Tobacco Never Used   Social History   Substance and Sexual Activity  Alcohol Use Not Currently    Family History:  Family History  Problem Relation Age of Onset  . Breast cancer Paternal Aunt        pat great aunt  . Heart disease Maternal Grandmother   . Heart disease Maternal Grandfather   . Heart disease Paternal Grandmother   . Heart disease Paternal Grandfather   . Anxiety disorder Mother   . Depression Mother   . Miscarriages / [place] Sister     Past medical history, surgical history, medications, allergies, family history and social history reviewed with patient today and changes made to appropriate areas of the chart.   Review of Systems - General ROS: negative Psychological ROS: negative Ophthalmic ROS: negative ENT ROS: negative Allergy and Immunology ROS: negative Hematological and Lymphatic ROS: negative Endocrine ROS: negative Breast ROS:  negative for breast lumps Respiratory ROS: no cough, shortness of breath, or wheezing Cardiovascular ROS: no chest pain or dyspnea on exertion Gastrointestinal ROS: no abdominal pain, change in bowel habits, or black or bloody stools Genito-Urinary ROS: no dysuria, trouble voiding, or hematuria Musculoskeletal ROS: positive for - left hip pain Neurological ROS: no TIA or stroke symptoms Dermatological ROS: negative All other ROS negative except what is listed above and in the HPI.      Objective:    BP 94/61   Pulse 67   Temp 98.4 F (36.9 C) (Oral)   Ht 5\' 6"  (1.676 m)   Wt 262 lb (118.8 kg)   SpO2 97%   BMI 42.29 kg/m   Wt Readings from Last 3 Encounters:  09/16/19 262 lb (118.8 kg)  08/23/19 255 lb (115.7 kg)  07/27/19 273 lb 4.8 oz (124 kg)    Physical Exam Vitals and nursing note reviewed.  Constitutional:      General: She is not in acute distress.    Appearance: She is well-developed.  HENT:     Head: Atraumatic.     Right Ear: External ear normal.     Left Ear: External ear normal.     Nose: Nose normal.     Mouth/Throat:     Pharynx: No oropharyngeal exudate.  Eyes:     General: No scleral icterus.    Conjunctiva/sclera: Conjunctivae normal.     Pupils: Pupils are equal, round, and reactive to light.  Neck:     Thyroid: No thyromegaly.  Cardiovascular:     Rate and Rhythm: Normal rate and regular rhythm.     Heart sounds: Normal heart sounds.  Pulmonary:     Effort: Pulmonary effort is normal. No respiratory distress.     Breath sounds: Normal breath sounds.  Chest:     Comments: Breast exam done through GYN Abdominal:     General: Bowel sounds are normal.     Palpations: Abdomen is soft. There is no mass.     Tenderness: There is no abdominal tenderness.  Genitourinary:    Comments: Deferred to GYN Musculoskeletal:  General: No tenderness. Normal range of motion.     Cervical back: Normal range of motion and neck supple.  Lymphadenopathy:       Cervical: No cervical adenopathy.  Skin:    General: Skin is warm and dry.     Findings: No rash.  Neurological:     Mental Status: She is alert and oriented to person, place, and time.     Cranial Nerves: No cranial nerve deficit.  Psychiatric:        Behavior: Behavior normal.     Results for orders placed or performed in visit on 09/16/19  CBC with Differential/Platelet  Result Value Ref Range   WBC 5.5 3.4 - 10.8 x10E3/uL   RBC 4.35 3.77 - 5.28 x10E6/uL   Hemoglobin 12.8 11.1 - 15.9 g/dL   Hematocrit 83.4 19.6 - 46.6 %   MCV 88 79 - 97 fL   MCH 29.4 26.6 - 33.0 pg   MCHC 33.6 31.5 - 35.7 g/dL   RDW 22.2 97.9 - 89.2 %   Platelets 275 150 - 450 x10E3/uL   Neutrophils 53 Not Estab. %   Lymphs 29 Not Estab. %   Monocytes 10 Not Estab. %   Eos 6 Not Estab. %   Basos 2 Not Estab. %   Neutrophils Absolute 2.9 1.4 - 7.0 x10E3/uL   Lymphocytes Absolute 1.6 0.7 - 3.1 x10E3/uL   Monocytes Absolute 0.6 0.1 - 0.9 x10E3/uL   EOS (ABSOLUTE) 0.3 0.0 - 0.4 x10E3/uL   Basophils Absolute 0.1 0.0 - 0.2 x10E3/uL   Immature Granulocytes 0 Not Estab. %   Immature Grans (Abs) 0.0 0.0 - 0.1 x10E3/uL  Comprehensive metabolic panel  Result Value Ref Range   Glucose 76 65 - 99 mg/dL   BUN 9 6 - 24 mg/dL   Creatinine, Ser 1.19 0.57 - 1.00 mg/dL   GFR calc non Af Amer 107 >59 mL/min/1.73   GFR calc Af Amer 124 >59 mL/min/1.73   BUN/Creatinine Ratio 13 9 - 23   Sodium 141 134 - 144 mmol/L   Potassium 3.9 3.5 - 5.2 mmol/L   Chloride 104 96 - 106 mmol/L   CO2 26 20 - 29 mmol/L   Calcium 8.6 (L) 8.7 - 10.2 mg/dL   Total Protein 5.7 (L) 6.0 - 8.5 g/dL   Albumin 3.6 (L) 3.8 - 4.8 g/dL   Globulin, Total 2.1 1.5 - 4.5 g/dL   Albumin/Globulin Ratio 1.7 1.2 - 2.2   Bilirubin Total 0.3 0.0 - 1.2 mg/dL   Alkaline Phosphatase 83 39 - 117 IU/L   AST 22 0 - 40 IU/L   ALT 26 0 - 32 IU/L  Lipid Panel w/o Chol/HDL Ratio  Result Value Ref Range   Cholesterol, Total 157 100 - 199 mg/dL    Triglycerides 45 0 - 149 mg/dL   HDL 74 >41 mg/dL   VLDL Cholesterol Cal 10 5 - 40 mg/dL   LDL Chol Calc (NIH) 73 0 - 99 mg/dL  TSH  Result Value Ref Range   TSH 2.770 0.450 - 4.500 uIU/mL  UA/M w/rflx Culture, Routine   Specimen: Urine   URINE  Result Value Ref Range   Specific Gravity, UA 1.020 1.005 - 1.030   pH, UA 6.0 5.0 - 7.5   Color, UA Yellow Yellow   Appearance Ur Clear Clear   Leukocytes,UA Negative Negative   Protein,UA Negative Negative/Trace   Glucose, UA Negative Negative   Ketones, UA Negative Negative   RBC, UA Negative Negative  Bilirubin, UA Negative Negative   Urobilinogen, Ur 1.0 0.2 - 1.0 mg/dL   Nitrite, UA Negative Negative      Assessment & Plan:   Problem List Items Addressed This Visit    None    Visit Diagnoses    Left hip pain    -  Primary   Obtain x-ray, continue OTC pain relievers and stretches. Refer to Orthopedics if not resolving   Relevant Orders   DG Hip Unilat W OR W/O Pelvis Min 4 Views Left   Annual physical exam       Relevant Orders   CBC with Differential/Platelet (Completed)   Comprehensive metabolic panel (Completed)   Lipid Panel w/o Chol/HDL Ratio (Completed)   TSH (Completed)   UA/M w/rflx Culture, Routine (Completed)       Follow up plan: Return in about 1 year (around 09/15/2020) for CPE.   LABORATORY TESTING:  - Pap smear: up to date  IMMUNIZATIONS:   - Tdap: Tetanus vaccination status reviewed: last tetanus booster within 10 years. - Influenza: Up to date  SCREENING: -Mammogram: managed by GYN   PATIENT COUNSELING:   Advised to take 1 mg of folate supplement per day if capable of pregnancy.   Sexuality: Discussed sexually transmitted diseases, partner selection, use of condoms, avoidance of unintended pregnancy  and contraceptive alternatives.   Advised to avoid cigarette smoking.  I discussed with the patient that most people either abstain from alcohol or drink within safe limits (<=14/week and <=4  drinks/occasion for males, <=7/weeks and <= 3 drinks/occasion for females) and that the risk for alcohol disorders and other health effects rises proportionally with the number of drinks per week and how often a drinker exceeds daily limits.  Discussed cessation/primary prevention of drug use and availability of treatment for abuse.   Diet: Encouraged to adjust caloric intake to maintain  or achieve ideal body weight, to reduce intake of dietary saturated fat and total fat, to limit sodium intake by avoiding high sodium foods and not adding table salt, and to maintain adequate dietary potassium and calcium preferably from fresh fruits, vegetables, and low-fat dairy products.    stressed the importance of regular exercise  Injury prevention: Discussed safety belts, safety helmets, smoke detector, smoking near bedding or upholstery.   Dental health: Discussed importance of regular tooth brushing, flossing, and dental visits.    NEXT PREVENTATIVE PHYSICAL DUE IN 1 YEAR. Return in about 1 year (around 09/15/2020) for CPE.

## 2019-09-17 ENCOUNTER — Encounter: Payer: BC Managed Care – PPO | Admitting: Family Medicine

## 2019-09-17 ENCOUNTER — Other Ambulatory Visit: Payer: Self-pay

## 2019-09-17 ENCOUNTER — Telehealth: Payer: Self-pay

## 2019-09-17 ENCOUNTER — Ambulatory Visit
Admission: RE | Admit: 2019-09-17 | Discharge: 2019-09-17 | Disposition: A | Discharge status: Home / Self Care | Payer: BC Managed Care – PPO | Source: Ambulatory Visit | Attending: Family Medicine | Admitting: Family Medicine

## 2019-09-17 ENCOUNTER — Encounter: Payer: Self-pay | Admitting: Family Medicine

## 2019-09-17 DIAGNOSIS — M25552 Pain in left hip: Secondary | ICD-10-CM | POA: Insufficient documentation

## 2019-09-17 LAB — CBC WITH DIFFERENTIAL/PLATELET
Basophils Absolute: 0.1 10*3/uL (ref 0.0–0.2)
Basos: 2 %
EOS (ABSOLUTE): 0.3 10*3/uL (ref 0.0–0.4)
Eos: 6 %
Hematocrit: 38.1 % (ref 34.0–46.6)
Hemoglobin: 12.8 g/dL (ref 11.1–15.9)
Immature Grans (Abs): 0 10*3/uL (ref 0.0–0.1)
Immature Granulocytes: 0 %
Lymphocytes Absolute: 1.6 10*3/uL (ref 0.7–3.1)
Lymphs: 29 %
MCH: 29.4 pg (ref 26.6–33.0)
MCHC: 33.6 g/dL (ref 31.5–35.7)
MCV: 88 fL (ref 79–97)
Monocytes Absolute: 0.6 10*3/uL (ref 0.1–0.9)
Monocytes: 10 %
Neutrophils Absolute: 2.9 10*3/uL (ref 1.4–7.0)
Neutrophils: 53 %
Platelets: 275 10*3/uL (ref 150–450)
RBC: 4.35 x10E6/uL (ref 3.77–5.28)
RDW: 11.8 % (ref 11.7–15.4)
WBC: 5.5 10*3/uL (ref 3.4–10.8)

## 2019-09-17 LAB — COMPREHENSIVE METABOLIC PANEL
ALT: 26 IU/L (ref 0–32)
AST: 22 IU/L (ref 0–40)
Albumin/Globulin Ratio: 1.7 (ref 1.2–2.2)
Albumin: 3.6 g/dL — ABNORMAL LOW (ref 3.8–4.8)
Alkaline Phosphatase: 83 IU/L (ref 39–117)
BUN/Creatinine Ratio: 13 (ref 9–23)
BUN: 9 mg/dL (ref 6–24)
Bilirubin Total: 0.3 mg/dL (ref 0.0–1.2)
CO2: 26 mmol/L (ref 20–29)
Calcium: 8.6 mg/dL — ABNORMAL LOW (ref 8.7–10.2)
Chloride: 104 mmol/L (ref 96–106)
Creatinine, Ser: 0.7 mg/dL (ref 0.57–1.00)
GFR calc Af Amer: 124 mL/min/{1.73_m2} (ref >59)
GFR calc non Af Amer: 107 mL/min/{1.73_m2} (ref >59)
Globulin, Total: 2.1 g/dL (ref 1.5–4.5)
Glucose: 76 mg/dL (ref 65–99)
Potassium: 3.9 mmol/L (ref 3.5–5.2)
Sodium: 141 mmol/L (ref 134–144)
Total Protein: 5.7 g/dL — ABNORMAL LOW (ref 6.0–8.5)

## 2019-09-17 LAB — LIPID PANEL W/O CHOL/HDL RATIO
Cholesterol, Total: 157 mg/dL (ref 100–199)
HDL: 74 mg/dL (ref >39)
LDL Chol Calc (NIH): 73 mg/dL (ref 0–99)
Triglycerides: 45 mg/dL (ref 0–149)
VLDL Cholesterol Cal: 10 mg/dL (ref 5–40)

## 2019-09-17 LAB — TSH: TSH: 2.77 u[IU]/mL (ref 0.450–4.500)

## 2019-09-17 IMAGING — CR DG HIP (WITH OR WITHOUT PELVIS) 2-3V*L*
1 series · 3 of 3 positions shown · non-contrast
Comparison: None.

CLINICAL DATA: Pt states that left hip pain radiating joint
traveling down to femur neck lateral side. Pt reports the pain
started when she was pregnant and gotten worse. Pt also states
having hx of sciatica.

EXAM:
DG HIP (WITH OR WITHOUT PELVIS) 2-3V LEFT

[Series 1: dg hip unilat w or w/o pelvis 2-3 views  · non-contrast · 0.14mm/px · 3 of 3 slices shown]
[im 1/3]
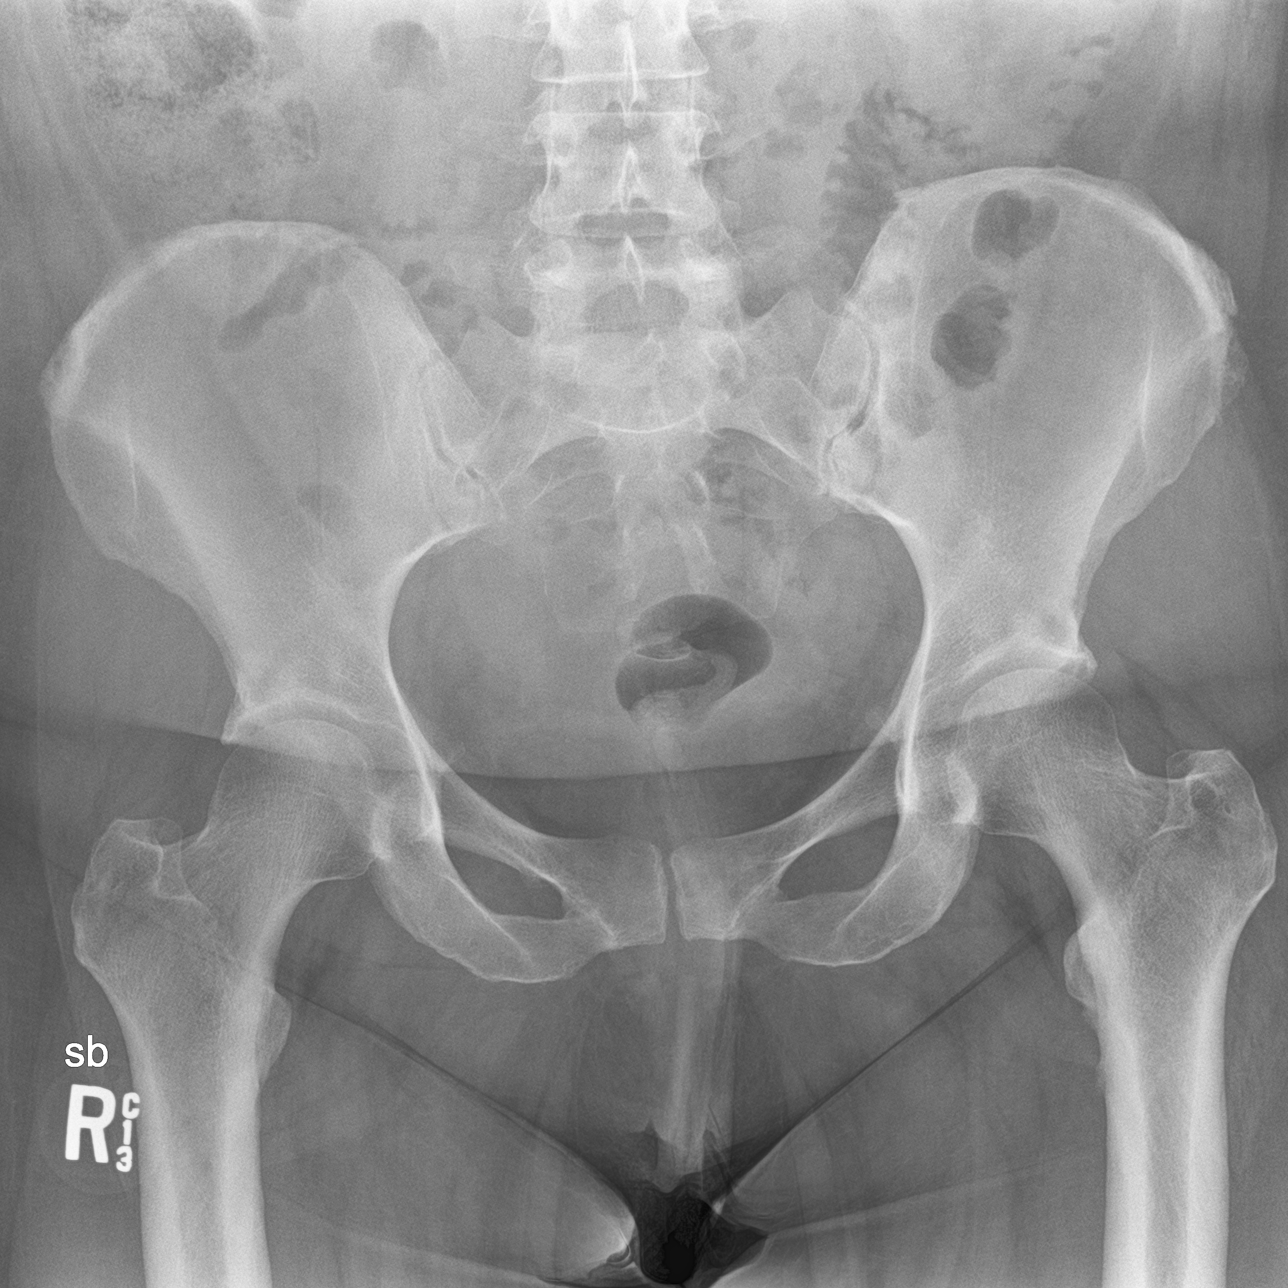
[im 2/3]
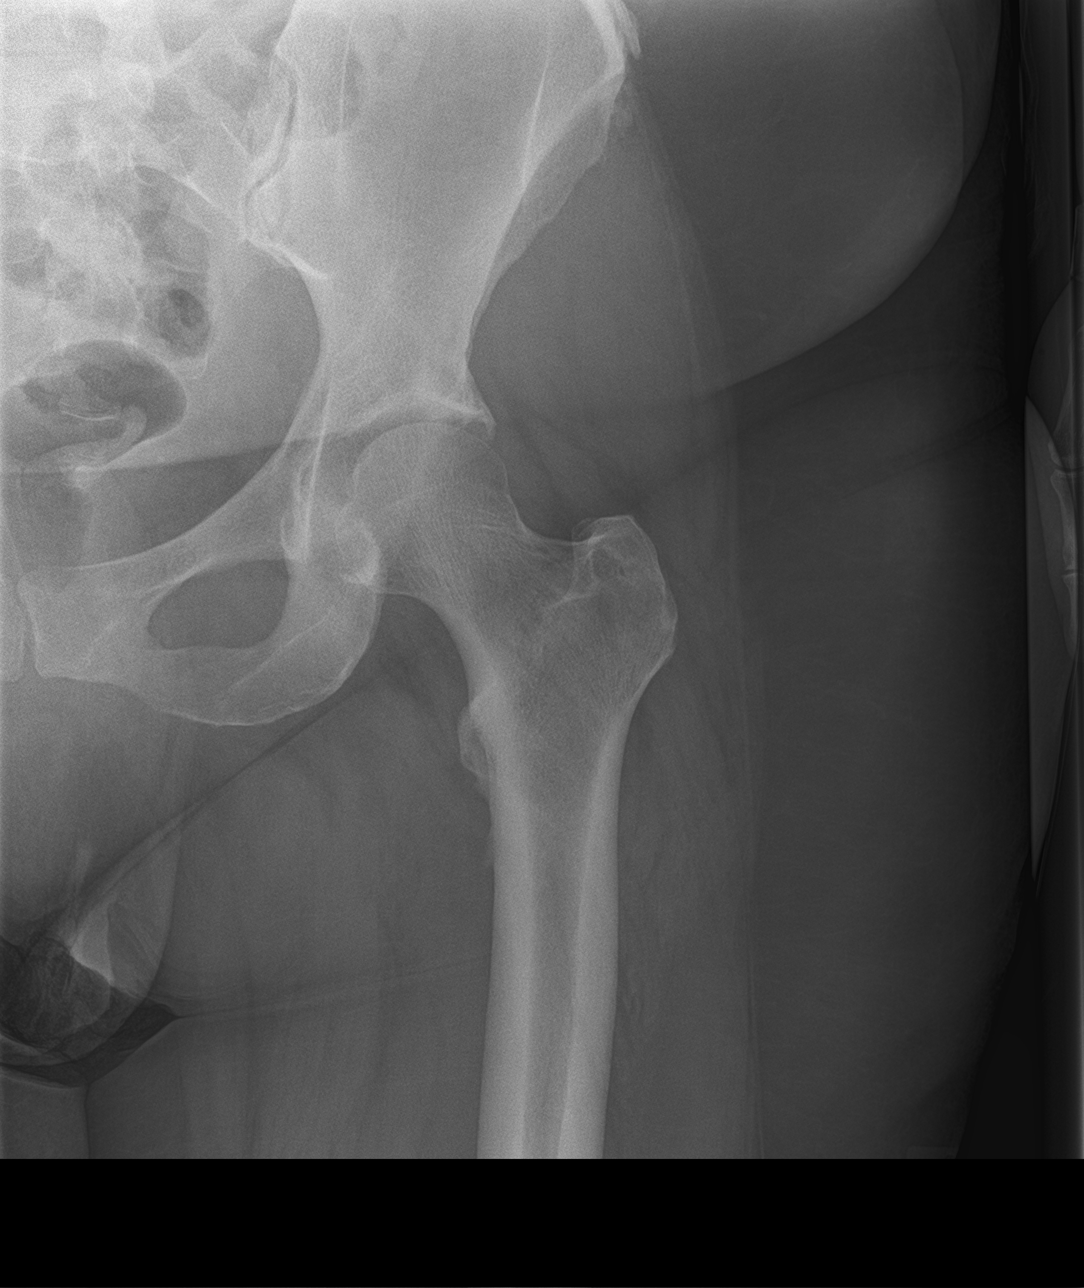
[im 3/3]
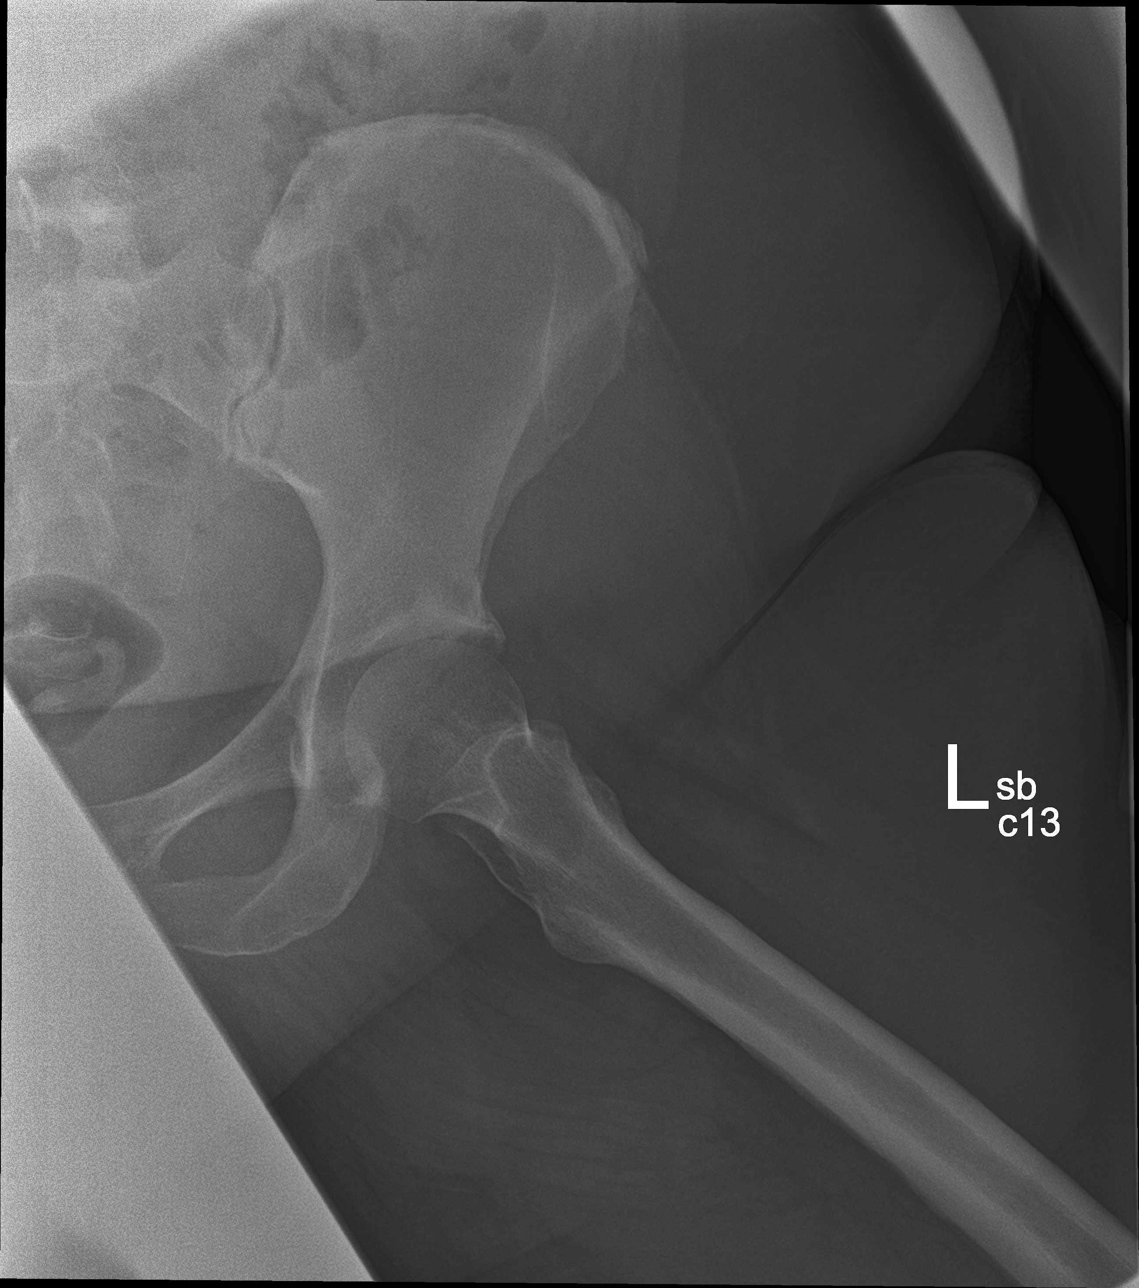

[3 of 3 positions shown; findings below may reference images not displayed]

FINDINGS: There is no evidence of hip fracture or dislocation. There is a
small amount of periosteal reaction along the medial left femoral
just inferior to the lesser trochanter likely related to adductor
muscle insertion stress. No focal bone lesion. Regional soft tissues
are unremarkable.
IMPRESSION: No acute fracture. Mild periosteal reaction along the medial
proximal left femur may be related to adductor muscle insertion
stress.

## 2019-09-17 NOTE — Telephone Encounter (Signed)
Called patient to let her know that paperwork is ready for pick up. Patient stated she will come by today to pick it up.

## 2019-09-20 ENCOUNTER — Encounter: Payer: Self-pay | Admitting: Family Medicine

## 2019-09-23 ENCOUNTER — Encounter: Payer: Self-pay | Admitting: *Deleted

## 2019-09-30 ENCOUNTER — Encounter: Payer: Self-pay | Admitting: Family Medicine

## 2019-10-01 ENCOUNTER — Encounter: Payer: Self-pay | Admitting: Family Medicine

## 2019-10-03 ENCOUNTER — Other Ambulatory Visit: Payer: Self-pay | Admitting: Family Medicine

## 2019-10-03 DIAGNOSIS — M25552 Pain in left hip: Secondary | ICD-10-CM

## 2019-10-12 ENCOUNTER — Ambulatory Visit: Payer: BC Managed Care – PPO | Attending: Internal Medicine

## 2019-10-12 DIAGNOSIS — Z23 Encounter for immunization: Secondary | ICD-10-CM

## 2019-10-12 NOTE — Progress Notes (Signed)
   Covid-19 Vaccination Clinic  Name:  Kimberly Santiago    MRN: [id number] DOB: 1976/07/01  10/12/2019  Ms. Ems was observed post Covid-19 immunization for 15 minutes without incident. She was provided with Vaccine Information Sheet and instruction to access the V-Safe system.   [name] was instructed to call 911 with any severe reactions post vaccine: Marland Kitchen Difficulty breathing  . Swelling of face and throat  . A fast heartbeat  . A bad rash all over body  . Dizziness and weakness   Immunizations Administered    Name Date Dose VIS Date Route   Moderna COVID-19 Vaccine 10/12/2019  9:47 AM 0.5 mL 06/01/2019 Intramuscular   Manufacturer: Moderna   Lot: [id number]   NDC: 03754-360-67

## 2019-10-13 DIAGNOSIS — M7062 Trochanteric bursitis, left hip: Secondary | ICD-10-CM | POA: Diagnosis not present

## 2019-10-28 DIAGNOSIS — M7062 Trochanteric bursitis, left hip: Secondary | ICD-10-CM | POA: Diagnosis not present

## 2019-11-18 DIAGNOSIS — R490 Dysphonia: Secondary | ICD-10-CM | POA: Diagnosis not present

## 2019-11-18 DIAGNOSIS — J0100 Acute maxillary sinusitis, unspecified: Secondary | ICD-10-CM | POA: Diagnosis not present

## 2019-11-18 DIAGNOSIS — K219 Gastro-esophageal reflux disease without esophagitis: Secondary | ICD-10-CM | POA: Diagnosis not present

## 2019-12-08 DIAGNOSIS — R918 Other nonspecific abnormal finding of lung field: Secondary | ICD-10-CM | POA: Diagnosis not present

## 2019-12-08 DIAGNOSIS — E669 Obesity, unspecified: Secondary | ICD-10-CM | POA: Diagnosis not present

## 2019-12-08 DIAGNOSIS — J189 Pneumonia, unspecified organism: Secondary | ICD-10-CM | POA: Diagnosis not present

## 2019-12-08 DIAGNOSIS — Z9049 Acquired absence of other specified parts of digestive tract: Secondary | ICD-10-CM | POA: Diagnosis not present

## 2019-12-08 DIAGNOSIS — R509 Fever, unspecified: Secondary | ICD-10-CM | POA: Diagnosis not present

## 2019-12-08 DIAGNOSIS — R0602 Shortness of breath: Secondary | ICD-10-CM | POA: Diagnosis not present

## 2019-12-08 DIAGNOSIS — Z6841 Body Mass Index (BMI) 40.0 and over, adult: Secondary | ICD-10-CM | POA: Diagnosis not present

## 2019-12-08 DIAGNOSIS — Z20822 Contact with and (suspected) exposure to covid-19: Secondary | ICD-10-CM | POA: Diagnosis not present

## 2019-12-08 DIAGNOSIS — Z9884 Bariatric surgery status: Secondary | ICD-10-CM | POA: Diagnosis not present

## 2019-12-08 DIAGNOSIS — R5383 Other fatigue: Secondary | ICD-10-CM | POA: Diagnosis not present

## 2019-12-08 DIAGNOSIS — J4520 Mild intermittent asthma, uncomplicated: Secondary | ICD-10-CM | POA: Diagnosis not present

## 2019-12-26 DIAGNOSIS — G4733 Obstructive sleep apnea (adult) (pediatric): Secondary | ICD-10-CM | POA: Diagnosis not present

## 2020-01-13 ENCOUNTER — Other Ambulatory Visit: Payer: Self-pay | Admitting: Otolaryngology

## 2020-01-13 ENCOUNTER — Ambulatory Visit
Admission: RE | Admit: 2020-01-13 | Discharge: 2020-01-13 | Disposition: A | Discharge status: Home / Self Care | Payer: BC Managed Care – PPO | Attending: Otolaryngology | Admitting: Otolaryngology

## 2020-01-13 ENCOUNTER — Ambulatory Visit
Admission: RE | Admit: 2020-01-13 | Discharge: 2020-01-13 | Disposition: A | Discharge status: Home / Self Care | Payer: BC Managed Care – PPO | Source: Ambulatory Visit | Attending: Otolaryngology | Admitting: Otolaryngology

## 2020-01-13 DIAGNOSIS — R059 Cough, unspecified: Secondary | ICD-10-CM

## 2020-01-13 DIAGNOSIS — R0600 Dyspnea, unspecified: Secondary | ICD-10-CM | POA: Diagnosis not present

## 2020-01-13 DIAGNOSIS — R05 Cough: Secondary | ICD-10-CM | POA: Diagnosis not present

## 2020-01-13 DIAGNOSIS — J324 Chronic pansinusitis: Secondary | ICD-10-CM | POA: Diagnosis not present

## 2020-01-13 IMAGING — CR DG CHEST 2V
2 series · 2 of 2 positions shown · non-contrast
Comparison: [DATE]

CLINICAL DATA: Cough, mid chest pain, dyspnea on exertion

EXAM:
CHEST - 2 VIEW

[chest pa]
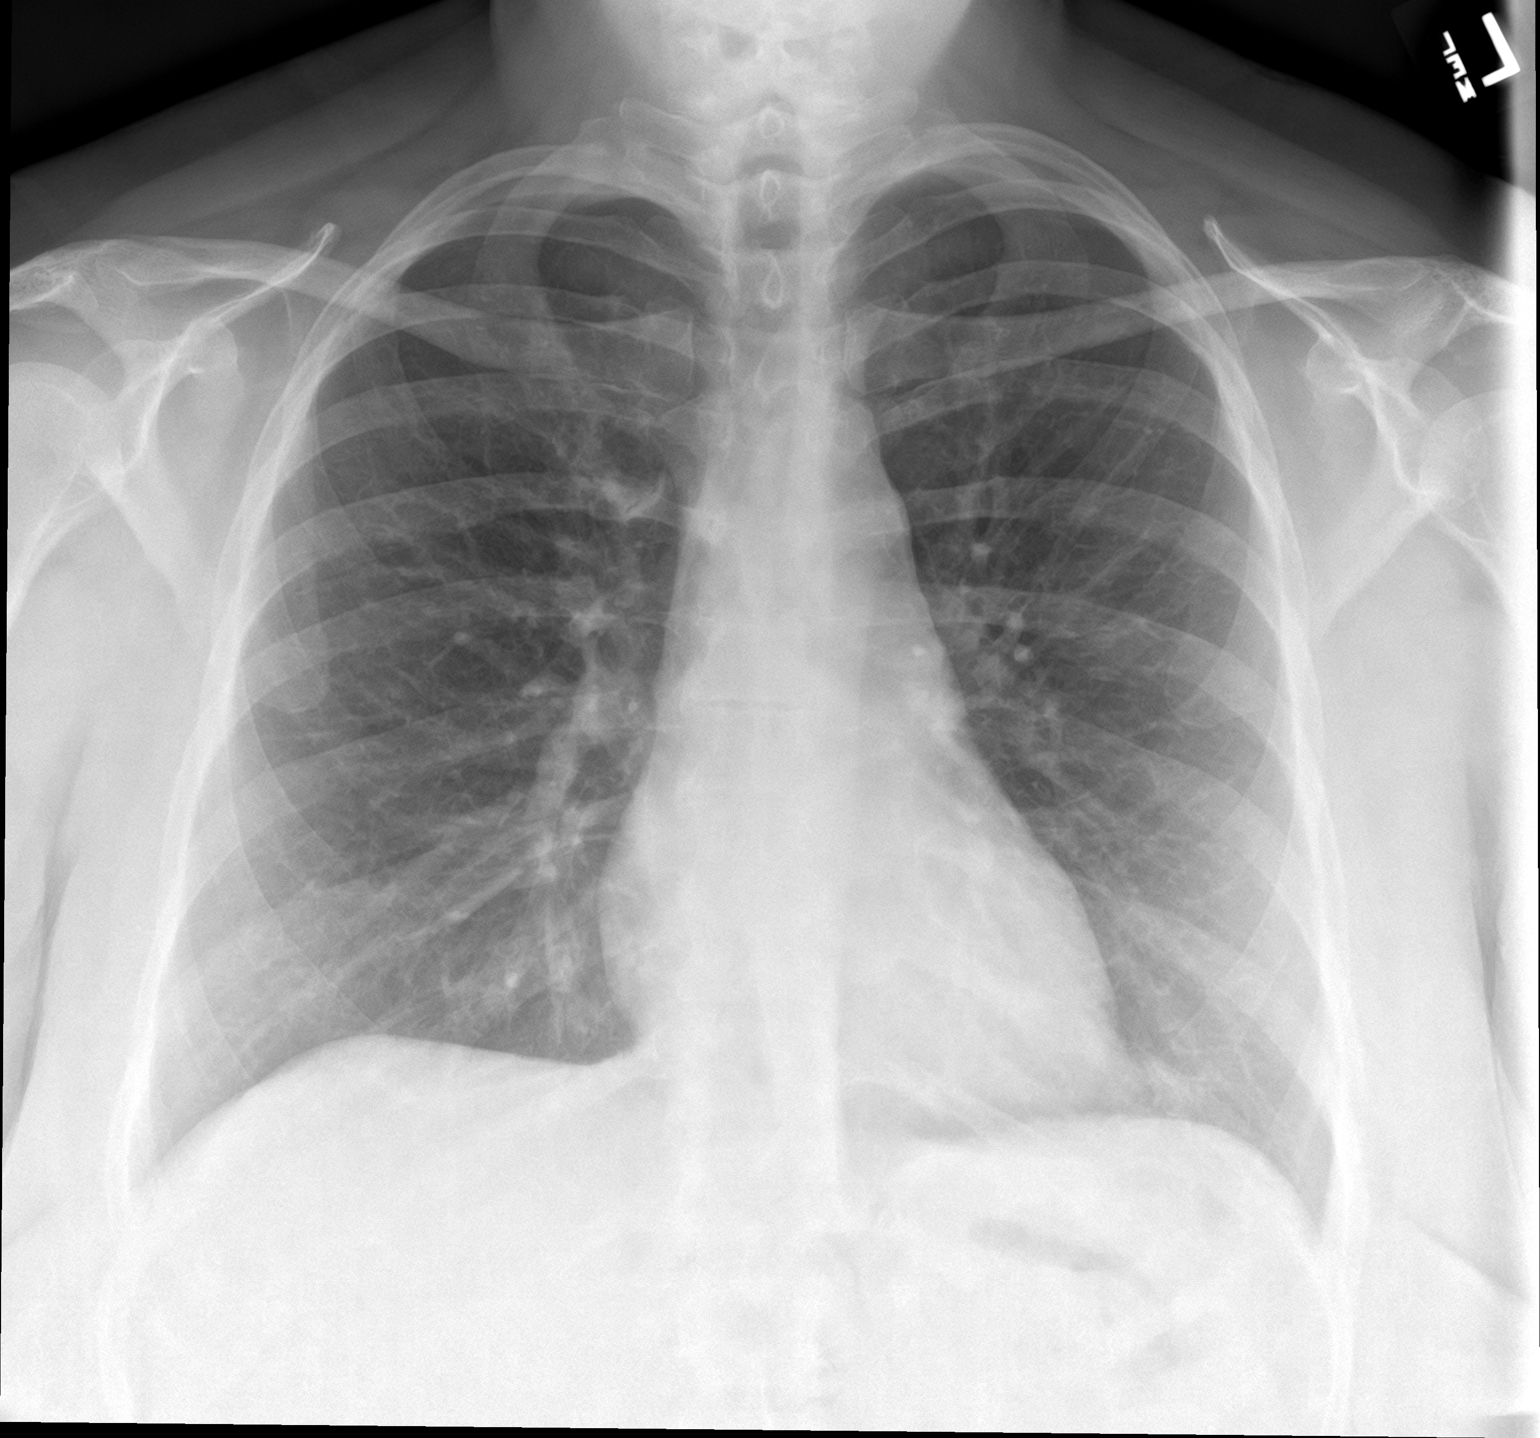

[chest lat]
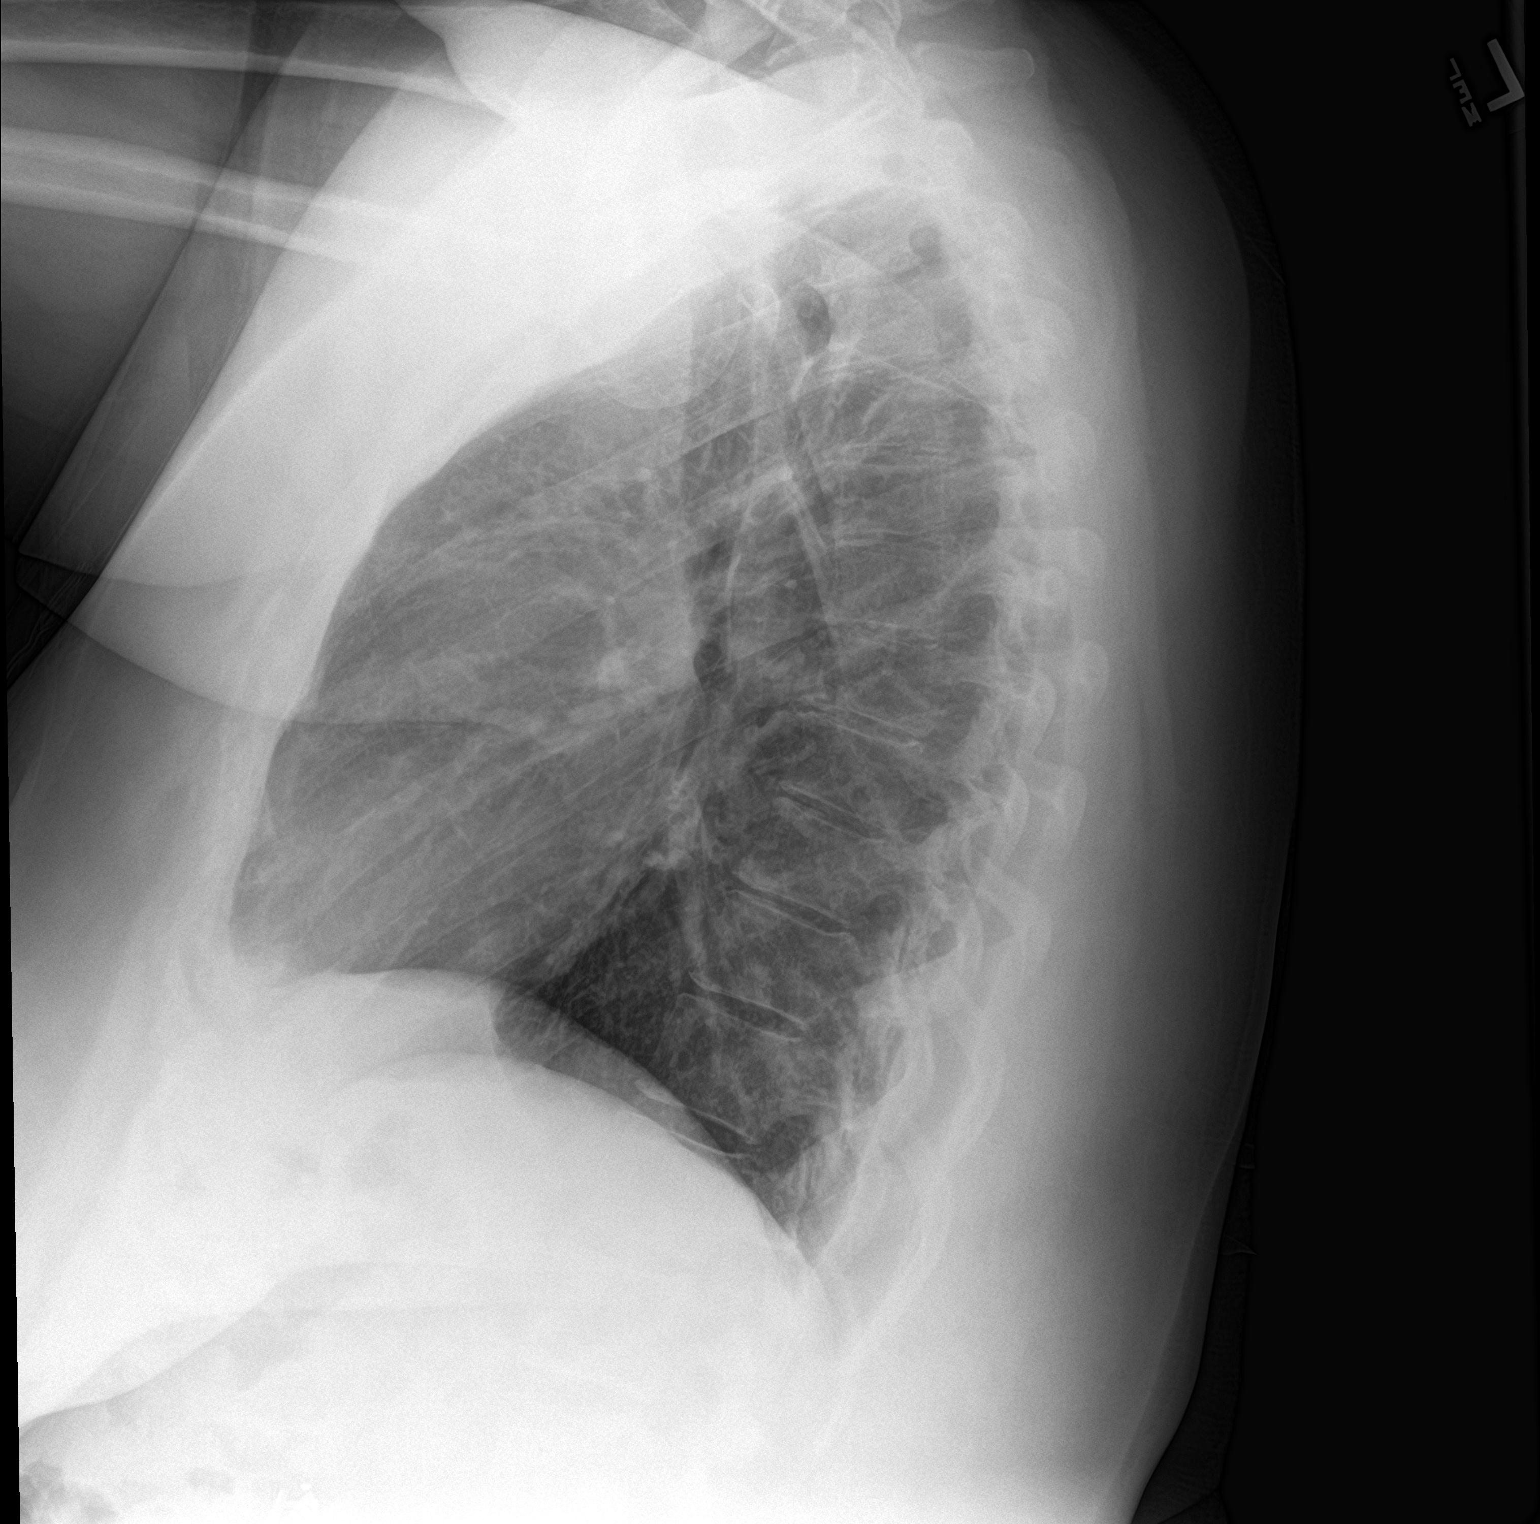

[2 of 2 positions shown; findings below may reference images not displayed]

FINDINGS: The heart size and mediastinal contours are within normal limits.
Both lungs are clear. The visualized skeletal structures are
unremarkable.
IMPRESSION: No active cardiopulmonary disease.

## 2020-02-16 ENCOUNTER — Encounter: Payer: Self-pay | Admitting: Family Medicine

## 2020-02-22 ENCOUNTER — Other Ambulatory Visit: Payer: Self-pay | Admitting: Family Medicine

## 2020-02-22 DIAGNOSIS — Z1231 Encounter for screening mammogram for malignant neoplasm of breast: Secondary | ICD-10-CM

## 2020-03-05 ENCOUNTER — Other Ambulatory Visit: Payer: Self-pay | Admitting: Family Medicine

## 2020-03-08 ENCOUNTER — Ambulatory Visit
Admission: RE | Admit: 2020-03-08 | Discharge: 2020-03-08 | Disposition: A | Discharge status: Home / Self Care | Payer: BC Managed Care – PPO | Source: Ambulatory Visit | Attending: Family Medicine | Admitting: Family Medicine

## 2020-03-08 ENCOUNTER — Other Ambulatory Visit: Payer: Self-pay

## 2020-03-08 DIAGNOSIS — Z1231 Encounter for screening mammogram for malignant neoplasm of breast: Secondary | ICD-10-CM | POA: Diagnosis not present

## 2020-03-08 IMAGING — MG DIGITAL SCREENING BILAT W/ TOMO W/ CAD
6 of 10 series · 6 of 30 positions shown · non-contrast
Comparison: Previous exam(s).

CLINICAL DATA: Screening.

EXAM:
DIGITAL SCREENING BILATERAL MAMMOGRAM WITH TOMO AND CAD

[R CC synth-2D]
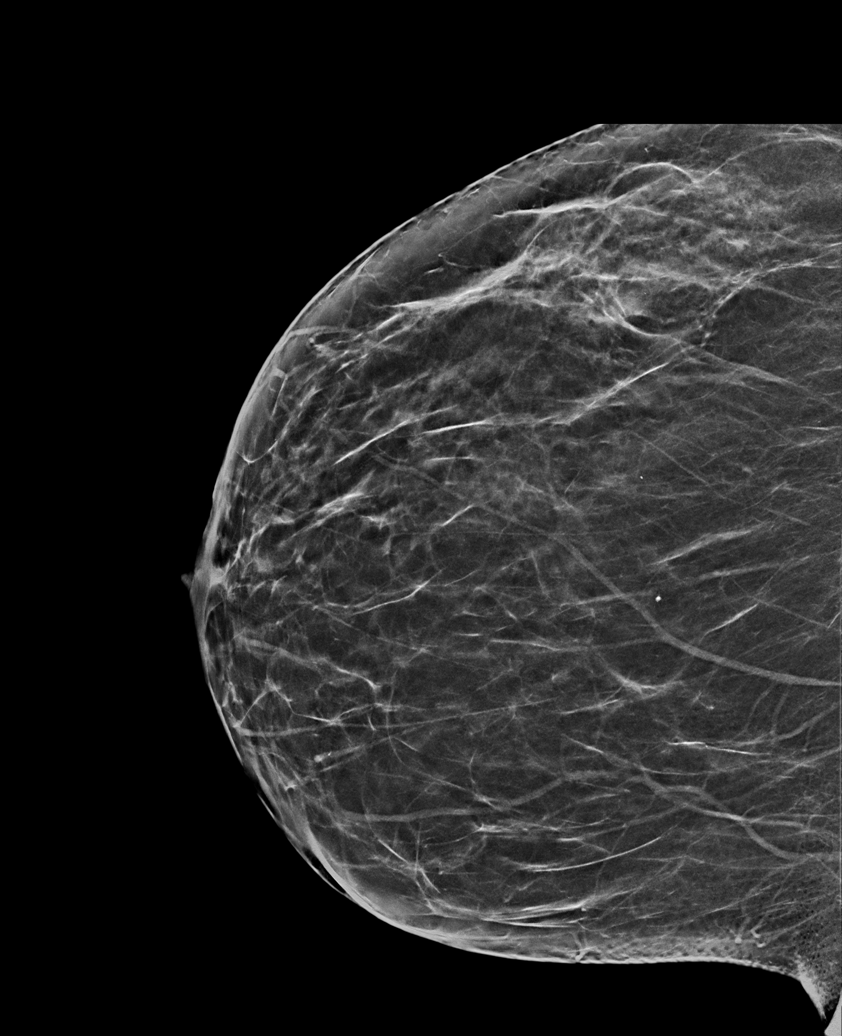

[R MLO synth-2D]
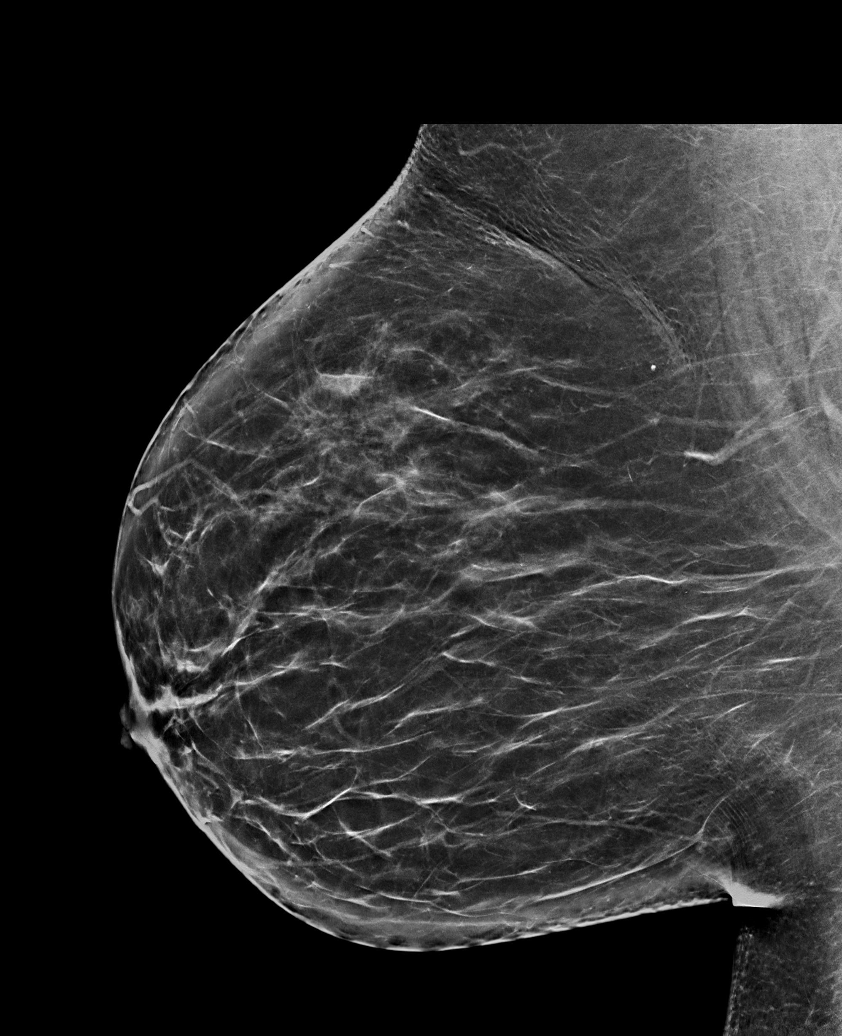

[L MLO synth-2D (1 of 2)]
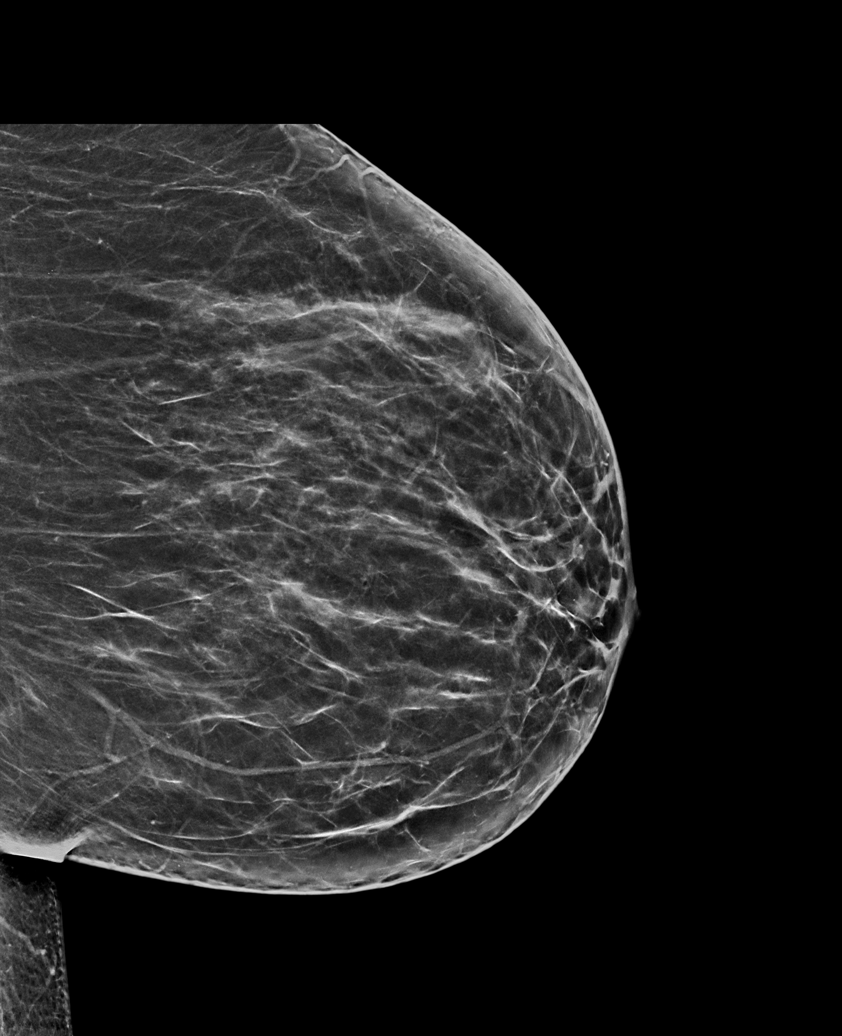

[L MLO synth-2D (2 of 2)]
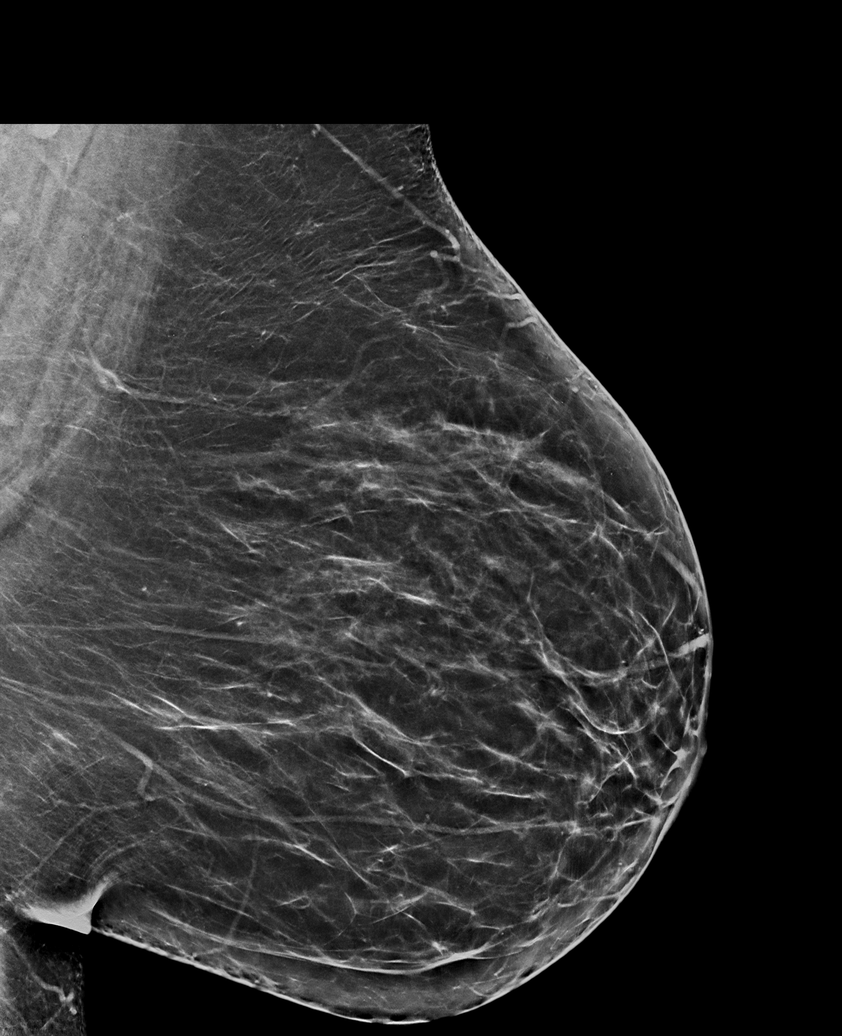

[L CC synth-2D]
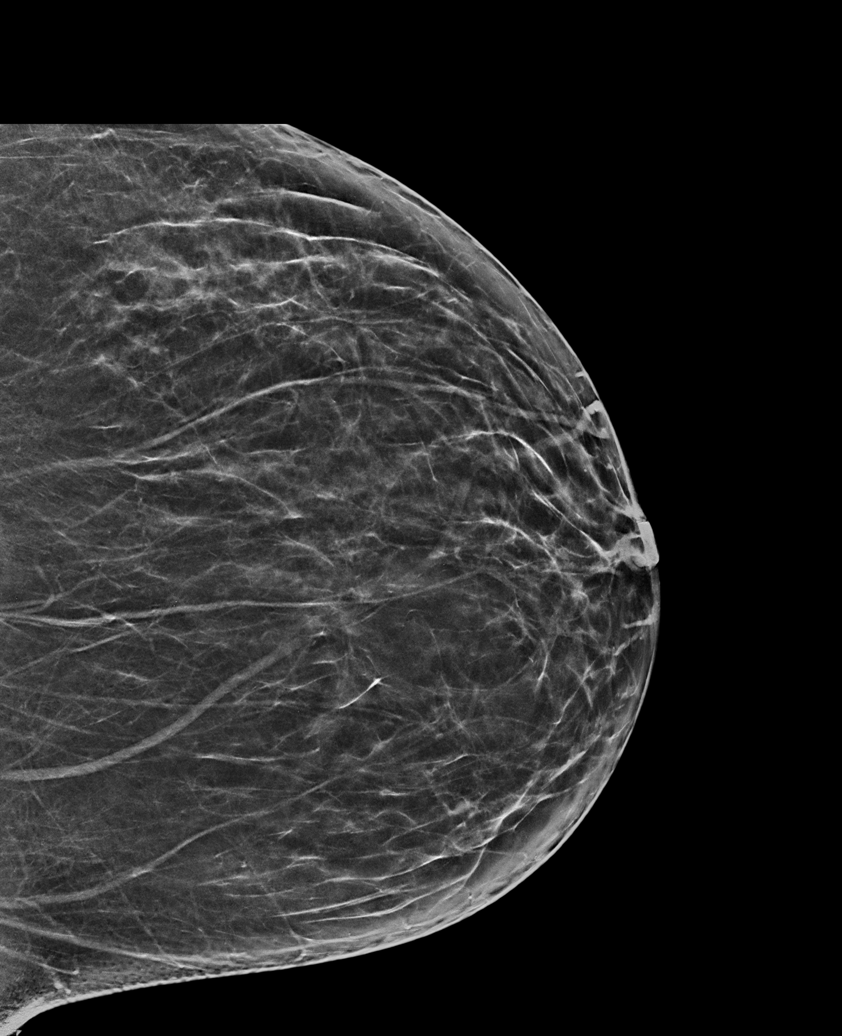

[L MLO tomo · tomo slice 38/75.0]
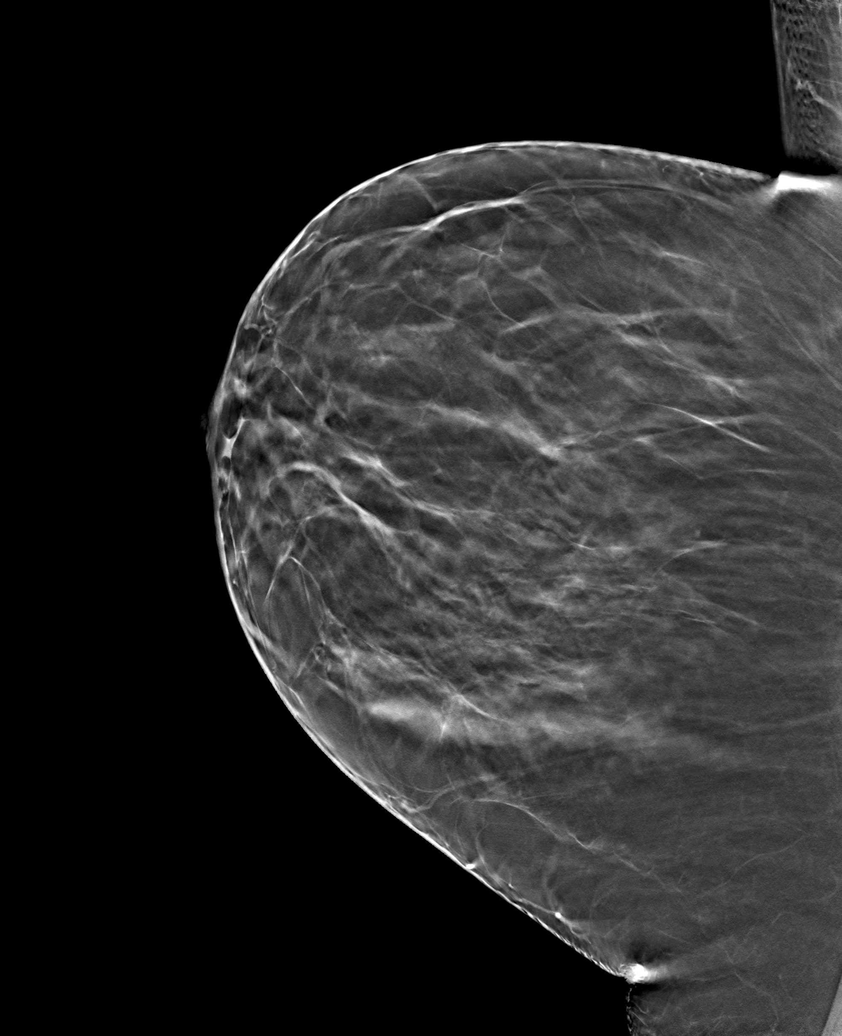

[6 of 30 positions shown; findings below may reference images not displayed]

ACR Breast Density Category b: There are scattered areas of
fibroglandular density.
FINDINGS: There are no findings suspicious for malignancy. Images were
processed with CAD.
IMPRESSION: No mammographic evidence of malignancy. A result letter of this
screening mammogram will be mailed directly to the patient.

RECOMMENDATION:
Screening mammogram in one year. (Code:[TQ])

BI-RADS CATEGORY  1: Negative.

## 2020-03-09 ENCOUNTER — Telehealth (INDEPENDENT_AMBULATORY_CARE_PROVIDER_SITE_OTHER): Payer: BC Managed Care – PPO | Admitting: Family Medicine

## 2020-03-09 ENCOUNTER — Encounter: Payer: Self-pay | Admitting: Family Medicine

## 2020-03-09 VITALS — BP 98/65 | HR 71 | Temp 98.7°F | Wt 285.4 lb

## 2020-03-09 DIAGNOSIS — Z9884 Bariatric surgery status: Secondary | ICD-10-CM | POA: Diagnosis not present

## 2020-03-09 DIAGNOSIS — R635 Abnormal weight gain: Secondary | ICD-10-CM | POA: Diagnosis not present

## 2020-03-09 DIAGNOSIS — R0600 Dyspnea, unspecified: Secondary | ICD-10-CM

## 2020-03-09 DIAGNOSIS — R0609 Other forms of dyspnea: Secondary | ICD-10-CM

## 2020-03-09 DIAGNOSIS — Z13220 Encounter for screening for lipoid disorders: Secondary | ICD-10-CM | POA: Diagnosis not present

## 2020-03-09 DIAGNOSIS — Z8701 Personal history of pneumonia (recurrent): Secondary | ICD-10-CM

## 2020-03-09 DIAGNOSIS — R5382 Chronic fatigue, unspecified: Secondary | ICD-10-CM

## 2020-03-09 DIAGNOSIS — G4733 Obstructive sleep apnea (adult) (pediatric): Secondary | ICD-10-CM

## 2020-03-09 LAB — BAYER DCA HB A1C WAIVED: HB A1C (BAYER DCA - WAIVED): 4.8 % (ref <7.0)

## 2020-03-09 NOTE — Assessment & Plan Note (Signed)
Not severe enough for CPAP. Looking into mouth pieces. Encouraged to follow up on this.

## 2020-03-09 NOTE — Progress Notes (Signed)
LMP 03/04/2020    Subjective:    Patient ID: Kimberly Santiago, female    DOB: 1976-02-10, 44 y.o.   MRN: [id number]  HPI: Kimberly Santiago is a 44 y.o. female  Chief Complaint  Patient presents with  . Fatigue    pt states she has been feeling fatigued for the last 2 months. States she feels like she can just go to sleep at any time.   . Weight Gain    pt states she does not know her weight, but knows since she had her baby in January that she has gain weight. She states that she feels like she never even had gastric bypass surgery.    FATIGUE- has a 2.43yo and a 32mo, she has mild OSA, but not bad enough to need to have a CPAP.  Duration:  2 months Severity: severe  Onset: gradual Context when symptoms started:  had pneumonia in June- has not been better since then. Symptoms improve with rest: no  Depressive symptoms: no Stress/anxiety: yes- just left a very stressful job Insomnia: no  Snoring: no Observed apnea by bed partner: no Daytime hypersomnolence:yes Wakes feeling refreshed: no History of sleep study: yes Dysnea on exertion:  yes Orthopnea/PND: no Chest pain: no Chronic cough: no Lower extremity edema: no Arthralgias:yes Myalgias: yes Weakness: no Rash: no  WEIGHT GAIN- hasn't been able to exercise due to hip pain and has been eating a lot more, appetite has just been off the chart Duration: chronic Previous attempts at weight loss: yes Peak weight: almost 400, down to 220, now back up to about 280  Relevant past medical, surgical, family and social history reviewed and updated as indicated. Interim medical history since our last visit reviewed. Allergies and medications reviewed and updated.  Review of Systems  Constitutional: Positive for appetite change, fatigue and unexpected weight change. Negative for activity change, chills, diaphoresis and fever.  HENT: Negative.   Respiratory: Positive for chest tightness and shortness of breath. Negative for apnea,  cough, choking, wheezing and stridor.   Cardiovascular: Negative.   Gastrointestinal: Negative.   Musculoskeletal: Positive for arthralgias. Negative for back pain, gait problem, joint swelling, myalgias, neck pain and neck stiffness.  Skin: Negative.   Neurological: Negative.   Psychiatric/Behavioral: Negative.     Per HPI unless specifically indicated above     Objective:    LMP 03/04/2020   Wt Readings from Last 3 Encounters:  09/16/19 262 lb (118.8 kg)  08/23/19 255 lb (115.7 kg)  07/27/19 273 lb 4.8 oz (124 kg)    Physical Exam Vitals and nursing note reviewed.  Constitutional:      General: She is not in acute distress.    Appearance: Normal appearance. She is not ill-appearing, toxic-appearing or diaphoretic.  HENT:     Head: Normocephalic and atraumatic.     Right Ear: External ear normal.     Left Ear: External ear normal.     Nose: Nose normal.     Mouth/Throat:     Mouth: Mucous membranes are moist.     Pharynx: Oropharynx is clear.  Eyes:     General: No scleral icterus.       Right eye: No discharge.        Left eye: No discharge.     Conjunctiva/sclera: Conjunctivae normal.     Pupils: Pupils are equal, round, and reactive to light.  Pulmonary:     Effort: Pulmonary effort is normal. No respiratory distress.  Comments: Speaking in full sentences Musculoskeletal:        General: Normal range of motion.     Cervical back: Normal range of motion.  Skin:    Coloration: Skin is not jaundiced or pale.     Findings: No bruising, erythema, lesion or rash.  Neurological:     Mental Status: She is alert and oriented to person, place, and time. Mental status is at baseline.  Psychiatric:        Mood and Affect: Mood normal.        Behavior: Behavior normal.        Thought Content: Thought content normal.        Judgment: Judgment normal.     Results for orders placed or performed in visit on 09/16/19  CBC with Differential/Platelet  Result Value Ref  Range   WBC 5.5 3.4 - 10.8 x10E3/uL   RBC 4.35 3.77 - 5.28 x10E6/uL   Hemoglobin 12.8 11.1 - 15.9 g/dL   Hematocrit 00.8 67.6 - 46.6 %   MCV 88 79 - 97 fL   MCH 29.4 26.6 - 33.0 pg   MCHC 33.6 31 - 35 g/dL   RDW 19.5 09.3 - 26.7 %   Platelets 275 150 - 450 x10E3/uL   Neutrophils 53 Not Estab. %   Lymphs 29 Not Estab. %   Monocytes 10 Not Estab. %   Eos 6 Not Estab. %   Basos 2 Not Estab. %   Neutrophils Absolute 2.9 1 - 7 x10E3/uL   Lymphocytes Absolute 1.6 0 - 3 x10E3/uL   Monocytes Absolute 0.6 0 - 0 x10E3/uL   EOS (ABSOLUTE) 0.3 0.0 - 0.4 x10E3/uL   Basophils Absolute 0.1 0 - 0 x10E3/uL   Immature Granulocytes 0 Not Estab. %   Immature Grans (Abs) 0.0 0.0 - 0.1 x10E3/uL  Comprehensive metabolic panel  Result Value Ref Range   Glucose 76 65 - 99 mg/dL   BUN 9 6 - 24 mg/dL   Creatinine, Ser 1.24 0.57 - 1.00 mg/dL   GFR calc non Af Amer 107 >59 mL/min/1.73   GFR calc Af Amer 124 >59 mL/min/1.73   BUN/Creatinine Ratio 13 9 - 23   Sodium 141 134 - 144 mmol/L   Potassium 3.9 3.5 - 5.2 mmol/L   Chloride 104 96 - 106 mmol/L   CO2 26 20 - 29 mmol/L   Calcium 8.6 (L) 8.7 - 10.2 mg/dL   Total Protein 5.7 (L) 6.0 - 8.5 g/dL   Albumin 3.6 (L) 3.8 - 4.8 g/dL   Globulin, Total 2.1 1.5 - 4.5 g/dL   Albumin/Globulin Ratio 1.7 1.2 - 2.2   Bilirubin Total 0.3 0.0 - 1.2 mg/dL   Alkaline Phosphatase 83 39 - 117 IU/L   AST 22 0 - 40 IU/L   ALT 26 0 - 32 IU/L  Lipid Panel w/o Chol/HDL Ratio  Result Value Ref Range   Cholesterol, Total 157 100 - 199 mg/dL   Triglycerides 45 0 - 149 mg/dL   HDL 74 >58 mg/dL   VLDL Cholesterol Cal 10 5 - 40 mg/dL   LDL Chol Calc (NIH) 73 0 - 99 mg/dL  TSH  Result Value Ref Range   TSH 2.770 0.450 - 4.500 uIU/mL  UA/M w/rflx Culture, Routine   Specimen: Urine   URINE  Result Value Ref Range   Specific Gravity, UA 1.020 1.005 - 1.030   pH, UA 6.0 5.0 - 7.5   Color, UA Yellow Yellow   Appearance Ur  Clear Clear   Leukocytes,UA Negative Negative    Protein,UA Negative Negative/Trace   Glucose, UA Negative Negative   Ketones, UA Negative Negative   RBC, UA Negative Negative   Bilirubin, UA Negative Negative   Urobilinogen, Ur 1.0 0.2 - 1.0 mg/dL   Nitrite, UA Negative Negative      Assessment & Plan:   Problem List Items Addressed This Visit      Respiratory   OSA (obstructive sleep apnea)    Not severe enough for CPAP. Looking into mouth pieces. Encouraged to follow up on this.         Other   Status post bariatric surgery    Will check labs. Await results.        Other Visit Diagnoses    Chronic fatigue    -  Primary   Will check blood work. Await results. Will look into her mouth guard for her OSA. Continue to monitor.    Relevant Orders   CBC with Differential/Platelet   Comprehensive metabolic panel   TSH   VITAMIN D 25 Hydroxy (Vit-D Deficiency, Fractures)   Lyme Ab/Western Blot Reflex   Rocky mtn spotted fvr abs pnl(IgG+IgM)   Babesia microti Antibody Panel   Ehrlichia Antibody Panel   Weight gain       Likely multifactorial- will check labs. Continue to monitor. Call with any concerns.    Relevant Orders   Comprehensive metabolic panel   TSH   Screening for cholesterol level       Labs drawn today. Await results.    Relevant Orders   Lipid Panel w/o Chol/HDL Ratio   History of pneumonia       Will check spirometry today.    History of gastric bypass       Relevant Orders   B12 and Folate Panel   Bayer DCA Hb A1c Waived   Vitamin K1, Serum   Iron and TIBC   Ferritin   Dyspnea on exertion       Will check spirometry today and labs. Await results.    Relevant Orders   Spirometry with graph       Follow up plan: Return in about 4 weeks (around 04/06/2020).    . This visit was completed via MyChart due to the restrictions of the COVID-19 pandemic. All issues as above were discussed and addressed. Physical exam was done as above through visual confirmation on MyChart. If it was felt that the  patient should be evaluated in the office, they were directed there. The patient verbally consented to this visit. . Location of the patient: parking lot . Location of the provider: home . Those involved with this call:  . Provider: Olevia Perches, DO . CMA: Wilhemena Durie, CMA . Front Desk/Registration: Adela Ports  . Time spent on call: 25 minutes with patient face to face via video conference. More than 50% of this time was spent in counseling and coordination of care. 40 minutes total spent in review of patient's record and preparation of their chart.

## 2020-03-09 NOTE — Assessment & Plan Note (Signed)
Will check labs. Await results.  

## 2020-03-10 DIAGNOSIS — M7062 Trochanteric bursitis, left hip: Secondary | ICD-10-CM | POA: Diagnosis not present

## 2020-03-12 ENCOUNTER — Ambulatory Visit
Admission: EM | Admit: 2020-03-12 | Discharge: 2020-03-12 | Disposition: A | Discharge status: Other Acute Inpatient Hospital | Payer: BC Managed Care – PPO | Attending: Emergency Medicine | Admitting: Emergency Medicine

## 2020-03-12 ENCOUNTER — Encounter: Payer: Self-pay | Admitting: Emergency Medicine

## 2020-03-12 ENCOUNTER — Other Ambulatory Visit: Payer: Self-pay

## 2020-03-12 DIAGNOSIS — N8302 Follicular cyst of left ovary: Secondary | ICD-10-CM | POA: Diagnosis not present

## 2020-03-12 DIAGNOSIS — G4733 Obstructive sleep apnea (adult) (pediatric): Secondary | ICD-10-CM | POA: Diagnosis not present

## 2020-03-12 DIAGNOSIS — Z886 Allergy status to analgesic agent status: Secondary | ICD-10-CM | POA: Insufficient documentation

## 2020-03-12 DIAGNOSIS — D649 Anemia, unspecified: Secondary | ICD-10-CM | POA: Diagnosis not present

## 2020-03-12 DIAGNOSIS — Z9884 Bariatric surgery status: Secondary | ICD-10-CM | POA: Diagnosis not present

## 2020-03-12 DIAGNOSIS — F329 Major depressive disorder, single episode, unspecified: Secondary | ICD-10-CM | POA: Diagnosis not present

## 2020-03-12 DIAGNOSIS — Z885 Allergy status to narcotic agent status: Secondary | ICD-10-CM | POA: Insufficient documentation

## 2020-03-12 DIAGNOSIS — E669 Obesity, unspecified: Secondary | ICD-10-CM | POA: Diagnosis not present

## 2020-03-12 DIAGNOSIS — R1010 Upper abdominal pain, unspecified: Secondary | ICD-10-CM | POA: Diagnosis not present

## 2020-03-12 DIAGNOSIS — Z20822 Contact with and (suspected) exposure to covid-19: Secondary | ICD-10-CM | POA: Diagnosis not present

## 2020-03-12 DIAGNOSIS — N809 Endometriosis, unspecified: Secondary | ICD-10-CM | POA: Diagnosis not present

## 2020-03-12 DIAGNOSIS — R1084 Generalized abdominal pain: Secondary | ICD-10-CM | POA: Diagnosis not present

## 2020-03-12 DIAGNOSIS — R197 Diarrhea, unspecified: Secondary | ICD-10-CM | POA: Diagnosis not present

## 2020-03-12 DIAGNOSIS — K529 Noninfective gastroenteritis and colitis, unspecified: Secondary | ICD-10-CM | POA: Diagnosis not present

## 2020-03-12 DIAGNOSIS — K2270 Barrett's esophagus without dysplasia: Secondary | ICD-10-CM | POA: Diagnosis not present

## 2020-03-12 DIAGNOSIS — R1013 Epigastric pain: Secondary | ICD-10-CM | POA: Diagnosis not present

## 2020-03-12 DIAGNOSIS — G8911 Acute pain due to trauma: Secondary | ICD-10-CM | POA: Diagnosis not present

## 2020-03-12 DIAGNOSIS — M6250 Muscle wasting and atrophy, not elsewhere classified, unspecified site: Secondary | ICD-10-CM | POA: Diagnosis not present

## 2020-03-12 DIAGNOSIS — Z79899 Other long term (current) drug therapy: Secondary | ICD-10-CM | POA: Diagnosis not present

## 2020-03-12 DIAGNOSIS — N838 Other noninflammatory disorders of ovary, fallopian tube and broad ligament: Secondary | ICD-10-CM | POA: Diagnosis not present

## 2020-03-12 DIAGNOSIS — Z880 Allergy status to penicillin: Secondary | ICD-10-CM | POA: Diagnosis not present

## 2020-03-12 DIAGNOSIS — K219 Gastro-esophageal reflux disease without esophagitis: Secondary | ICD-10-CM | POA: Diagnosis not present

## 2020-03-12 DIAGNOSIS — R110 Nausea: Secondary | ICD-10-CM | POA: Diagnosis not present

## 2020-03-12 DIAGNOSIS — R7881 Bacteremia: Secondary | ICD-10-CM | POA: Diagnosis not present

## 2020-03-12 DIAGNOSIS — Z7982 Long term (current) use of aspirin: Secondary | ICD-10-CM | POA: Diagnosis not present

## 2020-03-12 DIAGNOSIS — R531 Weakness: Secondary | ICD-10-CM | POA: Diagnosis not present

## 2020-03-12 DIAGNOSIS — A492 Hemophilus influenzae infection, unspecified site: Secondary | ICD-10-CM | POA: Diagnosis not present

## 2020-03-12 DIAGNOSIS — Z6841 Body Mass Index (BMI) 40.0 and over, adult: Secondary | ICD-10-CM | POA: Diagnosis not present

## 2020-03-12 DIAGNOSIS — B963 Hemophilus influenzae [H. influenzae] as the cause of diseases classified elsewhere: Secondary | ICD-10-CM | POA: Diagnosis not present

## 2020-03-12 DIAGNOSIS — F419 Anxiety disorder, unspecified: Secondary | ICD-10-CM | POA: Diagnosis not present

## 2020-03-12 DIAGNOSIS — J90 Pleural effusion, not elsewhere classified: Secondary | ICD-10-CM | POA: Diagnosis not present

## 2020-03-12 DIAGNOSIS — R112 Nausea with vomiting, unspecified: Secondary | ICD-10-CM | POA: Diagnosis not present

## 2020-03-12 DIAGNOSIS — Z792 Long term (current) use of antibiotics: Secondary | ICD-10-CM | POA: Diagnosis not present

## 2020-03-12 DIAGNOSIS — R109 Unspecified abdominal pain: Secondary | ICD-10-CM | POA: Diagnosis not present

## 2020-03-12 DIAGNOSIS — A084 Viral intestinal infection, unspecified: Secondary | ICD-10-CM | POA: Diagnosis not present

## 2020-03-12 DIAGNOSIS — R519 Headache, unspecified: Secondary | ICD-10-CM | POA: Diagnosis not present

## 2020-03-12 DIAGNOSIS — J45909 Unspecified asthma, uncomplicated: Secondary | ICD-10-CM | POA: Insufficient documentation

## 2020-03-12 DIAGNOSIS — E282 Polycystic ovarian syndrome: Secondary | ICD-10-CM | POA: Insufficient documentation

## 2020-03-12 DIAGNOSIS — R1012 Left upper quadrant pain: Secondary | ICD-10-CM | POA: Diagnosis not present

## 2020-03-12 DIAGNOSIS — N83201 Unspecified ovarian cyst, right side: Secondary | ICD-10-CM | POA: Diagnosis not present

## 2020-03-12 DIAGNOSIS — R1909 Other intra-abdominal and pelvic swelling, mass and lump: Secondary | ICD-10-CM | POA: Diagnosis not present

## 2020-03-12 DIAGNOSIS — N939 Abnormal uterine and vaginal bleeding, unspecified: Secondary | ICD-10-CM | POA: Diagnosis not present

## 2020-03-12 DIAGNOSIS — R079 Chest pain, unspecified: Secondary | ICD-10-CM | POA: Diagnosis not present

## 2020-03-12 MED ORDER — ONDANSETRON 8 MG PO TBDP
8.0000 mg | ORAL_TABLET | Freq: Once | ORAL | Status: AC
  Administered 2020-03-12: 8 mg via ORAL

## 2020-03-12 NOTE — Discharge Instructions (Addendum)
Go to the emergency department for evaluation of your acute abdominal pain. 

## 2020-03-12 NOTE — ED Triage Notes (Signed)
Patient c/o upper abdominal pain and vomiting that started around 5am this morning. Patient also reports diarrhea. Patient denies fevers.

## 2020-03-12 NOTE — ED Provider Notes (Signed)
[facility]    CSN: [id number] Arrival date & time: 03/12/20  1202      History   Chief Complaint Chief Complaint  Patient presents with  . Abdominal Pain  . Emesis    HPI Kimberly Santiago is a 44 y.o. female.   Patient presents with acute abdominal pain since this morning.  She reports 1 episode of emesis at 6 AM but none since.  She reports ongoing nausea and 4 episodes of diarrhea today.  She also reports chills but no fever.  Patient has a history of gastric bypass in 2016, Barrett's esophagitis, GERD, hiatal hernia, asthma, anemia, sleep apnea, anxiety, depression, migraines, PCOS.  The history is provided by the patient.    Past Medical History:  Diagnosis Date  . Anemia   . Asthma   . Barrett esophagus   . Complication of anesthesia    Pt stopped breathing during EGD but did well with recent Gastric Bypass Surgery  . Depression   . GERD (gastroesophageal reflux disease)    H/O  . Headache    H/O MIGRAINES  . History of hiatal hernia    SMALL  . PCOS (polycystic ovarian syndrome)   . PONV (postoperative nausea and vomiting)   . Sleep apnea    PT IS NOT USING CPAP    Patient Active Problem List   Diagnosis Date Noted  . OSA (obstructive sleep apnea) 03/09/2020  . GERD (gastroesophageal reflux disease)   . Status post repeat low transverse cesarean section 07/27/2019  . Anxiety 08/21/2017  . Depression 08/21/2017  . Status post bariatric surgery 09/27/2014    Past Surgical History:  Procedure Laterality Date  . CESAREAN SECTION N/A 08/18/2017   Procedure: Primary CESAREAN SECTION;  Surgeon: Noland Fordyce, MD;  Location: [facility];  Service: Obstetrics;  Laterality: N/A;  EDD: 09/13/17 Allergy: Amoxicillin, Morphine, Hydrocodone  . CESAREAN SECTION WITH BILATERAL TUBAL LIGATION Bilateral 07/27/2019   Procedure: Repeat CESAREAN SECTION WITH BILATERAL TUBAL LIGATION;  Surgeon: Noland Fordyce, MD;  Location: [facility];  Service:  Obstetrics;  Laterality: Bilateral;  EDD: 07/31/19  . CHOLECYSTECTOMY    . DIAGNOSTIC LAPAROSCOPY    . EAR CYST EXCISION N/A 11/24/2014   Procedure: CYST REMOVAL;  Surgeon: Bud Face, MD;  Location: [facility] ORS;  Service: ENT;  Laterality: N/A;  upper lip  . ESOPHAGOGASTRODUODENOSCOPY    . ETHMOIDECTOMY Right 05/07/2018   Procedure: ETHMOIDECTOMY;  Surgeon: Bud Face, MD;  Location: [facility] ORS;  Service: ENT;  Laterality: Right;  . FRONTAL SINUS EXPLORATION Right 05/07/2018   Procedure: FRONTAL SINUS EXPLORATION;  Surgeon: Bud Face, MD;  Location: [facility] OR[facility];  Service: ENT;  Laterality: Right;  . GASTRIC BYPASS  March 2016  . IMAGE GUIDED SINUS SURGERY N/A 05/07/2018   Procedure: IMAGE GUIDED SINUS SURGERY;  Surgeon: Bud Face, MD;  Location: [facility] OR[facility];  Service: ENT;  Laterality: N/A;  . KNEE ARTHROSCOPY Right   . MAXILLARY ANTROSTOMY Bilateral 05/07/2018   Procedure: MAXILLARY ANTROSTOMY;  Surgeon: Bud Face, MD;  Location: [facility] OR[facility];  Service: ENT;  Laterality: Bilateral;  . NASAL SEPTOPLASTY W/ TURBINOPLASTY Bilateral 05/07/2018   Procedure: NASAL SEPTOPLASTY WITH TURBINATE REDUCTION;  Surgeon: Bud Face, MD;  Location: [facility] OR[facility];  Service: ENT;  Laterality: Bilateral;  . SPHENOIDECTOMY Right 05/07/2018   Procedure: SPHENOIDECTOMY;  Surgeon: Bud Face, MD;  Location: [facility] OR[facility];  Service: ENT;  Laterality: Right;  . TEMPOROMANDIBULAR JOINT SURGERY    . WISDOM TOOTH EXTRACTION  OB History    Gravida  2   Para  2   Term  1   Preterm  1   AB      Living  2     SAB      TAB      Ectopic      Multiple  0   Live Births  2            Home Medications    Prior to Admission medications   Medication Sig Start Date End Date Taking? Authorizing Provider  busPIRone (BUSPAR) 7.5 MG tablet Take 7.5 mg by mouth at bedtime.   Yes [provider]  citalopram (CELEXA) 40 MG tablet TAKE 1 TABLET BY MOUTH EVERYDAY AT BEDTIME  03/05/20  Yes Particia Nearing, PA-C  famotidine (PEPCID) 20 MG tablet Take 20 mg by mouth at bedtime. 02/14/20  Yes [provider]  pantoprazole (PROTONIX) 40 MG tablet Take 40 mg by mouth daily.   Yes [provider]  acetaminophen (TYLENOL) 325 MG tablet Take 650 mg by mouth every 6 (six) hours as needed for mild pain or headache.    [provider]  fluticasone (FLONASE) 50 MCG/ACT nasal spray Place 2 sprays into both nostrils at bedtime.    [provider]  nystatin-triamcinolone ointment (MYCOLOG) Apply 1 application topically every other day. 02/02/19   [provider]    Family History Family History  Problem Relation Age of Onset  . Breast cancer Paternal Aunt        pat great aunt  . Heart disease Maternal Grandmother   . Heart disease Maternal Grandfather   . Heart disease Paternal Grandmother   . Heart disease Paternal Grandfather   . Anxiety disorder Mother   . Depression Mother   . Miscarriages / [place] Sister     Social History Social History   Tobacco Use  . Smoking status: Never Smoker  . Smokeless tobacco: Never Used  Vaping Use  . Vaping Use: Never used  Substance Use Topics  . Alcohol use: Not Currently  . Drug use: No     Allergies   Hydromorphone, Hydrocodone, Morphine and related, Nsaids, and Amoxicillin   Review of Systems Review of Systems  Constitutional: Positive for chills. Negative for fever.  HENT: Negative for ear pain and sore throat.   Eyes: Negative for pain and visual disturbance.  Respiratory: Negative for cough and shortness of breath.   Cardiovascular: Negative for chest pain and palpitations.  Gastrointestinal: Positive for abdominal pain, diarrhea, nausea and vomiting.  Genitourinary: Negative for dysuria and hematuria.  Musculoskeletal: Negative for arthralgias and back pain.  Skin: Negative for color change and rash.  Neurological: Negative for seizures and syncope.  All  other systems reviewed and are negative.    Physical Exam Triage Vital Signs ED Triage Vitals  Enc Vitals Group     BP 03/12/20 1316 104/64     Pulse Rate 03/12/20 1316 97     Resp 03/12/20 1316 16     Temp 03/12/20 1316 97.6 F (36.4 C)     Temp Source 03/12/20 1316 Oral     SpO2 03/12/20 1316 99 %     Weight 03/12/20 1315 285 lb (129.3 kg)     Height 03/12/20 1315 5\' 8"  (1.727 m)     Head Circumference --      Peak Flow --      Pain Score --      Pain Loc --  Pain Edu? --      Excl. in GC? --    No data found.  Updated Vital Signs BP 104/64 (BP Location: Right Arm)   Pulse 97   Temp 97.6 F (36.4 C) (Oral)   Resp 16   Ht 5\' 8"  (1.727 m)   Wt 285 lb (129.3 kg)   LMP 03/04/2020   SpO2 99%   Breastfeeding No   BMI 43.33 kg/m   Visual Acuity Right Eye Distance:   Left Eye Distance:   Bilateral Distance:    Right Eye Near:   Left Eye Near:    Bilateral Near:     Physical Exam Vitals and nursing note reviewed.  Constitutional:      General: She is in acute distress.     Appearance: She is well-developed.  HENT:     Head: Normocephalic and atraumatic.     Mouth/Throat:     Mouth: Mucous membranes are moist.  Eyes:     Conjunctiva/sclera: Conjunctivae normal.  Cardiovascular:     Rate and Rhythm: Normal rate and regular rhythm.     Heart sounds: No murmur heard.   Pulmonary:     Effort: Pulmonary effort is normal. No respiratory distress.     Breath sounds: Normal breath sounds.  Abdominal:     General: Bowel sounds are normal.     Palpations: Abdomen is soft.     Tenderness: There is abdominal tenderness in the right upper quadrant and left upper quadrant. There is no guarding or rebound.  Musculoskeletal:     Cervical back: Neck supple.  Skin:    General: Skin is warm and dry.  Neurological:     General: No focal deficit present.     Mental Status: She is alert and oriented to person, place, and time.     Gait: Gait normal.  Psychiatric:         Mood and Affect: Mood normal.        Behavior: Behavior normal.      UC Treatments / Results  Labs (all labs ordered are listed, but only abnormal results are displayed) Labs Reviewed  SARS CORONAVIRUS 2 (TAT 6-24 HRS)    EKG   Radiology No results found.  Procedures Procedures (including critical care time)  Medications Ordered in UC Medications  ondansetron (ZOFRAN-ODT) disintegrating tablet 8 mg (8 mg Oral Given 03/12/20 1322)    Initial Impression / Assessment and Plan / UC Course  I have reviewed the triage vital signs and the nursing notes.  Pertinent labs & imaging results that were available during my care of the patient were reviewed by me and considered in my medical decision making (see chart for details).    Acute abdominal pain.  Patient's abdomen is acutely tender to palpation.  Sending her to the ED for evaluation.  Patient declines EMS and states she is stable to drive herself.      Final Clinical Impressions(s) / UC Diagnoses   Final diagnoses:  Pain of upper abdomen     Discharge Instructions     Go to the emergency department for evaluation of your acute abdominal pain.    ED Prescriptions    None     PDMP not reviewed this encounter.   05/12/20, NP 03/12/20 1346

## 2020-03-13 DIAGNOSIS — R110 Nausea: Secondary | ICD-10-CM | POA: Insufficient documentation

## 2020-03-13 DIAGNOSIS — R1013 Epigastric pain: Secondary | ICD-10-CM | POA: Insufficient documentation

## 2020-03-13 LAB — SARS CORONAVIRUS 2 (TAT 6-24 HRS): SARS Coronavirus 2: NEGATIVE

## 2020-03-14 LAB — CBC WITH DIFFERENTIAL/PLATELET
Basophils Absolute: 0.1 10*3/uL (ref 0.0–0.2)
Basos: 1 %
EOS (ABSOLUTE): 0.3 10*3/uL (ref 0.0–0.4)
Eos: 4 %
Hematocrit: 41.8 % (ref 34.0–46.6)
Hemoglobin: 13.9 g/dL (ref 11.1–15.9)
Immature Grans (Abs): 0 10*3/uL (ref 0.0–0.1)
Immature Granulocytes: 0 %
Lymphocytes Absolute: 1.7 10*3/uL (ref 0.7–3.1)
Lymphs: 21 %
MCH: 28.4 pg (ref 26.6–33.0)
MCHC: 33.3 g/dL (ref 31.5–35.7)
MCV: 86 fL (ref 79–97)
Monocytes Absolute: 0.8 10*3/uL (ref 0.1–0.9)
Monocytes: 10 %
Neutrophils Absolute: 5.2 10*3/uL (ref 1.4–7.0)
Neutrophils: 64 %
Platelets: 307 10*3/uL (ref 150–450)
RBC: 4.89 x10E6/uL (ref 3.77–5.28)
RDW: 12.6 % (ref 11.7–15.4)
WBC: 8 10*3/uL (ref 3.4–10.8)

## 2020-03-14 LAB — VITAMIN K1, SERUM: VITAMIN K1: 0.43 ng/mL (ref 0.10–2.20)

## 2020-03-14 LAB — VITAMIN D 25 HYDROXY (VIT D DEFICIENCY, FRACTURES): Vit D, 25-Hydroxy: 28.4 ng/mL — ABNORMAL LOW (ref 30.0–100.0)

## 2020-03-14 LAB — EHRLICHIA ANTIBODY PANEL
E. Chaffeensis (HME) IgM Titer: NEGATIVE
E.Chaffeensis (HME) IgG: NEGATIVE
HGE IgG Titer: NEGATIVE
HGE IgM Titer: NEGATIVE

## 2020-03-14 LAB — IRON AND TIBC
Iron Saturation: 12 % — ABNORMAL LOW (ref 15–55)
Iron: 43 ug/dL (ref 27–159)
Total Iron Binding Capacity: 369 ug/dL (ref 250–450)
UIBC: 326 ug/dL (ref 131–425)

## 2020-03-14 LAB — B12 AND FOLATE PANEL
Folate: 4.5 ng/mL (ref >3.0)
Vitamin B-12: 279 pg/mL (ref 232–1245)

## 2020-03-14 LAB — COMPREHENSIVE METABOLIC PANEL
ALT: 18 IU/L (ref 0–32)
AST: 13 IU/L (ref 0–40)
Albumin/Globulin Ratio: 1.6 (ref 1.2–2.2)
Albumin: 3.8 g/dL (ref 3.8–4.8)
Alkaline Phosphatase: 84 IU/L (ref 48–121)
BUN/Creatinine Ratio: 12 (ref 9–23)
BUN: 9 mg/dL (ref 6–24)
Bilirubin Total: 0.4 mg/dL (ref 0.0–1.2)
CO2: 26 mmol/L (ref 20–29)
Calcium: 8.6 mg/dL — ABNORMAL LOW (ref 8.7–10.2)
Chloride: 103 mmol/L (ref 96–106)
Creatinine, Ser: 0.78 mg/dL (ref 0.57–1.00)
GFR calc Af Amer: 108 mL/min/{1.73_m2} (ref >59)
GFR calc non Af Amer: 93 mL/min/{1.73_m2} (ref >59)
Globulin, Total: 2.4 g/dL (ref 1.5–4.5)
Glucose: 80 mg/dL (ref 65–99)
Potassium: 4.2 mmol/L (ref 3.5–5.2)
Sodium: 140 mmol/L (ref 134–144)
Total Protein: 6.2 g/dL (ref 6.0–8.5)

## 2020-03-14 LAB — LIPID PANEL W/O CHOL/HDL RATIO
Cholesterol, Total: 151 mg/dL (ref 100–199)
HDL: 64 mg/dL (ref >39)
LDL Chol Calc (NIH): 77 mg/dL (ref 0–99)
Triglycerides: 48 mg/dL (ref 0–149)
VLDL Cholesterol Cal: 10 mg/dL (ref 5–40)

## 2020-03-14 LAB — TSH: TSH: 2.02 u[IU]/mL (ref 0.450–4.500)

## 2020-03-14 LAB — BABESIA MICROTI ANTIBODY PANEL
Babesia microti IgG: <1:10 {titer}
Babesia microti IgM: <1:10 {titer}

## 2020-03-14 LAB — ROCKY MTN SPOTTED FVR ABS PNL(IGG+IGM)
RMSF IgG: NEGATIVE
RMSF IgM: 0.25 index (ref 0.00–0.89)

## 2020-03-14 LAB — LYME AB/WESTERN BLOT REFLEX
LYME DISEASE AB, QUANT, IGM: <0.8 index (ref 0.00–0.79)
Lyme IgG/IgM Ab: <0.91 {ISR} (ref 0.00–0.90)

## 2020-03-14 LAB — FERRITIN: Ferritin: 27 ng/mL (ref 15–150)

## 2020-03-17 DIAGNOSIS — N939 Abnormal uterine and vaginal bleeding, unspecified: Secondary | ICD-10-CM | POA: Insufficient documentation

## 2020-03-17 DIAGNOSIS — R7881 Bacteremia: Secondary | ICD-10-CM | POA: Insufficient documentation

## 2020-03-17 DIAGNOSIS — E282 Polycystic ovarian syndrome: Secondary | ICD-10-CM | POA: Insufficient documentation

## 2020-03-29 ENCOUNTER — Other Ambulatory Visit: Payer: Self-pay | Admitting: Family Medicine

## 2020-03-29 NOTE — Telephone Encounter (Signed)
Requested Prescriptions  Pending Prescriptions Disp Refills  . citalopram (CELEXA) 40 MG tablet [Pharmacy Med Name: CITALOPRAM HBR 40 MG TABLET] 90 tablet 1    Sig: TAKE 1 TABLET BY MOUTH EVERYDAY AT BEDTIME     Psychiatry:  Antidepressants - SSRI Passed - 03/29/2020  9:32 AM      Passed - Completed PHQ-2 or PHQ-9 in the last 360 days.      Passed - Valid encounter within last 6 months    Recent Outpatient Visits          2 weeks ago Chronic fatigue   [facility] Togus Warner, [place], [place]   6 months ago Left hip pain   [facility] Roosvelt Maser [place], [place]   7 months ago Anxiety   [facility] [place], Salley Hews, [place]      Future Appointments            In 5 months Laural Benes, Oralia Rud, DO [employer], [facility]

## 2020-04-21 ENCOUNTER — Other Ambulatory Visit: Payer: Self-pay

## 2020-04-21 ENCOUNTER — Ambulatory Visit
Admission: EM | Admit: 2020-04-21 | Discharge: 2020-04-21 | Disposition: A | Discharge status: Home / Self Care | Payer: Managed Care, Other (non HMO) | Attending: Family Medicine | Admitting: Family Medicine

## 2020-04-21 ENCOUNTER — Encounter: Payer: Self-pay | Admitting: Emergency Medicine

## 2020-04-21 DIAGNOSIS — J069 Acute upper respiratory infection, unspecified: Secondary | ICD-10-CM | POA: Diagnosis not present

## 2020-04-21 DIAGNOSIS — Z20822 Contact with and (suspected) exposure to covid-19: Secondary | ICD-10-CM | POA: Insufficient documentation

## 2020-04-21 LAB — SARS CORONAVIRUS 2 (TAT 6-24 HRS): SARS Coronavirus 2: NEGATIVE

## 2020-04-21 MED ORDER — DOXYCYCLINE HYCLATE 100 MG PO CAPS: 100.0000 mg | ORAL_CAPSULE | Freq: Two times a day (BID) | ORAL | 0 refills | Status: DC

## 2020-04-21 NOTE — ED Triage Notes (Signed)
Patient c/o bilateral ear pressure and pain that started yesterday.  Patient c/o sinus congestion and cough that started 3-4 days ago.  Patient denies fevers.

## 2020-04-21 NOTE — Discharge Instructions (Signed)
Give it a little more time.  If continues to worsen or not improve, you can start the antibiotic.  Continue Claritin.  Take care  Dr. Adriana Simas

## 2020-04-21 NOTE — ED Provider Notes (Signed)
[facility]    CSN: [id number] Arrival date & time: 04/21/20  0906  History   Chief Complaint Chief Complaint  Patient presents with  . Otalgia  . Sinus Problem  . Cough   HPI  44 year old female presents with the above complaints.  Patient reports that her symptoms started on Tuesday.  She reports cough, congestion, runny nose.  She has developed bilateral ear pain/pressure as of yesterday.  Denies fever.  Has a history of sinus issues and has had multiple sinus surgeries.  She has taken Claritin without relief.  No other associated symptoms.  No other complaints concerns at this time.  Past Medical History:  Diagnosis Date  . Anemia   . Asthma   . Barrett esophagus   . Complication of anesthesia    Pt stopped breathing during EGD but did well with recent Gastric Bypass Surgery  . Depression   . GERD (gastroesophageal reflux disease)    H/O  . Headache    H/O MIGRAINES  . History of hiatal hernia    SMALL  . PCOS (polycystic ovarian syndrome)   . PONV (postoperative nausea and vomiting)   . Sleep apnea    PT IS NOT USING CPAP    Patient Active Problem List   Diagnosis Date Noted  . OSA (obstructive sleep apnea) 03/09/2020  . GERD (gastroesophageal reflux disease)   . Status post repeat low transverse cesarean section 07/27/2019  . Anxiety 08/21/2017  . Depression 08/21/2017  . Status post bariatric surgery 09/27/2014    Past Surgical History:  Procedure Laterality Date  . CESAREAN SECTION N/A 08/18/2017   Procedure: Primary CESAREAN SECTION;  Surgeon: Noland Fordyce, MD;  Location: [facility]THING SU[facility];  Service: Obstetrics;  Laterality: N/A;  EDD: 09/13/17 Allergy: Amoxicillin, Morphine, Hydrocodone  . CESAREAN SECTION WITH BILATERAL TUBAL LIGATION Bilateral 07/27/2019   Procedure: Repeat CESAREAN SECTION WITH BILATERAL TUBAL LIGATION;  Surgeon: Noland Fordyce, MD;  Location: [facility];  Service: Obstetrics;  Laterality: Bilateral;  EDD: 07/31/19  .  CHOLECYSTECTOMY    . DIAGNOSTIC LAPAROSCOPY    . EAR CYST EXCISION N/A 11/24/2014   Procedure: CYST REMOVAL;  Surgeon: Bud Face, MD;  Location: [facility] ORS;  Service: ENT;  Laterality: N/A;  upper lip  . ESOPHAGOGASTRODUODENOSCOPY    . ETHMOIDECTOMY Right 05/07/2018   Procedure: ETHMOIDECTOMY;  Surgeon: Bud Face, MD;  Location: [facility] ORS;  Service: ENT;  Laterality: Right;  . FRONTAL SINUS EXPLORATION Right 05/07/2018   Procedure: FRONTAL SINUS EXPLORATION;  Surgeon: Bud Face, MD;  Location: [facility] ORS;  Service: ENT;  Laterality: Right;  . GASTRIC BYPASS  March 2016  . IMAGE GUIDED SINUS SURGERY N/A 05/07/2018   Procedure: IMAGE GUIDED SINUS SURGERY;  Surgeon: Bud Face, MD;  Location: [facility] ORS;  Service: ENT;  Laterality: N/A;  . KNEE ARTHROSCOPY Right   . MAXILLARY ANTROSTOMY Bilateral 05/07/2018   Procedure: MAXILLARY ANTROSTOMY;  Surgeon: Bud Face, MD;  Location: [facility] ORS;  Service: ENT;  Laterality: Bilateral;  . NASAL SEPTOPLASTY W/ TURBINOPLASTY Bilateral 05/07/2018   Procedure: NASAL SEPTOPLASTY WITH TURBINATE REDUCTION;  Surgeon: Bud Face, MD;  Location: [facility] ORS;  Service: ENT;  Laterality: Bilateral;  . SPHENOIDECTOMY Right 05/07/2018   Procedure: SPHENOIDECTOMY;  Surgeon: Bud Face, MD;  Location: [facility] ORS;  Service: ENT;  Laterality: Right;  . TEMPOROMANDIBULAR JOINT SURGERY    . WISDOM TOOTH EXTRACTION      OB History    Gravida  2   Para  2  Term  1   Preterm  1   AB      Living  2     SAB      TAB      Ectopic      Multiple  0   Live Births  2            Home Medications    Prior to Admission medications   Medication Sig Start Date End Date Taking? Authorizing Provider  acetaminophen (TYLENOL) 325 MG tablet Take 650 mg by mouth every 6 (six) hours as needed for mild pain or headache.   Yes [provider]  busPIRone (BUSPAR) 7.5 MG tablet Take 7.5 mg by mouth at bedtime.   Yes [provider]  citalopram (CELEXA) 40 MG tablet TAKE 1 TABLET BY MOUTH EVERYDAY AT BEDTIME 03/29/20  Yes Johnson, Megan P, DO  famotidine (PEPCID) 20 MG tablet Take 20 mg by mouth at bedtime. 02/14/20  Yes [provider]  norethindrone-ethinyl estradiol (LOESTRIN) 1-20 MG-MCG tablet Take by mouth. 03/17/20 03/17/21 Yes [provider]  pantoprazole (PROTONIX) 40 MG tablet Take 40 mg by mouth daily.   Yes [provider]  doxycycline (VIBRAMYCIN) 100 MG capsule Take 1 capsule (100 mg total) by mouth 2 (two) times daily. 04/21/20   Tommie Sams, DO  fluticasone (FLONASE) 50 MCG/ACT nasal spray Place 2 sprays into both nostrils at bedtime.    [provider]  nystatin-triamcinolone ointment (MYCOLOG) Apply 1 application topically every other day. 02/02/19   [provider]    Family History Family History  Problem Relation Age of Onset  . Breast cancer Paternal Aunt        pat great aunt  . Heart disease Maternal Grandmother   . Heart disease Maternal Grandfather   . Heart disease Paternal Grandmother   . Heart disease Paternal Grandfather   . Anxiety disorder Mother   . Depression Mother   . Miscarriages / [place] Sister     Social History Social History   Tobacco Use  . Smoking status: Never Smoker  . Smokeless tobacco: Never Used  Vaping Use  . Vaping Use: Never used  Substance Use Topics  . Alcohol use: Not Currently  . Drug use: No     Allergies   Hydromorphone, Hydrocodone, Morphine and related, Nsaids, and Amoxicillin   Review of Systems Review of Systems  Constitutional: Negative for fever.  HENT: Positive for congestion, ear pain and rhinorrhea.    Physical Exam Triage Vital Signs ED Triage Vitals  Enc Vitals Group     BP 04/21/20 0926 115/82     Pulse Rate 04/21/20 0926 80     Resp 04/21/20 0926 16     Temp 04/21/20 0926 98.1 F (36.7 C)     Temp Source 04/21/20 0926 Oral     SpO2 04/21/20 0926 98 %      Weight 04/21/20 0920 280 lb (127 kg)     Height 04/21/20 0920 5\' 8"  (1.727 m)     Head Circumference --      Peak Flow --      Pain Score 04/21/20 0919 3     Pain Loc --      Pain Edu? --      Excl. in [place]? --    Updated Vital Signs BP 115/82 (BP Location: Right Arm)   Pulse 80   Temp 98.1 F (36.7 C) (Oral)   Resp 16   Ht 5\' 8"  (1.727  m)   Wt 127 kg   LMP 04/10/2020 (Exact Date)   SpO2 98%   Breastfeeding No   BMI 42.57 kg/m   Visual Acuity Right Eye Distance:   Left Eye Distance:   Bilateral Distance:    Right Eye Near:   Left Eye Near:    Bilateral Near:     Physical Exam Vitals and nursing note reviewed.  Constitutional:      General: She is not in acute distress.    Appearance: Normal appearance. She is not ill-appearing.  HENT:     Head: Normocephalic and atraumatic.     Right Ear: Tympanic membrane normal.     Left Ear: Tympanic membrane normal.  Eyes:     General:        Right eye: No discharge.        Left eye: No discharge.     Conjunctiva/sclera: Conjunctivae normal.  Cardiovascular:     Rate and Rhythm: Normal rate and regular rhythm.     Heart sounds: No murmur heard.   Pulmonary:     Effort: Pulmonary effort is normal.     Breath sounds: Normal breath sounds. No wheezing, rhonchi or rales.  Neurological:     Mental Status: She is alert.  Psychiatric:        Mood and Affect: Mood normal.        Behavior: Behavior normal.    UC Treatments / Results  Labs (all labs ordered are listed, but only abnormal results are displayed) Labs Reviewed  SARS CORONAVIRUS 2 (TAT 6-24 HRS)    EKG   Radiology No results found.  Procedures Procedures (including critical care time)  Medications Ordered in UC Medications - No data to display  Initial Impression / Assessment and Plan / UC Course  I have reviewed the triage vital signs and the nursing notes.  Pertinent labs & imaging results that were available during my care of the patient were  reviewed by me and considered in my medical decision making (see chart for details).    44 year old female presents with respiratory infection.  Likely viral at this time.  Patient does have a history of recurrent sinusitis.  Advised watchful waiting and supportive care with over-the-counter Claritin.  If she fails to improve or worsens, she may start the Doxycycline (to cover for sinusitis).   Final Clinical Impressions(s) / UC Diagnoses   Final diagnoses:  Upper respiratory tract infection, unspecified type     Discharge Instructions     Give it a little more time.  If continues to worsen or not improve, you can start the antibiotic.  Continue Claritin.  Take care  Dr. Adriana Simas    ED Prescriptions    Medication Sig Dispense Auth. Provider   doxycycline (VIBRAMYCIN) 100 MG capsule Take 1 capsule (100 mg total) by mouth 2 (two) times daily. 14 capsule Everlene Other G, DO     PDMP not reviewed this encounter.   Everlene Other [place], [place] 04/21/20 [phone]

## 2020-04-25 ENCOUNTER — Encounter: Payer: Self-pay | Admitting: Family Medicine

## 2020-04-25 DIAGNOSIS — K219 Gastro-esophageal reflux disease without esophagitis: Secondary | ICD-10-CM

## 2020-04-25 DIAGNOSIS — Z9884 Bariatric surgery status: Secondary | ICD-10-CM

## 2020-05-15 ENCOUNTER — Other Ambulatory Visit: Payer: Self-pay

## 2020-05-15 ENCOUNTER — Ambulatory Visit
Admission: EM | Admit: 2020-05-15 | Discharge: 2020-05-15 | Disposition: A | Discharge status: Home / Self Care | Payer: Managed Care, Other (non HMO) | Attending: Emergency Medicine | Admitting: Emergency Medicine

## 2020-05-15 ENCOUNTER — Encounter: Payer: Self-pay | Admitting: Emergency Medicine

## 2020-05-15 DIAGNOSIS — J069 Acute upper respiratory infection, unspecified: Secondary | ICD-10-CM

## 2020-05-15 MED ORDER — BENZONATATE 100 MG PO CAPS: 200.0000 mg | ORAL_CAPSULE | Freq: Three times a day (TID) | ORAL | 0 refills | Status: DC

## 2020-05-15 MED ORDER — PROMETHAZINE-DM 6.25-15 MG/5ML PO SYRP: 5.0000 mL | ORAL_SOLUTION | Freq: Four times a day (QID) | ORAL | 0 refills | Status: DC | PRN

## 2020-05-15 NOTE — ED Triage Notes (Signed)
Patient c/o cough that started over 1 week ago. She states she has had pneumonia before and wants to make sure she does not have this again. Denies fever.

## 2020-05-15 NOTE — ED Provider Notes (Signed)
[facility]    CSN: [id number] Arrival date & time: 05/15/20  0800      History   Chief Complaint Chief Complaint  Patient presents with  . Cough    HPI Kimberly Santiago is a 44 y.o. female.   44 year old female here for evaluation of chest congestion and cough.  Patient reports that she has had symptoms for the past week.  Her symptoms include runny nose, sneezing, sinus pressure, and headache.  Patient denies fever, shortness of breath, wheezing, nausea, vomiting, diarrhea, body aches, changes to taste or smell, or sick contacts.  Patient has had her full Covid series but has not received her flu vaccine this year.     Past Medical History:  Diagnosis Date  . Anemia   . Asthma   . Barrett esophagus   . Complication of anesthesia    Pt stopped breathing during EGD but did well with recent Gastric Bypass Surgery  . Depression   . GERD (gastroesophageal reflux disease)    H/O  . Headache    H/O MIGRAINES  . History of hiatal hernia    SMALL  . PCOS (polycystic ovarian syndrome)   . PONV (postoperative nausea and vomiting)   . Sleep apnea    PT IS NOT USING CPAP    Patient Active Problem List   Diagnosis Date Noted  . OSA (obstructive sleep apnea) 03/09/2020  . GERD (gastroesophageal reflux disease)   . Status post repeat low transverse cesarean section 07/27/2019  . Anxiety 08/21/2017  . Depression 08/21/2017  . Status post bariatric surgery 09/27/2014    Past Surgical History:  Procedure Laterality Date  . CESAREAN SECTION N/A 08/18/2017   Procedure: Primary CESAREAN SECTION;  Surgeon: Aloha Gell, MD;  Location: [place];  Service: Obstetrics;  Laterality: N/A;  EDD: 09/13/17 Allergy: Amoxicillin, Morphine, Hydrocodone  . CESAREAN SECTION WITH BILATERAL TUBAL LIGATION Bilateral 07/27/2019   Procedure: Repeat CESAREAN SECTION WITH BILATERAL TUBAL LIGATION;  Surgeon: Aloha Gell, MD;  Location: [place] [facility];  Service: Obstetrics;   Laterality: Bilateral;  EDD: 07/31/19  . CHOLECYSTECTOMY    . DIAGNOSTIC LAPAROSCOPY    . EAR CYST EXCISION N/A 11/24/2014   Procedure: CYST REMOVAL;  Surgeon: Carloyn Manner, MD;  Location: [facility] ORS;  Service: ENT;  Laterality: N/A;  upper lip  . ESOPHAGOGASTRODUODENOSCOPY    . ETHMOIDECTOMY Right 05/07/2018   Procedure: ETHMOIDECTOMY;  Surgeon: Carloyn Manner, MD;  Location: [facility] OR[facility];  Service: ENT;  Laterality: Right;  . FRONTAL SINUS EXPLORATION Right 05/07/2018   Procedure: FRONTAL SINUS EXPLORATION;  Surgeon: Carloyn Manner, MD;  Location: [facility] OR[facility];  Service: ENT;  Laterality: Right;  . GASTRIC BYPASS  March 2016  . IMAGE GUIDED SINUS SURGERY N/A 05/07/2018   Procedure: IMAGE GUIDED SINUS SURGERY;  Surgeon: Carloyn Manner, MD;  Location: [facility] OR[facility];  Service: ENT;  Laterality: N/A;  . KNEE ARTHROSCOPY Right   . MAXILLARY ANTROSTOMY Bilateral 05/07/2018   Procedure: MAXILLARY ANTROSTOMY;  Surgeon: Carloyn Manner, MD;  Location: [facility] OR[facility];  Service: ENT;  Laterality: Bilateral;  . NASAL SEPTOPLASTY W/ TURBINOPLASTY Bilateral 05/07/2018   Procedure: NASAL SEPTOPLASTY WITH TURBINATE REDUCTION;  Surgeon: Carloyn Manner, MD;  Location: [facility] OR[facility];  Service: ENT;  Laterality: Bilateral;  . SPHENOIDECTOMY Right 05/07/2018   Procedure: SPHENOIDECTOMY;  Surgeon: Carloyn Manner, MD;  Location: [facility] OR[facility];  Service: ENT;  Laterality: Right;  . TEMPOROMANDIBULAR JOINT SURGERY    . WISDOM TOOTH EXTRACTION      OB History  Gravida  2   Para  2   Term  1   Preterm  1   AB      Living  2     SAB      TAB      Ectopic      Multiple  0   Live Births  2            Home Medications    Prior to Admission medications   Medication Sig Start Date End Date Taking? Authorizing Provider  acetaminophen (TYLENOL) 325 MG tablet Take 650 mg by mouth every 6 (six) hours as needed for mild pain or headache.   Yes [provider]  busPIRone (BUSPAR) 7.5 MG tablet Take  7.5 mg by mouth at bedtime.   Yes [provider]  citalopram (CELEXA) 40 MG tablet TAKE 1 TABLET BY MOUTH EVERYDAY AT BEDTIME 03/29/20  Yes Johnson, Megan P, DO  famotidine (PEPCID) 20 MG tablet Take 20 mg by mouth at bedtime. 02/14/20  Yes [provider]  norethindrone-ethinyl estradiol (LOESTRIN) 1-20 MG-MCG tablet Take by mouth. 03/17/20 03/17/21 Yes [provider]  benzonatate (TESSALON) 100 MG capsule Take 2 capsules (200 mg total) by mouth every 8 (eight) hours. 05/15/20   Margarette Canada, NP  promethazine-dextromethorphan (PROMETHAZINE-DM) 6.25-15 MG/5ML syrup Take 5 mLs by mouth 4 (four) times daily as needed. 05/15/20   Margarette Canada, NP  fluticasone (FLONASE) 50 MCG/ACT nasal spray Place 2 sprays into both nostrils at bedtime.  05/15/20  [provider]    Family History Family History  Problem Relation Age of Onset  . Breast cancer Paternal Aunt        pat great aunt  . Heart disease Maternal Grandmother   . Heart disease Maternal Grandfather   . Heart disease Paternal Grandmother   . Heart disease Paternal Grandfather   . Anxiety disorder Mother   . Depression Mother   . Miscarriages / [place] Sister     Social History Social History   Tobacco Use  . Smoking status: Never Smoker  . Smokeless tobacco: Never Used  Vaping Use  . Vaping Use: Never used  Substance Use Topics  . Alcohol use: Not Currently  . Drug use: No     Allergies   Hydromorphone, Hydrocodone, Morphine and related, Nsaids, and Amoxicillin   Review of Systems Review of Systems  Constitutional: Negative for activity change, appetite change and fever.  HENT: Positive for ear pain, rhinorrhea, sinus pressure, sneezing and sore throat. Negative for congestion.   Respiratory: Positive for cough. Negative for shortness of breath and wheezing.   Cardiovascular: Negative for chest pain.  Gastrointestinal: Negative for diarrhea, nausea and vomiting.  Musculoskeletal:  Negative for arthralgias and myalgias.  Skin: Negative for rash.  Neurological: Positive for headaches. Negative for syncope.  Hematological: Negative.   Psychiatric/Behavioral: Negative.      Physical Exam Triage Vital Signs ED Triage Vitals  Enc Vitals Group     BP 05/15/20 0813 103/84     Pulse Rate 05/15/20 0813 63     Resp 05/15/20 0813 18     Temp 05/15/20 0813 98.3 F (36.8 C)     Temp Source 05/15/20 0813 Oral     SpO2 05/15/20 0813 99 %     Weight 05/15/20 0811 285 lb (129.3 kg)     Height 05/15/20 0811 _0  (1.727 m)     Head Circumference --      Peak Flow --  Pain Score 05/15/20 0811 5     Pain Loc --      Pain Edu? --      Excl. in [place]? --    No data found.  Updated Vital Signs BP 103/84 (BP Location: Right Arm)   Pulse 63   Temp 98.3 F (36.8 C) (Oral)   Resp 18   Ht _0  (1.727 m)   Wt 285 lb (129.3 kg)   LMP 05/07/2020   SpO2 99%   BMI 43.33 kg/m   Visual Acuity Right Eye Distance:   Left Eye Distance:   Bilateral Distance:    Right Eye Near:   Left Eye Near:    Bilateral Near:     Physical Exam Vitals and nursing note reviewed.  Constitutional:      General: She is not in acute distress.    Appearance: Normal appearance. She is not toxic-appearing.  HENT:     Head: Normocephalic and atraumatic.     Right Ear: Tympanic membrane and ear canal normal.     Left Ear: Tympanic membrane and ear canal normal.     Ears:     Comments: Cerumen present in both exterior auditory canals.  TMs clearly visualized beyond the wax.  TMs are pearly gray with a normal cone of light reflex.    Nose: Congestion and rhinorrhea present.     Comments: Nasal mucosa is pink and mildly edematous.  There is thick, glue-like nasal discharge in both naris.    Mouth/Throat:     Mouth: Mucous membranes are moist.     Pharynx: Oropharynx is clear. No oropharyngeal exudate or posterior oropharyngeal erythema.     Comments: Patient is clear postnasal drip and  posterior oropharynx.  No posterior oropharyngeal erythema noted. Eyes:     General: No scleral icterus.    Extraocular Movements: Extraocular movements intact.     Conjunctiva/sclera: Conjunctivae normal.     Pupils: Pupils are equal, round, and reactive to light.  Cardiovascular:     Rate and Rhythm: Normal rate and regular rhythm.     Pulses: Normal pulses.     Heart sounds: Normal heart sounds. No murmur heard.  No gallop.   Pulmonary:     Effort: Pulmonary effort is normal. No respiratory distress.     Breath sounds: Normal breath sounds. No wheezing, rhonchi or rales.  Musculoskeletal:        General: No swelling or tenderness. Normal range of motion.     Cervical back: Normal range of motion and neck supple.  Lymphadenopathy:     Cervical: No cervical adenopathy.  Skin:    General: Skin is warm and dry.     Capillary Refill: Capillary refill takes less than 2 seconds.     Findings: No erythema or rash.  Neurological:     General: No focal deficit present.     Mental Status: She is alert and oriented to person, place, and time.  Psychiatric:        Mood and Affect: Mood normal.        Behavior: Behavior normal.        Thought Content: Thought content normal.        Judgment: Judgment normal.      UC Treatments / Results  Labs (all labs ordered are listed, but only abnormal results are displayed) Labs Reviewed - No data to display  EKG   Radiology No results found.  Procedures Procedures (including critical care time)  Medications Ordered in  UC Medications - No data to display  Initial Impression / Assessment and Plan / UC Course  I have reviewed the triage vital signs and the nursing notes.  Pertinent labs & imaging results that were available during my care of the patient were reviewed by me and considered in my medical decision making (see chart for details).   Patient is here for evaluation of chest congestion and cough for the last week.  She has had  pneumonia in the past and she is concerned that she may be heading in that direction.  Her cough is intermittently productive for a clear sputum.  She has some associated upper respiratory complaints as well.  Patient's exam is consistent with a viral upper respiratory infection.  Will treat cough with Tessalon Perles and Promethazine DM.  Since patient is not having any wheezing or shortness of breath will not give inhaler at this time.  I do not feel a chest x-ray is warranted either given clear lung sounds and normal chest excursion on exam.   Final Clinical Impressions(s) / UC Diagnoses   Final diagnoses:  Upper respiratory tract infection, unspecified type     Discharge Instructions     Increase your oral fluid intake to keep your mucus thin.  I would avoid dairy products at this time.  Use the Tessalon Perles during the day, take them with a small sip of water.  Use the Promethazine DM at nighttime for cough and congestion.  Continue your sinus irrigation twice daily with the [employer] sinus rinse kit and distilled water.  If you develop worsening symptoms, including continuous productive cough especially for a rusty-looking sputum, return for reevaluation and chest imaging.    ED Prescriptions    Medication Sig Dispense Auth. Provider   benzonatate (TESSALON) 100 MG capsule Take 2 capsules (200 mg total) by mouth every 8 (eight) hours. 21 capsule Margarette Canada, NP   promethazine-dextromethorphan (PROMETHAZINE-DM) 6.25-15 MG/5ML syrup Take 5 mLs by mouth 4 (four) times daily as needed. 118 mL Margarette Canada, NP     PDMP not reviewed this encounter.   Margarette Canada, NP 05/15/20 [phone]

## 2020-05-15 NOTE — Discharge Instructions (Addendum)
Increase your oral fluid intake to keep your mucus thin.  I would avoid dairy products at this time.  Use the Tessalon Perles during the day, take them with a small sip of water.  Use the Promethazine DM at nighttime for cough and congestion.  Continue your sinus irrigation twice daily with the [employer] sinus rinse kit and distilled water.  If you develop worsening symptoms, including continuous productive cough especially for a rusty-looking sputum, return for reevaluation and chest imaging.

## 2020-05-31 ENCOUNTER — Other Ambulatory Visit: Payer: Self-pay | Admitting: Family Medicine

## 2020-06-01 LAB — HM PAP SMEAR: HM Pap smear: NORMAL

## 2020-06-28 ENCOUNTER — Other Ambulatory Visit: Payer: Self-pay

## 2020-06-28 ENCOUNTER — Encounter: Payer: Self-pay | Admitting: Gastroenterology

## 2020-06-28 ENCOUNTER — Ambulatory Visit (INDEPENDENT_AMBULATORY_CARE_PROVIDER_SITE_OTHER): Payer: Managed Care, Other (non HMO) | Admitting: Gastroenterology

## 2020-06-28 VITALS — BP 134/76 | HR 112 | Ht 66.0 in | Wt 294.2 lb

## 2020-06-28 DIAGNOSIS — R1084 Generalized abdominal pain: Secondary | ICD-10-CM | POA: Diagnosis not present

## 2020-06-28 DIAGNOSIS — K2270 Barrett's esophagus without dysplasia: Secondary | ICD-10-CM

## 2020-06-28 DIAGNOSIS — Z8371 Family history of colonic polyps: Secondary | ICD-10-CM

## 2020-06-28 DIAGNOSIS — K22719 Barrett's esophagus with dysplasia, unspecified: Secondary | ICD-10-CM | POA: Diagnosis not present

## 2020-06-28 MED ORDER — SUTAB 1479-225-188 MG PO TABS: 376.0000 mg | ORAL_TABLET | ORAL | 0 refills | Status: DC

## 2020-06-28 NOTE — Progress Notes (Signed)
Gastroenterology Consultation  Referring Provider:     Dorcas Carrow, DO Primary Care Physician:  Particia Nearing, PA-C Primary Gastroenterologist:  Dr. Servando Snare     Reason for Consultation:     History of Barrett's esophagus        HPI:   Kimberly Santiago is a 44 y.o. y/o female referred for consultation & management of History of Barrett's esophagus by Dr. Maurice March, Salley Hews, PA-C.  This patient comes in after being seen by Dr. Marva Panda in the past.  The patient was recently at [facility].  The patient was found to have an abnormal CT scan and was suggested to follow up with GI.  A CT scan on September 14 at an outside hospital [facility]) showed:  New inflammatory changes centered on the left adnexa with secondary inflammatory changes of the adjacent colon and interval enlargement left ovary, indicative of possible torsion/detorsion. Recommend pelvic ultrasound for further evaluation.  Hyperdense lesion associated with right ovary measuring 4.1 cm, possibly hemorrhagic cyst versus endometrioma. Again, correlation with pelvic ultrasound is recommended.    Three days prior to that the patient had a CT scan of the abdomen for abdominal pain that showed:  Impression: Sequela of Roux-en-Y gastric bypass with minimally dilated loop of jejunum proximal to the jejunojejunal anastomosis. Question at least partial anastomotic stricturing. No overt small bowel obstruction.  No evidence of appendicitis or other focal acuteinflammatory abnormality.  3.3 cm right ovarian cyst, likely physiologic  A subsequent pelvic ultrasound did not show any findings correlating with the CT scan results. The patient says she has had a history of Barrett's esophagus.  The patient reports that she had 2 children by IVF and reports that since then she has gained a significant amount of weight.  She states that although she knows she is not eating the way she should she feels like she is back to the time when  she didn't have a gastric bypass surgery.  The patient reports that her abdominal pain is mostly in the upper abdomen.  Past Medical History:  Diagnosis Date  . Anemia   . Asthma   . Barrett esophagus   . Complication of anesthesia    Pt stopped breathing during EGD but did well with recent Gastric Bypass Surgery  . Depression   . GERD (gastroesophageal reflux disease)    H/O  . Headache    H/O MIGRAINES  . History of hiatal hernia    SMALL  . PCOS (polycystic ovarian syndrome)   . PONV (postoperative nausea and vomiting)   . Sleep apnea    PT IS NOT USING CPAP    Past Surgical History:  Procedure Laterality Date  . CESAREAN SECTION N/A 08/18/2017   Procedure: Primary CESAREAN SECTION;  Surgeon: Noland Fordyce, MD;  Location: [facility] BIRTHING SUITES;  Service: Obstetrics;  Laterality: N/A;  EDD: 09/13/17 Allergy: Amoxicillin, Morphine, Hydrocodone  . CESAREAN SECTION WITH BILATERAL TUBAL LIGATION Bilateral 07/27/2019   Procedure: Repeat CESAREAN SECTION WITH BILATERAL TUBAL LIGATION;  Surgeon: Noland Fordyce, MD;  Location: [facility] ORS;  Service: Obstetrics;  Laterality: Bilateral;  EDD: 07/31/19  . CHOLECYSTECTOMY    . DIAGNOSTIC LAPAROSCOPY    . EAR CYST EXCISION N/A 11/24/2014   Procedure: CYST REMOVAL;  Surgeon: Bud Face, MD;  Location: [facility] ORS;  Service: ENT;  Laterality: N/A;  upper lip  . ESOPHAGOGASTRODUODENOSCOPY    . ETHMOIDECTOMY Right 05/07/2018   Procedure: ETHMOIDECTOMY;  Surgeon: Bud Face, MD;  Location: [facility]  ORS;  Service: ENT;  Laterality: Right;  . FRONTAL SINUS EXPLORATION Right 05/07/2018   Procedure: FRONTAL SINUS EXPLORATION;  Surgeon: Bud Face, MD;  Location: [facility] ORS;  Service: ENT;  Laterality: Right;  . GASTRIC BYPASS  March 2016  . IMAGE GUIDED SINUS SURGERY N/A 05/07/2018   Procedure: IMAGE GUIDED SINUS SURGERY;  Surgeon: Bud Face, MD;  Location: [facility] ORS;  Service: ENT;  Laterality: N/A;  . KNEE ARTHROSCOPY Right   .  MAXILLARY ANTROSTOMY Bilateral 05/07/2018   Procedure: MAXILLARY ANTROSTOMY;  Surgeon: Bud Face, MD;  Location: [facility] ORS;  Service: ENT;  Laterality: Bilateral;  . NASAL SEPTOPLASTY W/ TURBINOPLASTY Bilateral 05/07/2018   Procedure: NASAL SEPTOPLASTY WITH TURBINATE REDUCTION;  Surgeon: Bud Face, MD;  Location: [facility] ORS;  Service: ENT;  Laterality: Bilateral;  . SPHENOIDECTOMY Right 05/07/2018   Procedure: SPHENOIDECTOMY;  Surgeon: Bud Face, MD;  Location: [facility] ORS;  Service: ENT;  Laterality: Right;  . TEMPOROMANDIBULAR JOINT SURGERY    . WISDOM TOOTH EXTRACTION      Prior to Admission medications   Medication Sig Start Date End Date Taking? Authorizing Provider  acetaminophen (TYLENOL) 325 MG tablet Take 650 mg by mouth every 6 (six) hours as needed for mild pain or headache.    [provider]  benzonatate (TESSALON) 100 MG capsule Take 2 capsules (200 mg total) by mouth every 8 (eight) hours. 05/15/20   Becky Augusta, NP  busPIRone (BUSPAR) 7.5 MG tablet Take 7.5 mg by mouth at bedtime.    [provider]  citalopram (CELEXA) 40 MG tablet TAKE 1 TABLET BY MOUTH EVERYDAY AT BEDTIME 03/29/20   Johnson, Megan P, DO  famotidine (PEPCID) 20 MG tablet Take 20 mg by mouth at bedtime. 02/14/20   [provider]  norethindrone-ethinyl estradiol (LOESTRIN) 1-20 MG-MCG tablet Take by mouth. 03/17/20 03/17/21  [provider]  promethazine-dextromethorphan (PROMETHAZINE-DM) 6.25-15 MG/5ML syrup Take 5 mLs by mouth 4 (four) times daily as needed. 05/15/20   Becky Augusta, NP  fluticasone (FLONASE) 50 MCG/ACT nasal spray Place 2 sprays into both nostrils at bedtime.  05/15/20  [provider]    Family History  Problem Relation Age of Onset  . Breast cancer Paternal Aunt        pat great aunt  . Heart disease Maternal Grandmother   . Heart disease Maternal Grandfather   . Heart disease Paternal Grandmother   . Heart disease Paternal  Grandfather   . Anxiety disorder Mother   . Depression Mother   . Miscarriages / [place] Sister      Social History   Tobacco Use  . Smoking status: Never Smoker  . Smokeless tobacco: Never Used  Vaping Use  . Vaping Use: Never used  Substance Use Topics  . Alcohol use: Not Currently  . Drug use: No    Allergies as of 06/28/2020 - Review Complete 05/15/2020  Allergen Reaction Noted  . Hydromorphone Shortness Of Breath, Nausea And Vomiting, and Other (See Comments) 10/28/2018  . Hydrocodone Nausea And Vomiting and Other (See Comments) 11/17/2014  . Morphine and related Nausea And Vomiting and Other (See Comments) 04/27/2018  . Nsaids Other (See Comments) 11/17/2014  . Amoxicillin Itching 11/17/2014    Review of Systems:    All systems reviewed and negative except where noted in HPI.   Physical Exam:  There were no vitals taken for this visit. No LMP recorded. General:   Alert,  Well-developed, well-nourished, pleasant and cooperative in NAD Head:  Normocephalic and atraumatic.  Eyes:  Sclera clear, no icterus.   Conjunctiva pink. Ears:  Normal auditory acuity. Neck:  Supple; no masses or thyromegaly. Lungs:  Respirations even and unlabored.  Clear throughout to auscultation.   No wheezes, crackles, or rhonchi. No acute distress. Heart:  Regular rate and rhythm; no murmurs, clicks, rubs, or gallops. Abdomen:  Normal bowel sounds.  No bruits.  Soft, non-tender and non-distended without masses, hepatosplenomegaly or hernias noted.  No guarding or rebound tenderness.  Negative Carnett sign.   Rectal:  Deferred.  Pulses:  Normal pulses noted. Extremities:  No clubbing or edema.  No cyanosis. Neurologic:  Alert and oriented x3;  grossly normal neurologically. Skin:  Intact without significant lesions or rashes.  No jaundice. Lymph Nodes:  No significant cervical adenopathy. Psych:  Alert and cooperative. Normal mood and affect.  Imaging Studies: No results  found.  Assessment and Plan:   Kimberly Santiago is a 44 y.o. y/o female who comes today with a history of an admission to the hospital for abdominal pain.  The patient had some inflammation in the colon that was thought to be reactive from the ovaries. The patient called her mother during the office visit and found out that the patient's mother has a history of adenomatous polyps and gets a colonoscopy every 5 years.  The patient has never had a colonoscopy.  Her imaging did show a possible stricture at the jejunal jejunal anastomosis.  The patient will be set up for an EGD and colonoscopy to look for source of her abdominal discomfort and due to her family history of colon polyps and her history of Barrett's.  The patient will also have her acid reduction medications refilled.  She has been told that the anastomosis that showed narrowing may not be reachable by an endoscope but if we can get to it and it needs dilation then we will attempt that.  The patient has also been told that there is a possibility that the remaining pouch has fistulized to the diverted stomach giving her this sensation that she has not had gastric bypass surgery.  She has been assured that this should be seen on an upper endoscopy.  The patient will follow up at time of the EGD and colonoscopy.  The patient has been explained the plan and agrees with it.    Midge Minium, MD. Clementeen Graham    Note: This dictation was prepared with Dragon dictation along with smaller phrase technology. Any transcriptional errors that result from this process are unintentional.

## 2020-06-29 ENCOUNTER — Encounter: Payer: Self-pay | Admitting: Gastroenterology

## 2020-06-29 ENCOUNTER — Other Ambulatory Visit: Payer: Self-pay

## 2020-07-05 ENCOUNTER — Other Ambulatory Visit
Admission: RE | Admit: 2020-07-05 | Discharge: 2020-07-05 | Disposition: A | Discharge status: Home / Self Care | Payer: BC Managed Care – PPO | Source: Ambulatory Visit | Attending: Gastroenterology | Admitting: Gastroenterology

## 2020-07-05 ENCOUNTER — Other Ambulatory Visit: Payer: Self-pay

## 2020-07-05 DIAGNOSIS — Z880 Allergy status to penicillin: Secondary | ICD-10-CM | POA: Diagnosis not present

## 2020-07-05 DIAGNOSIS — Z01812 Encounter for preprocedural laboratory examination: Secondary | ICD-10-CM | POA: Insufficient documentation

## 2020-07-05 DIAGNOSIS — Z793 Long term (current) use of hormonal contraceptives: Secondary | ICD-10-CM | POA: Diagnosis not present

## 2020-07-05 DIAGNOSIS — Z20822 Contact with and (suspected) exposure to covid-19: Secondary | ICD-10-CM | POA: Insufficient documentation

## 2020-07-05 DIAGNOSIS — Z9884 Bariatric surgery status: Secondary | ICD-10-CM | POA: Diagnosis not present

## 2020-07-05 DIAGNOSIS — K640 First degree hemorrhoids: Secondary | ICD-10-CM | POA: Diagnosis not present

## 2020-07-05 DIAGNOSIS — Z8719 Personal history of other diseases of the digestive system: Secondary | ICD-10-CM | POA: Diagnosis not present

## 2020-07-05 DIAGNOSIS — Z1211 Encounter for screening for malignant neoplasm of colon: Secondary | ICD-10-CM | POA: Diagnosis not present

## 2020-07-05 DIAGNOSIS — K449 Diaphragmatic hernia without obstruction or gangrene: Secondary | ICD-10-CM | POA: Diagnosis not present

## 2020-07-05 DIAGNOSIS — Z79899 Other long term (current) drug therapy: Secondary | ICD-10-CM | POA: Diagnosis not present

## 2020-07-05 DIAGNOSIS — Z885 Allergy status to narcotic agent status: Secondary | ICD-10-CM | POA: Diagnosis not present

## 2020-07-05 MED ORDER — SUPREP BOWEL PREP KIT 17.5-3.13-1.6 GM/177ML PO SOLN: 1.0000 | ORAL | 0 refills | Status: DC

## 2020-07-06 LAB — SARS CORONAVIRUS 2 (TAT 6-24 HRS): SARS Coronavirus 2: NEGATIVE

## 2020-07-07 ENCOUNTER — Other Ambulatory Visit: Payer: Self-pay

## 2020-07-07 ENCOUNTER — Ambulatory Visit: Payer: BC Managed Care – PPO | Admitting: Anesthesiology

## 2020-07-07 ENCOUNTER — Ambulatory Visit
Admission: RE | Admit: 2020-07-07 | Discharge: 2020-07-07 | Disposition: A | Discharge status: Home / Self Care | Payer: BC Managed Care – PPO | Attending: Gastroenterology | Admitting: Gastroenterology

## 2020-07-07 ENCOUNTER — Encounter
Admission: RE | Disposition: A | Discharge status: Home / Self Care | Payer: Self-pay | Source: Home / Self Care | Attending: Gastroenterology

## 2020-07-07 ENCOUNTER — Encounter: Payer: Self-pay | Admitting: Gastroenterology

## 2020-07-07 DIAGNOSIS — K449 Diaphragmatic hernia without obstruction or gangrene: Secondary | ICD-10-CM | POA: Diagnosis not present

## 2020-07-07 DIAGNOSIS — K2270 Barrett's esophagus without dysplasia: Secondary | ICD-10-CM | POA: Diagnosis not present

## 2020-07-07 DIAGNOSIS — Z9884 Bariatric surgery status: Secondary | ICD-10-CM | POA: Insufficient documentation

## 2020-07-07 DIAGNOSIS — Z79899 Other long term (current) drug therapy: Secondary | ICD-10-CM | POA: Insufficient documentation

## 2020-07-07 DIAGNOSIS — Z793 Long term (current) use of hormonal contraceptives: Secondary | ICD-10-CM | POA: Insufficient documentation

## 2020-07-07 DIAGNOSIS — Z1211 Encounter for screening for malignant neoplasm of colon: Secondary | ICD-10-CM | POA: Diagnosis not present

## 2020-07-07 DIAGNOSIS — Z885 Allergy status to narcotic agent status: Secondary | ICD-10-CM | POA: Diagnosis not present

## 2020-07-07 DIAGNOSIS — Z8719 Personal history of other diseases of the digestive system: Secondary | ICD-10-CM | POA: Diagnosis not present

## 2020-07-07 DIAGNOSIS — R1084 Generalized abdominal pain: Secondary | ICD-10-CM

## 2020-07-07 DIAGNOSIS — Z880 Allergy status to penicillin: Secondary | ICD-10-CM | POA: Insufficient documentation

## 2020-07-07 DIAGNOSIS — Z20822 Contact with and (suspected) exposure to covid-19: Secondary | ICD-10-CM | POA: Diagnosis not present

## 2020-07-07 DIAGNOSIS — K640 First degree hemorrhoids: Secondary | ICD-10-CM | POA: Insufficient documentation

## 2020-07-07 DIAGNOSIS — Z8371 Family history of colonic polyps: Secondary | ICD-10-CM

## 2020-07-07 HISTORY — PX: COLONOSCOPY WITH PROPOFOL: SHX5780

## 2020-07-07 HISTORY — PX: ESOPHAGOGASTRODUODENOSCOPY (EGD) WITH PROPOFOL: SHX5813

## 2020-07-07 LAB — POCT PREGNANCY, URINE: Preg Test, Ur: NEGATIVE

## 2020-07-07 SURGERY — COLONOSCOPY WITH PROPOFOL
Anesthesia: General

## 2020-07-07 MED ORDER — SCOPOLAMINE 1 MG/3DAYS TD PT72
1.0000 | MEDICATED_PATCH | Freq: Once | TRANSDERMAL | Status: DC
  Administered 2020-07-07: 1.5 mg via TRANSDERMAL

## 2020-07-07 MED ORDER — GLYCOPYRROLATE 0.2 MG/ML IJ SOLN
INTRAMUSCULAR | Status: DC | PRN
  Administered 2020-07-07: .1 mg via INTRAVENOUS

## 2020-07-07 MED ORDER — LACTATED RINGERS IV SOLN: INTRAVENOUS | Status: DC

## 2020-07-07 MED ORDER — LIDOCAINE HCL (CARDIAC) PF 100 MG/5ML IV SOSY
PREFILLED_SYRINGE | INTRAVENOUS | Status: DC | PRN
  Administered 2020-07-07: 30 mg via INTRAVENOUS

## 2020-07-07 MED ORDER — PROPOFOL 10 MG/ML IV BOLUS
INTRAVENOUS | Status: DC | PRN
  Administered 2020-07-07: 40 mg via INTRAVENOUS
  Administered 2020-07-07: 50 mg via INTRAVENOUS
  Administered 2020-07-07 (×3): 40 mg via INTRAVENOUS
  Administered 2020-07-07: 150 mg via INTRAVENOUS

## 2020-07-07 MED ORDER — STERILE WATER FOR IRRIGATION IR SOLN: Status: DC | PRN

## 2020-07-07 MED ORDER — SODIUM CHLORIDE 0.9 % IV SOLN: INTRAVENOUS | Status: DC

## 2020-07-07 SURGICAL SUPPLY — 37 items
BALLN DILATOR 10-12 8 (BALLOONS)
BALLN DILATOR 12-15 8 (BALLOONS)
BALLN DILATOR 15-18 8 (BALLOONS)
BALLN DILATOR CRE 0-12 8 (BALLOONS)
BALLN DILATOR ESOPH 8 10 CRE (MISCELLANEOUS) IMPLANT
BALLOON DILATOR 12-15 8 (BALLOONS) IMPLANT
BALLOON DILATOR 15-18 8 (BALLOONS) IMPLANT
BALLOON DILATOR CRE 0-12 8 (BALLOONS) IMPLANT
BLOCK BITE 60FR ADLT L/F GRN (MISCELLANEOUS) ×2 IMPLANT
CLIP HMST 235XBRD CATH ROT (MISCELLANEOUS) IMPLANT
CLIP RESOLUTION 360 11X235 (MISCELLANEOUS)
ELECT REM PT RETURN 9FT ADLT (ELECTROSURGICAL)
ELECTRODE REM PT RTRN 9FT ADLT (ELECTROSURGICAL) IMPLANT
FCP ESCP3.2XJMB 240X2.8X (MISCELLANEOUS)
FORCEPS BIOP RAD 4 LRG CAP 4 (CUTTING FORCEPS) IMPLANT
FORCEPS BIOP RJ4 240 W/NDL (MISCELLANEOUS)
FORCEPS ESCP3.2XJMB 240X2.8X (MISCELLANEOUS) IMPLANT
GOWN CVR UNV OPN BCK APRN NK (MISCELLANEOUS) ×2 IMPLANT
GOWN ISOL THUMB LOOP REG UNIV (MISCELLANEOUS) ×4
INJECTOR VARIJECT VIN23 (MISCELLANEOUS) IMPLANT
KIT DEFENDO VALVE AND CONN (KITS) IMPLANT
KIT PRC NS LF DISP ENDO (KITS) ×1 IMPLANT
KIT PROCEDURE OLYMPUS (KITS) ×2
MANIFOLD NEPTUNE II (INSTRUMENTS) ×2 IMPLANT
MARKER SPOT ENDO TATTOO 5ML (MISCELLANEOUS) IMPLANT
PROBE APC STR FIRE (PROBE) IMPLANT
RETRIEVER NET PLAT FOOD (MISCELLANEOUS) IMPLANT
RETRIEVER NET ROTH 2.5X230 LF (MISCELLANEOUS) IMPLANT
SNARE SHORT THROW 13M SML OVAL (MISCELLANEOUS) IMPLANT
SNARE SHORT THROW 30M LRG OVAL (MISCELLANEOUS) IMPLANT
SNARE SNG USE RND 15MM (INSTRUMENTS) IMPLANT
SPOT EX ENDOSCOPIC TATTOO (MISCELLANEOUS)
SYR INFLATION 60ML (SYRINGE) IMPLANT
TRAP ETRAP POLY (MISCELLANEOUS) IMPLANT
VARIJECT INJECTOR VIN23 (MISCELLANEOUS)
WATER STERILE IRR 250ML POUR (IV SOLUTION) ×2 IMPLANT
WIRE CRE 18-20MM 8CM F G (MISCELLANEOUS) IMPLANT

## 2020-07-07 NOTE — H&P (Signed)
Lucilla Lame, MD [facility] [address] [place], [place] [address] Phone:[phone] Fax : [phone]  Primary Care Physician:  Valerie Roys, DO Primary Gastroenterologist:  Dr. Allen Norris  Pre-Procedure History & Physical: HPI:  Kimberly Santiago is a 45 y.o. female is here for an endoscopy and colonoscopy.   Past Medical History:  Diagnosis Date  . Anemia   . Asthma   . Barrett esophagus   . Complication of anesthesia    Pt stopped breathing during EGD but did well with recent Gastric Bypass Surgery  . Depression   . GERD (gastroesophageal reflux disease)    H/O  . Headache    H/O MIGRAINES  . History of hiatal hernia    SMALL  . PCOS (polycystic ovarian syndrome)   . PONV (postoperative nausea and vomiting)   . Sleep apnea    (mild) PT IS NOT USING CPAP    Past Surgical History:  Procedure Laterality Date  . CESAREAN SECTION N/A 08/18/2017   Procedure: Primary CESAREAN SECTION;  Surgeon: Aloha Gell, MD;  Location: [place];  Service: Obstetrics;  Laterality: N/A;  EDD: 09/13/17 Allergy: Amoxicillin, Morphine, Hydrocodone  . CESAREAN SECTION WITH BILATERAL TUBAL LIGATION Bilateral 07/27/2019   Procedure: Repeat CESAREAN SECTION WITH BILATERAL TUBAL LIGATION;  Surgeon: Aloha Gell, MD;  Location: [place] [facility] ORS;  Service: Obstetrics;  Laterality: Bilateral;  EDD: 07/31/19  . CHOLECYSTECTOMY    . DIAGNOSTIC LAPAROSCOPY    . EAR CYST EXCISION N/A 11/24/2014   Procedure: CYST REMOVAL;  Surgeon: Carloyn Manner, MD;  Location: [facility] ORS;  Service: ENT;  Laterality: N/A;  upper lip  . ESOPHAGOGASTRODUODENOSCOPY    . ETHMOIDECTOMY Right 05/07/2018   Procedure: ETHMOIDECTOMY;  Surgeon: Carloyn Manner, MD;  Location: [facility] ORS;  Service: ENT;  Laterality: Right;  . FRONTAL SINUS EXPLORATION Right 05/07/2018   Procedure: FRONTAL SINUS EXPLORATION;  Surgeon: Carloyn Manner, MD;  Location: [facility] ORS;  Service: ENT;  Laterality: Right;  . GASTRIC BYPASS  March  2016  . IMAGE GUIDED SINUS SURGERY N/A 05/07/2018   Procedure: IMAGE GUIDED SINUS SURGERY;  Surgeon: Carloyn Manner, MD;  Location: [facility] ORS;  Service: ENT;  Laterality: N/A;  . KNEE ARTHROSCOPY Right   . MAXILLARY ANTROSTOMY Bilateral 05/07/2018   Procedure: MAXILLARY ANTROSTOMY;  Surgeon: Carloyn Manner, MD;  Location: [facility] ORS;  Service: ENT;  Laterality: Bilateral;  . NASAL SEPTOPLASTY W/ TURBINOPLASTY Bilateral 05/07/2018   Procedure: NASAL SEPTOPLASTY WITH TURBINATE REDUCTION;  Surgeon: Carloyn Manner, MD;  Location: [facility] ORS;  Service: ENT;  Laterality: Bilateral;  . SPHENOIDECTOMY Right 05/07/2018   Procedure: SPHENOIDECTOMY;  Surgeon: Carloyn Manner, MD;  Location: [facility] ORS;  Service: ENT;  Laterality: Right;  . TEMPOROMANDIBULAR JOINT SURGERY    . WISDOM TOOTH EXTRACTION      Prior to Admission medications   Medication Sig Start Date End Date Taking? Authorizing Provider  busPIRone (BUSPAR) 7.5 MG tablet Take 7.5 mg by mouth at bedtime.   Yes [provider]  citalopram (CELEXA) 40 MG tablet TAKE 1 TABLET BY MOUTH EVERYDAY AT BEDTIME 03/29/20  Yes Johnson, Megan P, DO  famotidine (PEPCID) 20 MG tablet Take 20 mg by mouth at bedtime. 02/14/20  Yes [provider]  loratadine (CLARITIN) 10 MG tablet Take 10 mg by mouth daily as needed for allergies.   Yes [provider]  norethindrone-ethinyl estradiol (LOESTRIN) 1-20 MG-MCG tablet Take by mouth. 03/17/20 03/17/21 Yes [provider]  pantoprazole (PROTONIX) 40 MG tablet Take 40 mg  by mouth daily. 05/30/20  Yes [provider]  Sodium Sulfate-Mag Sulfate-KCl (SUTAB) [phone] MG TABS Take 376 mg by mouth as directed. 06/28/20  Yes Lucilla Lame, MD  fluticasone (FLONASE) 50 MCG/ACT nasal spray Place 2 sprays into both nostrils at bedtime.  05/15/20 Yes [provider]  acetaminophen (TYLENOL) 325 MG tablet Take 650 mg by mouth every 6 (six) hours as needed for mild pain or  headache.    [provider]  Na Sulfate-K Sulfate-Mg Sulf (SUPREP BOWEL PREP KIT) 17.5-3.13-1.6 GM/177ML SOLN Take 1 kit by mouth as directed. 07/05/20   Lucilla Lame, MD    Allergies as of 06/28/2020 - Review Complete 06/28/2020  Allergen Reaction Noted  . Hydromorphone Shortness Of Breath, Nausea And Vomiting, and Other (See Comments) 10/28/2018  . Hydrocodone Nausea And Vomiting and Other (See Comments) 11/17/2014  . Morphine and related Nausea And Vomiting and Other (See Comments) 04/27/2018  . Nsaids Other (See Comments) 11/17/2014  . Amoxicillin Itching 11/17/2014    Family History  Problem Relation Age of Onset  . Breast cancer Paternal Aunt        pat great aunt  . Heart disease Maternal Grandmother   . Heart disease Maternal Grandfather   . Heart disease Paternal Grandmother   . Heart disease Paternal Grandfather   . Anxiety disorder Mother   . Depression Mother   . Miscarriages / [place] Sister     Social History   Socioeconomic History  . Marital status: Married    Spouse name: Not on file  . Number of children: Not on file  . Years of education: Not on file  . Highest education level: Not on file  Occupational History  . Not on file  Tobacco Use  . Smoking status: Never Smoker  . Smokeless tobacco: Never Used  Vaping Use  . Vaping Use: Never used  Substance and Sexual Activity  . Alcohol use: Not Currently  . Drug use: No  . Sexual activity: Yes  Other Topics Concern  . Not on file  Social History Narrative  . Not on file   Social Determinants of Health   Financial Resource Strain: Not on file  Food Insecurity: Not on file  Transportation Needs: Not on file  Physical Activity: Not on file  Stress: Not on file  Social Connections: Not on file  Intimate Partner Violence: Not on file    Review of Systems: See HPI, otherwise negative ROS  Physical Exam: BP 126/62   Pulse 79   Temp 97.8 F (36.6 C) (Temporal)   Resp 16   Ht 5'  6" (1.676 m)   Wt 132.9 kg   LMP 06/01/2020 (Exact Date)   SpO2 98%   BMI 47.29 kg/m  General:   Alert,  pleasant and cooperative in NAD Head:  Normocephalic and atraumatic. Neck:  Supple; no masses or thyromegaly. Lungs:  Clear throughout to auscultation.    Heart:  Regular rate and rhythm. Abdomen:  Soft, nontender and nondistended. Normal bowel sounds, without guarding, and without rebound.   Neurologic:  Alert and  oriented x4;  grossly normal neurologically.  Impression/Plan: Kimberly Santiago is here for an endoscopy and colonoscopy to be performed for screening and Barrett's esophagus  Risks, benefits, limitations, and alternatives regarding  endoscopy and colonoscopy have been reviewed with the patient.  Questions have been answered.  All parties agreeable.   Lucilla Lame, MD  07/07/2020, 7:23 AM

## 2020-07-07 NOTE — Anesthesia Procedure Notes (Signed)
Date/Time: 07/07/2020 8:02 AM Performed by: Maree Krabbe, CRNA Pre-anesthesia Checklist: Patient identified, Emergency Drugs available, Suction available, Timeout performed and Patient being monitored Patient Re-evaluated:Patient Re-evaluated prior to induction Oxygen Delivery Method: Nasal cannula Placement Confirmation: positive ETCO2

## 2020-07-07 NOTE — Discharge Instructions (Signed)
General Anesthesia, Adult, Care After °This sheet gives you information about how to care for yourself after your procedure. Your health care provider may also give you more specific instructions. If you have problems or questions, contact your health care provider. °What can I expect after the procedure? °After the procedure, the following side effects are common: °· Pain or discomfort at the IV site. °· Nausea. °· Vomiting. °· Sore throat. °· Trouble concentrating. °· Feeling cold or chills. °· Weak or tired. °· Sleepiness and fatigue. °· Soreness and body aches. These side effects can affect parts of the body that were not involved in surgery. °Follow these instructions at home: ° °For at least 24 hours after the procedure: °· Have a responsible adult stay with you. It is important to have someone help care for you until you are awake and alert. °· Rest as needed. °· Do not: °? Participate in activities in which you could fall or become injured. °? Drive. °? Use heavy machinery. °? Drink alcohol. °? Take sleeping pills or medicines that cause drowsiness. °? Make important decisions or sign legal documents. °? Take care of children on your own. °Eating and drinking °· Follow any instructions from your health care provider about eating or drinking restrictions. °· When you feel hungry, start by eating small amounts of foods that are soft and easy to digest (bland), such as toast. Gradually return to your regular diet. °· Drink enough fluid to keep your urine pale yellow. °· If you vomit, rehydrate by drinking water, juice, or clear broth. °General instructions °· If you have sleep apnea, surgery and certain medicines can increase your risk for breathing problems. Follow instructions from your health care provider about wearing your sleep device: °? Anytime you are sleeping, including during daytime naps. °? While taking prescription pain medicines, sleeping medicines, or medicines that make you drowsy. °· Return to  your normal activities as told by your health care provider. Ask your health care provider what activities are safe for you. °· Take over-the-counter and prescription medicines only as told by your health care provider. °· If you smoke, do not smoke without supervision. °· Keep all follow-up visits as told by your health care provider. This is important. °Contact a health care provider if: °· You have nausea or vomiting that does not get better with medicine. °· You cannot eat or drink without vomiting. °· You have pain that does not get better with medicine. °· You are unable to pass urine. °· You develop a skin rash. °· You have a fever. °· You have redness around your IV site that gets worse. °Get help right away if: °· You have difficulty breathing. °· You have chest pain. °· You have blood in your urine or stool, or you vomit blood. °Summary °· After the procedure, it is common to have a sore throat or nausea. It is also common to feel tired. °· Have a responsible adult stay with you for the first 24 hours after general anesthesia. It is important to have someone help care for you until you are awake and alert. °· When you feel hungry, start by eating small amounts of foods that are soft and easy to digest (bland), such as toast. Gradually return to your regular diet. °· Drink enough fluid to keep your urine pale yellow. °· Return to your normal activities as told by your health care provider. Ask your health care provider what activities are safe for you. °This information is not   intended to replace advice given to you by your health care provider. Make sure you discuss any questions you have with your health care provider. Document Revised: 06/20/2017 Document Reviewed: 01/31/2017 Elsevier Patient Education  2020 [employer]  Scopolamine skin patches REMOVE PATCH IN 72 HOURS AND WASH HANDS IMMEDIATELY  What is this medicine? SCOPOLAMINE (skoe POL a meen) is used to prevent nausea and vomiting  caused by motion sickness, anesthesia and surgery. This medicine may be used for other purposes; ask your health care provider or pharmacist if you have questions. COMMON BRAND NAME(S): Transderm Scop What should I tell my health care provider before I take this medicine? They need to know if you have any of these conditions:  are scheduled to have a gastric secretion test  glaucoma  heart disease  kidney disease  liver disease  lung or breathing disease, like asthma  mental illness  prostate disease  seizures  stomach or intestine problems  trouble passing urine  an unusual or allergic reaction to scopolamine, atropine, other medicines, foods, dyes, or preservatives  pregnant or trying to get pregnant  breast-feeding How should I use this medicine? This medicine is for external use only. Follow the directions on the prescription label. Wear only 1 patch at a time. Choose an area behind the ear, that is clean, dry, hairless and free from any cuts or irritation. Wipe the area with a clean dry tissue. Peel off the plastic backing of the skin patch, trying not to touch the adhesive side with your hands. Do not cut the patches. Firmly apply to the area you have chosen, with the metallic side of the patch to the skin and the tan-colored side showing. Once firmly in place, wash your hands well with soap and water. Do not get this medicine into your eyes. After removing the patch, wash your hands and the area behind your ear thoroughly with soap and water. The patch will still contain some medicine after use. To avoid accidental contact or ingestion by children or pets, fold the used patch in half with the sticky side together and throw away in the trash out of the reach of children and pets. If you need to use a second patch after you remove the first, place it behind the other ear. A special MedGuide will be given to you by the pharmacist with each prescription and refill. Be sure to  read this information carefully each time. Talk to your pediatrician regarding the use of this medicine in children. Special care may be needed. Overdosage: If you think you have taken too much of this medicine contact a poison control center or emergency room at once. NOTE: This medicine is only for you. Do not share this medicine with others. What if I miss a dose? This does not apply. This medicine is not for regular use. What may interact with this medicine?  alcohol  antihistamines for allergy cough and cold  atropine  certain medicines for anxiety or sleep  certain medicines for bladder problems like oxybutynin, tolterodine  certain medicines for depression like amitriptyline, fluoxetine, sertraline  certain medicines for stomach problems like dicyclomine, hyoscyamine  certain medicines for Parkinson's disease like benztropine, trihexyphenidyl  certain medicines for seizures like phenobarbital, primidone  general anesthetics like halothane, isoflurane, methoxyflurane, propofol  ipratropium  local anesthetics like lidocaine, pramoxine, tetracaine  medicines that relax muscles for surgery  phenothiazines like chlorpromazine, mesoridazine, prochlorperazine, thioridazine  narcotic medicines for pain  other belladonna alkaloids This list may  not describe all possible interactions. Give your health care provider a list of all the medicines, herbs, non-prescription drugs, or dietary supplements you use. Also tell them if you smoke, drink alcohol, or use illegal drugs. Some items may interact with your medicine. What should I watch for while using this medicine? Limit contact with water while swimming and bathing because the patch may fall off. If the patch falls off, throw it away and put a new one behind the other ear. You may get drowsy or dizzy. Do not drive, use machinery, or do anything that needs mental alertness until you know how this medicine affects you. Do not  stand or sit up quickly, especially if you are an older patient. This reduces the risk of dizzy or fainting spells. Alcohol may interfere with the effect of this medicine. Avoid alcoholic drinks. Your mouth may get dry. Chewing sugarless gum or sucking hard candy, and drinking plenty of water may help. Contact your healthcare professional if the problem does not go away or is severe. This medicine may cause dry eyes and blurred vision. If you wear contact lenses, you may feel some discomfort. Lubricating drops may help. See your healthcare professional if the problem does not go away or is severe. If you are going to need surgery, an MRI, CT scan, or other procedure, tell your healthcare professional that you are using this medicine. You may need to remove the patch before the procedure. What side effects may I notice from receiving this medicine? Side effects that you should report to your doctor or health care professional as soon as possible:  allergic reactions like skin rash, itching or hives; swelling of the face, lips, or tongue  blurred vision  changes in vision  confusion  dizziness  eye pain  fast, irregular heartbeat  hallucinations, loss of contact with reality  nausea, vomiting  pain or trouble passing urine  restlessness  seizures  skin irritation  stomach pain Side effects that usually do not require medical attention (report to your doctor or health care professional if they continue or are bothersome):  drowsiness  dry mouth  headache  sore throat This list may not describe all possible side effects. Call your doctor for medical advice about side effects. You may report side effects to FDA at 1-800-FDA-1088. Where should I keep my medicine? Keep out of the reach of children. Store at room temperature between 20 and 25 degrees C (68 and 77 degrees F). Keep this medicine in the foil package until ready to use. Throw away any unused medicine after the  expiration date. NOTE: This sheet is a summary. It may not cover all possible information. If you have questions about this medicine, talk to your doctor, pharmacist, or health care provider.  2020 Elsevier/Gold Standard (2017-09-05 16:14:46)

## 2020-07-07 NOTE — Transfer of Care (Signed)
Immediate Anesthesia Transfer of Care Note  Patient: Kimberly Santiago  Procedure(s) Performed: COLONOSCOPY WITH PROPOFOL (N/A ) ESOPHAGOGASTRODUODENOSCOPY (EGD) WITH PROPOFOL (N/A )  Patient Location: PACU  Anesthesia Type: General  Level of Consciousness: awake, alert  and patient cooperative  Airway and Oxygen Therapy: Patient Spontanous Breathing and Patient connected to supplemental oxygen  Post-op Assessment: Post-op Vital signs reviewed, Patient's Cardiovascular Status Stable, Respiratory Function Stable, Patent Airway and No signs of Nausea or vomiting  Post-op Vital Signs: Reviewed and stable  Complications: No complications documented.

## 2020-07-07 NOTE — Anesthesia Preprocedure Evaluation (Signed)
Anesthesia Evaluation  Patient identified by MRN, date of birth, ID band Patient awake    Reviewed: Allergy & Precautions, H&P , NPO status , Patient's Chart, lab work & pertinent test results, reviewed documented beta blocker date and time   History of Anesthesia Complications (+) PONV and history of anesthetic complications  Airway Mallampati: II  TM Distance: >3 FB Neck ROM: full    Dental no notable dental hx.    Pulmonary asthma , sleep apnea ,    Pulmonary exam normal breath sounds clear to auscultation       Cardiovascular Exercise Tolerance: Good negative cardio ROS   Rhythm:regular Rate:Normal     Neuro/Psych  Headaches, PSYCHIATRIC DISORDERS Anxiety Depression    GI/Hepatic Neg liver ROS, hiatal hernia, GERD  ,  Endo/Other  negative endocrine ROS  Renal/GU negative Renal ROS  negative genitourinary   Musculoskeletal   Abdominal   Peds  Hematology  (+) Blood dyscrasia, anemia ,   Anesthesia Other Findings   Reproductive/Obstetrics negative OB ROS                             Anesthesia Physical Anesthesia Plan  ASA: II  Anesthesia Plan: General   Post-op Pain Management:    Induction:   PONV Risk Score and Plan: 4 or greater and Treatment may vary due to age or medical condition, Propofol infusion and TIVA  Airway Management Planned:   Additional Equipment:   Intra-op Plan:   Post-operative Plan:   Informed Consent: I have reviewed the patients History and Physical, chart, labs and discussed the procedure including the risks, benefits and alternatives for the proposed anesthesia with the patient or authorized representative who has indicated his/her understanding and acceptance.     Dental Advisory Given  Plan Discussed with: CRNA  Anesthesia Plan Comments:         Anesthesia Quick Evaluation

## 2020-07-07 NOTE — Op Note (Signed)
[facility] Patient Name: Kimberly Santiago Procedure Date: 07/07/2020 8:01 AM MRN: [id number] Account #: [id number] Date of Birth: Dec 14, 1975 Admit Type: Outpatient Age: 45 Room: [facility] OR ROOM 01 Gender: Female Note Status: Finalized Procedure:             Colonoscopy Indications:           Screening for colorectal malignant neoplasm Providers:             Midge Minium MD, MD Referring MD:          Dorcas Carrow (Referring MD) Medicines:             Propofol per Anesthesia Complications:         No immediate complications. Procedure:             Pre-Anesthesia Assessment:                        - Prior to the procedure, a History and Physical was                         performed, and patient medications and allergies were                         reviewed. The patient's tolerance of previous                         anesthesia was also reviewed. The risks and benefits                         of the procedure and the sedation options and risks                         were discussed with the patient. All questions were                         answered, and informed consent was obtained. Prior                         Anticoagulants: The patient has taken no previous                         anticoagulant or antiplatelet agents. ASA Grade                         Assessment: II - A patient with mild systemic disease.                         After reviewing the risks and benefits, the patient                         was deemed in satisfactory condition to undergo the                         procedure.                        After obtaining informed consent, the colonoscope was  passed under direct vision. Throughout the procedure,                         the patient's blood pressure, pulse, and oxygen                         saturations were monitored continuously. The                         Colonoscope was introduced through the anus  and                         advanced to the the cecum, identified by appendiceal                         orifice and ileocecal valve. The colonoscopy was                         performed without difficulty. The patient tolerated                         the procedure well. The quality of the bowel                         preparation was excellent. Findings:      The perianal and digital rectal examinations were normal.      Non-bleeding internal hemorrhoids were found during retroflexion. The       hemorrhoids were Grade I (internal hemorrhoids that do not prolapse). Impression:            - Non-bleeding internal hemorrhoids.                        - No specimens collected. Recommendation:        - Discharge patient to home.                        - Resume previous diet.                        - Continue present medications.                        - Repeat colonoscopy in 10 years for screening unless                         any change in family history or lower GI problems. Procedure Code(s):     --- Professional ---                        [phone], Colonoscopy, flexible; diagnostic, including                         collection of specimen(s) by brushing or washing, when                         performed (separate procedure) Diagnosis Code(s):     --- Professional ---                        Z12.11, Encounter for  screening for malignant neoplasm                         of colon CPT copyright 2019 American Medical Association. All rights reserved. The codes documented in this report are preliminary and upon coder review may  be revised to meet current compliance requirements. Midge Minium MD, MD 07/07/2020 8:27:05 AM This report has been signed electronically. Number of Addenda: 0 Note Initiated On: 07/07/2020 8:01 AM Scope Withdrawal Time: 0 hours 6 minutes 45 seconds  Total Procedure Duration: 0 hours 10 minutes 0 seconds  Estimated Blood Loss:  Estimated blood loss: none.      [facility]

## 2020-07-07 NOTE — Anesthesia Postprocedure Evaluation (Signed)
Anesthesia Post Note  Patient: Kimberly Santiago  Procedure(s) Performed: COLONOSCOPY WITH PROPOFOL (N/A ) ESOPHAGOGASTRODUODENOSCOPY (EGD) WITH PROPOFOL (N/A )     Patient location during evaluation: PACU Anesthesia Type: General Level of consciousness: awake and alert Pain management: pain level controlled Vital Signs Assessment: post-procedure vital signs reviewed and stable Respiratory status: spontaneous breathing, nonlabored ventilation, respiratory function stable and patient connected to nasal cannula oxygen Cardiovascular status: blood pressure returned to baseline and stable Postop Assessment: no apparent nausea or vomiting Anesthetic complications: no   No complications documented.  Scarlette Slice

## 2020-07-07 NOTE — Op Note (Signed)
[facility] Gastroenterology Patient Name: Kimberly Santiago Procedure Date: 07/07/2020 8:02 AM MRN: [id number] Account #: [id number] Date of Birth: 1975/10/26 Admit Type: Outpatient Age: 45 Room: [facility] OR ROOM 01 Gender: Female Note Status: Finalized Procedure:             Upper GI endoscopy Indications:           Follow-up of Barrett's esophagus Providers:             Lucilla Lame MD, MD Referring MD:          Valerie Roys (Referring MD) Medicines:             Propofol per Anesthesia Complications:         No immediate complications. Procedure:             Pre-Anesthesia Assessment:                        - Prior to the procedure, a History and Physical was                         performed, and patient medications and allergies were                         reviewed. The patient's tolerance of previous                         anesthesia was also reviewed. The risks and benefits                         of the procedure and the sedation options and risks                         were discussed with the patient. All questions were                         answered, and informed consent was obtained. Prior                         Anticoagulants: The patient has taken no previous                         anticoagulant or antiplatelet agents. ASA Grade                         Assessment: II - A patient with mild systemic disease.                         After reviewing the risks and benefits, the patient                         was deemed in satisfactory condition to undergo the                         procedure.                        After obtaining informed consent, the endoscope was  passed under direct vision. Throughout the procedure,                         the patient's blood pressure, pulse, and oxygen                         saturations were monitored continuously. The Endoscope                         was introduced through the mouth, and  advanced to the                         second part of duodenum. The upper GI endoscopy was                         accomplished without difficulty. The patient tolerated                         the procedure well. Findings:      A small hiatal hernia was present.      Evidence of a gastric bypass was found. A gastric pouch with a small       size was found. The gastrojejunal anastomosis was characterized by       healthy appearing mucosa. This was traversed.      The examined jejunum was normal. Impression:            - Small hiatal hernia.                        - Gastric bypass with a small-sized pouch.                         Gastrojejunal anastomosis characterized by healthy                         appearing mucosa.                        - Normal examined jejunum.                        - No specimens collected.                        - No sign of Barrett's espohagus Recommendation:        - Discharge patient to home.                        - Resume previous diet.                        - Continue present medications.                        - Perform a colonoscopy today. Procedure Code(s):     --- Professional ---                        [phone], Esophagogastroduodenoscopy, flexible,                         transoral; diagnostic, including  collection of                         specimen(s) by brushing or washing, when performed                         (separate procedure) Diagnosis Code(s):     --- Professional ---                        K22.70, Barrett's esophagus without dysplasia                        Z98.84, Bariatric surgery status CPT copyright 2019 American Medical Association. All rights reserved. The codes documented in this report are preliminary and upon coder review may  be revised to meet current compliance requirements. Midge Minium MD, MD 07/07/2020 8:13:12 AM This report has been signed electronically. Number of Addenda: 0 Note Initiated On: 07/07/2020 8:02  AM Estimated Blood Loss:  Estimated blood loss: none.      [facility]

## 2020-08-11 DIAGNOSIS — J324 Chronic pansinusitis: Secondary | ICD-10-CM | POA: Diagnosis not present

## 2020-09-04 DIAGNOSIS — J301 Allergic rhinitis due to pollen: Secondary | ICD-10-CM | POA: Diagnosis not present

## 2020-09-04 DIAGNOSIS — J324 Chronic pansinusitis: Secondary | ICD-10-CM | POA: Diagnosis not present

## 2020-09-18 ENCOUNTER — Other Ambulatory Visit: Payer: Self-pay

## 2020-09-18 ENCOUNTER — Ambulatory Visit (INDEPENDENT_AMBULATORY_CARE_PROVIDER_SITE_OTHER): Payer: BC Managed Care – PPO | Admitting: Family Medicine

## 2020-09-18 ENCOUNTER — Encounter: Payer: Self-pay | Admitting: Family Medicine

## 2020-09-18 ENCOUNTER — Encounter: Payer: BC Managed Care – PPO | Admitting: Family Medicine

## 2020-09-18 VITALS — BP 102/71 | HR 78 | Temp 98.6°F | Ht 66.8 in | Wt 295.0 lb

## 2020-09-18 DIAGNOSIS — B372 Candidiasis of skin and nail: Secondary | ICD-10-CM

## 2020-09-18 DIAGNOSIS — K219 Gastro-esophageal reflux disease without esophagitis: Secondary | ICD-10-CM

## 2020-09-18 DIAGNOSIS — Z6841 Body Mass Index (BMI) 40.0 and over, adult: Secondary | ICD-10-CM

## 2020-09-18 DIAGNOSIS — Z0000 Encounter for general adult medical examination without abnormal findings: Secondary | ICD-10-CM | POA: Diagnosis not present

## 2020-09-18 DIAGNOSIS — Z136 Encounter for screening for cardiovascular disorders: Secondary | ICD-10-CM | POA: Diagnosis not present

## 2020-09-18 DIAGNOSIS — F419 Anxiety disorder, unspecified: Secondary | ICD-10-CM | POA: Diagnosis not present

## 2020-09-18 DIAGNOSIS — M25552 Pain in left hip: Secondary | ICD-10-CM

## 2020-09-18 DIAGNOSIS — F331 Major depressive disorder, recurrent, moderate: Secondary | ICD-10-CM

## 2020-09-18 DIAGNOSIS — Z0100 Encounter for examination of eyes and vision without abnormal findings: Secondary | ICD-10-CM

## 2020-09-18 LAB — URINALYSIS, ROUTINE W REFLEX MICROSCOPIC
Bilirubin, UA: NEGATIVE
Glucose, UA: NEGATIVE
Ketones, UA: NEGATIVE
Leukocytes,UA: NEGATIVE
Nitrite, UA: NEGATIVE
RBC, UA: NEGATIVE
Specific Gravity, UA: 1.02 (ref 1.005–1.030)
Urobilinogen, Ur: 1 mg/dL (ref 0.2–1.0)
pH, UA: 7.5 (ref 5.0–7.5)

## 2020-09-18 LAB — MICROSCOPIC EXAMINATION
Bacteria, UA: NONE SEEN
RBC, Urine: NONE SEEN /hpf (ref 0–2)
WBC, UA: NONE SEEN /hpf (ref 0–5)

## 2020-09-18 MED ORDER — NYSTATIN 100000 UNIT/GM EX POWD: 1.0000 "application " | Freq: Three times a day (TID) | CUTANEOUS | 3 refills | Status: DC

## 2020-09-18 MED ORDER — FAMOTIDINE 20 MG PO TABS: 20.0000 mg | ORAL_TABLET | Freq: Every day | ORAL | 3 refills | Status: DC

## 2020-09-18 MED ORDER — PANTOPRAZOLE SODIUM 40 MG PO TBEC: 40.0000 mg | DELAYED_RELEASE_TABLET | Freq: Every day | ORAL | 3 refills | Status: DC

## 2020-09-18 MED ORDER — CITALOPRAM HYDROBROMIDE 40 MG PO TABS: ORAL_TABLET | ORAL | 1 refills | Status: DC

## 2020-09-18 NOTE — Assessment & Plan Note (Signed)
Under good control on current regimen. Continue current regimen. Continue to monitor. Call with any concerns. Refills given. Labs drawn today.   

## 2020-09-18 NOTE — Assessment & Plan Note (Signed)
Interested in starting medication- will check on coverage and review information and let us know which she'd like to start. Follow up 4-6 weeks after starting meds.

## 2020-09-18 NOTE — Patient Instructions (Addendum)
Health Maintenance, Female Adopting a healthy lifestyle and getting preventive care are important in promoting health and wellness. Ask your health care provider about:  The right schedule for you to have regular tests and exams.  Things you can do on your own to prevent diseases and keep yourself healthy. What should I know about diet, weight, and exercise? Eat a healthy diet  Eat a diet that includes plenty of vegetables, fruits, low-fat dairy products, and lean protein.  Do not eat a lot of foods that are high in solid fats, added sugars, or sodium.   Maintain a healthy weight Body mass index (BMI) is used to identify weight problems. It estimates body fat based on height and weight. Your health care provider can help determine your BMI and help you achieve or maintain a healthy weight. Get regular exercise Get regular exercise. This is one of the most important things you can do for your health. Most adults should:  Exercise for at least 150 minutes each week. The exercise should increase your heart rate and make you sweat (moderate-intensity exercise).  Do strengthening exercises at least twice a week. This is in addition to the moderate-intensity exercise.  Spend less time sitting. Even light physical activity can be beneficial. Watch cholesterol and blood lipids Have your blood tested for lipids and cholesterol at 44 years of age, then have this test every 5 years. Have your cholesterol levels checked more often if:  Your lipid or cholesterol levels are high.  You are older than 44 years of age.  You are at high risk for heart disease. What should I know about cancer screening? Depending on your health history and family history, you may need to have cancer screening at various ages. This may include screening for:  Breast cancer.  Cervical cancer.  Colorectal cancer.  Skin cancer.  Lung cancer. What should I know about heart disease, diabetes, and high blood  pressure? Blood pressure and heart disease  High blood pressure causes heart disease and increases the risk of stroke. This is more likely to develop in people who have high blood pressure readings, are of African descent, or are overweight.  Have your blood pressure checked: ? Every 3-5 years if you are 18-39 years of age. ? Every year if you are 40 years old or older. Diabetes Have regular diabetes screenings. This checks your fasting blood sugar level. Have the screening done:  Once every three years after age 40 if you are at a normal weight and have a low risk for diabetes.  More often and at a younger age if you are overweight or have a high risk for diabetes. What should I know about preventing infection? Hepatitis B If you have a higher risk for hepatitis B, you should be screened for this virus. Talk with your health care provider to find out if you are at risk for hepatitis B infection. Hepatitis C Testing is recommended for:  Everyone born from 1945 through 1965.  Anyone with known risk factors for hepatitis C. Sexually transmitted infections (STIs)  Get screened for STIs, including gonorrhea and chlamydia, if: ? You are sexually active and are younger than 44 years of age. ? You are older than 44 years of age and your health care provider tells you that you are at risk for this type of infection. ? Your sexual activity has changed since you were last screened, and you are at increased risk for chlamydia or gonorrhea. Ask your health care provider   if you are at risk.  Ask your health care provider about whether you are at high risk for HIV. Your health care provider may recommend a prescription medicine to help prevent HIV infection. If you choose to take medicine to prevent HIV, you should first get tested for HIV. You should then be tested every 3 months for as long as you are taking the medicine. Pregnancy  If you are about to stop having your period (premenopausal) and  you may become pregnant, seek counseling before you get pregnant.  Take 400 to 800 micrograms (mcg) of folic acid every day if you become pregnant.  Ask for birth control (contraception) if you want to prevent pregnancy. Osteoporosis and menopause Osteoporosis is a disease in which the bones lose minerals and strength with aging. This can result in bone fractures. If you are 64 years old or older, or if you are at risk for osteoporosis and fractures, ask your health care provider if you should:  Be screened for bone loss.  Take a calcium or vitamin D supplement to lower your risk of fractures.  Be given hormone replacement therapy (HRT) to treat symptoms of menopause. Follow these instructions at home: Lifestyle  Do not use any products that contain nicotine or tobacco, such as cigarettes, e-cigarettes, and chewing tobacco. If you need help quitting, ask your health care provider.  Do not use street drugs.  Do not share needles.  Ask your health care provider for help if you need support or information about quitting drugs. Alcohol use  Do not drink alcohol if: ? Your health care provider tells you not to drink. ? You are pregnant, may be pregnant, or are planning to become pregnant.  If you drink alcohol: ? Limit how much you use to 0-1 drink a day. ? Limit intake if you are breastfeeding.  Be aware of how much alcohol is in your drink. In the [place] one drink equals one 12 oz bottle of beer (355 mL), one 5 oz glass of wine (148 mL), or one 1 oz glass of hard liquor (44 mL). General instructions  Schedule regular health, dental, and eye exams.  Stay current with your vaccines.  Tell your health care provider if: ? You often feel depressed. ? You have ever been abused or do not feel safe at home. Summary  Adopting a healthy lifestyle and getting preventive care are important in promoting health and wellness.  Follow your health care provider's instructions about healthy  diet, exercising, and getting tested or screened for diseases.  Follow your health care provider's instructions on monitoring your cholesterol and blood pressure. This information is not intended to replace advice given to you by your health care provider. Make sure you discuss any questions you have with your health care provider. Document Revised: 06/10/2018 Document Reviewed: 06/10/2018 Elsevier Patient Education  2021 [employer]. Bupropion; Naltrexone extended-release tablets What is this medicine? BUPROPION; NALTREXONE (byoo PROE pee on; nal TREX one) is a combination of two drugs that help you lose weight. This product is used with a reduced calorie diet and exercise. This product can also help you maintain weight loss. This medicine may be used for other purposes; ask your health care provider or pharmacist if you have questions. COMMON BRAND NAME(S): Contrave What should I tell my health care provider before I take this medicine? They need to know if you have any of these conditions:  an eating disorder, such as anorexia or bulimia  diabetes  depression  glaucoma  head injury  heart disease  high blood pressure  history of drug abuse or alcohol abuse problem  history of a tumor or infection of your brain or spine  history of heart attack or stroke  history of irregular heartbeat  if you often drink alcohol  kidney disease  liver disease  low levels of sodium in the blood  mental illness  seizures  suicidal thoughts, plans, or attempt; a previous suicide attempt by you or a family member  taken an MAOI like Carbex, Eldepryl, Marplan, Nardil, or Parnate in last 14 days  an unusual or allergic reaction to bupropion, naltrexone, other medicines, foods, dyes, or preservatives  breast-feeding  pregnant or trying to become pregnant How should I use this medicine? Take this medicine by mouth with a glass of water. Follow the directions on the prescription  label. Do not cut, crush or chew this medicine. Swallow the tablets whole. You can take it with or without food. Do not take with high-fat meals as this may increase your risk of seizures. Take your medicine at regular intervals. Do not take it more often than directed. Do not stop taking except on your doctor's advice. A special MedGuide will be given to you by the pharmacist with each prescription and refill. Be sure to read this information carefully each time. Talk to your pediatrician regarding the use of this medicine in children. Special care may be needed. Overdosage: If you think you have taken too much of this medicine contact a poison control center or emergency room at once. NOTE: This medicine is only for you. Do not share this medicine with others. What if I miss a dose? If you miss a dose, skip it. Take your next dose at the normal time. Do not take extra or 2 doses at the same time to make up for the missed dose. What may interact with this medicine? Do not take this medicine with any of the following medications:  any medicines used to stop taking opioids such as methadone or buprenorphine  linezolid  MAOIs like Carbex, Eldepryl, Marplan, Nardil, and Parnate  methylene blue (injected into a vein)  often take narcotic medicines for pain or cough  other medicines that contain bupropion like Zyban or Wellbutrin This medicine may also interact with the following medications:  alcohol  certain medicines for blood pressure like metoprolol, propranolol  certain medicines for depression, anxiety, or psychotic disturbances  certain medicines for HIV or hepatitis  certain medicines for irregular heart beat like propafenone, flecainide  certain medicines for Parkinson's disease like amantadine, levodopa  certain medicines for seizures like carbamazepine, phenytoin, phenobarbital  certain medicines for  sleep  cimetidine  clopidogrel  cyclophosphamide  digoxin  disulfiram  furazolidone  isoniazid  nicotine  orphenadrine  procarbazine  steroid medicines like prednisone or cortisone  stimulant medicines for attention disorders, weight loss, or to stay awake  tamoxifen  theophylline  thiotepa  ticlopidine  tramadol  warfarin This list may not describe all possible interactions. Give your health care provider a list of all the medicines, herbs, non-prescription drugs, or dietary supplements you use. Also tell them if you smoke, drink alcohol, or use illegal drugs. Some items may interact with your medicine. What should I watch for while using this medicine? Visit your doctor or healthcare provider for regular checks on your progress. This medicine may cause serious skin reactions. They can happen weeks to months after starting the medicine. Contact your healthcare provider right away  if you notice fevers or flu-like symptoms with a rash. The rash may be red or purple and then turn into blisters or peeling of the skin. Or, you might notice a red rash with swelling of the face, lips or lymph nodes in your neck or under your arms. This medicine may affect blood sugar. Ask your healthcare provider if changes in diet or medicines are needed if you have diabetes. Patients and their families should watch out for new or worsening depression or thoughts of suicide. Also watch out for sudden changes in feelings such as feeling anxious, agitated, panicky, irritable, hostile, aggressive, impulsive, severely restless, overly excited and hyperactive, or not being able to sleep. If this happens, especially at the beginning of treatment or after a change in dose, call your healthcare provider. Avoid alcoholic drinks while taking this medicine. Drinking large amounts of alcoholic beverages, using sleeping or anxiety medicines, or quickly stopping the use of these agents while taking this  medicine may increase your risk for a seizure. Do not drive or use heavy machinery until you know how this medicine affects you. This medicine can impair your ability to perform these tasks. Women should inform their health care provider if they wish to become pregnant or think they might be pregnant. Losing weight while pregnant is not advised and may cause harm to the unborn child. Talk to your health care provider for more information. What side effects may I notice from receiving this medicine? Side effects that you should report to your doctor or health care professional as soon as possible:  allergic reactions like skin rash, itching or hives, swelling of the face, lips, or tongue  breathing problems  changes in vision  confusion  elevated mood, decreased need for sleep, racing thoughts, impulsive behavior  fast or irregular heartbeat  hallucinations, loss of contact with reality  increased blood pressure  rash, fever, and swollen lymph nodes  redness, blistering, peeling, or loosening of the skin, including inside the mouth  seizures  signs and symptoms of liver injury like dark yellow or brown urine; general ill feeling or flu-like symptoms; light-colored stools; loss of appetite; nausea; right upper belly pain; unusually weak or tired; yellowing of the eyes or skin  suicidal thoughts or other mood changes  vomiting Side effects that usually do not require medical attention (report to your doctor or health care professional if they continue or are bothersome):  constipation  headache  loss of appetite  indigestion, stomach upset  tremors This list may not describe all possible side effects. Call your doctor for medical advice about side effects. You may report side effects to FDA at 1-800-FDA-1088. Where should I keep my medicine? Keep out of the reach of children. Store at room temperature between 15 and 30 degrees C (59 and 86 degrees F). Throw away any unused  medicine after the expiration date. NOTE: This sheet is a summary. It may not cover all possible information. If you have questions about this medicine, talk to your doctor, pharmacist, or health care provider.  2021 Elsevier/Gold Standard (2019-04-23 16:10:96) Liraglutide injection (Weight Management) What is this medicine? LIRAGLUTIDE (LIR a GLOO tide) is used to help people lose weight and maintain weight loss. It is used with a reduced-calorie diet and exercise. This medicine may be used for other purposes; ask your health care provider or pharmacist if you have questions. COMMON BRAND NAME(S): Saxenda What should I tell my health care provider before I take this medicine? They need  to know if you have any of these conditions:  endocrine tumors (MEN 2) or if someone in your family had these tumors  gallbladder disease  high cholesterol  history of alcohol abuse problem  history of pancreatitis  kidney disease or if you are on dialysis  liver disease  previous swelling of the tongue, face, or lips with difficulty breathing, difficulty swallowing, hoarseness, or tightening of the throat  stomach problems  suicidal thoughts, plans, or attempt; a previous suicide attempt by you or a family member  thyroid cancer or if someone in your family had thyroid cancer  an unusual or allergic reaction to liraglutide, other medicines, foods, dyes, or preservatives  pregnant or trying to get pregnant  breast-feeding How should I use this medicine? This medicine is for injection under the skin of your upper leg, stomach area, or upper arm. You will be taught how to prepare and give this medicine. Use exactly as directed. Take your medicine at regular intervals. Do not take it more often than directed. This medicine comes with INSTRUCTIONS FOR USE. Ask your pharmacist for directions on how to use this medicine. Read the information carefully. Talk to your pharmacist or health care provider  if you have questions. It is important that you put your used needles and syringes in a special sharps container. Do not put them in a trash can. If you do not have a sharps container, call your pharmacist or healthcare provider to get one. A special MedGuide will be given to you by the pharmacist with each prescription and refill. Be sure to read this information carefully each time. Talk to your health care provider about the use of this medicine in children. While it may be prescribed for children as young as 37 years of age for selected conditions, precautions do apply. Overdosage: If you think you have taken too much of this medicine contact a poison control center or emergency room at once. NOTE: This medicine is only for you. Do not share this medicine with others. What if I miss a dose? If you miss a dose, take it as soon as you can. If it is almost time for your next dose, take only that dose. Do not take double or extra doses. If you miss your dose for 3 days or more, call your doctor or health care professional to talk about how to restart this medicine. What may interact with this medicine?  insulin and other medicines for diabetes This list may not describe all possible interactions. Give your health care provider a list of all the medicines, herbs, non-prescription drugs, or dietary supplements you use. Also tell them if you smoke, drink alcohol, or use illegal drugs. Some items may interact with your medicine. What should I watch for while using this medicine? Visit your doctor or health care professional for regular checks on your progress. Drink plenty of fluids while taking this medicine. Check with your doctor or health care professional if you get an attack of severe diarrhea, nausea, and vomiting. The loss of too much body fluid can make it dangerous for you to take this medicine. This medicine may affect blood sugar levels. Ask your healthcare provider if changes in diet or  medicines are needed if you have diabetes. Patients and their families should watch out for worsening depression or thoughts of suicide. Also watch out for sudden changes in feelings such as feeling anxious, agitated, panicky, irritable, hostile, aggressive, impulsive, severely restless, overly excited and hyperactive, or not  being able to sleep. If this happens, especially at the beginning of treatment or after a change in dose, call your health care professional. Women should inform their health care provider if they wish to become pregnant or think they might be pregnant. Losing weight while pregnant is not advised and may cause harm to the unborn child. Talk to your health care provider for more information. What side effects may I notice from receiving this medicine? Side effects that you should report to your doctor or health care professional as soon as possible:  allergic reactions like skin rash, itching or hives, swelling of the face, lips, or tongue  breathing problems  diarrhea that continues or is severe  lump or swelling on the neck  severe nausea  signs and symptoms of infection like fever or chills; cough; sore throat; pain or trouble passing urine  signs and symptoms of low blood sugar such as feeling anxious; confusion; dizziness; increased hunger; unusually weak or tired; increased sweating; shakiness; cold, clammy skin; irritable; headache; blurred vision; fast heartbeat; loss of consciousness  signs and symptoms of kidney injury like trouble passing urine or change in the amount of urine  trouble swallowing  unusual stomach upset or pain  vomiting Side effects that usually do not require medical attention (report to your doctor or health care professional if they continue or are bothersome):  constipation  decreased appetite  diarrhea  fatigue  headache  nausea  pain, redness, or irritation at site where injected  stomach upset  stuffy or runny  nose This list may not describe all possible side effects. Call your doctor for medical advice about side effects. You may report side effects to FDA at 1-800-FDA-1088. Where should I keep my medicine? Keep out of the reach of children. Store unopened pen in a refrigerator between 2 and 8 degrees C (36 and 46 degrees F). Do not freeze or use if the medicine has been frozen. Protect from light and excessive heat. After you first use the pen, it can be stored at room temperature between 15 and 30 degrees C (59 and 86 degrees F) or in a refrigerator. Throw away your used pen after 30 days or after the expiration date, whichever comes first. Do not store your pen with the needle attached. If the needle is left on, medicine may leak from the pen. NOTE: This sheet is a summary. It may not cover all possible information. If you have questions about this medicine, talk to your doctor, pharmacist, or health care provider.  2021 Elsevier/Gold Standard (2019-06-10 13:56:25)  Tailbone Injury  The tailbone (coccyx) is the small bone at the lower end of the spine. A tailbone injury may involve stretched ligaments, bruising, or a broken bone (fracture). Tailbone injuries can be painful, and some may take a long time to heal. What are the causes? This condition may be caused by:  Falling and landing on the tailbone.  Repeated strain or friction from sitting for long periods of time. This may include actions such as rowing and bicycling.  Childbirth. In some cases, the cause may not be known. What are the signs or symptoms? Symptoms of this condition include:  Pain in the tailbone area or lower back, especially when sitting.  Pain or difficulty when standing up from a sitting position.  Bruising or swelling in the tailbone area.  Painful bowel movements.  In women, pain during intercourse. How is this diagnosed? This condition may be diagnosed based on:  Your symptoms.  A physical exam. If  your health care provider suspects a fracture, you may have additional tests, such as:  X-rays.  CT scan.  MRI. How is this treated? Most tailbone injuries heal on their own in 4-6 weeks. However, recovery time may be longer if the injury involves a fracture. Treatment for this condition may include:  NSAIDs or other over-the-counter medicines to help relieve your pain.  Using a large, rubber or inflated ring or cushion to take pressure off the tailbone when sitting.  Physical therapy.  Injecting the tailbone area with local anesthesia and steroid medicine. This is not normally needed unless the pain does not improve over time with over-the-counter pain medicines. Follow these instructions at home: Activity  Avoid sitting for long periods of time.  To prevent repeating an injury that is caused by strain or friction: ? Wear appropriate padding and sports gear when bicycling and rowing.  Increase your activity as the pain allows. Perform any exercises that are recommended by your health care provider or physical therapist. Managing pain, stiffness, and swelling  To help decrease discomfort when sitting: ? Sit on your rubber or inflated ring or cushion as told by your health care provider. ? Lean forward when you sit.  If directed, apply ice to the injured area: ? Put ice in a plastic bag. ? Place a towel between your skin and the bag. ? Leave the ice on for 20 minutes, 2-3 times per day for the first 1-2 days.  If directed, apply heat to the affected area as often as told by your health care provider. Use the heat source that your health care provider recommends, such as a moist heat pack or a heating pad. ? Place a towel between your skin and the heat source. ? Leave the heat on for 20-30 minutes. ? Remove the heat if your skin turns bright red. This is especially important if you are unable to feel pain, heat, or cold. You may have a greater risk of getting burned. General  instructions  Take over-the-counter and prescription medicines only as told by your health care provider.  To prevent or treat constipation or painful bowel movements, your health care provider may recommend that you: ? Drink enough fluid to keep your urine pale yellow. ? Eat foods that are high in fiber, such as fresh fruits and vegetables, whole grains, and beans. ? Limit foods that are high in fat and processed sugars, such as fried and sweet foods. ? Take an over-the-counter or prescription medicine for constipation.  Keep all follow-up visits as directed by your health care provider. This is important. Contact a health care provider if:  Your pain becomes worse or is not controlled with medicine.  Your bowel movements cause a great deal of discomfort.  You are unable to have a bowel movement after 4 days.  You have pain during intercourse. Summary  A tailbone injury may involve stretched ligaments, bruising, or a broken bone (fracture).  Tailbone injuries can be painful. Most heal on their own in 4-6 weeks.  Treatment may include taking NSAIDs, using a rubber or inflated ring or cushion when sitting, and physical therapy.  Follow any recommendations from your health care provider to prevent or treat constipation. This information is not intended to replace advice given to you by your health care provider. Make sure you discuss any questions you have with your health care provider. Document Revised: 07/15/2017 Document Reviewed: 07/15/2017 Elsevier Patient Education  2021 [employer]

## 2020-09-18 NOTE — Progress Notes (Signed)
BP 102/71   Pulse 78   Temp 98.6 F (37 C) (Oral)   Ht 5' 6.8" (1.697 m)   Wt 295 lb (133.8 kg)   SpO2 98%   BMI 46.48 kg/m    Subjective:    Patient ID: Kimberly Santiago, female    DOB: Aug 07, 1975, 45 y.o.   MRN: [id number]  HPI: Kimberly Santiago is a 45 y.o. female presenting on 09/18/2020 for comprehensive medical examination. Current medical complaints include:  WEIGHT GAIN Duration: chronic Previous attempts at weight loss: yes- bariatric surgery Complications of obesity:  Peak weight: 389lbs Weight loss goal: 220lbs Weight loss to date: 94lbs Requesting obesity pharmacotherapy: yes Current weight loss supplements/medications: no Previous weight loss supplements/meds: no  Has continued with   Has been having pain in her coccyx for months, has tried different chairs with no changes.   Has   ANXIETY/STRESS Duration: Chronic Status:controlled Anxious mood: yes  Excessive worrying: no Irritability: no  Sweating: no Nausea: no Palpitations:no Hyperventilation: no Panic attacks: no Agoraphobia: no  Obscessions/compulsions: no Depressed mood: no Depression screen [facility] 2/9 09/18/2020 09/16/2019 08/23/2019  Decreased Interest 0 0 0  Down, Depressed, Hopeless 0 0 0  PHQ - 2 Score 0 0 0  Altered sleeping 0 0 1  Tired, decreased energy 3 0 1  Change in appetite 1 0 0  Feeling bad or failure about yourself  0 0 0  Trouble concentrating 0 0 1  Moving slowly or fidgety/restless 0 0 0  Suicidal thoughts 0 0 0  PHQ-9 Score 4 0 3  Difficult doing work/chores Not difficult at all - -   Anhedonia: no Weight changes: no Insomnia: no   Hypersomnia: no Fatigue/loss of energy: no Feelings of worthlessness: no Feelings of guilt: no Impaired concentration/indecisiveness: no Suicidal ideations: no  Crying spells: no Recent Stressors/Life Changes: no   Relationship problems: no   Family stress: no     Financial stress: no    Job stress: no    Recent death/loss:  no  She currently lives with: husband and kids Menopausal Symptoms: no  Depression Screen done today and results listed below:  Depression screen [facility] 2/9 09/18/2020 09/16/2019 08/23/2019  Decreased Interest 0 0 0  Down, Depressed, Hopeless 0 0 0  PHQ - 2 Score 0 0 0  Altered sleeping 0 0 1  Tired, decreased energy 3 0 1  Change in appetite 1 0 0  Feeling bad or failure about yourself  0 0 0  Trouble concentrating 0 0 1  Moving slowly or fidgety/restless 0 0 0  Suicidal thoughts 0 0 0  PHQ-9 Score 4 0 3  Difficult doing work/chores Not difficult at all - -    Past Medical History:  Past Medical History:  Diagnosis Date  . Anemia   . Asthma   . Barrett esophagus   . Complication of anesthesia    Pt stopped breathing during EGD but did well with recent Gastric Bypass Surgery  . Depression   . GERD (gastroesophageal reflux disease)    H/O  . Headache    H/O MIGRAINES  . History of hiatal hernia    SMALL  . PCOS (polycystic ovarian syndrome)   . PONV (postoperative nausea and vomiting)   . Sleep apnea    (mild) PT IS NOT USING CPAP  . Status post repeat low transverse cesarean section 07/27/2019    Surgical History:  Past Surgical History:  Procedure Laterality Date  . CESAREAN SECTION N/A  08/18/2017   Procedure: Primary CESAREAN SECTION;  Surgeon: Noland Fordyce, MD;  Location: [facility];  Service: Obstetrics;  Laterality: N/A;  EDD: 09/13/17 Allergy: Amoxicillin, Morphine, Hydrocodone  . CESAREAN SECTION WITH BILATERAL TUBAL LIGATION Bilateral 07/27/2019   Procedure: Repeat CESAREAN SECTION WITH BILATERAL TUBAL LIGATION;  Surgeon: Noland Fordyce, MD;  Location: [facility];  Service: Obstetrics;  Laterality: Bilateral;  EDD: 07/31/19  . CHOLECYSTECTOMY    . COLONOSCOPY WITH PROPOFOL N/A 07/07/2020   Procedure: COLONOSCOPY WITH PROPOFOL;  Surgeon: Midge Minium, MD;  Location: [facility];  Service: Endoscopy;  Laterality: N/A;  . DIAGNOSTIC LAPAROSCOPY    .  EAR CYST EXCISION N/A 11/24/2014   Procedure: CYST REMOVAL;  Surgeon: Bud Face, MD;  Location: [facility] OR[facility];  Service: ENT;  Laterality: N/A;  upper lip  . ESOPHAGOGASTRODUODENOSCOPY    . ESOPHAGOGASTRODUODENOSCOPY (EGD) WITH PROPOFOL N/A 07/07/2020   Procedure: ESOPHAGOGASTRODUODENOSCOPY (EGD) WITH PROPOFOL;  Surgeon: Midge Minium, MD;  Location: [facility];  Service: Endoscopy;  Laterality: N/A;  sleep apnea  . ETHMOIDECTOMY Right 05/07/2018   Procedure: ETHMOIDECTOMY;  Surgeon: Bud Face, MD;  Location: [facility] OR[facility];  Service: ENT;  Laterality: Right;  . FRONTAL SINUS EXPLORATION Right 05/07/2018   Procedure: FRONTAL SINUS EXPLORATION;  Surgeon: Bud Face, MD;  Location: [facility] OR[facility];  Service: ENT;  Laterality: Right;  . GASTRIC BYPASS  March 2016  . IMAGE GUIDED SINUS SURGERY N/A 05/07/2018   Procedure: IMAGE GUIDED SINUS SURGERY;  Surgeon: Bud Face, MD;  Location: [facility] ORS;  Service: ENT;  Laterality: N/A;  . KNEE ARTHROSCOPY Right   . MAXILLARY ANTROSTOMY Bilateral 05/07/2018   Procedure: MAXILLARY ANTROSTOMY;  Surgeon: Bud Face, MD;  Location: [facility] OR[facility];  Service: ENT;  Laterality: Bilateral;  . NASAL SEPTOPLASTY W/ TURBINOPLASTY Bilateral 05/07/2018   Procedure: NASAL SEPTOPLASTY WITH TURBINATE REDUCTION;  Surgeon: Bud Face, MD;  Location: [facility];  Service: ENT;  Laterality: Bilateral;  . SPHENOIDECTOMY Right 05/07/2018   Procedure: SPHENOIDECTOMY;  Surgeon: Bud Face, MD;  Location: [facility] OR[facility];  Service: ENT;  Laterality: Right;  . TEMPOROMANDIBULAR JOINT SURGERY    . WISDOM TOOTH EXTRACTION      Medications:  Current Outpatient Medications on File Prior to Visit  Medication Sig  . acetaminophen (TYLENOL) 325 MG tablet Take 650 mg by mouth every 6 (six) hours as needed for mild pain or headache.  . busPIRone (BUSPAR) 7.5 MG tablet Take 7.5 mg by mouth at bedtime.  Marland Kitchen loratadine (CLARITIN) 10 MG tablet Take 10 mg by mouth daily as  needed for allergies.  . [DISCONTINUED] fluticasone (FLONASE) 50 MCG/ACT nasal spray Place 2 sprays into both nostrils at bedtime.   No current facility-administered medications on file prior to visit.    Allergies:  Allergies  Allergen Reactions  . Hydromorphone Shortness Of Breath, Nausea And Vomiting and Other (See Comments)    Chest pain  . Hydrocodone Nausea And Vomiting and Other (See Comments)    Headache, Patient can take oxycodone.  . Morphine And Related Nausea And Vomiting and Other (See Comments)    Headache  . Nsaids Other (See Comments)    Pt cannot take due to Gastric Bypass Surgery  . Amoxicillin Itching    Can take cephalexin. Did it involve swelling of the face/tongue/throat, SOB, or low BP? No Did it involve sudden or severe rash/hives, skin peeling, or any reaction on the inside of your mouth or nose? No Did you need to seek medical attention at a hospital or  doctor's office? No When did it last happen?Several years If all above answers are "NO", may proceed with cephalosporin use.     Social History:  Social History   Socioeconomic History  . Marital status: Married    Spouse name: Not on file  . Number of children: Not on file  . Years of education: Not on file  . Highest education level: Not on file  Occupational History  . Not on file  Tobacco Use  . Smoking status: Never Smoker  . Smokeless tobacco: Never Used  Vaping Use  . Vaping Use: Never used  Substance and Sexual Activity  . Alcohol use: Not Currently  . Drug use: No  . Sexual activity: Yes  Other Topics Concern  . Not on file  Social History Narrative  . Not on file   Social Determinants of Health   Financial Resource Strain: Not on file  Food Insecurity: Not on file  Transportation Needs: Not on file  Physical Activity: Not on file  Stress: Not on file  Social Connections: Not on file  Intimate Partner Violence: Not on file   Social History   Tobacco Use  Smoking  Status Never Smoker  Smokeless Tobacco Never Used   Social History   Substance and Sexual Activity  Alcohol Use Not Currently    Family History:  Family History  Problem Relation Age of Onset  . Breast cancer Paternal Aunt        pat great aunt  . Heart disease Maternal Grandmother   . Heart disease Maternal Grandfather   . Heart disease Paternal Grandmother   . Heart disease Paternal Grandfather   . Anxiety disorder Mother   . Depression Mother   . Miscarriages / [place] Sister     Past medical history, surgical history, medications, allergies, family history and social history reviewed with patient today and changes made to appropriate areas of the chart.   Review of Systems  Constitutional: Negative.   HENT: Negative.   Eyes: Positive for blurred vision. Negative for double vision, photophobia, pain, discharge and redness.  Respiratory: Negative.   Cardiovascular: Negative.   Gastrointestinal: Negative.   Genitourinary: Negative.   Musculoskeletal: Negative.   Skin: Positive for rash. Negative for itching.  Neurological: Negative.   Endo/Heme/Allergies: Negative.   Psychiatric/Behavioral: Negative.    All other ROS negative except what is listed above and in the HPI.      Objective:    BP 102/71   Pulse 78   Temp 98.6 F (37 C) (Oral)   Ht 5' 6.8" (1.697 m)   Wt 295 lb (133.8 kg)   SpO2 98%   BMI 46.48 kg/m   Wt Readings from Last 3 Encounters:  09/18/20 295 lb (133.8 kg)  07/07/20 293 lb (132.9 kg)  06/28/20 294 lb 3.2 oz (133.4 kg)    Physical Exam Vitals and nursing note reviewed.  Constitutional:      General: She is not in acute distress.    Appearance: Normal appearance. She is not ill-appearing, toxic-appearing or diaphoretic.  HENT:     Head: Normocephalic and atraumatic.     Right Ear: Tympanic membrane, ear canal and external ear normal. There is no impacted cerumen.     Left Ear: Tympanic membrane, ear canal and external ear normal.  There is no impacted cerumen.     Nose: Nose normal. No congestion or rhinorrhea.     Mouth/Throat:     Mouth: Mucous membranes are moist.  Pharynx: Oropharynx is clear. No oropharyngeal exudate or posterior oropharyngeal erythema.  Eyes:     General: No scleral icterus.       Right eye: No discharge.        Left eye: No discharge.     Extraocular Movements: Extraocular movements intact.     Conjunctiva/sclera: Conjunctivae normal.     Pupils: Pupils are equal, round, and reactive to light.  Neck:     Vascular: No carotid bruit.  Cardiovascular:     Rate and Rhythm: Normal rate and regular rhythm.     Pulses: Normal pulses.     Heart sounds: No murmur heard. No friction rub. No gallop.   Pulmonary:     Effort: Pulmonary effort is normal. No respiratory distress.     Breath sounds: Normal breath sounds. No stridor. No wheezing, rhonchi or rales.  Chest:     Chest wall: No tenderness.  Abdominal:     General: Abdomen is flat. Bowel sounds are normal. There is no distension.     Palpations: Abdomen is soft. There is no mass.     Tenderness: There is no abdominal tenderness. There is no right CVA tenderness, left CVA tenderness, guarding or rebound.     Hernia: No hernia is present.  Genitourinary:    Comments: Breast and pelvic exams deferred with shared decision making Musculoskeletal:        General: No swelling, tenderness, deformity or signs of injury.     Cervical back: Normal range of motion and neck supple. No rigidity. No muscular tenderness.     Right lower leg: No edema.     Left lower leg: No edema.  Lymphadenopathy:     Cervical: No cervical adenopathy.  Skin:    General: Skin is warm and dry.     Capillary Refill: Capillary refill takes less than 2 seconds.     Coloration: Skin is not jaundiced or pale.     Findings: No bruising, erythema, lesion or rash.  Neurological:     General: No focal deficit present.     Mental Status: She is alert and oriented to  person, place, and time. Mental status is at baseline.     Cranial Nerves: No cranial nerve deficit.     Sensory: No sensory deficit.     Motor: No weakness.     Coordination: Coordination normal.     Gait: Gait normal.     Deep Tendon Reflexes: Reflexes normal.  Psychiatric:        Mood and Affect: Mood normal.        Behavior: Behavior normal.        Thought Content: Thought content normal.        Judgment: Judgment normal.     Results for orders placed or performed in visit on 09/18/20  HM PAP SMEAR  Result Value Ref Range   HM Pap smear Normal       Assessment & Plan:   Problem List Items Addressed This Visit      Digestive   GERD (gastroesophageal reflux disease)    Under good control on current regimen. Continue current regimen. Continue to monitor. Call with any concerns. Refills given. Labs drawn today.        Relevant Medications   pantoprazole (PROTONIX) 40 MG tablet   famotidine (PEPCID) 20 MG tablet     Other   Anxiety    Under good control on current regimen. Continue current regimen. Continue to monitor. Call with any  concerns. Refills given. Labs drawn today.        Relevant Medications   citalopram (CELEXA) 40 MG tablet   Depression    Under good control on current regimen. Continue current regimen. Continue to monitor. Call with any concerns. Refills given. Labs drawn today.        Relevant Medications   citalopram (CELEXA) 40 MG tablet   BMI 45.0-49.9, adult (HCC)    Interested in starting medication- will check on coverage and review information and let us know which she'd like to start. Follow up 4-6 weeks after starting meds.        Other Visit Diagnoses    Routine general medical examination at a health care facility    -  Primary   Vaccines up to date/declined. Screening labs checked today. Pap and mammo up to date. Continue diet and exercise. Call with any concerns.    Relevant Orders   CBC with Differential/Platelet   Comprehensive  metabolic panel   Lipid Panel w/o Chol/HDL Ratio   Urinalysis, Routine w reflex microscopic   TSH   Candidal intertrigo       Treat with nystatin. Call with any concerns or if not getting better or getting worse.    Relevant Medications   nystatin (MYCOSTATIN/NYSTOP) powder   Left hip pain       Will refer to PT. Call if not getting better or gettign worse.    Relevant Orders   Ambulatory referral to Physical Therapy   Eye exam, routine       Will refer to opthalmology. Call with any concerns. Continue to monitor.    Relevant Orders   Ambulatory referral to Ophthalmology       Follow up plan: Return 4-6 weeks after starting weight loss medicine.   LABORATORY TESTING:  - Pap smear: up to date  IMMUNIZATIONS:   - Tdap: Tetanus vaccination status reviewed: last tetanus booster within 10 years. - Influenza: Up to date - Pneumovax: Refused   SCREENING: -Mammogram: Up to date  - Colonoscopy: Up to date   PATIENT COUNSELING:   Advised to take 1 mg of folate supplement per day if capable of pregnancy.   Sexuality: Discussed sexually transmitted diseases, partner selection, use of condoms, avoidance of unintended pregnancy  and contraceptive alternatives.   Advised to avoid cigarette smoking.  I discussed with the patient that most people either abstain from alcohol or drink within safe limits (<=14/week and <=4 drinks/occasion for males, <=7/weeks and <= 3 drinks/occasion for females) and that the risk for alcohol disorders and other health effects rises proportionally with the number of drinks per week and how often a drinker exceeds daily limits.  Discussed cessation/primary prevention of drug use and availability of treatment for abuse.   Diet: Encouraged to adjust caloric intake to maintain  or achieve ideal body weight, to reduce intake of dietary saturated fat and total fat, to limit sodium intake by avoiding high sodium foods and not adding table salt, and to maintain  adequate dietary potassium and calcium preferably from fresh fruits, vegetables, and low-fat dairy products.    stressed the importance of regular exercise  Injury prevention: Discussed safety belts, safety helmets, smoke detector, smoking near bedding or upholstery.   Dental health: Discussed importance of regular tooth brushing, flossing, and dental visits.    NEXT PREVENTATIVE PHYSICAL DUE IN 1 YEAR. Return 4-6 weeks after starting weight loss medicine.

## 2020-09-19 ENCOUNTER — Encounter: Payer: Self-pay | Admitting: Family Medicine

## 2020-09-19 LAB — COMPREHENSIVE METABOLIC PANEL
ALT: 12 IU/L (ref 0–32)
AST: 16 IU/L (ref 0–40)
Albumin/Globulin Ratio: 1.2 (ref 1.2–2.2)
Albumin: 3.6 g/dL — ABNORMAL LOW (ref 3.8–4.8)
Alkaline Phosphatase: 71 IU/L (ref 44–121)
BUN/Creatinine Ratio: 14 (ref 9–23)
BUN: 12 mg/dL (ref 6–24)
Bilirubin Total: 0.3 mg/dL (ref 0.0–1.2)
CO2: 24 mmol/L (ref 20–29)
Calcium: 8.5 mg/dL — ABNORMAL LOW (ref 8.7–10.2)
Chloride: 103 mmol/L (ref 96–106)
Creatinine, Ser: 0.87 mg/dL (ref 0.57–1.00)
Globulin, Total: 3 g/dL (ref 1.5–4.5)
Glucose: 88 mg/dL (ref 65–99)
Potassium: 4.6 mmol/L (ref 3.5–5.2)
Sodium: 139 mmol/L (ref 134–144)
Total Protein: 6.6 g/dL (ref 6.0–8.5)
eGFR: 85 mL/min/{1.73_m2} (ref >59)

## 2020-09-19 LAB — CBC WITH DIFFERENTIAL/PLATELET
Basophils Absolute: 0.1 10*3/uL (ref 0.0–0.2)
Basos: 2 %
EOS (ABSOLUTE): 0.3 10*3/uL (ref 0.0–0.4)
Eos: 5 %
Hematocrit: 40.8 % (ref 34.0–46.6)
Hemoglobin: 13.4 g/dL (ref 11.1–15.9)
Immature Grans (Abs): 0 10*3/uL (ref 0.0–0.1)
Immature Granulocytes: 0 %
Lymphocytes Absolute: 1.9 10*3/uL (ref 0.7–3.1)
Lymphs: 32 %
MCH: 28 pg (ref 26.6–33.0)
MCHC: 32.8 g/dL (ref 31.5–35.7)
MCV: 85 fL (ref 79–97)
Monocytes Absolute: 0.5 10*3/uL (ref 0.1–0.9)
Monocytes: 9 %
Neutrophils Absolute: 3.2 10*3/uL (ref 1.4–7.0)
Neutrophils: 52 %
Platelets: 332 10*3/uL (ref 150–450)
RBC: 4.78 x10E6/uL (ref 3.77–5.28)
RDW: 12.8 % (ref 11.7–15.4)
WBC: 6 10*3/uL (ref 3.4–10.8)

## 2020-09-19 LAB — TSH: TSH: 1.75 u[IU]/mL (ref 0.450–4.500)

## 2020-09-19 LAB — LIPID PANEL W/O CHOL/HDL RATIO
Cholesterol, Total: 179 mg/dL (ref 100–199)
HDL: 61 mg/dL (ref >39)
LDL Chol Calc (NIH): 104 mg/dL — ABNORMAL HIGH (ref 0–99)
Triglycerides: 76 mg/dL (ref 0–149)
VLDL Cholesterol Cal: 14 mg/dL (ref 5–40)

## 2020-10-27 DIAGNOSIS — E1159 Type 2 diabetes mellitus with other circulatory complications: Secondary | ICD-10-CM | POA: Diagnosis not present

## 2020-10-27 DIAGNOSIS — E1169 Type 2 diabetes mellitus with other specified complication: Secondary | ICD-10-CM | POA: Diagnosis not present

## 2020-10-27 DIAGNOSIS — I152 Hypertension secondary to endocrine disorders: Secondary | ICD-10-CM | POA: Diagnosis not present

## 2020-11-13 DIAGNOSIS — J301 Allergic rhinitis due to pollen: Secondary | ICD-10-CM | POA: Diagnosis not present

## 2020-12-08 DIAGNOSIS — J301 Allergic rhinitis due to pollen: Secondary | ICD-10-CM | POA: Diagnosis not present

## 2020-12-20 ENCOUNTER — Emergency Department
Admission: EM | Admit: 2020-12-20 | Discharge: 2020-12-20 | Disposition: A | Discharge status: Home / Self Care | Payer: BC Managed Care – PPO | Attending: Emergency Medicine | Admitting: Emergency Medicine

## 2020-12-20 ENCOUNTER — Ambulatory Visit: Payer: Self-pay | Admitting: *Deleted

## 2020-12-20 ENCOUNTER — Encounter: Payer: Self-pay | Admitting: Emergency Medicine

## 2020-12-20 ENCOUNTER — Other Ambulatory Visit: Payer: Self-pay

## 2020-12-20 ENCOUNTER — Ambulatory Visit
Admission: RE | Admit: 2020-12-20 | Discharge: 2020-12-20 | Disposition: A | Discharge status: Other Acute Inpatient Hospital | Payer: BC Managed Care – PPO | Source: Ambulatory Visit

## 2020-12-20 VITALS — BP 110/75 | HR 90 | Temp 98.8°F | Resp 18

## 2020-12-20 DIAGNOSIS — M546 Pain in thoracic spine: Secondary | ICD-10-CM | POA: Diagnosis not present

## 2020-12-20 DIAGNOSIS — E162 Hypoglycemia, unspecified: Secondary | ICD-10-CM

## 2020-12-20 DIAGNOSIS — R81 Glycosuria: Secondary | ICD-10-CM | POA: Diagnosis not present

## 2020-12-20 DIAGNOSIS — M545 Low back pain, unspecified: Secondary | ICD-10-CM | POA: Insufficient documentation

## 2020-12-20 DIAGNOSIS — J4520 Mild intermittent asthma, uncomplicated: Secondary | ICD-10-CM | POA: Diagnosis not present

## 2020-12-20 DIAGNOSIS — M5459 Other low back pain: Secondary | ICD-10-CM | POA: Diagnosis not present

## 2020-12-20 DIAGNOSIS — Z79899 Other long term (current) drug therapy: Secondary | ICD-10-CM | POA: Insufficient documentation

## 2020-12-20 LAB — CBG MONITORING, ED: Glucose-Capillary: 76 mg/dL (ref 70–99)

## 2020-12-20 LAB — CBC
HCT: 40.5 % (ref 36.0–46.0)
Hemoglobin: 13.2 g/dL (ref 12.0–15.0)
MCH: 27.5 pg (ref 26.0–34.0)
MCHC: 32.6 g/dL (ref 30.0–36.0)
MCV: 84.4 fL (ref 80.0–100.0)
Platelets: 343 10*3/uL (ref 150–400)
RBC: 4.8 MIL/uL (ref 3.87–5.11)
RDW: 12.8 % (ref 11.5–15.5)
WBC: 7.8 10*3/uL (ref 4.0–10.5)
nRBC: 0 % (ref 0.0–0.2)

## 2020-12-20 LAB — BASIC METABOLIC PANEL
Anion gap: 3 — ABNORMAL LOW (ref 5–15)
BUN: 14 mg/dL (ref 6–20)
CO2: 28 mmol/L (ref 22–32)
Calcium: 8.8 mg/dL — ABNORMAL LOW (ref 8.9–10.3)
Chloride: 107 mmol/L (ref 98–111)
Creatinine, Ser: 0.99 mg/dL (ref 0.44–1.00)
GFR, Estimated: >60 mL/min (ref >60)
Glucose, Bld: 78 mg/dL (ref 70–99)
Potassium: 4.2 mmol/L (ref 3.5–5.1)
Sodium: 138 mmol/L (ref 135–145)

## 2020-12-20 LAB — POCT URINALYSIS DIP (MANUAL ENTRY)
Bilirubin, UA: NEGATIVE
Blood, UA: NEGATIVE
Glucose, UA: >=1000 mg/dL — AB
Leukocytes, UA: NEGATIVE
Nitrite, UA: NEGATIVE
Protein Ur, POC: NEGATIVE mg/dL
Spec Grav, UA: >=1.03 — AB (ref 1.010–1.025)
Urobilinogen, UA: 0.2 E.U./dL
pH, UA: 5.5 (ref 5.0–8.0)

## 2020-12-20 LAB — URINALYSIS, COMPLETE (UACMP) WITH MICROSCOPIC
Bilirubin Urine: NEGATIVE
Glucose, UA: NEGATIVE mg/dL
Hgb urine dipstick: NEGATIVE
Ketones, ur: 5 mg/dL — AB
Leukocytes,Ua: NEGATIVE
Nitrite: NEGATIVE
Protein, ur: NEGATIVE mg/dL
Specific Gravity, Urine: 1.034 — ABNORMAL HIGH (ref 1.005–1.030)
pH: 5 (ref 5.0–8.0)

## 2020-12-20 LAB — POCT FASTING CBG KUC MANUAL ENTRY
POCT Glucose (KUC): 54 mg/dL — AB (ref 70–99)
POCT Glucose (KUC): 81 mg/dL (ref 70–99)

## 2020-12-20 LAB — POC URINE PREG, ED: Preg Test, Ur: NEGATIVE

## 2020-12-20 NOTE — ED Provider Notes (Addendum)
[facility]    CSN: [id number] Arrival date & time: 12/20/20  1517      History   Chief Complaint Chief Complaint  Patient presents with   Back Pain   APPT 1515    HPI Kimberly Santiago is a 45 y.o. female.  Patient presents with bilateral mid back pain intermittently x3 days.  No falls or trauma.  She states it feels like a muscle spasm.  The pain is nonradiating; worse with deep breath and movement; improves with Flexeril.  She denies numbness, weakness, paresthesias, saddle anesthesia, loss of bowel/bladder control, abdominal pain, dysuria, hematuria, or other symptoms.  Her medical history includes GERD, Barrett's esophagus, asthma, morbid obesity, bariatric surgery, seasonal allergies, thyroid nodule.  The history is provided by the patient and medical records.   Past Medical History:  Diagnosis Date   Anemia    Asthma    Barrett esophagus    Complication of anesthesia    Pt stopped breathing during EGD but did well with recent Gastric Bypass Surgery   Depression    GERD (gastroesophageal reflux disease)    H/O   Headache    H/O MIGRAINES   History of hiatal hernia    SMALL   PCOS (polycystic ovarian syndrome)    PONV (postoperative nausea and vomiting)    Sleep apnea    (mild) PT IS NOT USING CPAP   Status post repeat low transverse cesarean section 07/27/2019    Patient Active Problem List   Diagnosis Date Noted   Barrett's esophagus without dysplasia    Abnormal uterine bleeding (AUB) 03/17/2020   Polycystic ovarian syndrome 03/17/2020   OSA (obstructive sleep apnea) 03/09/2020   GERD (gastroesophageal reflux disease)    BMI 45.0-49.9, adult (HCC) 03/17/2018   Seasonal allergic rhinitis 01/12/2018   Anxiety 08/21/2017   Depression 08/21/2017   Postoperative malabsorption 09/19/2015   Status post bariatric surgery 09/27/2014   Mild intermittent asthma without complication 08/09/2014   Insulin resistance 12/20/2013   Thyroid nodule 12/20/2013    H/O thyroid nodule 07/29/2011    Past Surgical History:  Procedure Laterality Date   CESAREAN SECTION N/A 08/18/2017   Procedure: Primary CESAREAN SECTION;  Surgeon: Noland FordyceFogleman, Rudine Rieger, MD;  [facility];  Service: Obstetrics;  Laterality: N/A;  EDD: 09/13/17 Allergy: Amoxicillin, Morphine, Hydrocodone   CESAREAN SECTION WITH BILATERAL TUBAL LIGATION Bilateral 07/27/2019   Procedure: Repeat CESAREAN SECTION WITH BILATERAL TUBAL LIGATION;  Surgeon: Noland FordyceFogleman, Merisa Julio, MD;  Location: [facility];  Service: Obstetrics;  Laterality: Bilateral;  EDD: 07/31/19   CHOLECYSTECTOMY     COLONOSCOPY WITH PROPOFOL N/A 07/07/2020   Procedure: COLONOSCOPY WITH PROPOFOL;  Surgeon: Midge MiniumWohl, Darren, MD;  [facility];  Service: Endoscopy;  Laterality: N/A;   DIAGNOSTIC LAPAROSCOPY     EAR CYST EXCISION N/A 11/24/2014   Procedure: CYST REMOVAL;  Surgeon: Bud Facereighton Vaught, MD;  Location: [facility];  Service: ENT;  Laterality: N/A;  upper lip   ESOPHAGOGASTRODUODENOSCOPY     ESOPHAGOGASTRODUODENOSCOPY (EGD) WITH PROPOFOL N/A 07/07/2020   Procedure: ESOPHAGOGASTRODUODENOSCOPY (EGD) WITH PROPOFOL;  Surgeon: Midge MiniumWohl, Darren, MD;  [facility];  Service: Endoscopy;  Laterality: N/A;  sleep apnea   ETHMOIDECTOMY Right 05/07/2018   Procedure: ETHMOIDECTOMY;  Surgeon: Bud FaceVaught, Creighton, MD;  Location: [facility];  Service: ENT;  Laterality: Right;   FRONTAL SINUS EXPLORATION Right 05/07/2018   Procedure: FRONTAL SINUS EXPLORATION;  Surgeon: Bud FaceVaught, Creighton, MD;  Location: [facility];  Service: ENT;  Laterality: Right;  GASTRIC BYPASS  March 2016   IMAGE GUIDED SINUS SURGERY N/A 05/07/2018   Procedure: IMAGE GUIDED SINUS SURGERY;  Surgeon: Bud Face, MD;  Location: [facility];  Service: ENT;  Laterality: N/A;   KNEE ARTHROSCOPY Right    MAXILLARY ANTROSTOMY Bilateral 05/07/2018   Procedure: MAXILLARY ANTROSTOMY;  Surgeon: Bud Face, MD;  Location: [facility];  Service: ENT;   Laterality: Bilateral;   NASAL SEPTOPLASTY W/ TURBINOPLASTY Bilateral 05/07/2018   Procedure: NASAL SEPTOPLASTY WITH TURBINATE REDUCTION;  Surgeon: Bud Face, MD;  Location: [facility];  Service: ENT;  Laterality: Bilateral;   SPHENOIDECTOMY Right 05/07/2018   Procedure: Selina Cooley;  Surgeon: Bud Face, MD;  Location: [facility];  Service: ENT;  Laterality: Right;   TEMPOROMANDIBULAR JOINT SURGERY     WISDOM TOOTH EXTRACTION      OB History     Gravida  2   Para  2   Term  1   Preterm  1   AB      Living  2      SAB      IAB      Ectopic      Multiple  0   Live Births  2            Home Medications    Prior to Admission medications   Medication Sig Start Date End Date Taking? Authorizing Provider  acetaminophen (TYLENOL) 325 MG tablet Take 650 mg by mouth every 6 (six) hours as needed for mild pain or headache.   Yes [provider]  busPIRone (BUSPAR) 7.5 MG tablet Take 7.5 mg by mouth at bedtime.   Yes [provider]  citalopram (CELEXA) 40 MG tablet TAKE 1 TABLET BY MOUTH EVERYDAY AT BEDTIME 09/18/20  Yes Johnson, Megan P, DO  famotidine (PEPCID) 20 MG tablet Take 1 tablet (20 mg total) by mouth at bedtime. 09/18/20  Yes Johnson, Megan P, DO  fexofenadine (ALLEGRA) 180 MG tablet Take 180 mg by mouth daily.   Yes [provider]  norethindrone-ethinyl estradiol-FE (LOESTRIN FE) 1-20 MG-MCG tablet Take 1 tablet by mouth daily.   Yes [provider]  pantoprazole (PROTONIX) 40 MG tablet Take 1 tablet (40 mg total) by mouth daily. 09/18/20  Yes Johnson, Megan P, DO  loratadine (CLARITIN) 10 MG tablet Take 10 mg by mouth daily as needed for allergies.    [provider]  nystatin (MYCOSTATIN/NYSTOP) powder Apply 1 application topically 3 (three) times daily. 09/18/20   Johnson, Megan P, DO  fluticasone (FLONASE) 50 MCG/ACT nasal spray Place 2 sprays into both nostrils at bedtime.  05/15/20  [provider]    Family History Family History  Problem Relation Age of Onset   Breast cancer Paternal Aunt        pat great aunt   Heart disease Maternal Grandmother    Heart disease Maternal Grandfather    Heart disease Paternal Grandmother    Heart disease Paternal Grandfather    Anxiety disorder Mother    Depression Mother    Miscarriages / [place] Sister     Social History Social History   Tobacco Use   Smoking status: Never   Smokeless tobacco: Never  Vaping Use   Vaping Use: Never used  Substance Use Topics   Alcohol use: Not Currently   Drug use: No     Allergies   Hydromorphone, Hydrocodone, Morphine and related, Nsaids, and Amoxicillin   Review of Systems Review of Systems  Constitutional:  Negative for  chills and fever.  Respiratory:  Negative for cough and shortness of breath.   Cardiovascular:  Negative for chest pain and palpitations.  Gastrointestinal:  Negative for abdominal pain and vomiting.  Genitourinary:  Negative for dysuria, hematuria and pelvic pain.  Musculoskeletal:  Positive for back pain. Negative for gait problem and neck pain.  Skin:  Negative for color change, rash and wound.  Neurological:  Negative for weakness and numbness.  All other systems reviewed and are negative.   Physical Exam Triage Vital Signs ED Triage Vitals  Enc Vitals Group     BP 12/20/20 1528 110/75     Pulse Rate 12/20/20 1528 90     Resp 12/20/20 1528 18     Temp 12/20/20 1528 98.8 F (37.1 C)     Temp Source 12/20/20 1528 Oral     SpO2 12/20/20 1528 96 %     Weight --      Height --      Head Circumference --      Peak Flow --      Pain Score 12/20/20 1527 8     Pain Loc --      Pain Edu? --      Excl. in GC? --    No data found.  Updated Vital Signs BP 110/75 (BP Location: Left Arm)   Pulse 90   Temp 98.8 F (37.1 C) (Oral)   Resp 18   LMP  (LMP Unknown)   SpO2 96%   Visual Acuity Right Eye Distance:   Left Eye Distance:    Bilateral Distance:    Right Eye Near:   Left Eye Near:    Bilateral Near:     Physical Exam Vitals and nursing note reviewed.  Constitutional:      General: She is not in acute distress.    Appearance: She is well-developed. She is obese. She is not ill-appearing.  HENT:     Head: Normocephalic and atraumatic.     Mouth/Throat:     Mouth: Mucous membranes are moist.  Eyes:     Conjunctiva/sclera: Conjunctivae normal.  Cardiovascular:     Rate and Rhythm: Normal rate and regular rhythm.     Heart sounds: Normal heart sounds.  Pulmonary:     Effort: Pulmonary effort is normal. No respiratory distress.     Breath sounds: Normal breath sounds.  Abdominal:     Palpations: Abdomen is soft.     Tenderness: There is no abdominal tenderness. There is no right CVA tenderness, left CVA tenderness, guarding or rebound.  Musculoskeletal:        General: No swelling, tenderness, deformity or signs of injury. Normal range of motion.     Cervical back: Neck supple.  Skin:    General: Skin is warm and dry.     Findings: No bruising, erythema, lesion or rash.  Neurological:     General: No focal deficit present.     Mental Status: She is alert and oriented to person, place, and time.     Sensory: No sensory deficit.     Motor: No weakness.     Gait: Gait normal.  Psychiatric:        Mood and Affect: Mood normal.        Behavior: Behavior normal.     UC Treatments / Results  Labs (all labs ordered are listed, but only abnormal results are displayed) Labs Reviewed  POCT URINALYSIS DIP (MANUAL ENTRY) - Abnormal; Notable for the following components:  Result Value   Glucose, UA >=1,000 (*)    Ketones, POC UA trace (5) (*)    Spec Grav, UA >=1.030 (*)    All other components within normal limits  POCT FASTING CBG [facility] MANUAL ENTRY - Abnormal; Notable for the following components:   POCT Glucose ([facility]) 54 (*)    All other components within normal limits  POCT FASTING CBG [facility]  MANUAL ENTRY    EKG   Radiology No results found.  Procedures Procedures (including critical care time)  Medications Ordered in UC Medications - No data to display  Initial Impression / Assessment and Plan / UC Course  I have reviewed the triage vital signs and the nursing notes.  Pertinent labs & imaging results that were available during my care of the patient were reviewed by me and considered in my medical decision making (see chart for details).   Acute mid back pain, Glucosuria, Hypoglycemia.  >1000 urine glucose.  CBG 54; recheck 81 after applesauce given here.  Discussed with patient that she needs higher level of care.  Discussed transfer to the ED.  Patient declines EMS and states she feels stable to drive herself.  She denies symptoms other than her back pain.  She is alert, well-appearing, vital signs are stable.      Final Clinical Impressions(s) / UC Diagnoses   Final diagnoses:  Acute bilateral thoracic back pain  Glucosuria  Hypoglycemia     Discharge Instructions      Go to the emergency department for evaluation of your acute mid back pain, high sugar level in your urine, low blood sugar level.     ED Prescriptions   None    PDMP not reviewed this encounter.   Mickie Bail, NP 12/20/20 1607    Mickie Bail, NP 12/20/20 [phone]

## 2020-12-20 NOTE — ED Triage Notes (Signed)
Pt comes into the ED via POV c/o low back pain and glucose in her bladder.  Pt denies any difficulty urinating or pain.  Pt denies being diabetic.  Pt currently has even and unlabored respirations.

## 2020-12-20 NOTE — ED Provider Notes (Signed)
[facility] Emergency Department Provider Note   ____________________________________________   Event Date/Time   First MD Initiated Contact with Patient 12/20/20 1920     (approximate)  I have reviewed the triage vital signs and the nursing notes.   HISTORY  Chief Complaint Back Pain    HPI Kimberly Santiago is a 45 y.o. female with below stated past medical history who presents for low back pain that has been present over the last 3 days.  Patient describes 8/10, nonradiating, bilateral low back pain that is worse with any movement and alleviated with 2.5 mg of cyclobenzaprine.  Patient states she is prescribed this muscle relaxer for TMJ and it significantly alleviated her symptoms yesterday however they recurred today.  Patient was seen at urgent care earlier today and was told that her urinalysis was significant for >1000 glucose patient to present to the emergency department for further evaluation.  Patient currently denies any vision changes, tinnitus, difficulty speaking, facial droop, sore throat, chest pain, shortness of breath, abdominal pain, nausea/vomiting/diarrhea, dysuria, or weakness/numbness/paresthesias in any extremity         Past Medical History:  Diagnosis Date   Anemia    Asthma    Barrett esophagus    Complication of anesthesia    Pt stopped breathing during EGD but did well with recent Gastric Bypass Surgery   Depression    GERD (gastroesophageal reflux disease)    H/O   Headache    H/O MIGRAINES   History of hiatal hernia    SMALL   PCOS (polycystic ovarian syndrome)    PONV (postoperative nausea and vomiting)    Sleep apnea    (mild) PT IS NOT USING CPAP   Status post repeat low transverse cesarean section 07/27/2019    Patient Active Problem List   Diagnosis Date Noted   Barrett's esophagus without dysplasia    Abnormal uterine bleeding (AUB) 03/17/2020   Polycystic ovarian syndrome 03/17/2020   OSA (obstructive  sleep apnea) 03/09/2020   GERD (gastroesophageal reflux disease)    BMI 45.0-49.9, adult (HCC) 03/17/2018   Seasonal allergic rhinitis 01/12/2018   Anxiety 08/21/2017   Depression 08/21/2017   Postoperative malabsorption 09/19/2015   Status post bariatric surgery 09/27/2014   Mild intermittent asthma without complication 08/09/2014   Insulin resistance 12/20/2013   Thyroid nodule 12/20/2013   H/O thyroid nodule 07/29/2011    Past Surgical History:  Procedure Laterality Date   CESAREAN SECTION N/A 08/18/2017   Procedure: Primary CESAREAN SECTION;  Surgeon: Noland Fordyce, MD;  Location: [facility];  Service: Obstetrics;  Laterality: N/A;  EDD: 09/13/17 Allergy: Amoxicillin, Morphine, Hydrocodone   CESAREAN SECTION WITH BILATERAL TUBAL LIGATION Bilateral 07/27/2019   Procedure: Repeat CESAREAN SECTION WITH BILATERAL TUBAL LIGATION;  Surgeon: Noland Fordyce, MD;  Location: [facility];  Service: Obstetrics;  Laterality: Bilateral;  EDD: 07/31/19   CHOLECYSTECTOMY     COLONOSCOPY WITH PROPOFOL N/A 07/07/2020   Procedure: COLONOSCOPY WITH PROPOFOL;  Surgeon: Midge Minium, MD;  Location: [facility];  Service: Endoscopy;  Laterality: N/A;   DIAGNOSTIC LAPAROSCOPY     EAR CYST EXCISION N/A 11/24/2014   Procedure: CYST REMOVAL;  Surgeon: Bud Face, MD;  Location: [facility];  Service: ENT;  Laterality: N/A;  upper lip   ESOPHAGOGASTRODUODENOSCOPY     ESOPHAGOGASTRODUODENOSCOPY (EGD) WITH PROPOFOL N/A 07/07/2020   Procedure: ESOPHAGOGASTRODUODENOSCOPY (EGD) WITH PROPOFOL;  Surgeon: Midge Minium, MD;  Location: [facility];  Service: Endoscopy;  Laterality: N/A;  sleep  apnea   ETHMOIDECTOMY Right 05/07/2018   Procedure: ETHMOIDECTOMY;  Surgeon: Bud Face, MD;  Location: [facility];  Service: ENT;  Laterality: Right;   FRONTAL SINUS EXPLORATION Right 05/07/2018   Procedure: FRONTAL SINUS EXPLORATION;  Surgeon: Bud Face, MD;  Location: [facility];  Service: ENT;   Laterality: Right;   GASTRIC BYPASS  March 2016   IMAGE GUIDED SINUS SURGERY N/A 05/07/2018   Procedure: IMAGE GUIDED SINUS SURGERY;  Surgeon: Bud Face, MD;  Location: [facility];  Service: ENT;  Laterality: N/A;   KNEE ARTHROSCOPY Right    MAXILLARY ANTROSTOMY Bilateral 05/07/2018   Procedure: MAXILLARY ANTROSTOMY;  Surgeon: Bud Face, MD;  Location: [facility];  Service: ENT;  Laterality: Bilateral;   NASAL SEPTOPLASTY W/ TURBINOPLASTY Bilateral 05/07/2018   Procedure: NASAL SEPTOPLASTY WITH TURBINATE REDUCTION;  Surgeon: Bud Face, MD;  Location: [facility];  Service: ENT;  Laterality: Bilateral;   SPHENOIDECTOMY Right 05/07/2018   Procedure: Selina Cooley;  Surgeon: Bud Face, MD;  Location: [facility];  Service: ENT;  Laterality: Right;   TEMPOROMANDIBULAR JOINT SURGERY     WISDOM TOOTH EXTRACTION      Prior to Admission medications   Medication Sig Start Date End Date Taking? Authorizing Provider  acetaminophen (TYLENOL) 325 MG tablet Take 650 mg by mouth every 6 (six) hours as needed for mild pain or headache.    [provider]  busPIRone (BUSPAR) 7.5 MG tablet Take 7.5 mg by mouth at bedtime.    [provider]  citalopram (CELEXA) 40 MG tablet TAKE 1 TABLET BY MOUTH EVERYDAY AT BEDTIME 09/18/20   Johnson, Megan P, DO  famotidine (PEPCID) 20 MG tablet Take 1 tablet (20 mg total) by mouth at bedtime. 09/18/20   Johnson, Megan P, DO  fexofenadine (ALLEGRA) 180 MG tablet Take 180 mg by mouth daily.    [provider]  loratadine (CLARITIN) 10 MG tablet Take 10 mg by mouth daily as needed for allergies.    [provider]  norethindrone-ethinyl estradiol-FE (LOESTRIN FE) 1-20 MG-MCG tablet Take 1 tablet by mouth daily.    [provider]  nystatin (MYCOSTATIN/NYSTOP) powder Apply 1 application topically 3 (three) times daily. 09/18/20   Johnson, Megan P, DO  pantoprazole (PROTONIX) 40 MG tablet Take 1 tablet (40 mg total)  by mouth daily. 09/18/20   Johnson, Megan P, DO  fluticasone (FLONASE) 50 MCG/ACT nasal spray Place 2 sprays into both nostrils at bedtime.  05/15/20  [provider]    Allergies Hydromorphone, Hydrocodone, Morphine and related, Nsaids, and Amoxicillin  Family History  Problem Relation Age of Onset   Breast cancer Paternal Aunt        pat great aunt   Heart disease Maternal Grandmother    Heart disease Maternal Grandfather    Heart disease Paternal Grandmother    Heart disease Paternal Grandfather    Anxiety disorder Mother    Depression Mother    Miscarriages / [place] Sister     Social History Social History   Tobacco Use   Smoking status: Never   Smokeless tobacco: Never  Vaping Use   Vaping Use: Never used  Substance Use Topics   Alcohol use: Not Currently   Drug use: No    Review of Systems Constitutional: No fever/chills Eyes: No visual changes. ENT: No sore throat. Cardiovascular: Denies chest pain. Respiratory: Denies shortness of breath. Gastrointestinal: No abdominal pain.  No nausea, no vomiting.  No diarrhea. Genitourinary: Negative for dysuria. Musculoskeletal: Positive for acute low back  pain Skin: Negative for rash. Neurological: Negative for headaches, weakness/numbness/paresthesias in any extremity Psychiatric: Negative for suicidal ideation/homicidal ideation   ____________________________________________   PHYSICAL EXAM:  VITAL SIGNS: ED Triage Vitals  Enc Vitals Group     BP 12/20/20 1643 108/81     Pulse Rate 12/20/20 1643 74     Resp 12/20/20 1643 18     Temp 12/20/20 1643 (!) 97.5 F (36.4 C)     Temp Source 12/20/20 1643 Oral     SpO2 12/20/20 1643 99 %     Weight 12/20/20 1650 300 lb (136.1 kg)     Height 12/20/20 1650 5\' 9"  (1.753 m)     Head Circumference --      Peak Flow --      Pain Score 12/20/20 1650 8     Pain Loc --      Pain Edu? --      Excl. in GC? --    Constitutional: Alert and oriented. Well  appearing and in no acute distress. Eyes: Conjunctivae are normal. PERRL. Head: Atraumatic. Nose: No congestion/rhinnorhea. Mouth/Throat: Mucous membranes are moist. Neck: No stridor Cardiovascular: Grossly normal heart sounds.  Good peripheral circulation. Respiratory: Normal respiratory effort.  No retractions. Gastrointestinal: Soft and nontender. No distention. Musculoskeletal: No obvious deformities.  Tenderness to palpation in bilateral lower lumbar paraspinal musculature Neurologic:  Normal speech and language. No gross focal neurologic deficits are appreciated. Skin:  Skin is warm and dry. No rash noted. Psychiatric: Mood and affect are normal. Speech and behavior are normal.  ____________________________________________   LABS (all labs ordered are listed, but only abnormal results are displayed)  Labs Reviewed  URINALYSIS, COMPLETE (UACMP) WITH MICROSCOPIC - Abnormal; Notable for the following components:      Result Value   Color, Urine YELLOW (*)    APPearance CLEAR (*)    Specific Gravity, Urine 1.034 (*)    Ketones, ur 5 (*)    Bacteria, UA RARE (*)    All other components within normal limits  BASIC METABOLIC PANEL - Abnormal; Notable for the following components:   Calcium 8.8 (*)    Anion gap 3 (*)    All other components within normal limits  CBC  CBG MONITORING, ED  POC URINE PREG, ED   PROCEDURES  Procedure(s) performed (including Critical Care):  .1-3 Lead EKG Interpretation  Date/Time: 12/20/2020 7:50 PM Performed by: 12/22/2020, MD Authorized by: Merwyn Katos, MD     Interpretation: normal     ECG rate:  73   ECG rate assessment: normal     Rhythm: sinus rhythm     Ectopy: none     Conduction: normal     ____________________________________________   INITIAL IMPRESSION / ASSESSMENT AND PLAN / ED COURSE  As part of my medical decision making, I reviewed the following data within the electronic MEDICAL RECORD NUMBER Nursing notes  reviewed and incorporated, Labs reviewed, EKG interpreted, Old chart reviewed, Radiograph reviewed and Notes from prior ED visits reviewed and incorporated        Patient presents for low back pain. Given History and Exam the patient appears to be at low risk for Spinal Cord Compression Syndrome, Vertebral Malignancy/Mets, acute Spinal Fracture, Vertebral Osteomyelitis, Epidural Abscess, Infected or Obstructing Kidney Stone.  Their presentation appears most likely to be secondary to non-emergent musculoskeletal etiology vs non-emergent disc herniation.  ED Workup: Defer imaging and labwork for outpatient follow up at this time. UA negative for any glucose or  evidence of infection Disposition: Discharge. Strict return precautions discussed with patient with full understanding. Advised patient to follow up promptly with primary care provider      ____________________________________________   FINAL CLINICAL IMPRESSION(S) / ED DIAGNOSES  Final diagnoses:  Acute bilateral low back pain without sciatica     ED Discharge Orders     None        Note:  This document was prepared using Dragon voice recognition software and may include unintentional dictation errors.    Merwyn KatosBradler, Anny Sayler K, MD 12/20/20 [phone]

## 2020-12-20 NOTE — ED Triage Notes (Signed)
Patient c/o middle back pain x 3 days.   Patient denies any fall or trauma.   Patient states " it feels like a spasm".   Patient endorses onset of symptoms began " in the middle of the day".   Patient endorses increased pain upon inhalation.   Patient has taken Flexeril 2.5 mg w/ some relief of symptoms, then reoccurrence of symptoms.   Patient has taken Tylenol with no relief of symptoms.

## 2020-12-20 NOTE — Discharge Instructions (Addendum)
Go to the emergency department for evaluation of your acute mid back pain, high sugar level in your urine, low blood sugar level.

## 2020-12-20 NOTE — Discharge Instructions (Addendum)
Please continue using your Flexeril for any continued back pain.  You may also use your Voltaren gel and a heating pad to this area as well as perform the back exercises in this handout.

## 2020-12-20 NOTE — ED Notes (Signed)
Patient is being discharged from the Urgent Care and sent to the Emergency Department via [facility]; Vehicle . Per Tresa Endo NP, patient is in need of higher level of care due to Lab Results and Middle Back Pain. Patient is aware and verbalizes understanding of plan of care.  Vitals:   12/20/20 1528  BP: 110/75  Pulse: 90  Resp: 18  Temp: 98.8 F (37.1 C)  SpO2: 96%

## 2020-12-21 NOTE — Telephone Encounter (Signed)
Late entry from 12/20/20.  Patient called from UC to speak with PCP regarding low blood glucose reading. Patient went to [facility] for back pain and UC noted blood glucose 54 , patient asymptomatic at that time. Patient reports urine sample results glucose in urine >1000. Patient reports she was given applesauce and recommended by UC to go to ED for evaluation. Patient reports she is asymptomatic and did not feel need to go to ED but wanted to speak to PCP. Recommended patient ask to have blood glucose rechecked after eating applesauce and if blood glucose remains below 70 go to ED. Call was dropped due to systems issues. Noted in chart patient went to ED for assessment.

## 2020-12-21 NOTE — Telephone Encounter (Signed)
Reason for Disposition  [1] Blood glucose < 70 mg/dL (3.9 mmol/L) or symptomatic AND [2] cause known  Answer Assessment - Initial Assessment Questions 1. SYMPTOMS: "What symptoms are you concerned about?"     Went to UC for back pain and blood glucose was checked and result was 54. Lab result for glucose in urine elevated > 1000. 2. ONSET:  "When did the symptoms start?"     Denied symptoms of low /high blood glucose only back pain 3. BLOOD GLUCOSE: "What is your blood glucose level?"      54 4. USUAL RANGE: "What is your blood glucose level usually?" (e.g., usual fasting morning value, usual evening value)     na 5. TYPE 1 or 2:  "Do you know what type of diabetes you have?"  (e.g., Type 1, Type 2, Gestational; doesn't know)      na 6. INSULIN: "Do you take insulin?" "What type of insulin(s) do you use? What is the mode of delivery? (syringe, pen; injection or pump) "When did you last give yourself an insulin dose?" (i.e., time or hours/minutes ago) "How much did you give?" (i.e., how many units)     na 7. DIABETES PILLS: "Do you take any pills for your diabetes?"     na 8. OTHER SYMPTOMS: "Do you have any symptoms?" (e.g., fever, frequent urination, difficulty breathing, vomiting)     Denies  9. LOW BLOOD GLUCOSE TREATMENT: "What have you done so far to treat the low blood glucose level?"     Given snack at UC , applesauce 10. FOOD: "When did you last eat or drink?"       na 11. ALONE: "Are you alone right now or is someone with you?"        At UC 12. PREGNANCY: "Is there any chance you are pregnant?" "When was your last menstrual period?"       na  Protocols used: Diabetes - Low Blood Sugar-A-AH

## 2020-12-21 NOTE — Telephone Encounter (Signed)
Called pt advised of Dr Henriette Combs message. Pt verbalized understanding saying she went to er and they said her urine was perfect so she doesn't know what happened

## 2020-12-21 NOTE — Telephone Encounter (Signed)
Please advise 

## 2020-12-21 NOTE — Telephone Encounter (Signed)
Appt if continues

## 2020-12-27 ENCOUNTER — Ambulatory Visit
Admission: EM | Admit: 2020-12-27 | Discharge: 2020-12-27 | Disposition: A | Discharge status: Home / Self Care | Payer: BC Managed Care – PPO | Attending: Family Medicine | Admitting: Family Medicine

## 2020-12-27 ENCOUNTER — Other Ambulatory Visit: Payer: Self-pay

## 2020-12-27 ENCOUNTER — Encounter: Payer: Self-pay | Admitting: Emergency Medicine

## 2020-12-27 DIAGNOSIS — J069 Acute upper respiratory infection, unspecified: Secondary | ICD-10-CM | POA: Diagnosis not present

## 2020-12-27 DIAGNOSIS — J029 Acute pharyngitis, unspecified: Secondary | ICD-10-CM | POA: Diagnosis not present

## 2020-12-27 DIAGNOSIS — Z20822 Contact with and (suspected) exposure to covid-19: Secondary | ICD-10-CM | POA: Diagnosis not present

## 2020-12-27 DIAGNOSIS — J4520 Mild intermittent asthma, uncomplicated: Secondary | ICD-10-CM | POA: Diagnosis not present

## 2020-12-27 DIAGNOSIS — Z886 Allergy status to analgesic agent status: Secondary | ICD-10-CM | POA: Insufficient documentation

## 2020-12-27 DIAGNOSIS — R059 Cough, unspecified: Secondary | ICD-10-CM | POA: Diagnosis not present

## 2020-12-27 DIAGNOSIS — Z9884 Bariatric surgery status: Secondary | ICD-10-CM | POA: Diagnosis not present

## 2020-12-27 DIAGNOSIS — Z885 Allergy status to narcotic agent status: Secondary | ICD-10-CM | POA: Diagnosis not present

## 2020-12-27 DIAGNOSIS — Z79899 Other long term (current) drug therapy: Secondary | ICD-10-CM | POA: Diagnosis not present

## 2020-12-27 LAB — GROUP A STREP BY PCR: Group A Strep by PCR: NOT DETECTED

## 2020-12-27 LAB — SARS CORONAVIRUS 2 (TAT 6-24 HRS): SARS Coronavirus 2: NEGATIVE

## 2020-12-27 MED ORDER — PREDNISONE 20 MG PO TABS: 40.0000 mg | ORAL_TABLET | Freq: Every day | ORAL | 0 refills | Status: AC

## 2020-12-27 MED ORDER — LIDOCAINE VISCOUS HCL 2 % MT SOLN: 15.0000 mL | OROMUCOSAL | 0 refills | Status: AC | PRN

## 2020-12-27 NOTE — ED Triage Notes (Signed)
Pt c/o cough, sore throat, nasal congestion. Started about 4 days ago. Denies fever. She has taken covid test and has been negative.

## 2020-12-27 NOTE — ED Provider Notes (Signed)
[facility]    CSN: [id number] Arrival date & time: 12/27/20  1011      History   Chief Complaint Chief Complaint  Patient presents with   Sore Throat   Cough    HPI Kimberly Santiago is a 45 y.o. female presenting for 4-day history of severe sore throat, painful swallowing, voice hoarseness, productive cough and nasal congestion.  She also admits to headache.  Patient denies any fevers or fatigue.  Denies any ear pain, sinus pain, chest pain, shortness of breath, nausea/vomiting or diarrhea.  No sick contacts and no known exposure to COVID-19.  Had a negative COVID test at home.  Patient has been taking Allegra but no other OTC meds.  She has no other complaints or concerns.  HPI  Past Medical History:  Diagnosis Date   Anemia    Asthma    Barrett esophagus    Complication of anesthesia    Pt stopped breathing during EGD but did well with recent Gastric Bypass Surgery   Depression    GERD (gastroesophageal reflux disease)    H/O   Headache    H/O MIGRAINES   History of hiatal hernia    SMALL   PCOS (polycystic ovarian syndrome)    PONV (postoperative nausea and vomiting)    Sleep apnea    (mild) PT IS NOT USING CPAP   Status post repeat low transverse cesarean section 07/27/2019    Patient Active Problem List   Diagnosis Date Noted   Barrett's esophagus without dysplasia    Abnormal uterine bleeding (AUB) 03/17/2020   Polycystic ovarian syndrome 03/17/2020   OSA (obstructive sleep apnea) 03/09/2020   GERD (gastroesophageal reflux disease)    BMI 45.0-49.9, adult (HCC) 03/17/2018   Seasonal allergic rhinitis 01/12/2018   Anxiety 08/21/2017   Depression 08/21/2017   Postoperative malabsorption 09/19/2015   Status post bariatric surgery 09/27/2014   Mild intermittent asthma without complication 08/09/2014   Insulin resistance 12/20/2013   Thyroid nodule 12/20/2013   H/O thyroid nodule 07/29/2011    Past Surgical History:  Procedure Laterality Date    CESAREAN SECTION N/A 08/18/2017   Procedure: Primary CESAREAN SECTION;  Surgeon: Noland Fordyce, MD;  Location: [facility];  Service: Obstetrics;  Laterality: N/A;  EDD: 09/13/17 Allergy: Amoxicillin, Morphine, Hydrocodone   CESAREAN SECTION WITH BILATERAL TUBAL LIGATION Bilateral 07/27/2019   Procedure: Repeat CESAREAN SECTION WITH BILATERAL TUBAL LIGATION;  Surgeon: Noland Fordyce, MD;  Location: [facility];  Service: Obstetrics;  Laterality: Bilateral;  EDD: 07/31/19   CHOLECYSTECTOMY     COLONOSCOPY WITH PROPOFOL N/A 07/07/2020   Procedure: COLONOSCOPY WITH PROPOFOL;  Surgeon: Midge Minium, MD;  Location: [facility];  Service: Endoscopy;  Laterality: N/A;   DIAGNOSTIC LAPAROSCOPY     EAR CYST EXCISION N/A 11/24/2014   Procedure: CYST REMOVAL;  Surgeon: Bud Face, MD;  Location: [facility] ORS;  Service: ENT;  Laterality: N/A;  upper lip   ESOPHAGOGASTRODUODENOSCOPY     ESOPHAGOGASTRODUODENOSCOPY (EGD) WITH PROPOFOL N/A 07/07/2020   Procedure: ESOPHAGOGASTRODUODENOSCOPY (EGD) WITH PROPOFOL;  Surgeon: Midge Minium, MD;  Location: [facility];  Service: Endoscopy;  Laterality: N/A;  sleep apnea   ETHMOIDECTOMY Right 05/07/2018   Procedure: ETHMOIDECTOMY;  Surgeon: Bud Face, MD;  Location: [facility] OR[facility];  Service: ENT;  Laterality: Right;   FRONTAL SINUS EXPLORATION Right 05/07/2018   Procedure: FRONTAL SINUS EXPLORATION;  Surgeon: Bud Face, MD;  Location: [facility] OR[facility];  Service: ENT;  Laterality: Right;   GASTRIC BYPASS  March  2016   IMAGE GUIDED SINUS SURGERY N/A 05/07/2018   Procedure: IMAGE GUIDED SINUS SURGERY;  Surgeon: Bud Face, MD;  Location: [facility] ORS;  Service: ENT;  Laterality: N/A;   KNEE ARTHROSCOPY Right    MAXILLARY ANTROSTOMY Bilateral 05/07/2018   Procedure: MAXILLARY ANTROSTOMY;  Surgeon: Bud Face, MD;  Location: [facility] ORS;  Service: ENT;  Laterality: Bilateral;   NASAL SEPTOPLASTY W/ TURBINOPLASTY Bilateral 05/07/2018   Procedure:  NASAL SEPTOPLASTY WITH TURBINATE REDUCTION;  Surgeon: Bud Face, MD;  Location: [facility] OR[facility];  Service: ENT;  Laterality: Bilateral;   SPHENOIDECTOMY Right 05/07/2018   Procedure: Selina Cooley;  Surgeon: Bud Face, MD;  Location: [facility] ORS;  Service: ENT;  Laterality: Right;   TEMPOROMANDIBULAR JOINT SURGERY     WISDOM TOOTH EXTRACTION      OB History     Gravida  2   Para  2   Term  1   Preterm  1   AB      Living  2      SAB      IAB      Ectopic      Multiple  0   Live Births  2            Home Medications    Prior to Admission medications   Medication Sig Start Date End Date Taking? Authorizing Provider  acetaminophen (TYLENOL) 325 MG tablet Take 650 mg by mouth every 6 (six) hours as needed for mild pain or headache.   Yes [provider]  busPIRone (BUSPAR) 7.5 MG tablet Take 7.5 mg by mouth at bedtime.   Yes [provider]  citalopram (CELEXA) 40 MG tablet TAKE 1 TABLET BY MOUTH EVERYDAY AT BEDTIME 09/18/20  Yes Johnson, Megan P, DO  famotidine (PEPCID) 20 MG tablet Take 1 tablet (20 mg total) by mouth at bedtime. 09/18/20  Yes Johnson, Megan P, DO  fexofenadine (ALLEGRA) 180 MG tablet Take 180 mg by mouth daily.   Yes [provider]  lidocaine (XYLOCAINE) 2 % solution Use as directed 15 mLs in the mouth or throat as needed for up to 5 days for mouth pain (swish and spit). 12/27/20 01/01/21 Yes Shirlee Latch, PA-C  norethindrone-ethinyl estradiol-FE (LOESTRIN FE) 1-20 MG-MCG tablet Take 1 tablet by mouth daily.   Yes [provider]  pantoprazole (PROTONIX) 40 MG tablet Take 1 tablet (40 mg total) by mouth daily. 09/18/20  Yes Johnson, Megan P, DO  predniSONE (DELTASONE) 20 MG tablet Take 2 tablets (40 mg total) by mouth daily for 5 days. 12/27/20 01/01/21 Yes Shirlee Latch, PA-C  loratadine (CLARITIN) 10 MG tablet Take 10 mg by mouth daily as needed for allergies.    [provider]  nystatin  (MYCOSTATIN/NYSTOP) powder Apply 1 application topically 3 (three) times daily. 09/18/20   Johnson, Megan P, DO  fluticasone (FLONASE) 50 MCG/ACT nasal spray Place 2 sprays into both nostrils at bedtime.  05/15/20  [provider]    Family History Family History  Problem Relation Age of Onset   Breast cancer Paternal Aunt        pat great aunt   Heart disease Maternal Grandmother    Heart disease Maternal Grandfather    Heart disease Paternal Grandmother    Heart disease Paternal Grandfather    Anxiety disorder Mother    Depression Mother    Miscarriages / [place] Sister     Social History Social History   Tobacco Use   Smoking status: Never  Smokeless tobacco: Never  Vaping Use   Vaping Use: Never used  Substance Use Topics   Alcohol use: Not Currently   Drug use: No     Allergies   Hydromorphone, Hydrocodone, Morphine and related, Nsaids, and Amoxicillin   Review of Systems Review of Systems  Constitutional:  Negative for chills, diaphoresis, fatigue and fever.  HENT:  Positive for congestion, rhinorrhea, sore throat and voice change. Negative for ear pain, sinus pressure and sinus pain.   Respiratory:  Positive for cough. Negative for shortness of breath.   Gastrointestinal:  Negative for abdominal pain, nausea and vomiting.  Musculoskeletal:  Negative for arthralgias and myalgias.  Skin:  Negative for rash.  Neurological:  Positive for headaches. Negative for weakness.  Hematological:  Negative for adenopathy.    Physical Exam Triage Vital Signs ED Triage Vitals  Enc Vitals Group     BP 12/27/20 1027 134/90     Pulse Rate 12/27/20 1027 82     Resp 12/27/20 1027 18     Temp 12/27/20 1027 98.3 F (36.8 C)     Temp Source 12/27/20 1027 Oral     SpO2 12/27/20 1027 100 %     Weight 12/27/20 1028 (!) 300 lb 0.7 oz (136.1 kg)     Height 12/27/20 1028 5\' 9"  (1.753 m)     Head Circumference --      Peak Flow --      Pain Score 12/27/20 1028 6      Pain Loc --      Pain Edu? --      Excl. in GC? --    No data found.  Updated Vital Signs BP 134/90 (BP Location: Right Arm)   Pulse 82   Temp 98.3 F (36.8 C) (Oral)   Resp 18   Ht 5\' 9"  (1.753 m)   Wt (!) 300 lb 0.7 oz (136.1 kg)   LMP  (LMP Unknown)   SpO2 100%   BMI 44.31 kg/m       Physical Exam Vitals and nursing note reviewed.  Constitutional:      General: She is not in acute distress.    Appearance: Normal appearance. She is well-developed. She is not ill-appearing or toxic-appearing.  HENT:     Head: Normocephalic and atraumatic.     Nose: Congestion present.     Mouth/Throat:     Mouth: Mucous membranes are moist.     Pharynx: Oropharynx is clear. Posterior oropharyngeal erythema present.     Tonsils: 2+ on the right. 1+ on the left.  Eyes:     General: No scleral icterus.       Right eye: No discharge.        Left eye: No discharge.     Conjunctiva/sclera: Conjunctivae normal.  Cardiovascular:     Rate and Rhythm: Normal rate and regular rhythm.     Heart sounds: Normal heart sounds.  Pulmonary:     Effort: Pulmonary effort is normal. No respiratory distress.     Breath sounds: Normal breath sounds.  Musculoskeletal:     Cervical back: Neck supple.  Skin:    General: Skin is dry.  Neurological:     General: No focal deficit present.     Mental Status: She is alert. Mental status is at baseline.     Motor: No weakness.     Gait: Gait normal.  Psychiatric:        Mood and Affect: Mood normal.  Behavior: Behavior normal.        Thought Content: Thought content normal.     UC Treatments / Results  Labs (all labs ordered are listed, but only abnormal results are displayed) Labs Reviewed  GROUP A STREP BY PCR  SARS CORONAVIRUS 2 (TAT 6-24 HRS)    EKG   Radiology No results found.  Procedures Procedures (including critical care time)  Medications Ordered in UC Medications - No data to display  Initial Impression / Assessment  and Plan / UC Course  I have reviewed the triage vital signs and the nursing notes.  Pertinent labs & imaging results that were available during my care of the patient were reviewed by me and considered in my medical decision making (see chart for details).  45 year old female presenting with 4-day history of severe sore throat, cough and congestion.  Vitals are all stable.  Molecular strep test is negative.  PCR COVID test sent.  Current CDC guidelines, isolation protocol and ED precautions reviewed with patient.  Advised her this could be COVID-19 or other viral illness.  Supportive care encouraged with increasing rest and fluids, starting Mucinex D and I sent in prednisone to help with the tonsil swelling and viscous lidocaine to help with the pain.  Advised following up if not feeling better in the next week with PCP or our department.  Advise following up sooner for any acute worsening of symptoms.  Work note given.   Final Clinical Impressions(s) / UC Diagnoses   Final diagnoses:  Upper respiratory tract infection, unspecified type  Sore throat  Cough     Discharge Instructions      URI/COLD SYMPTOMS: Your exam today is consistent with a viral illness. Antibiotics are not indicated at this time. Use medications as directed, including cough syrup, nasal saline, and decongestants. Your symptoms should improve over the next few days and resolve within 7-10 days. Increase rest and fluids. F/u if symptoms worsen or predominate such as sore throat, ear pain, productive cough, shortness of breath, or if you develop high fevers or worsening fatigue over the next several days.    You have received COVID testing today either for positive exposure, concerning symptoms that could be related to COVID infection, screening purposes, or re-testing after confirmed positive.  Your test obtained today checks for active viral infection in the last 1-2 weeks. If your test is negative now, you can still test  positive later. So, if you do develop symptoms you should either get re-tested and/or isolate x 5 days and then strict mask use x 5 days (unvaccinated) or mask use x 10 days (vaccinated). Please follow CDC guidelines.  While Rapid antigen tests come back in 15-20 minutes, send out PCR/molecular test results typically come back within 1-3 days. In the mean time, if you are symptomatic, assume this could be a positive test and treat/monitor yourself as if you do have COVID.   We will call with test results if positive. Please download the MyChart app and set up a profile to access test results.   If symptomatic, go home and rest. Push fluids. Take Tylenol as needed for discomfort. Gargle warm salt water. Throat lozenges. Take Mucinex DM or Robitussin for cough. Humidifier in bedroom to ease coughing. Warm showers. Also review the COVID handout for more information.  COVID-19 INFECTION: The incubation period of COVID-19 is approximately 14 days after exposure, with most symptoms developing in roughly 4-5 days. Symptoms may range in severity from mild to critically severe.  Roughly 80% of those infected will have mild symptoms. People of any age may become infected with COVID-19 and have the ability to transmit the virus. The most common symptoms include: fever, fatigue, cough, body aches, headaches, sore throat, nasal congestion, shortness of breath, nausea, vomiting, diarrhea, changes in smell and/or taste.    COURSE OF ILLNESS Some patients may begin with mild disease which can progress quickly into critical symptoms. If your symptoms are worsening please call ahead to the Emergency Department and proceed there for further treatment. Recovery time appears to be roughly 1-2 weeks for mild symptoms and 3-6 weeks for severe disease.   GO IMMEDIATELY TO ER FOR FEVER YOU ARE UNABLE TO GET DOWN WITH TYLENOL, BREATHING PROBLEMS, CHEST PAIN, FATIGUE, LETHARGY, INABILITY TO EAT OR DRINK, ETC  QUARANTINE AND  ISOLATION: To help decrease the spread of COVID-19 please remain isolated if you have COVID infection or are highly suspected to have COVID infection. This means -stay home and isolate to one room in the home if you live with others. Do not share a bed or bathroom with others while ill, sanitize and wipe down all countertops and keep common areas clean and disinfected. Stay home for 5 days. If you have no symptoms or your symptoms are resolving after 5 days, you can leave your house. Continue to wear a mask around others for 5 additional days. If you have been in close contact (within 6 feet) of someone diagnosed with COVID 19, you are advised to quarantine in your home for 14 days as symptoms can develop anywhere from 2-14 days after exposure to the virus. If you develop symptoms, you  must isolate.  Most current guidelines for COVID after exposure -unvaccinated: isolate 5 days and strict mask use x 5 days. Test on day 5 is possible -vaccinated: wear mask x 10 days if symptoms do not develop -You do not necessarily need to be tested for COVID if you have + exposure and  develop symptoms. Just isolate at home x10 days from symptom onset During this global pandemic, CDC advises to practice social distancing, try to stay at least 73ft away from others at all times. Wear a face covering. Wash and sanitize your hands regularly and avoid going anywhere that is not necessary.  KEEP IN MIND THAT THE COVID TEST IS NOT 100% ACCURATE AND YOU SHOULD STILL DO EVERYTHING TO PREVENT POTENTIAL SPREAD OF VIRUS TO OTHERS (WEAR MASK, WEAR GLOVES, WASH HANDS AND SANITIZE REGULARLY). IF INITIAL TEST IS NEGATIVE, THIS MAY NOT MEAN YOU ARE DEFINITELY NEGATIVE. MOST ACCURATE TESTING IS DONE 5-7 DAYS AFTER EXPOSURE.   It is not advised by CDC to get re-tested after receiving a positive COVID test since you can still test positive for weeks to months after you have already cleared the virus.   *If you have not been vaccinated  for COVID, I strongly suggest you consider getting vaccinated as long as there are no contraindications.       ED Prescriptions     Medication Sig Dispense Auth. Provider   predniSONE (DELTASONE) 20 MG tablet Take 2 tablets (40 mg total) by mouth daily for 5 days. 10 tablet Eusebio Friendly B, PA-C   lidocaine (XYLOCAINE) 2 % solution Use as directed 15 mLs in the mouth or throat as needed for up to 5 days for mouth pain (swish and spit). 100 mL Shirlee Latch, PA-C      PDMP not reviewed this encounter.   Shirlee Latch, PA-C  12/27/20 1140  

## 2020-12-27 NOTE — Discharge Instructions (Addendum)

## 2021-01-25 DIAGNOSIS — E1159 Type 2 diabetes mellitus with other circulatory complications: Secondary | ICD-10-CM | POA: Diagnosis not present

## 2021-01-25 DIAGNOSIS — I152 Hypertension secondary to endocrine disorders: Secondary | ICD-10-CM | POA: Diagnosis not present

## 2021-01-25 DIAGNOSIS — E1169 Type 2 diabetes mellitus with other specified complication: Secondary | ICD-10-CM | POA: Diagnosis not present

## 2021-02-06 ENCOUNTER — Ambulatory Visit
Admission: RE | Admit: 2021-02-06 | Discharge: 2021-02-06 | Disposition: A | Discharge status: Home / Self Care | Payer: BC Managed Care – PPO | Source: Ambulatory Visit | Attending: Family Medicine | Admitting: Family Medicine

## 2021-02-06 ENCOUNTER — Other Ambulatory Visit: Payer: Self-pay

## 2021-02-06 ENCOUNTER — Encounter: Payer: Self-pay | Admitting: Family Medicine

## 2021-02-06 ENCOUNTER — Ambulatory Visit (INDEPENDENT_AMBULATORY_CARE_PROVIDER_SITE_OTHER): Payer: BC Managed Care – PPO | Admitting: Family Medicine

## 2021-02-06 ENCOUNTER — Ambulatory Visit
Admission: RE | Admit: 2021-02-06 | Discharge: 2021-02-06 | Disposition: A | Discharge status: Home / Self Care | Payer: BC Managed Care – PPO | Source: Home / Self Care | Attending: Family Medicine | Admitting: Family Medicine

## 2021-02-06 VITALS — BP 114/78 | HR 78 | Temp 98.4°F | Wt 306.2 lb

## 2021-02-06 DIAGNOSIS — R81 Glycosuria: Secondary | ICD-10-CM

## 2021-02-06 DIAGNOSIS — R059 Cough, unspecified: Secondary | ICD-10-CM | POA: Insufficient documentation

## 2021-02-06 LAB — URINALYSIS, ROUTINE W REFLEX MICROSCOPIC
Bilirubin, UA: NEGATIVE
Glucose, UA: NEGATIVE
Ketones, UA: NEGATIVE
Leukocytes,UA: NEGATIVE
Nitrite, UA: NEGATIVE
Protein,UA: NEGATIVE
RBC, UA: NEGATIVE
Specific Gravity, UA: 1.025 (ref 1.005–1.030)
Urobilinogen, Ur: 0.2 mg/dL (ref 0.2–1.0)
pH, UA: 6.5 (ref 5.0–7.5)

## 2021-02-06 IMAGING — DX DG CHEST 2V
2 series · 2 of 2 positions shown · non-contrast
Comparison: Chest x-ray [DATE].

CLINICAL DATA: 45-year-old female with history of cough for the
past 6 weeks.

EXAM:
CHEST - 2 VIEW

[chest lat]
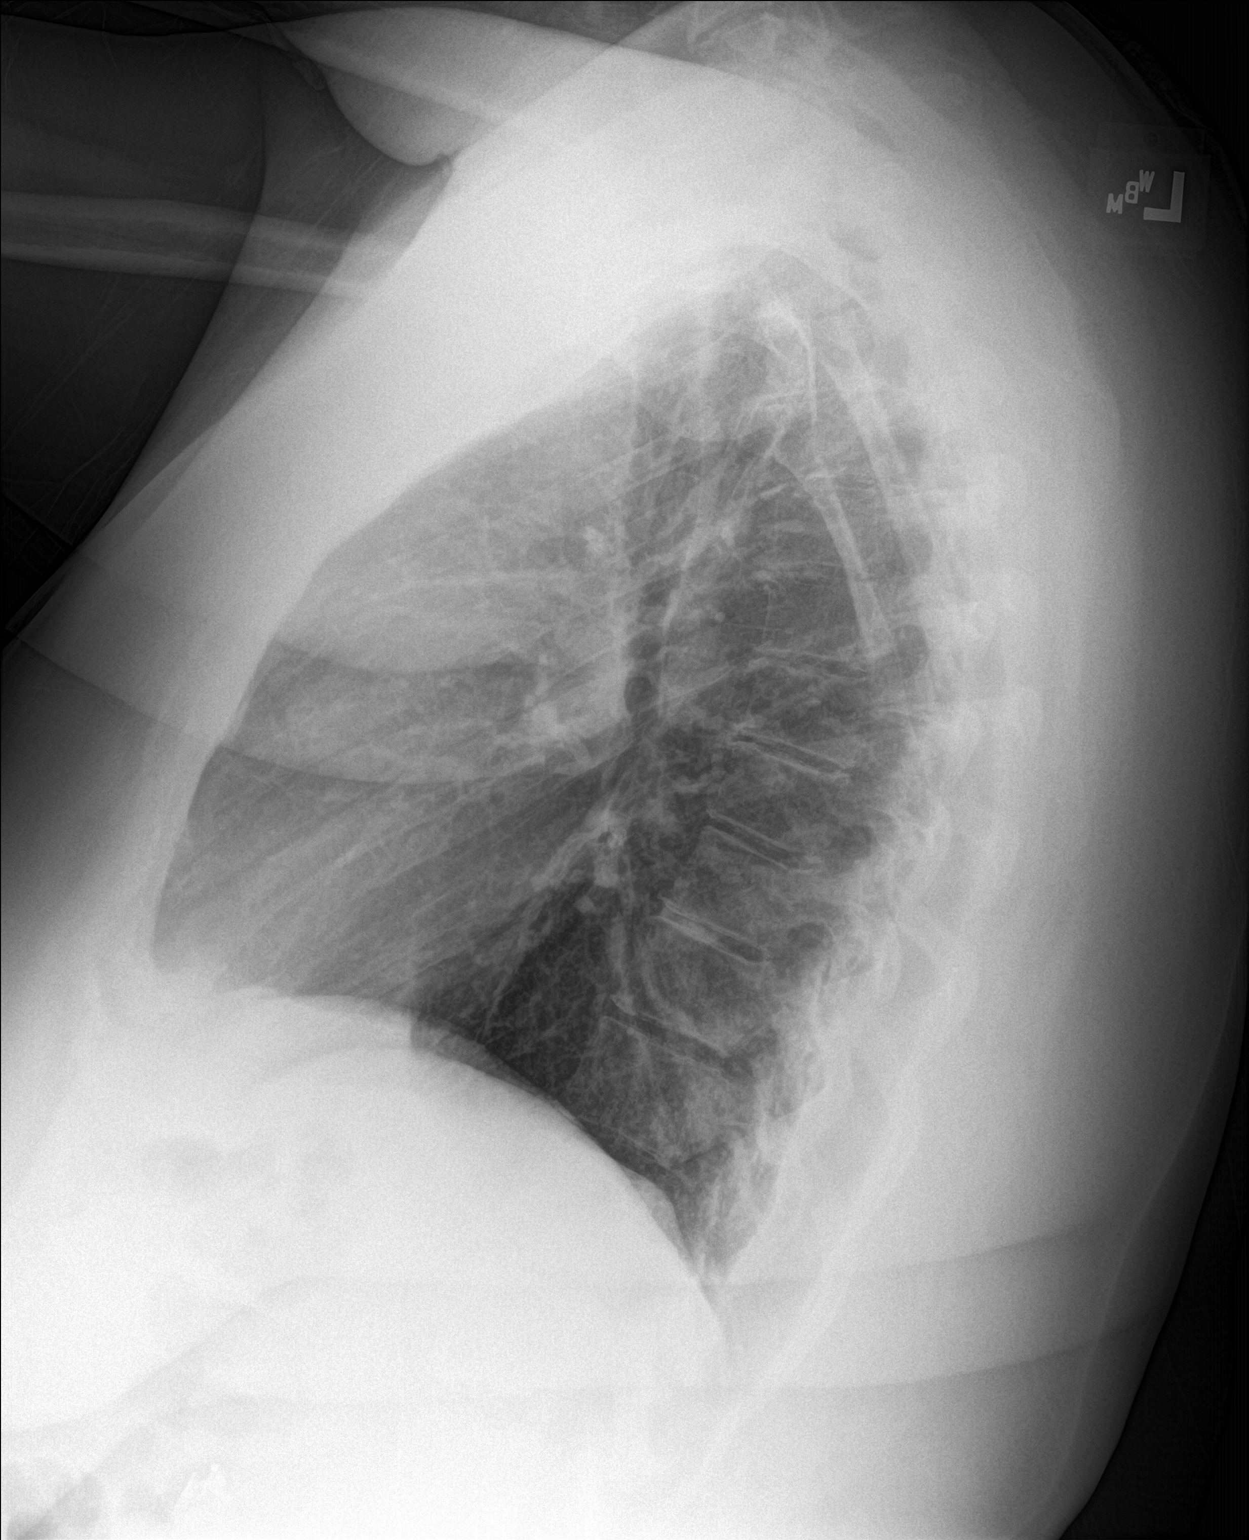

[chest pa]
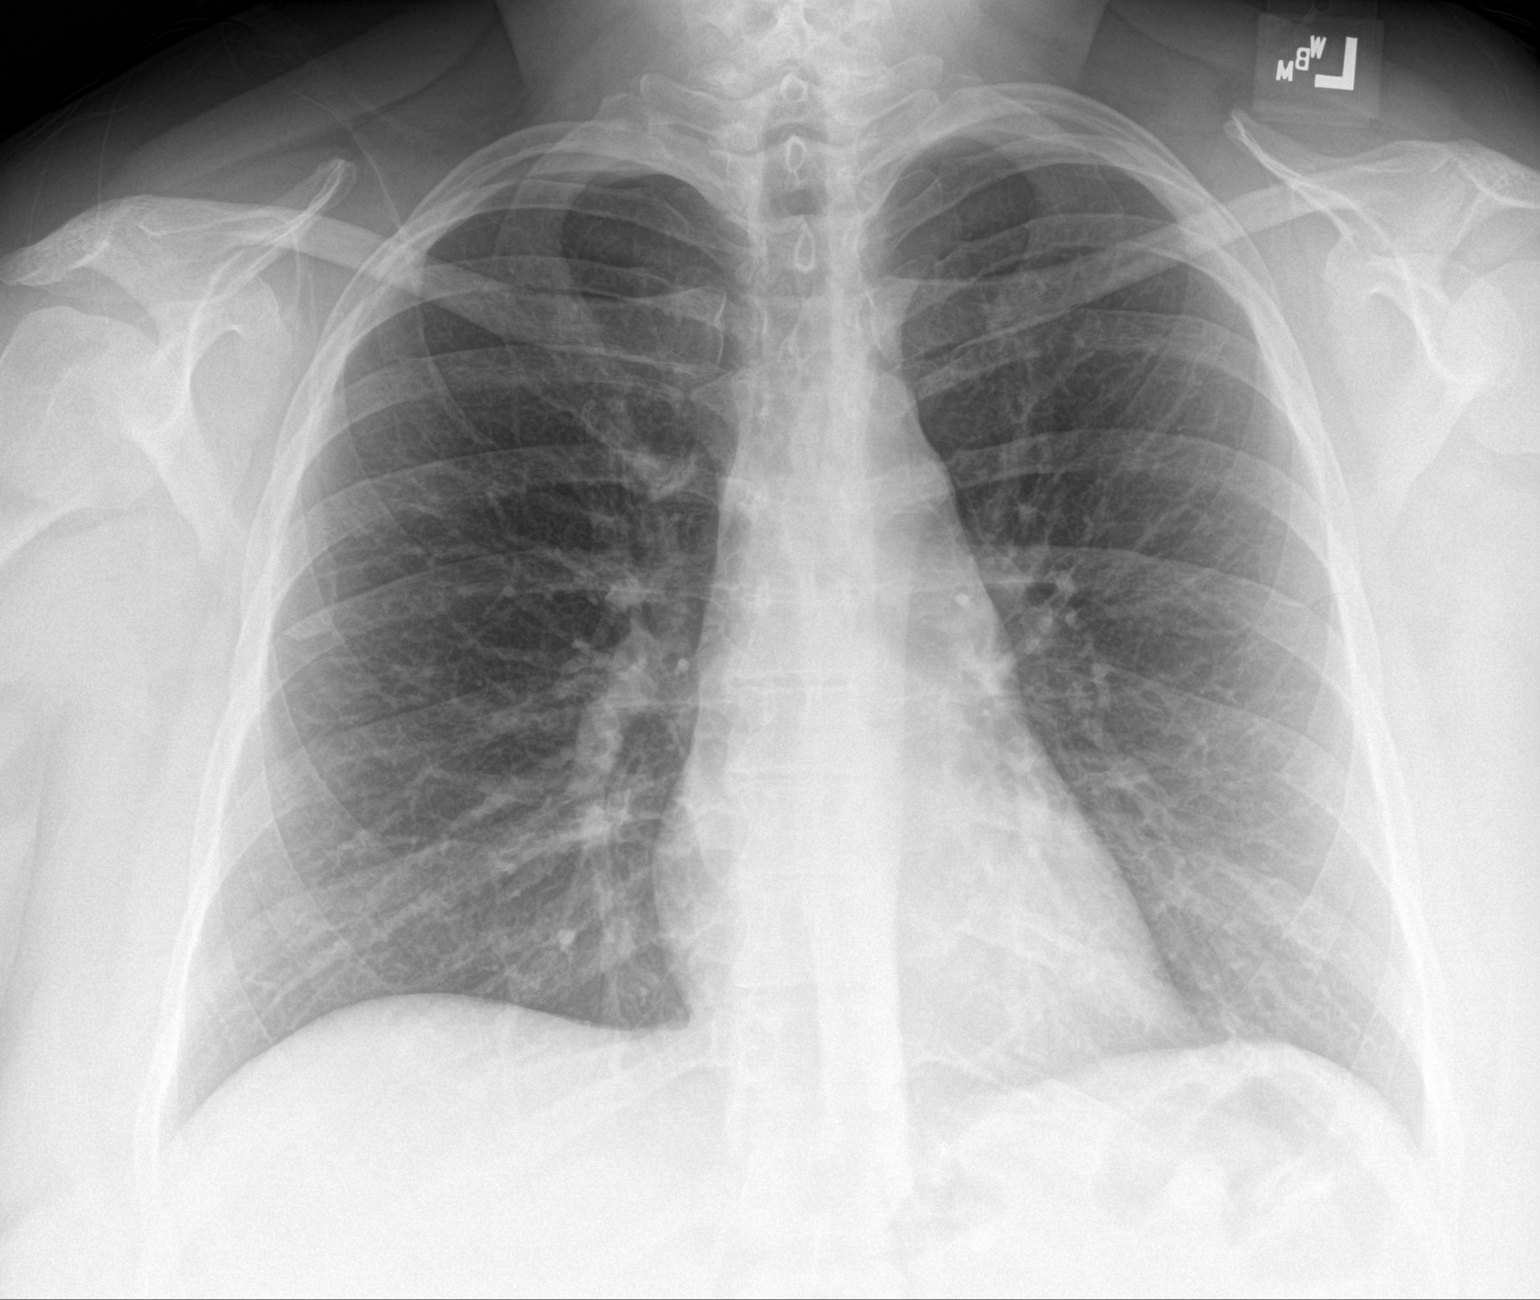

[2 of 2 positions shown; findings below may reference images not displayed]

FINDINGS: Lung volumes are normal. No consolidative airspace disease. No
pleural effusions. No pneumothorax. No pulmonary nodule or mass
noted. Pulmonary vasculature and the cardiomediastinal silhouette
are within normal limits.
IMPRESSION: No radiographic evidence of acute cardiopulmonary disease.

## 2021-02-06 MED ORDER — SCOPOLAMINE 1 MG/3DAYS TD PT72: 1.0000 | MEDICATED_PATCH | TRANSDERMAL | 12 refills | Status: DC

## 2021-02-06 MED ORDER — LIDOCAINE VISCOUS HCL 2 % MT SOLN: 5.0000 mL | Freq: Three times a day (TID) | OROMUCOSAL | 2 refills | Status: DC | PRN

## 2021-02-06 NOTE — Progress Notes (Signed)
BP 114/78   Pulse 78   Temp 98.4 F (36.9 C) (Oral)   Wt (!) 306 lb 3.2 oz (138.9 kg)   SpO2 98%   BMI 45.22 kg/m    Subjective:    Patient ID: Kimberly Santiago, female    DOB: [birth year], 44 y.o.   MRN: [id number]  HPI: Kimberly Santiago is a 45 y.o. female  Chief Complaint  Patient presents with   Sinusitis    Pt states she is here to discuss some ongoing issues she has been having. She states that she has had a cough and sore throat since 12/23/20. States she has long term sinus issues and is requesting a chest x-ray. States she has had multiple negative strep and covid tests   UPPER RESPIRATORY TRACT INFECTION Duration: 1.5 months Worst symptom: sore throat and cough Fever: no Cough: yes Shortness of breath: yes Wheezing: no Chest pain: no Chest tightness: yes Chest congestion: yes Nasal congestion: yes Runny nose: yes Post nasal drip: yes Sneezing: no Sore throat: yes Swollen glands: no Sinus pressure: no Headache: yes Face pain: no Toothache: no Ear pain: no  Ear pressure: no  Eyes red/itching:no Eye drainage/crusting: no  Vomiting: no Rash: no Fatigue: yes Sick contacts: no Strep contacts: no  Context: fluctuating Recurrent sinusitis: no Relief with OTC cold/cough medications: no  Treatments attempted: prednisone, doxycycline   Relevant past medical, surgical, family and social history reviewed and updated as indicated. Interim medical history since our last visit reviewed. Allergies and medications reviewed and updated.  Review of Systems  Constitutional: Negative.   HENT:  Positive for congestion, postnasal drip, sinus pressure and sore throat. Negative for dental problem, drooling, ear discharge, ear pain, facial swelling, hearing loss, mouth sores, nosebleeds, rhinorrhea, sinus pain, sneezing, tinnitus, trouble swallowing and voice change.   Respiratory:  Positive for cough and chest tightness. Negative for apnea, choking, shortness of breath,  wheezing and stridor.   Cardiovascular: Negative.   Gastrointestinal: Negative.   Psychiatric/Behavioral: Negative.     Per HPI unless specifically indicated above     Objective:    BP 114/78   Pulse 78   Temp 98.4 F (36.9 C) (Oral)   Wt (!) 306 lb 3.2 oz (138.9 kg)   SpO2 98%   BMI 45.22 kg/m   Wt Readings from Last 3 Encounters:  02/06/21 (!) 306 lb 3.2 oz (138.9 kg)  12/27/20 (!) 300 lb 0.7 oz (136.1 kg)  12/20/20 300 lb (136.1 kg)    Physical Exam Vitals and nursing note reviewed.  Constitutional:      General: She is not in acute distress.    Appearance: Normal appearance. She is obese. She is not ill-appearing, toxic-appearing or diaphoretic.  HENT:     Head: Normocephalic and atraumatic.     Right Ear: Tympanic membrane, ear canal and external ear normal.     Left Ear: Tympanic membrane, ear canal and external ear normal.     Nose: Nose normal.     Mouth/Throat:     Mouth: Mucous membranes are moist.     Pharynx: Oropharynx is clear.  Eyes:     General: No scleral icterus.       Right eye: No discharge.        Left eye: No discharge.     Extraocular Movements: Extraocular movements intact.     Conjunctiva/sclera: Conjunctivae normal.     Pupils: Pupils are equal, round, and reactive to light.  Cardiovascular:  Rate and Rhythm: Normal rate and regular rhythm.     Pulses: Normal pulses.     Heart sounds: Normal heart sounds. No murmur heard.   No friction rub. No gallop.  Pulmonary:     Effort: Pulmonary effort is normal. No respiratory distress.     Breath sounds: Normal breath sounds. No stridor. No wheezing, rhonchi or rales.     Comments: Coarse breath sounds bilaterally  Chest:     Chest wall: No tenderness.  Musculoskeletal:        General: Normal range of motion.     Cervical back: Normal range of motion and neck supple.  Skin:    General: Skin is warm and dry.     Capillary Refill: Capillary refill takes less than 2 seconds.     Coloration:  Skin is not jaundiced or pale.     Findings: No bruising, erythema, lesion or rash.  Neurological:     General: No focal deficit present.     Mental Status: She is alert and oriented to person, place, and time. Mental status is at baseline.  Psychiatric:        Mood and Affect: Mood normal.        Behavior: Behavior normal.        Thought Content: Thought content normal.        Judgment: Judgment normal.    Results for orders placed or performed in visit on 02/06/21  Urinalysis, Routine w reflex microscopic  Result Value Ref Range   Specific Gravity, UA 1.025 1.005 - 1.030   pH, UA 6.5 5.0 - 7.5   Color, UA Yellow Yellow   Appearance Ur Clear Clear   Leukocytes,UA Negative Negative   Protein,UA Negative Negative/Trace   Glucose, UA Negative Negative   Ketones, UA Negative Negative   RBC, UA Negative Negative   Bilirubin, UA Negative Negative   Urobilinogen, Ur 0.2 0.2 - 1.0 mg/dL   Nitrite, UA Negative Negative      Assessment & Plan:   Problem List Items Addressed This Visit   None Visit Diagnoses     Cough    -  Primary   Will check CXR to look for pneumonia. Will increase pantoprozole to see if it's LPR. Recheck 2 weeks.    Relevant Orders   DG Chest 2 View (Completed)   Spirometry with graph (Completed)   Glucosuria       Rechecking her UA today. Await results. Treat as needed.    Relevant Orders   Urinalysis, Routine w reflex microscopic (Completed)        Follow up plan: Return in about 4 weeks (around 03/06/2021).

## 2021-02-06 NOTE — Patient Instructions (Signed)
Take 2 of your pantoprazole (80mg  total)- you can take 2 in the AM or 1 in AM and 1 in PM

## 2021-02-08 ENCOUNTER — Encounter: Payer: Self-pay | Admitting: Family Medicine

## 2021-02-11 ENCOUNTER — Encounter: Payer: Self-pay | Admitting: Family Medicine

## 2021-02-11 MED ORDER — PANTOPRAZOLE SODIUM 40 MG PO TBEC: 40.0000 mg | DELAYED_RELEASE_TABLET | Freq: Two times a day (BID) | ORAL | 0 refills | Status: DC

## 2021-02-11 MED ORDER — LIDOCAINE VISCOUS HCL 2 % MT SOLN: 5.0000 mL | Freq: Three times a day (TID) | OROMUCOSAL | 2 refills | Status: DC | PRN

## 2021-03-14 ENCOUNTER — Other Ambulatory Visit: Payer: Self-pay | Admitting: Family Medicine

## 2021-03-14 DIAGNOSIS — Z1231 Encounter for screening mammogram for malignant neoplasm of breast: Secondary | ICD-10-CM

## 2021-03-20 ENCOUNTER — Ambulatory Visit: Payer: BC Managed Care – PPO | Admitting: Family Medicine

## 2021-03-24 ENCOUNTER — Other Ambulatory Visit: Payer: Self-pay | Admitting: Family Medicine

## 2021-03-24 NOTE — Telephone Encounter (Signed)
Requested medication (s) are due for refill today: no  Requested medication (s) are on the active medication list: yes   Last refill: 02/12/21  #180  0 refills  Future visit scheduled no  Notes to clinic: Pharmacy comment: Alternative Requested:PA REQUIREED FOR MORE THAN 1 TAB DAILY.  Requested Prescriptions  Pending Prescriptions Disp Refills   pantoprazole (PROTONIX) 40 MG tablet [Pharmacy Med Name: PANTOPRAZOLE SOD DR 40 MG TAB] 180 tablet 0    Sig: TAKE 1 TABLET BY MOUTH TWICE A DAY     Gastroenterology: Proton Pump Inhibitors Passed - 03/24/2021  8:04 AM      Passed - Valid encounter within last 12 months    Recent Outpatient Visits           1 month ago Cough   [facility] [place], Megan P, DO   6 months ago Routine general medical examination at a health care facility   [facility] [place], [place], DO   1 year ago Chronic fatigue   [facility], [place], DO   1 year ago Left hip pain   [facility] Particia Nearing, [place]   1 year ago Anxiety   [facility], Salley Hews, [place]       Future Appointments             In 5 days Stoioff, Verna Czech, MD [facility]

## 2021-03-26 ENCOUNTER — Other Ambulatory Visit: Payer: Self-pay | Admitting: Family Medicine

## 2021-03-26 NOTE — Telephone Encounter (Signed)
Needs pa please

## 2021-03-27 NOTE — Telephone Encounter (Signed)
Prior Authorization was initiated via CoverMyMeds. Awaiting determination from patient insurance [employer].  KEYChristene Slates

## 2021-03-28 ENCOUNTER — Ambulatory Visit
Admission: RE | Admit: 2021-03-28 | Discharge: 2021-03-28 | Disposition: A | Discharge status: Home / Self Care | Payer: BC Managed Care – PPO | Source: Ambulatory Visit | Attending: Family Medicine | Admitting: Family Medicine

## 2021-03-28 ENCOUNTER — Ambulatory Visit: Payer: BC Managed Care – PPO | Admitting: Urology

## 2021-03-28 ENCOUNTER — Other Ambulatory Visit: Payer: Self-pay

## 2021-03-28 DIAGNOSIS — Z1231 Encounter for screening mammogram for malignant neoplasm of breast: Secondary | ICD-10-CM | POA: Insufficient documentation

## 2021-03-29 ENCOUNTER — Ambulatory Visit (INDEPENDENT_AMBULATORY_CARE_PROVIDER_SITE_OTHER): Payer: BC Managed Care – PPO | Admitting: Urology

## 2021-03-29 ENCOUNTER — Encounter: Payer: Self-pay | Admitting: Urology

## 2021-03-29 VITALS — BP 125/73 | HR 109 | Ht 68.0 in | Wt 300.0 lb

## 2021-03-29 DIAGNOSIS — N3281 Overactive bladder: Secondary | ICD-10-CM

## 2021-03-29 DIAGNOSIS — N393 Stress incontinence (female) (male): Secondary | ICD-10-CM

## 2021-03-29 DIAGNOSIS — N3941 Urge incontinence: Secondary | ICD-10-CM

## 2021-03-29 DIAGNOSIS — R32 Unspecified urinary incontinence: Secondary | ICD-10-CM

## 2021-03-29 MED ORDER — GEMTESA 75 MG PO TABS: ORAL_TABLET | ORAL | 0 refills | Status: DC

## 2021-03-29 NOTE — Progress Notes (Signed)
03/29/2021 2:50 PM   Kimberly Santiago [birth year] [id number]  Referring provider: Dorcas Carrow, DO [address] [place],  [place] [address]  Chief Complaint  Patient presents with   Urinary Incontinence    HPI: Kimberly Santiago is a 45 y.o. female referred for evaluation of urinary incontinence.  Complains of urinary incontinence since 2006 Had seen a urologist in [place] for 1 visit and was never treated Decided to defer any additional evaluation until she was finished having children Current symptoms include urinary frequency, urgency with urge incontinence Currently wears pads changing them 3 times per day Mild stress urinary incontinence Feels her urge is more bothersome Denies dysuria, gross hematuria or recurrent UTI Denies flank, abdominal or pelvic pain No history neurologic problems No bowel complaints   PMH: Past Medical History:  Diagnosis Date   Anemia    Asthma    Barrett esophagus    Complication of anesthesia    Pt stopped breathing during EGD but did well with recent Gastric Bypass Surgery   Depression    GERD (gastroesophageal reflux disease)    H/O   Headache    H/O MIGRAINES   History of hiatal hernia    SMALL   PCOS (polycystic ovarian syndrome)    PONV (postoperative nausea and vomiting)    Sleep apnea    (mild) PT IS NOT USING CPAP   Status post repeat low transverse cesarean section 07/27/2019    Surgical History: Past Surgical History:  Procedure Laterality Date   CESAREAN SECTION N/A 08/18/2017   Procedure: Primary CESAREAN SECTION;  Surgeon: Noland Fordyce, MD;  Location: [facility];  Service: Obstetrics;  Laterality: N/A;  EDD: 09/13/17 Allergy: Amoxicillin, Morphine, Hydrocodone   CESAREAN SECTION WITH BILATERAL TUBAL LIGATION Bilateral 07/27/2019   Procedure: Repeat CESAREAN SECTION WITH BILATERAL TUBAL LIGATION;  Surgeon: Noland Fordyce, MD;  [facility]: [facility];  Service: Obstetrics;  Laterality: Bilateral;  EDD: 07/31/19    CHOLECYSTECTOMY     COLONOSCOPY WITH PROPOFOL N/A 07/07/2020   Procedure: COLONOSCOPY WITH PROPOFOL;  Surgeon: Midge Minium, MD;  Location: [facility];  Service: Endoscopy;  Laterality: N/A;   DIAGNOSTIC LAPAROSCOPY     EAR CYST EXCISION N/A 11/24/2014   Procedure: CYST REMOVAL;  Surgeon: Bud Face, MD;  Location: [facility] ORS;  Service: ENT;  Laterality: N/A;  upper lip   ESOPHAGOGASTRODUODENOSCOPY     ESOPHAGOGASTRODUODENOSCOPY (EGD) WITH PROPOFOL N/A 07/07/2020   Procedure: ESOPHAGOGASTRODUODENOSCOPY (EGD) WITH PROPOFOL;  Surgeon: Midge Minium, MD;  Location: [facility];  Service: Endoscopy;  Laterality: N/A;  sleep apnea   ETHMOIDECTOMY Right 05/07/2018   Procedure: ETHMOIDECTOMY;  Surgeon: Bud Face, MD;  Location: [facility] ORS;  Service: ENT;  Laterality: Right;   FRONTAL SINUS EXPLORATION Right 05/07/2018   Procedure: FRONTAL SINUS EXPLORATION;  Surgeon: Bud Face, MD;  Location: [facility] ORS;  Service: ENT;  Laterality: Right;   GASTRIC BYPASS  March 2016   IMAGE GUIDED SINUS SURGERY N/A 05/07/2018   Procedure: IMAGE GUIDED SINUS SURGERY;  Surgeon: Bud Face, MD;  Location: [facility] ORS;  Service: ENT;  Laterality: N/A;   KNEE ARTHROSCOPY Right    MAXILLARY ANTROSTOMY Bilateral 05/07/2018   Procedure: MAXILLARY ANTROSTOMY;  Surgeon: Bud Face, MD;  Location: [facility] OR[facility];  Service: ENT;  Laterality: Bilateral;   NASAL SEPTOPLASTY W/ TURBINOPLASTY Bilateral 05/07/2018   Procedure: NASAL SEPTOPLASTY WITH TURBINATE REDUCTION;  Surgeon: Bud Face, MD;  Location: [facility] ORS;  Service: ENT;  Laterality: Bilateral;   SPHENOIDECTOMY Right 05/07/2018  Procedure: SPHENOIDECTOMY;  Surgeon: Bud Face, MD;  Location: [facility] ORS;  Service: ENT;  Laterality: Right;   TEMPOROMANDIBULAR JOINT SURGERY     WISDOM TOOTH EXTRACTION      Home Medications:  Allergies as of 03/29/2021       Reactions   Hydromorphone Shortness Of Breath, Nausea And Vomiting, Other  (See Comments)   Chest pain   Hydrocodone Nausea And Vomiting, Other (See Comments)   Headache, Patient can take oxycodone.   Morphine And Related Nausea And Vomiting, Other (See Comments)   Headache   Nsaids Other (See Comments)   Pt cannot take due to Gastric Bypass Surgery   Amoxicillin Itching   Can take cephalexin. Did it involve swelling of the face/tongue/throat, SOB, or low BP? No Did it involve sudden or severe rash/hives, skin peeling, or any reaction on the inside of your mouth or nose? No Did you need to seek medical attention at a hospital or doctor's office? No When did it last happen?  Several years     If all above answers are "NO", may proceed with cephalosporin use.        Medication List        Accurate as of March 29, 2021  2:50 PM. If you have any questions, ask your nurse or doctor.          acetaminophen 325 MG tablet Commonly known as: TYLENOL Take 650 mg by mouth every 6 (six) hours as needed for mild pain or headache.   busPIRone 7.5 MG tablet Commonly known as: BUSPAR Take 7.5 mg by mouth at bedtime.   citalopram 40 MG tablet Commonly known as: CELEXA TAKE 1 TABLET BY MOUTH EVERYDAY AT BEDTIME   famotidine 20 MG tablet Commonly known as: PEPCID Take 1 tablet (20 mg total) by mouth at bedtime.   fexofenadine 180 MG tablet Commonly known as: ALLEGRA Take 180 mg by mouth daily.   Junel 1/20 1-20 MG-MCG tablet Generic drug: norethindrone-ethinyl estradiol Take 1 tablet by mouth daily.   magic mouthwash (lidocaine, diphenhydrAMINE, alum & mag hydroxide) suspension Swish and swallow 5 mLs 3 (three) times daily as needed for mouth pain.   nystatin powder Commonly known as: MYCOSTATIN/NYSTOP Apply 1 application topically 3 (three) times daily.   pantoprazole 40 MG tablet Commonly known as: PROTONIX TAKE 1 TABLET BY MOUTH TWICE A DAY   scopolamine 1 MG/3DAYS Commonly known as: Transderm-Scop (1.5 MG) Place 1 patch (1.5 mg total)  onto the skin every 3 (three) days.        Allergies:  Allergies  Allergen Reactions   Hydromorphone Shortness Of Breath, Nausea And Vomiting and Other (See Comments)    Chest pain   Hydrocodone Nausea And Vomiting and Other (See Comments)    Headache, Patient can take oxycodone.   Morphine And Related Nausea And Vomiting and Other (See Comments)    Headache   Nsaids Other (See Comments)    Pt cannot take due to Gastric Bypass Surgery   Amoxicillin Itching    Can take cephalexin. Did it involve swelling of the face/tongue/throat, SOB, or low BP? No Did it involve sudden or severe rash/hives, skin peeling, or any reaction on the inside of your mouth or nose? No Did you need to seek medical attention at a hospital or doctor's office? No When did it last happen?  Several years     If all above answers are "NO", may proceed with cephalosporin use.     Family History: Family History  Problem Relation Age of Onset   Breast cancer Paternal Aunt        pat great aunt   Heart disease Maternal Grandmother    Heart disease Maternal Grandfather    Heart disease Paternal Grandmother    Heart disease Paternal Grandfather    Anxiety disorder Mother    Depression Mother    Miscarriages / [place] Sister     Social History:  reports that she has never smoked. She has never used smokeless tobacco. She reports that she does not currently use alcohol. She reports that she does not use drugs.   Physical Exam: BP 125/73   Pulse (!) 109   Ht 5\' 8"  (1.727 m)   Wt 300 lb (136.1 kg)   LMP  (LMP Unknown)   BMI 45.61 kg/m   Constitutional:  Alert and oriented, No acute distress. HEENT: [place] AT, moist mucus membranes.  Trachea midline, no masses. Cardiovascular: No clubbing, cyanosis, or edema. Respiratory: Normal respiratory effort, no increased work of breathing. Psychiatric: Normal mood and affect.  Laboratory Data:  Urinalysis Dipstick/microscopy negative   Assessment & Plan:     1.  Overactive bladder with urge incontinence Urinalysis today clear Management options were discussed including medical management and pelvic floor physical therapy She was initially interested in medical therapy and given samples Gemtesa 75 mg daily Follow-up 1 month for symptom reassessment.  Pelvic exam if symptoms not improved  2.  Stress incontinence Mild We discussed that pelvic floor physical therapy would also improve the symptoms   , MD  [facility] [address], [address] [place], [place] [phone]

## 2021-03-30 LAB — URINALYSIS, COMPLETE
Bilirubin, UA: NEGATIVE
Leukocytes,UA: NEGATIVE
Nitrite, UA: NEGATIVE
Protein,UA: NEGATIVE
RBC, UA: NEGATIVE
Specific Gravity, UA: >1.03 — ABNORMAL HIGH (ref 1.005–1.030)
Urobilinogen, Ur: 0.2 mg/dL (ref 0.2–1.0)
pH, UA: 5.5 (ref 5.0–7.5)

## 2021-03-30 LAB — MICROSCOPIC EXAMINATION: Bacteria, UA: NONE SEEN

## 2021-04-01 ENCOUNTER — Encounter: Payer: Self-pay | Admitting: Urology

## 2021-04-01 DIAGNOSIS — N3281 Overactive bladder: Secondary | ICD-10-CM | POA: Insufficient documentation

## 2021-04-01 DIAGNOSIS — N393 Stress incontinence (female) (male): Secondary | ICD-10-CM | POA: Insufficient documentation

## 2021-04-01 DIAGNOSIS — N3941 Urge incontinence: Secondary | ICD-10-CM | POA: Insufficient documentation

## 2021-04-03 ENCOUNTER — Encounter: Payer: Self-pay | Admitting: Urology

## 2021-04-03 NOTE — Progress Notes (Signed)
04/04/2021 2:11 PM   Kimberly Santiago 06/28/1976 [id number]  Referring provider: Dorcas Carrow, DO [address] [place],  [place] [address]  Chief Complaint  Patient presents with   Urinary Frequency    Urological history: 1. Incontinence -contributing factors of age, sleep apnea, anxiety and depression -PVR 184 mL -managed with incontinence pads  HPI: Kimberly Santiago is a 45 y.o. female who presents today for a one month follow up after a trial of Gemtesa 75 mg daily.  UA benign  PVR 184 mL  She has taken 5 tablets of the Gemtesa 75 mg daily when she developed frequency (going every 3-5 times an hour), urgency, vaginal burning, lower back pain and lower abdominal pain.  She also states that something that happened last week as she went to have a bowel movement and all of a sudden felt very nauseous and felt like she was almost going to throw up.  She states the bowel movement was a little loose at that time.  She took an over-the-counter urine dip test which showed positive leukocytes but negative nitrites.  Patient denies any modifying or aggravating factors.  Patient denies any gross hematuria, dysuria or suprapubic/flank pain.  Patient denies any fevers, chills, nausea or vomiting.    PMH: Past Medical History:  Diagnosis Date   Anemia    Asthma    Barrett esophagus    Complication of anesthesia    Pt stopped breathing during EGD but did well with recent Gastric Bypass Surgery   Depression    GERD (gastroesophageal reflux disease)    H/O   Headache    H/O MIGRAINES   History of hiatal hernia    SMALL   PCOS (polycystic ovarian syndrome)    PONV (postoperative nausea and vomiting)    Sleep apnea    (mild) PT IS NOT USING CPAP   Status post repeat low transverse cesarean section 07/27/2019    Surgical History: Past Surgical History:  Procedure Laterality Date   CESAREAN SECTION N/A 08/18/2017   Procedure: Primary CESAREAN SECTION;  Surgeon: Noland Fordyce, MD;   Location: [facility]TH[facility] SU[facility];  Service: Obstetrics;  Laterality: N/A;  EDD: 09/13/17 Allergy: Amoxicillin, Morphine, Hydrocodone   CESAREAN SECTION WITH BILATERAL TUBAL LIGATION Bilateral 07/27/2019   Procedure: Repeat CESAREAN SECTION WITH BILATERAL TUBAL LIGATION;  Surgeon: Noland Fordyce, MD;  Location: [facility];  Service: Obstetrics;  Laterality: Bilateral;  EDD: 07/31/19   CHOLECYSTECTOMY     COLONOSCOPY WITH PROPOFOL N/A 07/07/2020   Procedure: COLONOSCOPY WITH PROPOFOL;  Surgeon: Midge Minium, MD;  Location: [facility];  Service: Endoscopy;  Laterality: N/A;   DIAGNOSTIC LAPAROSCOPY     EAR CYST EXCISION N/A 11/24/2014   Procedure: CYST REMOVAL;  Surgeon: Bud Face, MD;  Location: [facility];  Service: ENT;  Laterality: N/A;  upper lip   ESOPHAGOGASTRODUODENOSCOPY     ESOPHAGOGASTRODUODENOSCOPY (EGD) WITH PROPOFOL N/A 07/07/2020   Procedure: ESOPHAGOGASTRODUODENOSCOPY (EGD) WITH PROPOFOL;  Surgeon: Midge Minium, MD;  Location: [facility];  Service: Endoscopy;  Laterality: N/A;  sleep apnea   ETHMOIDECTOMY Right 05/07/2018   Procedure: ETHMOIDECTOMY;  Surgeon: Bud Face, MD;  Location: [facility] OR[facility];  Service: ENT;  Laterality: Right;   FRONTAL SINUS EXPLORATION Right 05/07/2018   Procedure: FRONTAL SINUS EXPLORATION;  Surgeon: Bud Face, MD;  Location: [facility] OR[facility];  Service: ENT;  Laterality: Right;   GASTRIC BYPASS  March 2016   IMAGE GUIDED SINUS SURGERY N/A 05/07/2018   Procedure: IMAGE GUIDED SINUS  SURGERY;  Surgeon: Bud Face, MD;  Location: [facility] OR[facility];  Service: ENT;  Laterality: N/A;   KNEE ARTHROSCOPY Right    MAXILLARY ANTROSTOMY Bilateral 05/07/2018   Procedure: MAXILLARY ANTROSTOMY;  Surgeon: Bud Face, MD;  Location: [facility];  Service: ENT;  Laterality: Bilateral;   NASAL SEPTOPLASTY W/ TURBINOPLASTY Bilateral 05/07/2018   Procedure: NASAL SEPTOPLASTY WITH TURBINATE REDUCTION;  Surgeon: Bud Face, MD;  Location: [facility] OR[facility];  Service:  ENT;  Laterality: Bilateral;   SPHENOIDECTOMY Right 05/07/2018   Procedure: Selina Cooley;  Surgeon: Bud Face, MD;  Location: [facility] OR[facility];  Service: ENT;  Laterality: Right;   TEMPOROMANDIBULAR JOINT SURGERY     WISDOM TOOTH EXTRACTION      Home Medications:  Allergies as of 04/04/2021       Reactions   Hydromorphone Shortness Of Breath, Nausea And Vomiting, Other (See Comments)   Chest pain   Hydrocodone Nausea And Vomiting, Other (See Comments)   Headache, Patient can take oxycodone.   Morphine And Related Nausea And Vomiting, Other (See Comments)   Headache   Nsaids Other (See Comments)   Pt cannot take due to Gastric Bypass Surgery   Amoxicillin Itching   Can take cephalexin. Did it involve swelling of the face/tongue/throat, SOB, or low BP? No Did it involve sudden or severe rash/hives, skin peeling, or any reaction on the inside of your mouth or nose? No Did you need to seek medical attention at a hospital or doctor's office? No When did it last happen?  Several years     If all above answers are "NO", may proceed with cephalosporin use.        Medication List        Accurate as of April 04, 2021 11:59 PM. If you have any questions, ask your nurse or doctor.          STOP taking these medications    Gemtesa 75 MG Tabs Generic drug: Vibegron Stopped by: Michiel Cowboy, PA-C       TAKE these medications    acetaminophen 325 MG tablet Commonly known as: TYLENOL Take 650 mg by mouth every 6 (six) hours as needed for mild pain or headache.   busPIRone 7.5 MG tablet Commonly known as: BUSPAR Take 7.5 mg by mouth at bedtime.   citalopram 40 MG tablet Commonly known as: CELEXA TAKE 1 TABLET BY MOUTH EVERYDAY AT BEDTIME   famotidine 20 MG tablet Commonly known as: PEPCID Take 1 tablet (20 mg total) by mouth at bedtime.   fesoterodine 8 MG Tb24 tablet Commonly known as: Toviaz Take 1 tablet (8 mg total) by mouth daily. Started by: Michiel Cowboy, PA-C   fexofenadine 180 MG tablet Commonly known as: ALLEGRA Take 180 mg by mouth daily.   Junel 1/20 1-20 MG-MCG tablet Generic drug: norethindrone-ethinyl estradiol Take 1 tablet by mouth daily.   magic mouthwash (lidocaine, diphenhydrAMINE, alum & mag hydroxide) suspension Swish and swallow 5 mLs 3 (three) times daily as needed for mouth pain.   nystatin powder Commonly known as: MYCOSTATIN/NYSTOP Apply 1 application topically 3 (three) times daily.   pantoprazole 40 MG tablet Commonly known as: PROTONIX TAKE 1 TABLET BY MOUTH TWICE A DAY   scopolamine 1 MG/3DAYS Commonly known as: Transderm-Scop (1.5 MG) Place 1 patch (1.5 mg total) onto the skin every 3 (three) days.        Allergies:  Allergies  Allergen Reactions   Hydromorphone Shortness Of Breath, Nausea And Vomiting and Other (See Comments)  Chest pain   Hydrocodone Nausea And Vomiting and Other (See Comments)    Headache, Patient can take oxycodone.   Morphine And Related Nausea And Vomiting and Other (See Comments)    Headache   Nsaids Other (See Comments)    Pt cannot take due to Gastric Bypass Surgery   Amoxicillin Itching    Can take cephalexin. Did it involve swelling of the face/tongue/throat, SOB, or low BP? No Did it involve sudden or severe rash/hives, skin peeling, or any reaction on the inside of your mouth or nose? No Did you need to seek medical attention at a hospital or doctor's office? No When did it last happen?  Several years     If all above answers are "NO", may proceed with cephalosporin use.     Family History: Family History  Problem Relation Age of Onset   Breast cancer Paternal Aunt        pat great aunt   Heart disease Maternal Grandmother    Heart disease Maternal Grandfather    Heart disease Paternal Grandmother    Heart disease Paternal Grandfather    Anxiety disorder Mother    Depression Mother    Miscarriages / [place] Sister     Social History:   reports that she has never smoked. She has never used smokeless tobacco. She reports that she does not currently use alcohol. She reports that she does not use drugs.   Physical Exam: BP 100/64   Pulse 86   Ht 5\' 8"  (1.727 m)   Wt 300 lb (136.1 kg)   LMP  (LMP Unknown)   BMI 45.61 kg/m   Constitutional:  Well nourished. Alert and oriented, No acute distress. HEENT: [place] AT, mask in place.  Trachea midline Cardiovascular: No clubbing, cyanosis, or edema. Respiratory: Normal respiratory effort, no increased work of breathing. GU: No CVA tenderness.  No bladder fullness or masses.  Normal external genitalia with some occluded sebaceous cysts in the labia minora, normal pubic hair distribution, no lesions.  Labia minora are tender to palpation.  Normal urethral meatus, no lesions, no prolapse, no discharge.   No urethral masses, tenderness and/or tenderness. No bladder fullness, tenderness or masses.  Erythremic vagina mucosa, normal estrogen effect, no discharge, no lesions, fair pelvic support, grade I cystocele and no rectocele noted.  Anus and perineum are without rashes or lesions.     Neurologic: Grossly intact, no focal deficits, moving all 4 extremities. Psychiatric: Normal mood and affect.    Laboratory Data: Urinalysis Component     Latest Ref Rng & Units 04/04/2021  Specific Gravity, UA     1.005 - 1.030 1.020  pH, UA     5.0 - 7.5 6.5  Color, UA     Yellow Yellow  Appearance Ur     Clear Clear  Leukocytes,UA     Negative Negative  Protein,UA     Negative/Trace Negative  Glucose, UA     Negative Negative  Ketones, UA     Negative Negative  RBC, UA     Negative Negative  Bilirubin, UA     Negative Negative  Urobilinogen, Ur     0.2 - 1.0 mg/dL 0.2  Nitrite, UA     Negative Negative  Microscopic Examination      See below:   Component     Latest Ref Rng & Units 04/04/2021          WBC, UA     0 - 5 /hpf 0-5  RBC     0 - 2 /hpf 0-2  Epithelial Cells (non  renal)     0 - 10 /hpf 0-10  Bacteria, UA     None seen/Few Few   I have reviewed the labs.   Pertinent Imaging Results for TURNER, KUNZMAN (MRN [id number]) as of 04/04/2021 11:12  Ref. Range 04/04/2021 11:04  Scan Result Unknown    Assessment & Plan:    1.  Overactive bladder with urge incontinence -UA benign -PVR moderate -Discussed trying a different overactive bladder medication, Toviaz 8 mg daily, to see if this will get better control over her urge incontinence-advised of the side effects  2. Stress incontinence -Offered referral to pelvic floor physical therapy, she defers at this time as she does not want to miss a lot of work  3. Vaginal burning -Advised to speak with gynecologist regarding symptom relief of her vaginal burning  4.  Occluded sebaceous cyst -Advised to speak with gynecologist or dermatologist regarding treatment of the cysts as they may be irritating  Michiel Cowboy, [facility] [address], [address] [place], [place] [address] [phone]

## 2021-04-04 ENCOUNTER — Other Ambulatory Visit: Payer: Self-pay

## 2021-04-04 ENCOUNTER — Ambulatory Visit (INDEPENDENT_AMBULATORY_CARE_PROVIDER_SITE_OTHER): Payer: BC Managed Care – PPO | Admitting: Urology

## 2021-04-04 ENCOUNTER — Encounter: Payer: Self-pay | Admitting: Urology

## 2021-04-04 VITALS — BP 100/64 | HR 86 | Ht 68.0 in | Wt 300.0 lb

## 2021-04-04 DIAGNOSIS — N3941 Urge incontinence: Secondary | ICD-10-CM | POA: Diagnosis not present

## 2021-04-04 DIAGNOSIS — L723 Sebaceous cyst: Secondary | ICD-10-CM | POA: Diagnosis not present

## 2021-04-04 DIAGNOSIS — N949 Unspecified condition associated with female genital organs and menstrual cycle: Secondary | ICD-10-CM | POA: Diagnosis not present

## 2021-04-04 DIAGNOSIS — N3281 Overactive bladder: Secondary | ICD-10-CM

## 2021-04-04 LAB — BLADDER SCAN AMB NON-IMAGING

## 2021-04-04 MED ORDER — FESOTERODINE FUMARATE ER 8 MG PO TB24
8.0000 mg | ORAL_TABLET | Freq: Every day | ORAL | 0 refills | Status: DC
Start: 2021-04-04 — End: 2021-05-03

## 2021-04-05 LAB — URINALYSIS, COMPLETE
Bilirubin, UA: NEGATIVE
Glucose, UA: NEGATIVE
Ketones, UA: NEGATIVE
Leukocytes,UA: NEGATIVE
Nitrite, UA: NEGATIVE
Protein,UA: NEGATIVE
RBC, UA: NEGATIVE
Specific Gravity, UA: 1.02 (ref 1.005–1.030)
Urobilinogen, Ur: 0.2 mg/dL (ref 0.2–1.0)
pH, UA: 6.5 (ref 5.0–7.5)

## 2021-04-05 LAB — MICROSCOPIC EXAMINATION

## 2021-04-30 DIAGNOSIS — E1159 Type 2 diabetes mellitus with other circulatory complications: Secondary | ICD-10-CM | POA: Diagnosis not present

## 2021-04-30 DIAGNOSIS — E1169 Type 2 diabetes mellitus with other specified complication: Secondary | ICD-10-CM | POA: Diagnosis not present

## 2021-04-30 DIAGNOSIS — Z0000 Encounter for general adult medical examination without abnormal findings: Secondary | ICD-10-CM | POA: Diagnosis not present

## 2021-04-30 DIAGNOSIS — Z23 Encounter for immunization: Secondary | ICD-10-CM | POA: Diagnosis not present

## 2021-04-30 DIAGNOSIS — I152 Hypertension secondary to endocrine disorders: Secondary | ICD-10-CM | POA: Diagnosis not present

## 2021-05-02 ENCOUNTER — Ambulatory Visit: Payer: BC Managed Care – PPO | Admitting: Urology

## 2021-05-02 NOTE — Progress Notes (Signed)
05/03/2021 5:37 PM   Kimberly Santiago 28-Oct-1975 [id number]  Referring provider: Valerie Roys, DO [place],  [place] [address]  Chief Complaint  Patient presents with   Urinary Incontinence    Urological history: 1. Incontinence -contributing factors of age, sleep apnea, anxiety and depression -PVR 477 mL -managed with incontinence pads  HPI: Kimberly Santiago is a 45 y.o. female who presents today for a one month follow up after a trial of Toviaz 8 mg daily.  She discontinued her Toviaz two weeks ago due to intense dry mouth.    She is experiencing 8 or more daytime voids, 3 or more night time voids and a strong urge to urinate.  She experiences incontinence with stress and urge.  She is leaking 3 or more times daily, wearing absorbant pads daily and engages in toilet mapping.  Patient denies any modifying or aggravating factors.  Patient denies any gross hematuria, dysuria or suprapubic/flank pain.  Patient denies any fevers, chills, nausea or vomiting.     PMH: Past Medical History:  Diagnosis Date   Anemia    Asthma    Barrett esophagus    Complication of anesthesia    Pt stopped breathing during EGD but did well with recent Gastric Bypass Surgery   Depression    GERD (gastroesophageal reflux disease)    H/O   Headache    H/O MIGRAINES   History of hiatal hernia    SMALL   PCOS (polycystic ovarian syndrome)    PONV (postoperative nausea and vomiting)    Sleep apnea    (mild) PT IS NOT USING CPAP   Status post repeat low transverse cesarean section 07/27/2019    Surgical History: Past Surgical History:  Procedure Laterality Date   CESAREAN SECTION N/A 08/18/2017   Procedure: Primary CESAREAN SECTION;  Surgeon: Aloha Gell, MD;  Location: [facility];  Service: Obstetrics;  Laterality: N/A;  EDD: 09/13/17 Allergy: Amoxicillin, Morphine, Hydrocodone   CESAREAN SECTION WITH BILATERAL TUBAL LIGATION Bilateral 07/27/2019   Procedure: Repeat  CESAREAN SECTION WITH BILATERAL TUBAL LIGATION;  Surgeon: Aloha Gell, MD;  Location: [facility];  Service: Obstetrics;  Laterality: Bilateral;  EDD: 07/31/19   CHOLECYSTECTOMY     COLONOSCOPY WITH PROPOFOL N/A 07/07/2020   Procedure: COLONOSCOPY WITH PROPOFOL;  Surgeon: Lucilla Lame, MD;  Location: [facility];  Service: Endoscopy;  Laterality: N/A;   DIAGNOSTIC LAPAROSCOPY     EAR CYST EXCISION N/A 11/24/2014   Procedure: CYST REMOVAL;  Surgeon: Carloyn Manner, MD;  Location: [facility];  Service: ENT;  Laterality: N/A;  upper lip   ESOPHAGOGASTRODUODENOSCOPY     ESOPHAGOGASTRODUODENOSCOPY (EGD) WITH PROPOFOL N/A 07/07/2020   Procedure: ESOPHAGOGASTRODUODENOSCOPY (EGD) WITH PROPOFOL;  Surgeon: Lucilla Lame, MD;  Location: [facility];  Service: Endoscopy;  Laterality: N/A;  sleep apnea   ETHMOIDECTOMY Right 05/07/2018   Procedure: ETHMOIDECTOMY;  Surgeon: Carloyn Manner, MD;  Location: [facility];  Service: ENT;  Laterality: Right;   FRONTAL SINUS EXPLORATION Right 05/07/2018   Procedure: FRONTAL SINUS EXPLORATION;  Surgeon: Carloyn Manner, MD;  Location: [facility];  Service: ENT;  Laterality: Right;   GASTRIC BYPASS  March 2016   IMAGE GUIDED SINUS SURGERY N/A 05/07/2018   Procedure: IMAGE GUIDED SINUS SURGERY;  Surgeon: Carloyn Manner, MD;  Location: [facility];  Service: ENT;  Laterality: N/A;   KNEE ARTHROSCOPY Right    MAXILLARY ANTROSTOMY Bilateral 05/07/2018   Procedure: MAXILLARY ANTROSTOMY;  Surgeon: Carloyn Manner, MD;  Location:  [facility];  Service: ENT;  Laterality: Bilateral;   NASAL SEPTOPLASTY W/ TURBINOPLASTY Bilateral 05/07/2018   Procedure: NASAL SEPTOPLASTY WITH TURBINATE REDUCTION;  Surgeon: Bud Face, MD;  Location: [facility];  Service: ENT;  Laterality: Bilateral;   SPHENOIDECTOMY Right 05/07/2018   Procedure: Selina Cooley;  Surgeon: Bud Face, MD;  Location: [facility];  Service: ENT;  Laterality: Right;   TEMPOROMANDIBULAR JOINT SURGERY      WISDOM TOOTH EXTRACTION      Home Medications:  Allergies as of 05/03/2021       Reactions   Hydromorphone Shortness Of Breath, Nausea And Vomiting, Other (See Comments)   Chest pain   Hydrocodone Nausea And Vomiting, Other (See Comments)   Headache, Patient can take oxycodone.   Morphine And Related Nausea And Vomiting, Other (See Comments)   Headache   Nsaids Other (See Comments)   Pt cannot take due to Gastric Bypass Surgery   Amoxicillin Itching   Can take cephalexin. Did it involve swelling of the face/tongue/throat, SOB, or low BP? No Did it involve sudden or severe rash/hives, skin peeling, or any reaction on the inside of your mouth or nose? No Did you need to seek medical attention at a hospital or doctor's office? No When did it last happen?  Several years     If all above answers are "NO", may proceed with cephalosporin use.        Medication List        Accurate as of May 03, 2021 11:59 PM. If you have any questions, ask your nurse or doctor.          STOP taking these medications    fesoterodine 8 MG Tb24 tablet Commonly known as: Toviaz Stopped by: Michiel Cowboy, PA-C       TAKE these medications    acetaminophen 325 MG tablet Commonly known as: TYLENOL Take 650 mg by mouth every 6 (six) hours as needed for mild pain or headache.   busPIRone 7.5 MG tablet Commonly known as: BUSPAR Take 7.5 mg by mouth at bedtime.   citalopram 40 MG tablet Commonly known as: CELEXA TAKE 1 TABLET BY MOUTH EVERYDAY AT BEDTIME   famotidine 20 MG tablet Commonly known as: PEPCID Take 1 tablet (20 mg total) by mouth at bedtime.   fexofenadine 180 MG tablet Commonly known as: ALLEGRA Take 180 mg by mouth daily.   Junel 1/20 1-20 MG-MCG tablet Generic drug: norethindrone-ethinyl estradiol Take 1 tablet by mouth daily.   magic mouthwash (lidocaine, diphenhydrAMINE, alum & mag hydroxide) suspension Swish and swallow 5 mLs 3 (three) times daily as  needed for mouth pain.   nystatin powder Commonly known as: MYCOSTATIN/NYSTOP Apply 1 application topically 3 (three) times daily.   pantoprazole 40 MG tablet Commonly known as: PROTONIX TAKE 1 TABLET BY MOUTH TWICE A DAY   scopolamine 1 MG/3DAYS Commonly known as: Transderm-Scop (1.5 MG) Place 1 patch (1.5 mg total) onto the skin every 3 (three) days.        Allergies:  Allergies  Allergen Reactions   Hydromorphone Shortness Of Breath, Nausea And Vomiting and Other (See Comments)    Chest pain   Hydrocodone Nausea And Vomiting and Other (See Comments)    Headache, Patient can take oxycodone.   Morphine And Related Nausea And Vomiting and Other (See Comments)    Headache   Nsaids Other (See Comments)    Pt cannot take due to Gastric Bypass Surgery   Amoxicillin Itching    Can take cephalexin.  Did it involve swelling of the face/tongue/throat, SOB, or low BP? No Did it involve sudden or severe rash/hives, skin peeling, or any reaction on the inside of your mouth or nose? No Did you need to seek medical attention at a hospital or doctor's office? No When did it last happen?  Several years     If all above answers are "NO", may proceed with cephalosporin use.     Family History: Family History  Problem Relation Age of Onset   Breast cancer Paternal Aunt        pat great aunt   Heart disease Maternal Grandmother    Heart disease Maternal Grandfather    Heart disease Paternal Grandmother    Heart disease Paternal Grandfather    Anxiety disorder Mother    Depression Mother    Miscarriages / [place] Sister     Social History:  reports that she has never smoked. She has never used smokeless tobacco. She reports that she does not currently use alcohol. She reports that she does not use drugs.   Physical Exam: BP (!) 148/79   Pulse 88   Ht 5\' 8"  (1.727 m)   Wt 300 lb (136.1 kg)   BMI 45.61 kg/m   Constitutional:  Well nourished. Alert and oriented, No acute  distress. HEENT: [place] AT, mask in place.  Trachea midline Cardiovascular: No clubbing, cyanosis, or edema. Respiratory: Normal respiratory effort, no increased work of breathing. Neurologic: Grossly intact, no focal deficits, moving all 4 extremities. Psychiatric: Normal mood and affect.    Laboratory Data: N/A  I have reviewed the labs.   Pertinent Imaging Results for Kimberly Santiago, Kimberly Santiago (MRN [id number]) as of 05/03/2021 13:15  Ref. Range 05/03/2021 13:11  Scan Result Unknown [id number]     Assessment & Plan:    1.  Overactive bladder with urge incontinence -discontinue OAB meds -discussed that as she is experiencing both stress and urge incontinence her choices would be to pursue a pessary placement, physical therapy, PTNS, UDS to further investigate her urinary symptoms or have an appointment with Dr. Matilde Sprang for further discussion -she would like an appointment with Dr. Matilde Sprang -will also check BMP to evaluate for renal function secondary to elevated PVR   2. Stress incontinence -see #1   Zara Council, PA-C  [facility] [address] [place],  [address] [phone]  I spent 25 minutes on the day of the encounter to include pre-visit record review, face-to-face time with the patient, and post-visit ordering of tests.

## 2021-05-03 ENCOUNTER — Encounter: Payer: Self-pay | Admitting: Urology

## 2021-05-03 ENCOUNTER — Ambulatory Visit (INDEPENDENT_AMBULATORY_CARE_PROVIDER_SITE_OTHER): Payer: BC Managed Care – PPO | Admitting: Urology

## 2021-05-03 ENCOUNTER — Other Ambulatory Visit: Payer: Self-pay

## 2021-05-03 VITALS — BP 148/79 | HR 88 | Ht 68.0 in | Wt 300.0 lb

## 2021-05-03 DIAGNOSIS — N393 Stress incontinence (female) (male): Secondary | ICD-10-CM

## 2021-05-03 DIAGNOSIS — N3941 Urge incontinence: Secondary | ICD-10-CM

## 2021-05-03 DIAGNOSIS — N3281 Overactive bladder: Secondary | ICD-10-CM | POA: Diagnosis not present

## 2021-05-03 LAB — BLADDER SCAN AMB NON-IMAGING: Scan Result: 477

## 2021-05-04 LAB — BASIC METABOLIC PANEL
BUN/Creatinine Ratio: 10 (ref 9–23)
BUN: 8 mg/dL (ref 6–24)
CO2: 25 mmol/L (ref 20–29)
Calcium: 8.9 mg/dL (ref 8.7–10.2)
Chloride: 103 mmol/L (ref 96–106)
Creatinine, Ser: 0.83 mg/dL (ref 0.57–1.00)
Glucose: 79 mg/dL (ref 70–99)
Potassium: 4.6 mmol/L (ref 3.5–5.2)
Sodium: 140 mmol/L (ref 134–144)
eGFR: 89 mL/min/{1.73_m2} (ref >59)

## 2021-05-14 ENCOUNTER — Other Ambulatory Visit: Payer: Self-pay

## 2021-05-14 ENCOUNTER — Encounter: Payer: Self-pay | Admitting: Urology

## 2021-05-14 ENCOUNTER — Ambulatory Visit (INDEPENDENT_AMBULATORY_CARE_PROVIDER_SITE_OTHER): Payer: BC Managed Care – PPO | Admitting: Urology

## 2021-05-14 VITALS — BP 146/82 | HR 90

## 2021-05-14 DIAGNOSIS — N3281 Overactive bladder: Secondary | ICD-10-CM

## 2021-05-14 LAB — BLADDER SCAN AMB NON-IMAGING: Scan Result: 420

## 2021-05-14 NOTE — Progress Notes (Signed)
05/14/2021 3:17 PM   Kimberly Santiago 02-10-1976 [id number]  Referring provider: Dorcas Carrow, DO [address] [place],  [place] [address]  No chief complaint on file.   HPI: Kimberly Santiago: Has been on Toviaz but stopped due to dry mouth.  Residual 477 mL.  Sleep apnea.  For years patient leaks with coughing sneezing bending lifting.  Her urgency incontinence is worsening lately.  She can void 4 times an hour.  She has difficulty holding it for 1 hour and cannot hold it for 2 hours.  She goes because of her urgency pressure and fullness and not due to pain.  She wears 4 pads a day or more moderately wet.  She wears a pad at night she thinks it a bit wet.  Some of the details were more challenging  She gets at least 3 times a night.  Has ankle edema but does not take a diuretic  She is failed [place].  No hysterectomy  No history of bladder surgery kidney stones or bladder infections.  Bowel movements normal.  No neurologic issues  On pelvic examination she had a very well supported bladder neck.  No stress incontinence prolapse.  She states she tends to be anxious but did very well  She was bladder scanned today for 420 mL    PMH: Past Medical History:  Diagnosis Date   Anemia    Asthma    Barrett esophagus    Complication of anesthesia    Pt stopped breathing during EGD but did well with recent Gastric Bypass Surgery   Depression    GERD (gastroesophageal reflux disease)    H/O   Headache    H/O MIGRAINES   History of hiatal hernia    SMALL   PCOS (polycystic ovarian syndrome)    PONV (postoperative nausea and vomiting)    Sleep apnea    (mild) PT IS NOT USING CPAP   Status post repeat low transverse cesarean section 07/27/2019    Surgical History: Past Surgical History:  Procedure Laterality Date   CESAREAN SECTION N/A 08/18/2017   Procedure: Primary CESAREAN SECTION;  Surgeon: Noland Fordyce, MD;  Location: [facility];  Service: Obstetrics;   Laterality: N/A;  EDD: 09/13/17 Allergy: Amoxicillin, Morphine, Hydrocodone   CESAREAN SECTION WITH BILATERAL TUBAL LIGATION Bilateral 07/27/2019   Procedure: Repeat CESAREAN SECTION WITH BILATERAL TUBAL LIGATION;  Surgeon: Noland Fordyce, MD;  [facility];  Service: Obstetrics;  Laterality: Bilateral;  EDD: 07/31/19   CHOLECYSTECTOMY     COLONOSCOPY WITH PROPOFOL N/A 07/07/2020   Procedure: COLONOSCOPY WITH PROPOFOL;  Surgeon: Midge Minium, MD;  Location: [facility];  Service: Endoscopy;  Laterality: N/A;   DIAGNOSTIC LAPAROSCOPY     EAR CYST EXCISION N/A 11/24/2014   Procedure: CYST REMOVAL;  Surgeon: Bud Face, MD;  Location: [facility];  Service: ENT;  Laterality: N/A;  upper lip   ESOPHAGOGASTRODUODENOSCOPY     ESOPHAGOGASTRODUODENOSCOPY (EGD) WITH PROPOFOL N/A 07/07/2020   Procedure: ESOPHAGOGASTRODUODENOSCOPY (EGD) WITH PROPOFOL;  Surgeon: Midge Minium, MD;  Location: [facility];  Service: Endoscopy;  Laterality: N/A;  sleep apnea   ETHMOIDECTOMY Right 05/07/2018   Procedure: ETHMOIDECTOMY;  Surgeon: Bud Face, MD;  Location: [facility];  Service: ENT;  Laterality: Right;   FRONTAL SINUS EXPLORATION Right 05/07/2018   Procedure: FRONTAL SINUS EXPLORATION;  Surgeon: Bud Face, MD;  Location: [facility];  Service: ENT;  Laterality: Right;   GASTRIC BYPASS  March 2016   IMAGE GUIDED  SINUS SURGERY N/A 05/07/2018   Procedure: IMAGE GUIDED SINUS SURGERY;  Surgeon: Bud Face, MD;  Location: [facility];  Service: ENT;  Laterality: N/A;   KNEE ARTHROSCOPY Right    MAXILLARY ANTROSTOMY Bilateral 05/07/2018   Procedure: MAXILLARY ANTROSTOMY;  Surgeon: Bud Face, MD;  Location: [facility];  Service: ENT;  Laterality: Bilateral;   NASAL SEPTOPLASTY W/ TURBINOPLASTY Bilateral 05/07/2018   Procedure: NASAL SEPTOPLASTY WITH TURBINATE REDUCTION;  Surgeon: Bud Face, MD;  Location: [facility];  Service: ENT;  Laterality: Bilateral;   SPHENOIDECTOMY Right  05/07/2018   Procedure: Selina Cooley;  Surgeon: Bud Face, MD;  Location: [facility];  Service: ENT;  Laterality: Right;   TEMPOROMANDIBULAR JOINT SURGERY     WISDOM TOOTH EXTRACTION      Home Medications:  Allergies as of 05/14/2021       Reactions   Hydromorphone Shortness Of Breath, Nausea And Vomiting, Other (See Comments)   Chest pain   Hydrocodone Nausea And Vomiting, Other (See Comments)   Headache, Patient can take oxycodone.   Morphine And Related Nausea And Vomiting, Other (See Comments)   Headache   Nsaids Other (See Comments)   Pt cannot take due to Gastric Bypass Surgery   Amoxicillin Itching   Can take cephalexin. Did it involve swelling of the face/tongue/throat, SOB, or low BP? No Did it involve sudden or severe rash/hives, skin peeling, or any reaction on the inside of your mouth or nose? No Did you need to seek medical attention at a hospital or doctor's office? No When did it last happen?  Several years     If all above answers are "NO", may proceed with cephalosporin use.        Medication List        Accurate as of May 14, 2021  3:17 PM. If you have any questions, ask your nurse or doctor.          acetaminophen 325 MG tablet Commonly known as: TYLENOL Take 650 mg by mouth every 6 (six) hours as needed for mild pain or headache.   busPIRone 7.5 MG tablet Commonly known as: BUSPAR Take 7.5 mg by mouth at bedtime.   citalopram 40 MG tablet Commonly known as: CELEXA TAKE 1 TABLET BY MOUTH EVERYDAY AT BEDTIME   famotidine 20 MG tablet Commonly known as: PEPCID Take 1 tablet (20 mg total) by mouth at bedtime.   fexofenadine 180 MG tablet Commonly known as: ALLEGRA Take 180 mg by mouth daily.   Junel 1/20 1-20 MG-MCG tablet Generic drug: norethindrone-ethinyl estradiol Take 1 tablet by mouth daily.   magic mouthwash (lidocaine, diphenhydrAMINE, alum & mag hydroxide) suspension Swish and swallow 5 mLs 3 (three) times daily as  needed for mouth pain.   nystatin powder Commonly known as: MYCOSTATIN/NYSTOP Apply 1 application topically 3 (three) times daily.   pantoprazole 40 MG tablet Commonly known as: PROTONIX TAKE 1 TABLET BY MOUTH TWICE A DAY   scopolamine 1 MG/3DAYS Commonly known as: Transderm-Scop (1.5 MG) Place 1 patch (1.5 mg total) onto the skin every 3 (three) days.        Allergies:  Allergies  Allergen Reactions   Hydromorphone Shortness Of Breath, Nausea And Vomiting and Other (See Comments)    Chest pain   Hydrocodone Nausea And Vomiting and Other (See Comments)    Headache, Patient can take oxycodone.   Morphine And Related Nausea And Vomiting and Other (See Comments)    Headache   Nsaids Other (See Comments)  Pt cannot take due to Gastric Bypass Surgery   Amoxicillin Itching    Can take cephalexin. Did it involve swelling of the face/tongue/throat, SOB, or low BP? No Did it involve sudden or severe rash/hives, skin peeling, or any reaction on the inside of your mouth or nose? No Did you need to seek medical attention at a hospital or doctor's office? No When did it last happen?  Several years     If all above answers are "NO", may proceed with cephalosporin use.     Family History: Family History  Problem Relation Age of Onset   Breast cancer Paternal Aunt        pat great aunt   Heart disease Maternal Grandmother    Heart disease Maternal Grandfather    Heart disease Paternal Grandmother    Heart disease Paternal Grandfather    Anxiety disorder Mother    Depression Mother    Miscarriages / [place] Sister     Social History:  reports that she has never smoked. She has never used smokeless tobacco. She reports that she does not currently use alcohol. She reports that she does not use drugs.  ROS:                                        Physical Exam: BP (!) 146/82   Pulse 90    Laboratory Data: Lab Results  Component Value Date    WBC 7.8 12/20/2020   HGB 13.2 12/20/2020   HCT 40.5 12/20/2020   MCV 84.4 12/20/2020   PLT 343 12/20/2020    Lab Results  Component Value Date   CREATININE 0.83 05/03/2021    No results found for: PSA  No results found for: TESTOSTERONE  Lab Results  Component Value Date   HGBA1C 4.8 03/09/2020    Urinalysis    Component Value Date/Time   COLORURINE YELLOW (A) 12/20/2020 1652   APPEARANCEUR Clear 04/04/2021 1110   LABSPEC 1.034 (H) 12/20/2020 1652   LABSPEC 1.023 01/03/2014 0019   PHURINE 5.0 12/20/2020 1652   GLUCOSEU Negative 04/04/2021 1110   GLUCOSEU Negative 01/03/2014 0019   HGBUR NEGATIVE 12/20/2020 1652   BILIRUBINUR Negative 04/04/2021 1110   BILIRUBINUR Negative 01/03/2014 0019   KETONESUR 5 (A) 12/20/2020 1652   KETONESUR trace (5) (A) 12/20/2020 1550   PROTEINUR Negative 04/04/2021 1110   PROTEINUR NEGATIVE 12/20/2020 1652   UROBILINOGEN 0.2 12/20/2020 1550   NITRITE Negative 04/04/2021 1110   NITRITE NEGATIVE 12/20/2020 1652   LEUKOCYTESUR Negative 04/04/2021 1110   LEUKOCYTESUR NEGATIVE 12/20/2020 1652   LEUKOCYTESUR Negative 01/03/2014 0019    Pertinent Imaging:   Assessment & Plan: The role of urodynamics was discussed for mixed incontinence and incomplete bladder emptying.  She may have an element of enuresis.  She has severe frequency. She has a very high residual which is out of the ordinary for her age.  Renal ultrasound ordered to rule out silent hydronephrosis.  Perform cystoscopy next visit  1. Overactive bladder   - CULTURE, URINE COMPREHENSIVE   No follow-ups on file.  Martina Sinner, MD  [facility] [address], [address] [place], [place] [address] [phone]

## 2021-05-14 NOTE — Patient Instructions (Signed)

## 2021-05-15 ENCOUNTER — Telehealth: Payer: Self-pay | Admitting: Urology

## 2021-05-15 NOTE — Telephone Encounter (Signed)
Patient has severe anxiety and wants her husband to come to her

## 2021-05-15 NOTE — Telephone Encounter (Signed)
Sw pt. Advised pt to bring husband. I will check with Dr. Sherron Monday .

## 2021-05-15 NOTE — Telephone Encounter (Signed)
Patient has a Cysto appt with Dr. Sherron Monday, and wants her husband with her, due to having severe anxiety.  Is that ok?

## 2021-05-17 ENCOUNTER — Telehealth: Payer: Self-pay

## 2021-05-17 LAB — CULTURE, URINE COMPREHENSIVE

## 2021-05-17 MED ORDER — CIPROFLOXACIN HCL 250 MG PO TABS: 250.0000 mg | ORAL_TABLET | Freq: Two times a day (BID) | ORAL | 0 refills | Status: AC

## 2021-05-17 NOTE — Telephone Encounter (Signed)
Medication sent to pharmacy. Pt aware and verbalized understanding

## 2021-05-17 NOTE — Telephone Encounter (Signed)
-----   Message from Alfredo Martinez, MD sent at 05/17/2021  9:00 AM EST ----- Cipro 250 mg bid for 7 days  ----- Message ----- From: Veneta Penton, CMA Sent: 05/17/2021   8:13 AM EST To: Alfredo Martinez, MD   ----- Message ----- From: Interface, [facility] Lab Results In Sent: 05/16/2021   7:37 AM EST To: Jennette Kettle Clinical

## 2021-05-18 ENCOUNTER — Other Ambulatory Visit: Payer: Self-pay

## 2021-05-18 ENCOUNTER — Other Ambulatory Visit: Payer: Self-pay | Admitting: Family Medicine

## 2021-05-18 DIAGNOSIS — N3941 Urge incontinence: Secondary | ICD-10-CM

## 2021-05-18 NOTE — Telephone Encounter (Signed)
Requested Prescriptions  Pending Prescriptions Disp Refills  . citalopram (CELEXA) 40 MG tablet [Pharmacy Med Name: CITALOPRAM HBR 40 MG TABLET] 30 tablet 0    Sig: TAKE 1 TABLET BY MOUTH EVERYDAY AT BEDTIME     Psychiatry:  Antidepressants - SSRI Passed - 05/18/2021  1:37 AM      Passed - Completed PHQ-2 or PHQ-9 in the last 360 days      Passed - Valid encounter within last 6 months    Recent Outpatient Visits          3 months ago Cough   [facility] [place], Megan P, DO   8 months ago Routine general medical examination at a health care facility   [facility] [place], [place], DO   1 year ago Chronic fatigue   [facility] [place], [place], DO   1 year ago Left hip pain   [facility] Particia [place], [place]   1 year ago Anxiety   [facility] Roosvelt Maser [place], [place]

## 2021-05-23 ENCOUNTER — Other Ambulatory Visit: Payer: Self-pay

## 2021-05-23 ENCOUNTER — Ambulatory Visit
Admission: RE | Admit: 2021-05-23 | Discharge: 2021-05-23 | Disposition: A | Discharge status: Home / Self Care | Payer: BC Managed Care – PPO | Source: Ambulatory Visit | Attending: Urology | Admitting: Urology

## 2021-05-23 DIAGNOSIS — N3281 Overactive bladder: Secondary | ICD-10-CM | POA: Insufficient documentation

## 2021-05-23 IMAGING — US US RENAL
1 series · 14 of 25 positions shown · non-contrast
Comparison: [DATE]

CLINICAL DATA: Overactive bladder

EXAM:
RENAL / URINARY TRACT ULTRASOUND COMPLETE

[Series 1: us renal · 0.26mm/px · 14 of 33 slices shown]
[im 1/33]
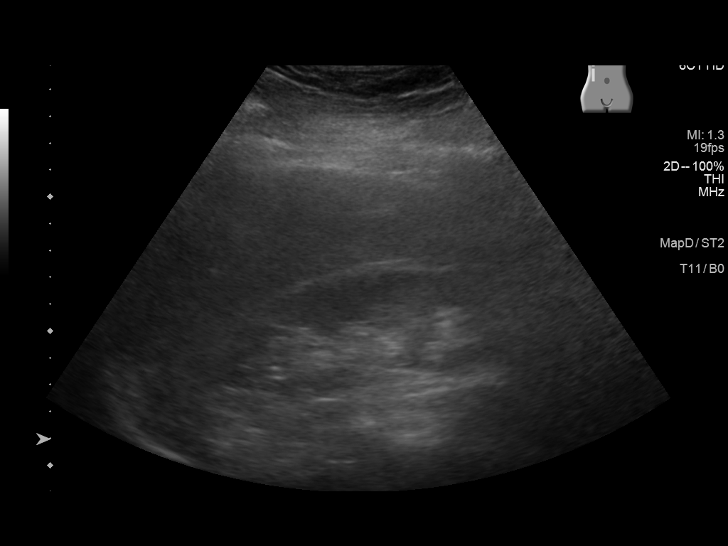
[im 3/33]
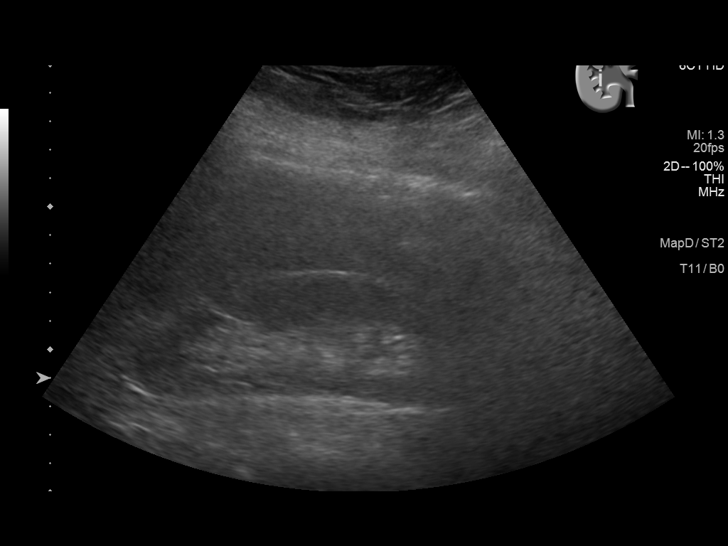
[im 6/33]
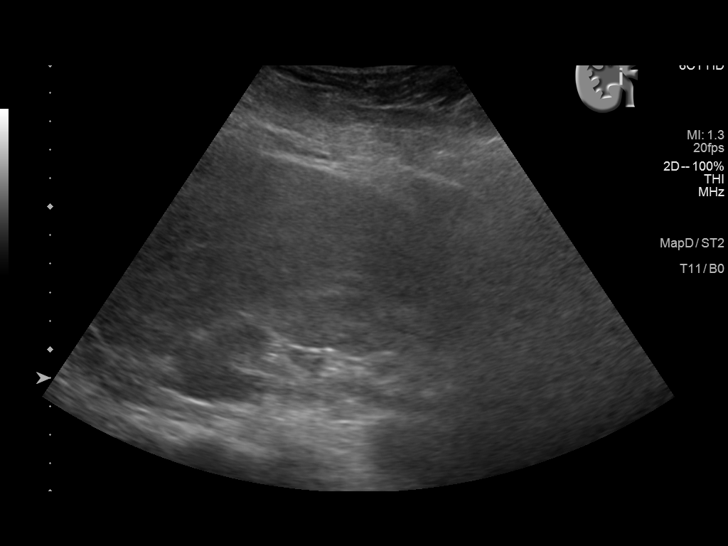
[im 9/33]
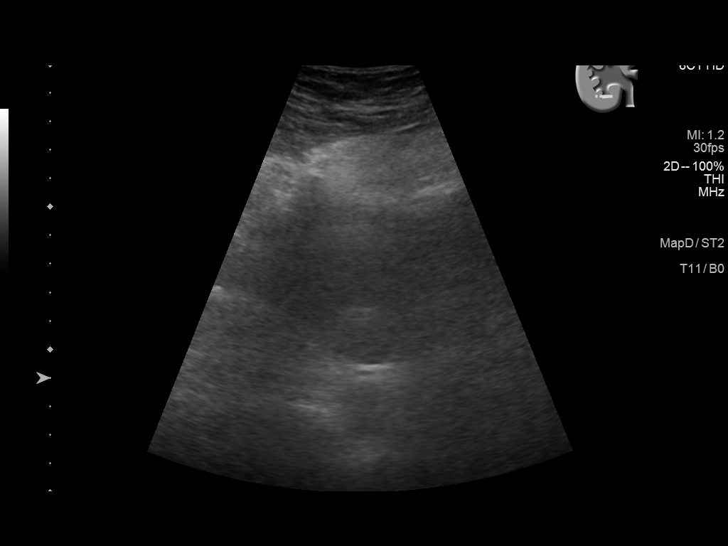
[im 11/33]
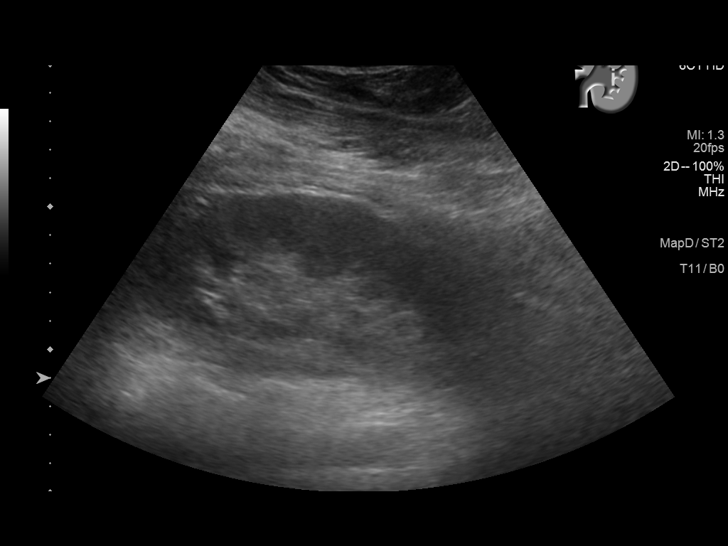
[im 13/33]
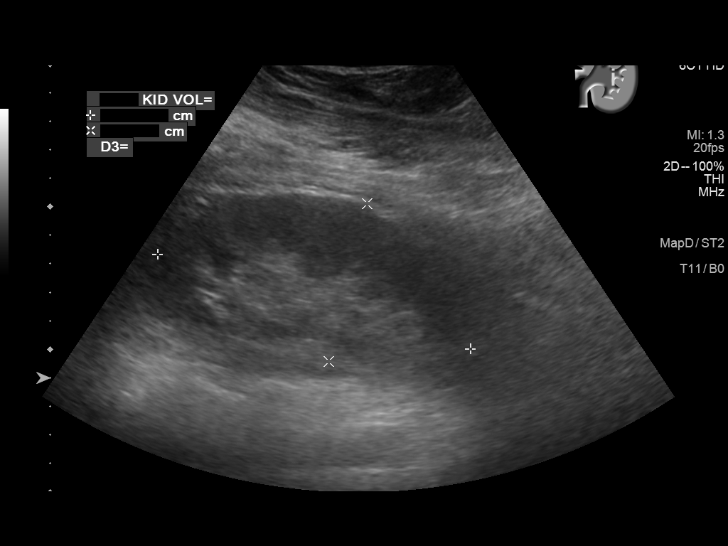
[im 15/33]
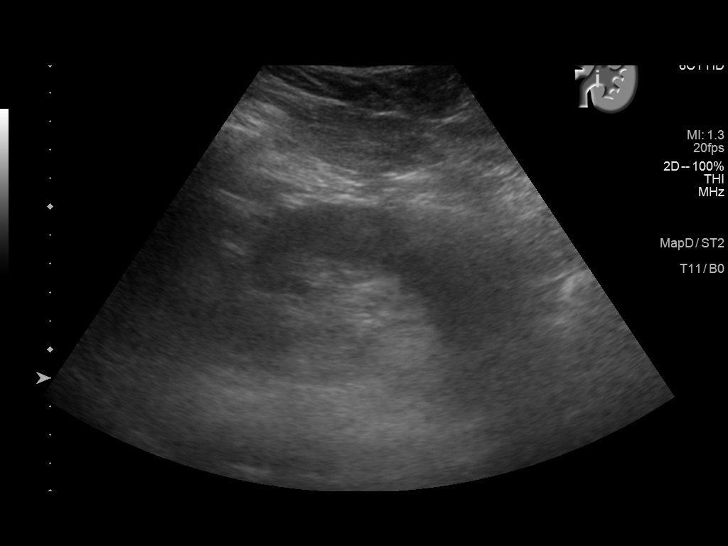
[im 18/33]
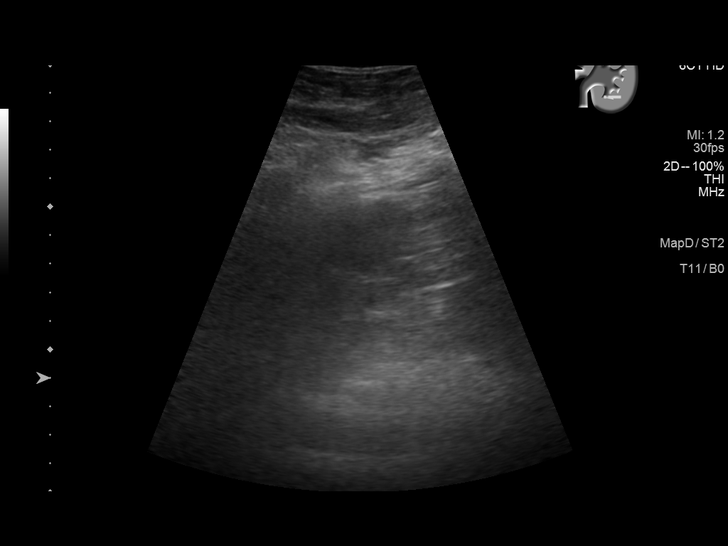
[im 21/33]
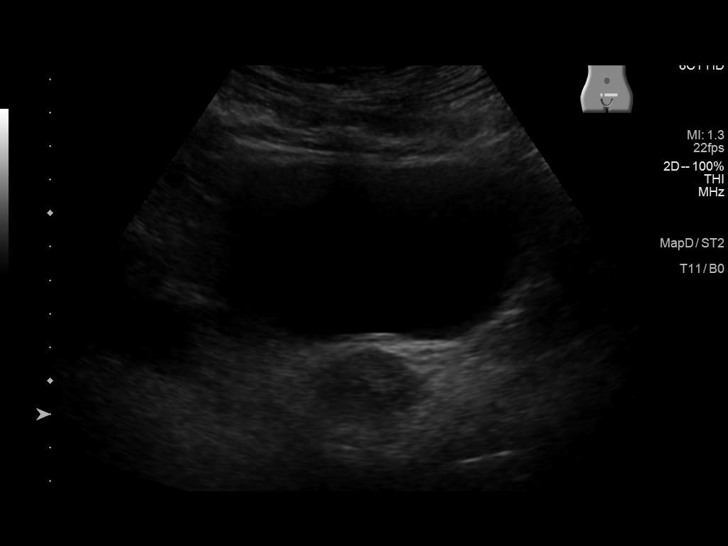
[im 22/33]
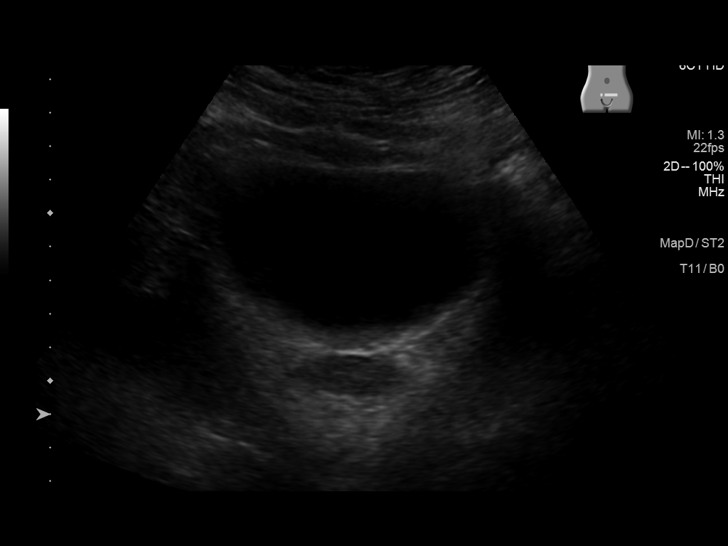
[im 25/33]
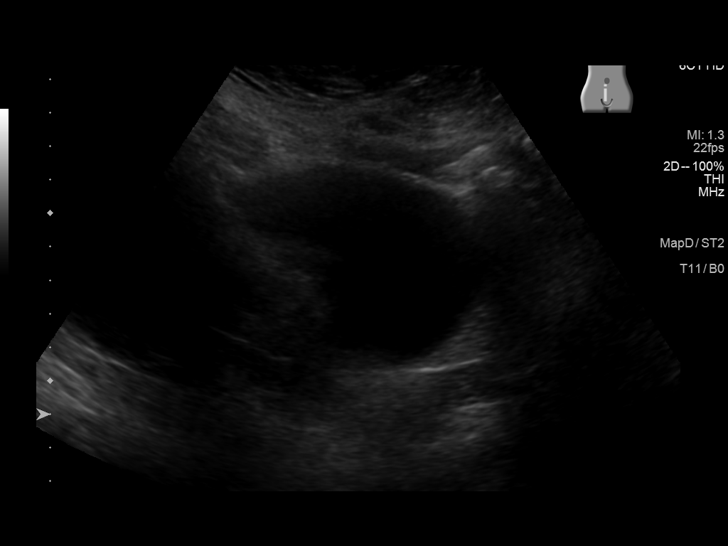
[im 27/33]
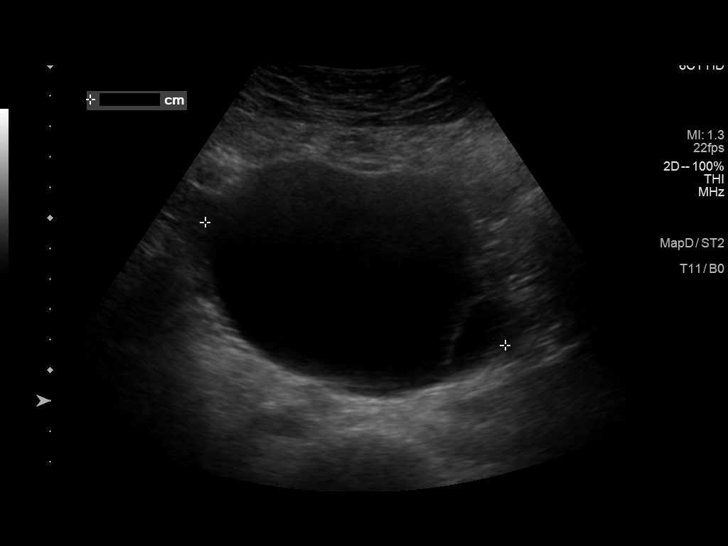
[im 30/33]
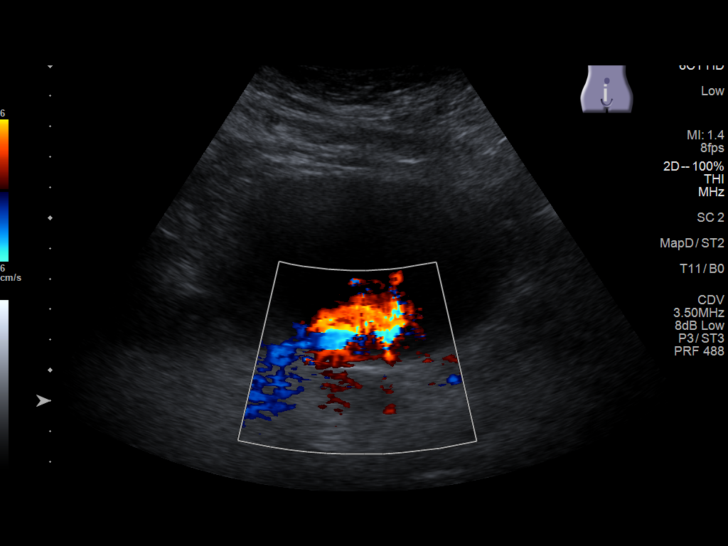
[im 33/33]
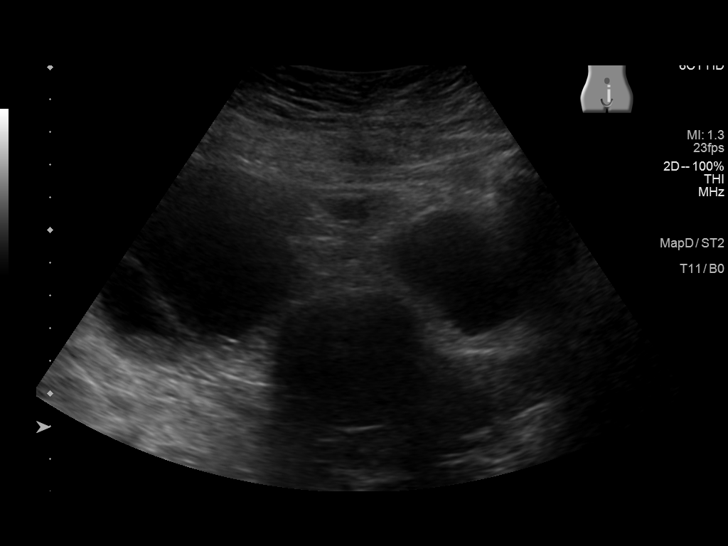

[14 of 25 positions shown; findings below may reference images not displayed]

FINDINGS: Right Kidney:

Renal measurements: 11.9 x 4.4 x 4.8 cm = volume: 134 mL.
Echogenicity within normal limits. No mass or hydronephrosis
visualized.

Left Kidney:

Renal measurements: 11.5 x 5.7 x 5.5 cm = volume: 188 mL.
Echogenicity within normal limits. No mass or hydronephrosis
visualized.

Bladder:

Appears normal for degree of bladder distention. Postvoid residual
in the bladder measures 33 cc.

Other:

Incidental note is made of 10.6 cm smooth marginated anechoic
structure in the right adnexa.
IMPRESSION: There is no hydronephrosis. Postvoid residual in the bladder
measures 33 cc.

There is 10.6 cm cystic structure in the right side of pelvis. This
may suggest functional cyst or cystic neoplasm in the ovary.
Follow-up pelvic sonogram may be considered for further evaluation.

## 2021-05-25 ENCOUNTER — Encounter: Payer: Self-pay | Admitting: Family Medicine

## 2021-05-28 NOTE — Telephone Encounter (Signed)
Appointment scheduled.

## 2021-05-28 NOTE — Telephone Encounter (Signed)
appt

## 2021-05-29 ENCOUNTER — Other Ambulatory Visit: Payer: Self-pay

## 2021-05-29 ENCOUNTER — Ambulatory Visit (INDEPENDENT_AMBULATORY_CARE_PROVIDER_SITE_OTHER): Payer: BC Managed Care – PPO | Admitting: Family Medicine

## 2021-05-29 ENCOUNTER — Encounter: Payer: Self-pay | Admitting: Family Medicine

## 2021-05-29 VITALS — BP 112/76 | HR 80 | Temp 98.7°F | Wt 308.0 lb

## 2021-05-29 DIAGNOSIS — M25552 Pain in left hip: Secondary | ICD-10-CM

## 2021-05-29 MED ORDER — KETOROLAC TROMETHAMINE 60 MG/2ML IM SOLN: 60.0000 mg | Freq: Once | INTRAMUSCULAR | Status: AC

## 2021-05-29 MED ORDER — CYCLOBENZAPRINE HCL 10 MG PO TABS: 10.0000 mg | ORAL_TABLET | Freq: Every day | ORAL | 3 refills | Status: DC

## 2021-05-29 MED ORDER — GABAPENTIN 100 MG PO CAPS: 100.0000 mg | ORAL_CAPSULE | Freq: Three times a day (TID) | ORAL | 3 refills | Status: DC

## 2021-05-29 MED ORDER — DICLOFENAC SODIUM 1 % EX GEL: 4.0000 g | Freq: Four times a day (QID) | CUTANEOUS | 12 refills | Status: AC

## 2021-05-29 NOTE — Progress Notes (Signed)
BP 112/76   Pulse 80   Temp 98.7 F (37.1 C) (Oral)   Wt (!) 308 lb (139.7 kg)   SpO2 98%   BMI 46.83 kg/m    Subjective:    Patient ID: Kimberly Santiago, female    DOB: April 08, 1976, 45 y.o.   MRN: [id number]  HPI: Kimberly Santiago is a 45 y.o. female  Chief Complaint  Patient presents with   Pain    Pt states she has had L leg and hip pain for a while. States the pain is throbbing and rates the pain at an 6.    HIP PAIN Duration: 2+ years Involved hip: left  Mechanism of injury: unknown Location: diffuse Onset: gradual  Severity: severe  Quality: shooting Frequency: constant Radiation: yes Aggravating factors: changing positions   Alleviating factors: tylenol, heat  Status: worse Treatments attempted: rest, ice, heat, APAP, and HEP   Relief with NSAIDs?:  unable to take NSAIDs Weakness with weight bearing: yes Weakness with walking: yes Paresthesias / decreased sensation: yes Swelling: no Redness:no Fevers: no   Relevant past medical, surgical, family and social history reviewed and updated as indicated. Interim medical history since our last visit reviewed. Allergies and medications reviewed and updated.  Review of Systems  Constitutional: Negative.   Respiratory: Negative.    Cardiovascular: Negative.   Gastrointestinal: Negative.   Musculoskeletal:  Positive for arthralgias, back pain, gait problem and myalgias. Negative for joint swelling, neck pain and neck stiffness.  Neurological:  Positive for weakness and numbness. Negative for dizziness, tremors, seizures, syncope, facial asymmetry, speech difficulty, light-headedness and headaches.   Per HPI unless specifically indicated above     Objective:    BP 112/76   Pulse 80   Temp 98.7 F (37.1 C) (Oral)   Wt (!) 308 lb (139.7 kg)   SpO2 98%   BMI 46.83 kg/m   Wt Readings from Last 3 Encounters:  05/29/21 (!) 308 lb (139.7 kg)  05/03/21 300 lb (136.1 kg)  04/04/21 300 lb (136.1 kg)     Physical Exam Vitals and nursing note reviewed.  Constitutional:      General: She is not in acute distress.    Appearance: Normal appearance. She is not ill-appearing, toxic-appearing or diaphoretic.  HENT:     Head: Normocephalic and atraumatic.     Right Ear: External ear normal.     Left Ear: External ear normal.     Nose: Nose normal.     Mouth/Throat:     Mouth: Mucous membranes are moist.     Pharynx: Oropharynx is clear.  Eyes:     General: No scleral icterus.       Right eye: No discharge.        Left eye: No discharge.     Extraocular Movements: Extraocular movements intact.     Conjunctiva/sclera: Conjunctivae normal.     Pupils: Pupils are equal, round, and reactive to light.  Cardiovascular:     Rate and Rhythm: Normal rate and regular rhythm.     Pulses: Normal pulses.     Heart sounds: Normal heart sounds. No murmur heard.   No friction rub. No gallop.  Pulmonary:     Effort: Pulmonary effort is normal. No respiratory distress.     Breath sounds: Normal breath sounds. No stridor. No wheezing, rhonchi or rales.  Chest:     Chest wall: No tenderness.  Musculoskeletal:        General: Normal range of motion.  Cervical back: Normal range of motion and neck supple.  Skin:    General: Skin is warm and dry.     Capillary Refill: Capillary refill takes less than 2 seconds.     Coloration: Skin is not jaundiced or pale.     Findings: No bruising, erythema, lesion or rash.  Neurological:     General: No focal deficit present.     Mental Status: She is alert and oriented to person, place, and time. Mental status is at baseline.  Psychiatric:        Mood and Affect: Mood normal.        Behavior: Behavior normal.        Thought Content: Thought content normal.        Judgment: Judgment normal.    Results for orders placed or performed in visit on 05/14/21  CULTURE, URINE COMPREHENSIVE   Specimen: Urine   UR  Result Value Ref Range   Urine Culture,  Comprehensive Final report (A)    Organism ID, Bacteria Comment (A)    Organism ID, Bacteria Comment   Bladder Scan (Post Void Residual) in office  Result Value Ref Range   Scan Result 420       Assessment & Plan:   Problem List Items Addressed This Visit   None Visit Diagnoses     Left hip pain    -  Primary   Concern for radiculopathy. Has been doing home exercises for 2 years and it is worsening. Will treat with flexeril and voltaren. Obtain x-rays. Likely needs MRI   Relevant Medications   ketorolac (TORADOL) injection 60 mg   Other Relevant Orders   DG Lumbar Spine Complete   DG Hip Unilat W OR W/O Pelvis 2-3 Views Left   DG FEMUR MIN 2 VIEWS LEFT        Follow up plan: Return in about 4 weeks (around 06/26/2021).

## 2021-05-30 ENCOUNTER — Ambulatory Visit
Admission: RE | Admit: 2021-05-30 | Discharge: 2021-05-30 | Disposition: A | Discharge status: Home / Self Care | Payer: BC Managed Care – PPO | Attending: Family Medicine | Admitting: Family Medicine

## 2021-05-30 ENCOUNTER — Ambulatory Visit
Admission: RE | Admit: 2021-05-30 | Discharge: 2021-05-30 | Disposition: A | Discharge status: Home / Self Care | Payer: BC Managed Care – PPO | Source: Ambulatory Visit | Attending: Family Medicine | Admitting: Family Medicine

## 2021-05-30 DIAGNOSIS — M25552 Pain in left hip: Secondary | ICD-10-CM

## 2021-05-30 DIAGNOSIS — M79652 Pain in left thigh: Secondary | ICD-10-CM | POA: Diagnosis not present

## 2021-05-30 DIAGNOSIS — M1712 Unilateral primary osteoarthritis, left knee: Secondary | ICD-10-CM | POA: Diagnosis not present

## 2021-05-30 DIAGNOSIS — M545 Low back pain, unspecified: Secondary | ICD-10-CM | POA: Diagnosis not present

## 2021-05-30 IMAGING — DX DG FEMUR 2+V*L*
5 series · 5 of 5 positions shown · non-contrast
Comparison: None.

CLINICAL DATA: Pain

EXAM:
LEFT FEMUR 2 VIEWS

[femur ap (1 of 3)]
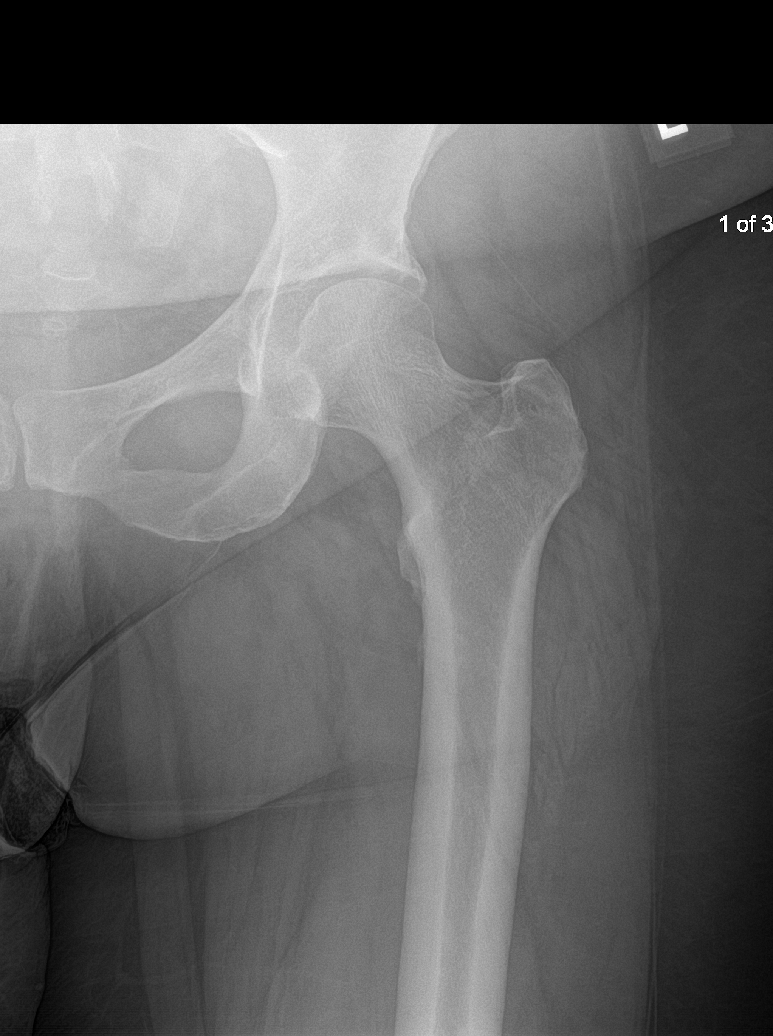

[femur ap (2 of 3)]
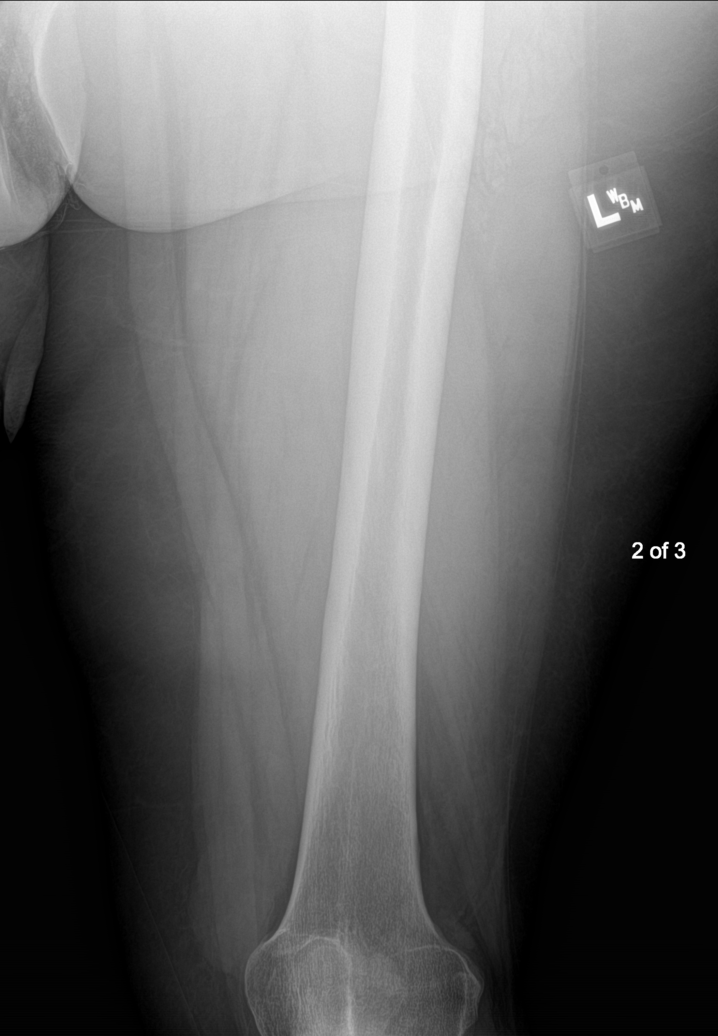

[femur lat (1 of 2)]
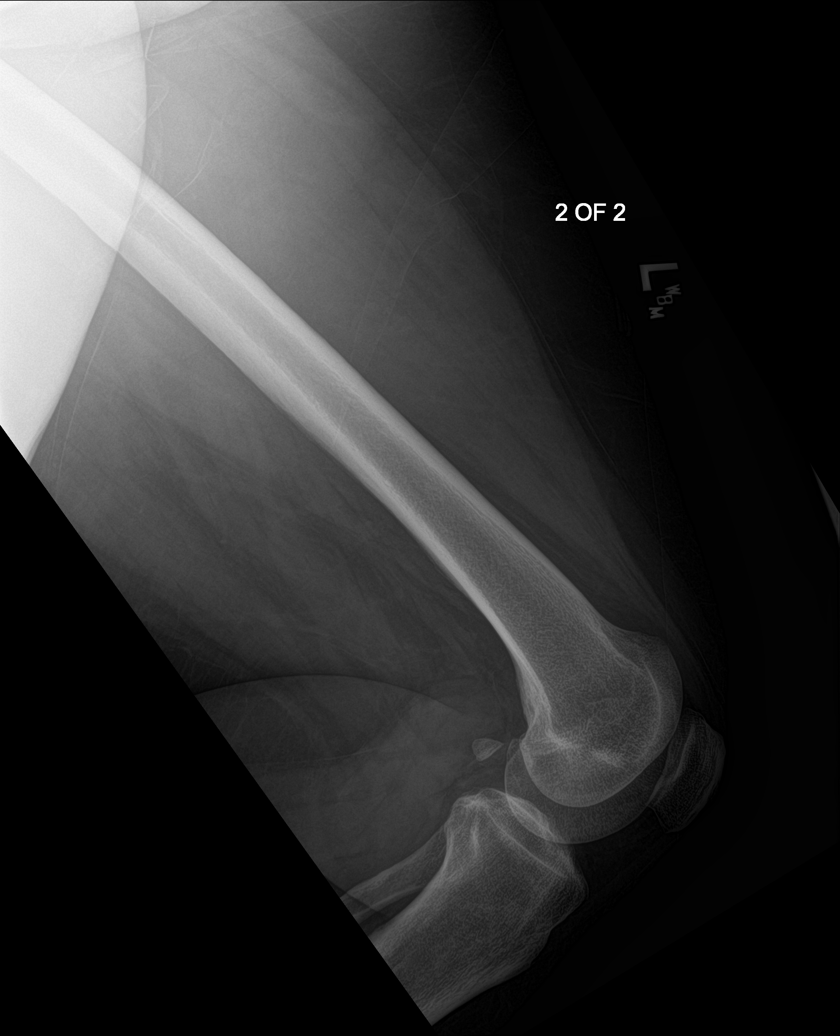

[femur ap (3 of 3)]
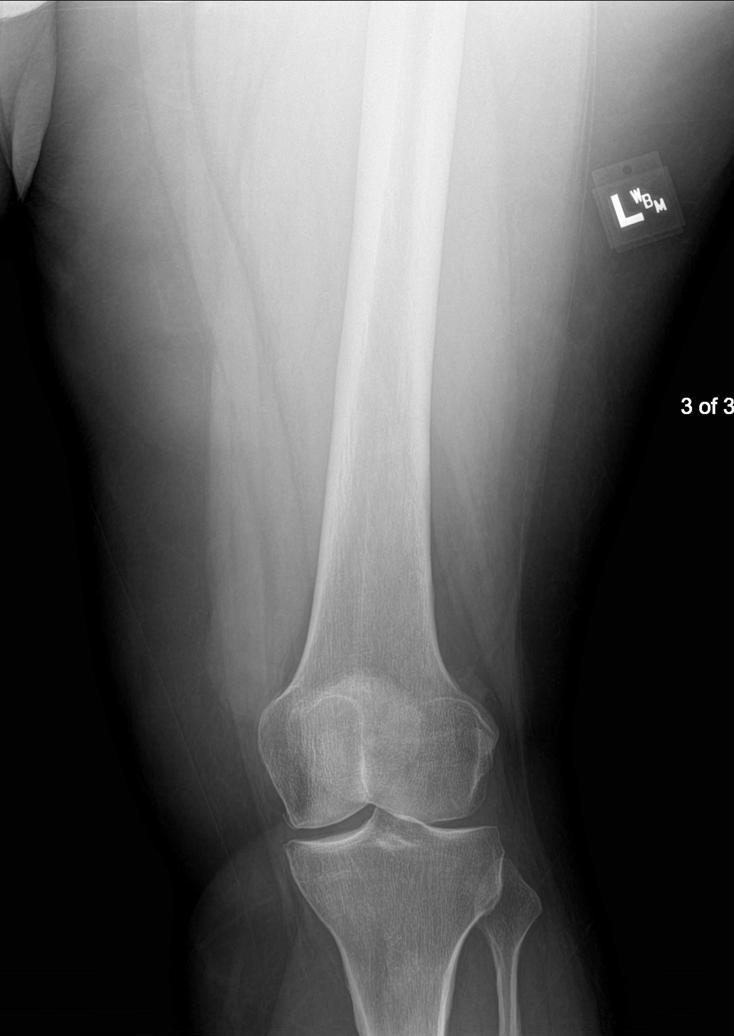

[femur lat (2 of 2)]
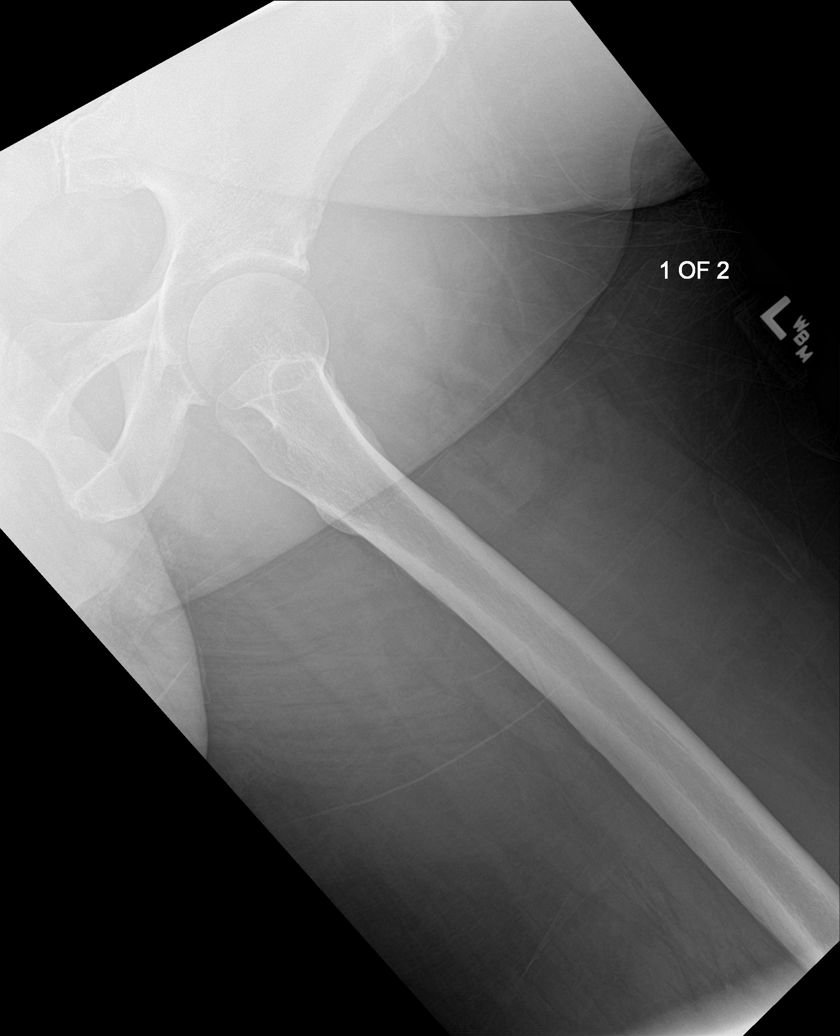

[5 of 5 positions shown; findings below may reference images not displayed]

FINDINGS: No fracture or dislocation is seen. There are no focal lytic or
sclerotic lesions. Degenerative changes are noted with bony spurs in
the left knee, more so in the medial compartment. There is no
significant effusion in the suprapatellar bursa.
IMPRESSION: No fracture or dislocation is seen. There are no focal lytic or
sclerotic lesions. Degenerative changes are noted in the left knee
more so in the medial compartment.

## 2021-05-30 IMAGING — DX DG HIP (WITH OR WITHOUT PELVIS) 2-3V*L*
3 series · 3 of 3 positions shown · non-contrast
Comparison: None.

CLINICAL DATA: Pain

EXAM:
DG HIP (WITH OR WITHOUT PELVIS) 2-3V LEFT

[hip ap]
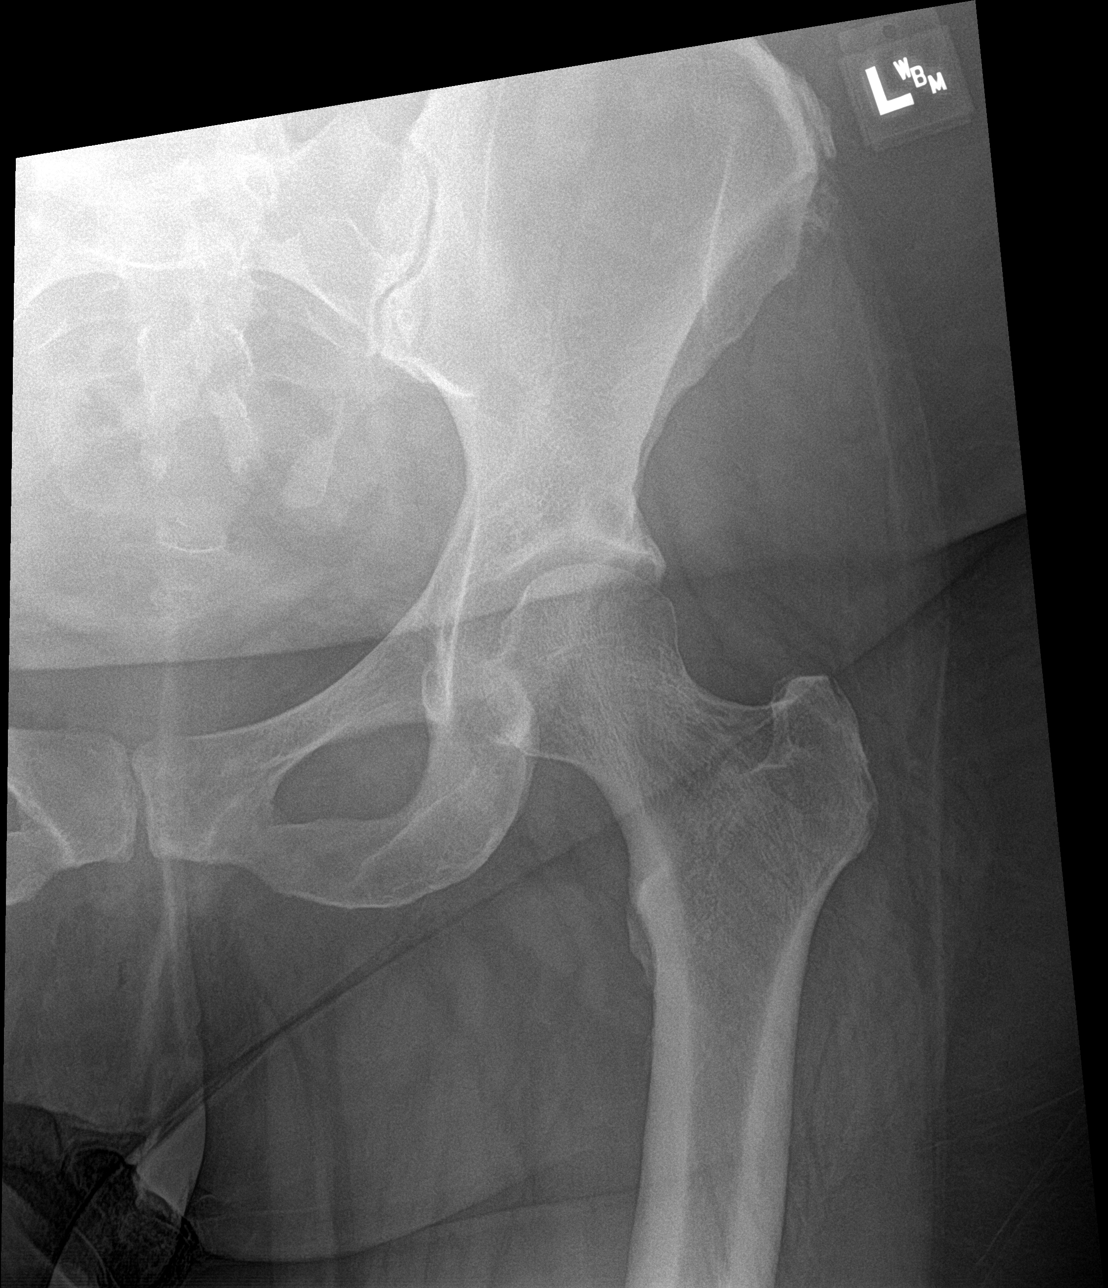

[hip lat]
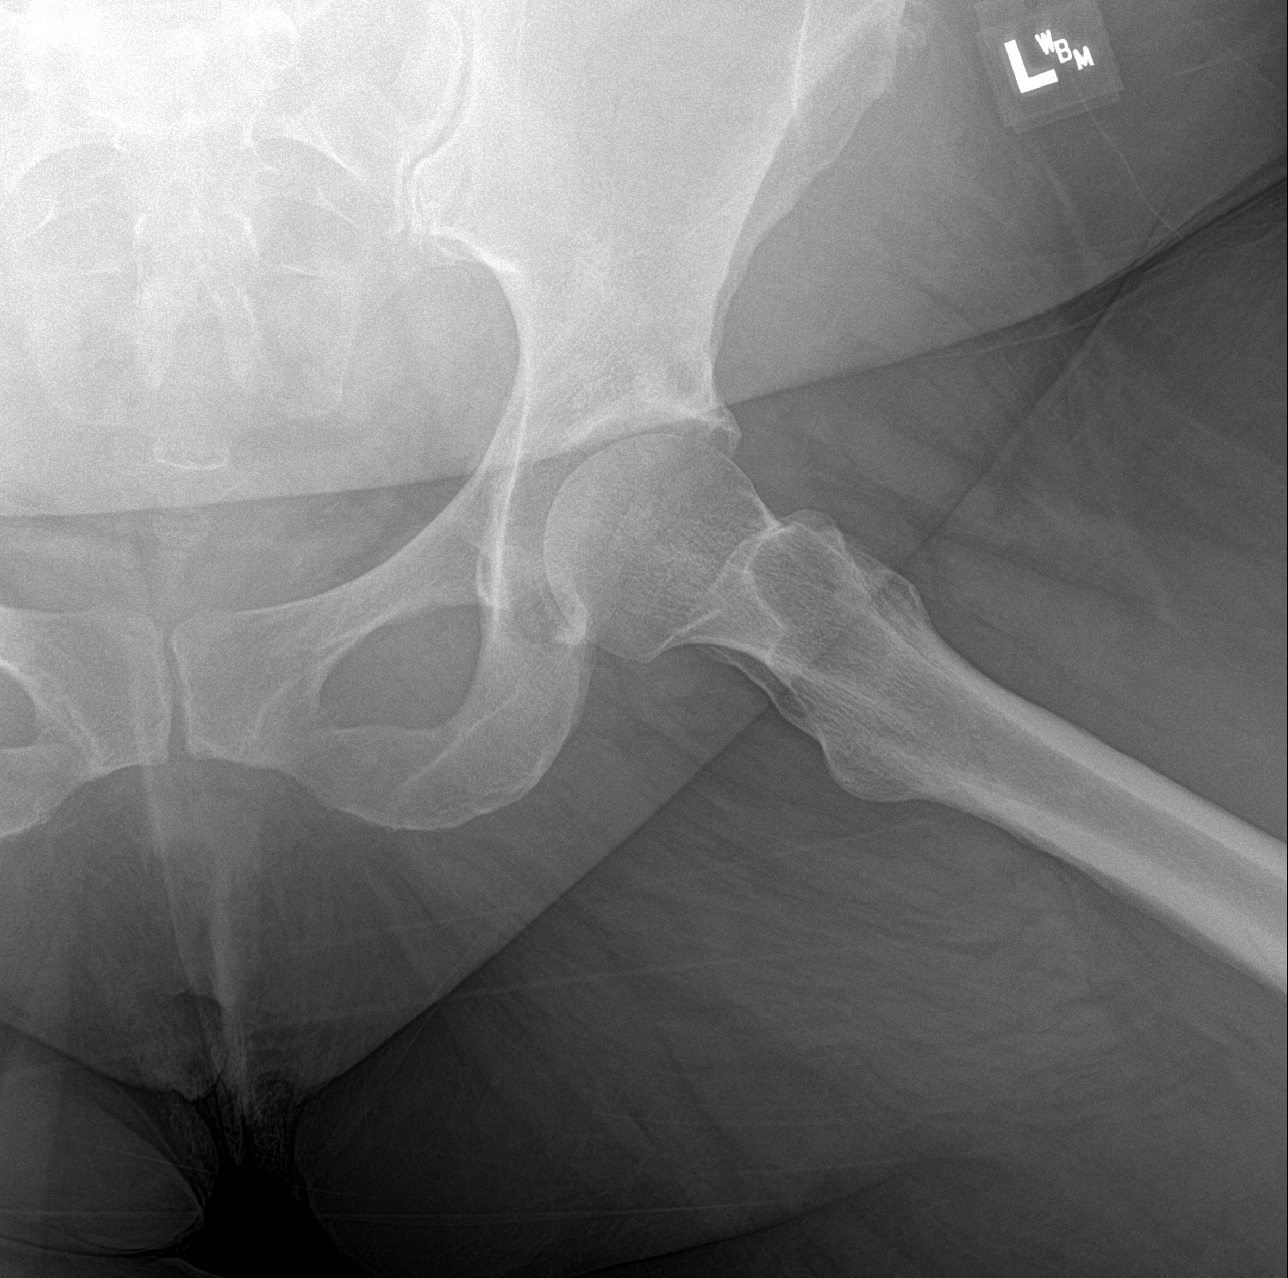

[pelvis ap]
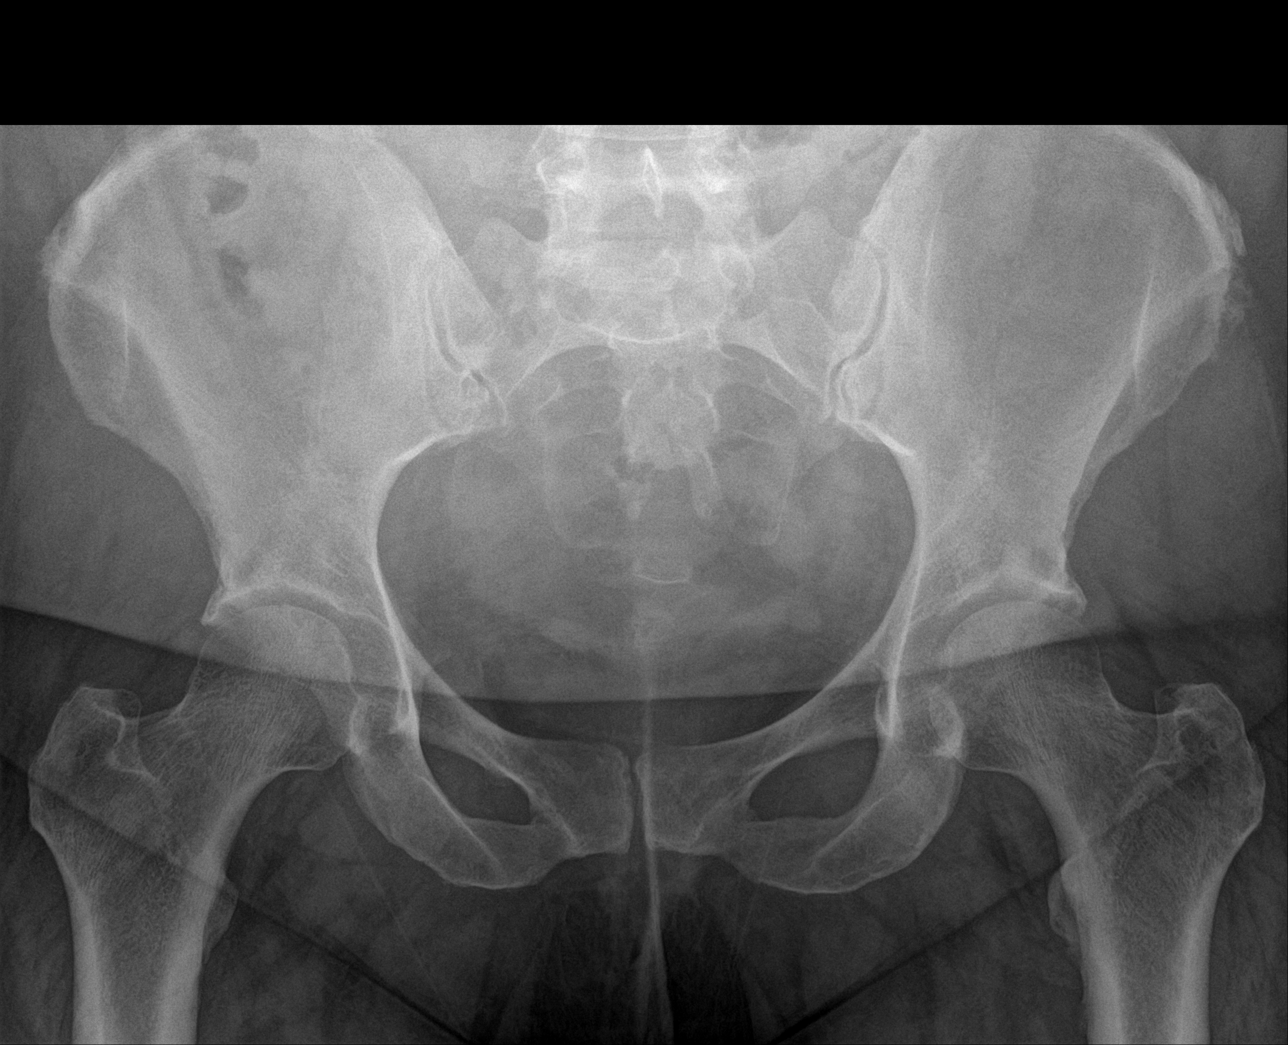

[3 of 3 positions shown; findings below may reference images not displayed]

FINDINGS: No fracture or dislocation is seen. There is no significant joint
space narrowing. There are no focal lytic or sclerotic lesions.
There is mild cortical irregularity in the lateral aspect of iliac
crests on both sides, more so on the left side which may be residual
from previous ligament injury.
IMPRESSION: No significant radiographic abnormality is seen in the left hip.

## 2021-05-30 IMAGING — DX DG LUMBAR SPINE COMPLETE 4+V
5 series · 5 of 5 positions shown · non-contrast
Comparison: [DATE]

CLINICAL DATA: Left buttock and hip pain

EXAM:
LUMBAR SPINE - COMPLETE 4+ VIEW

[l-spine ap]
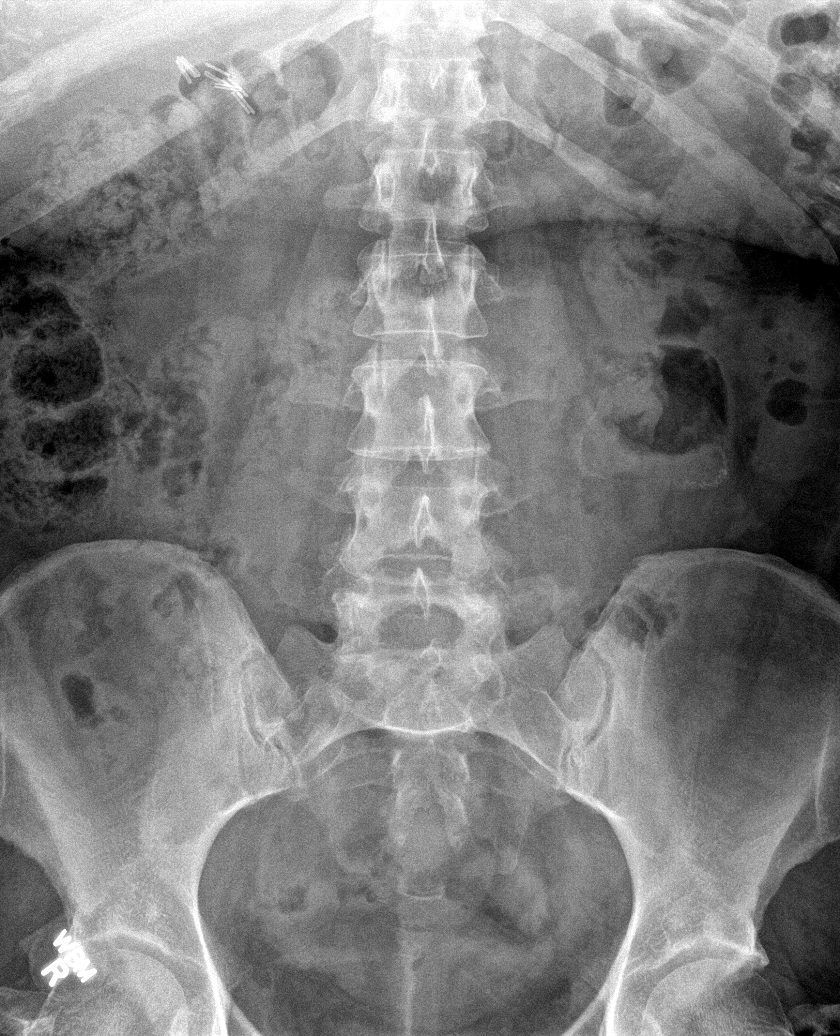

[l-spine obl (1 of 2)]
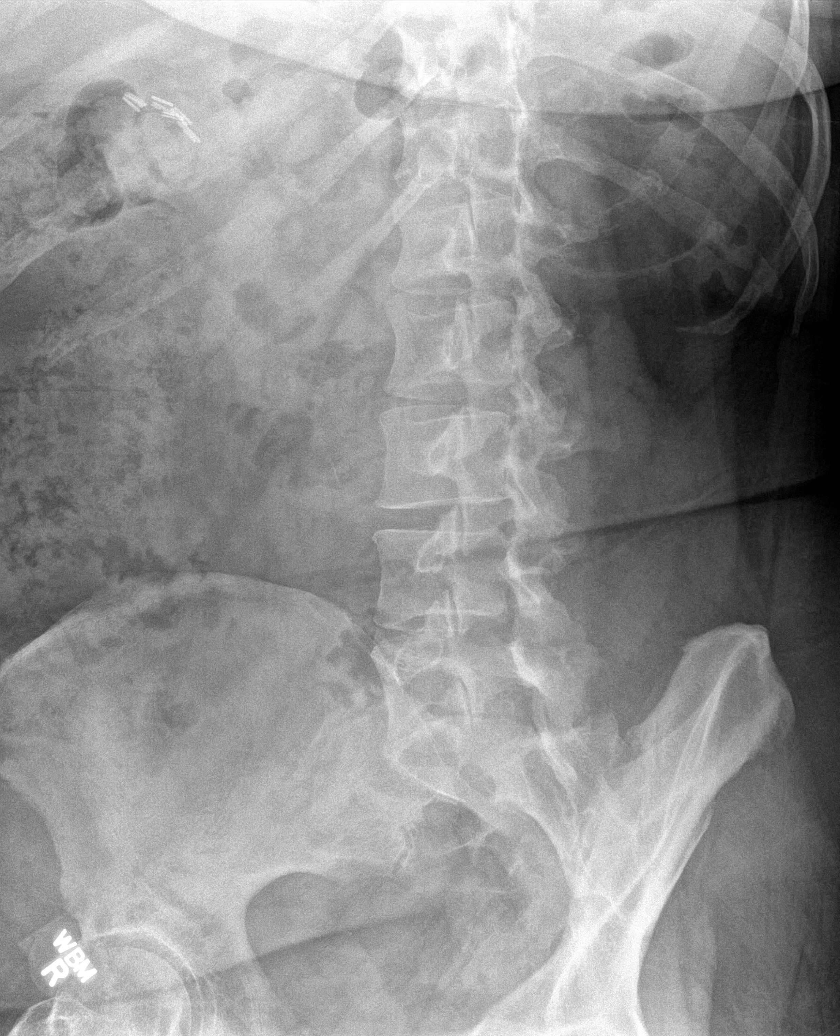

[l-spine obl (2 of 2)]
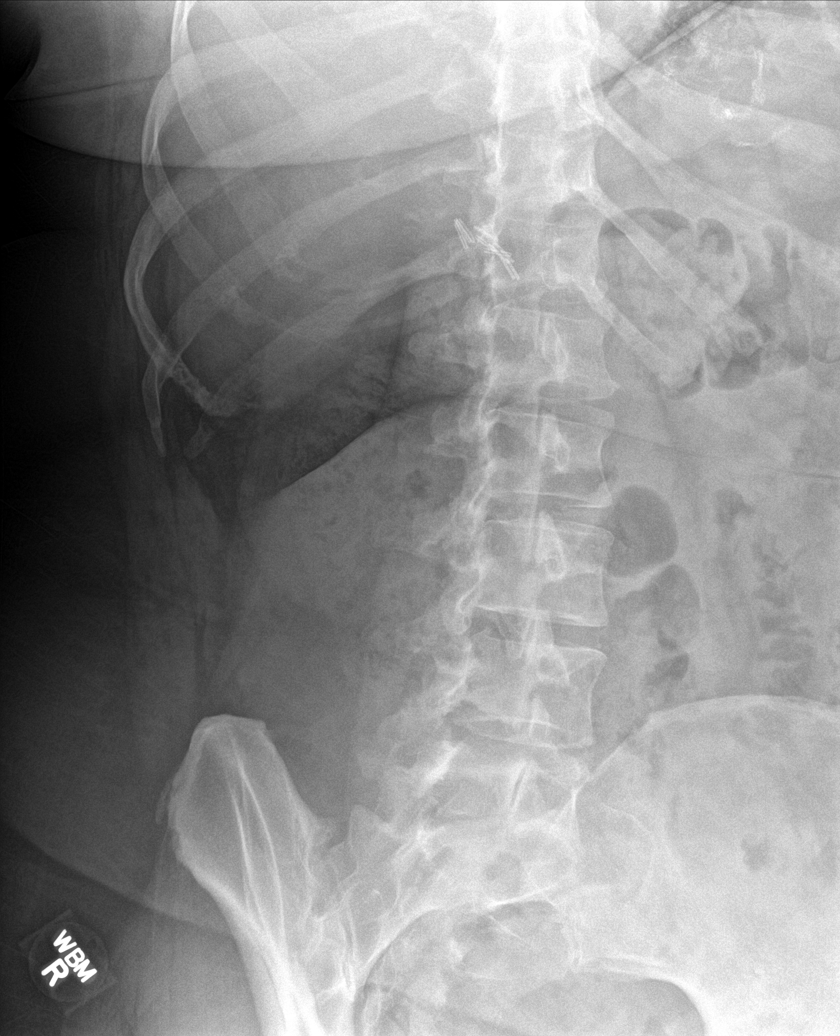

[l-spine lat]
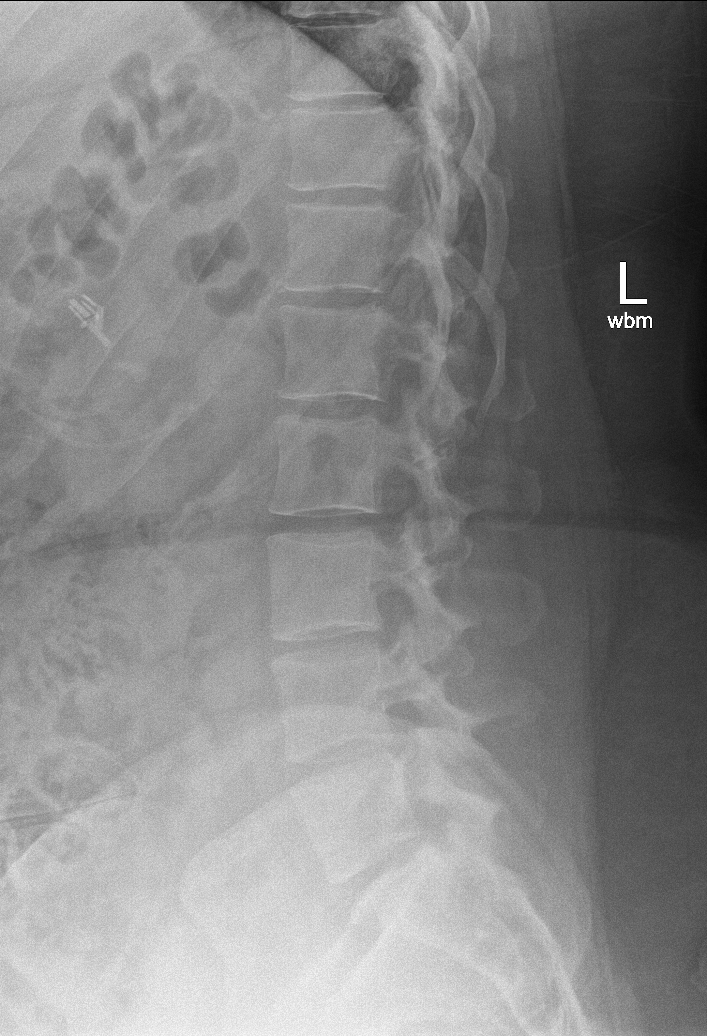

[l-spine spot]
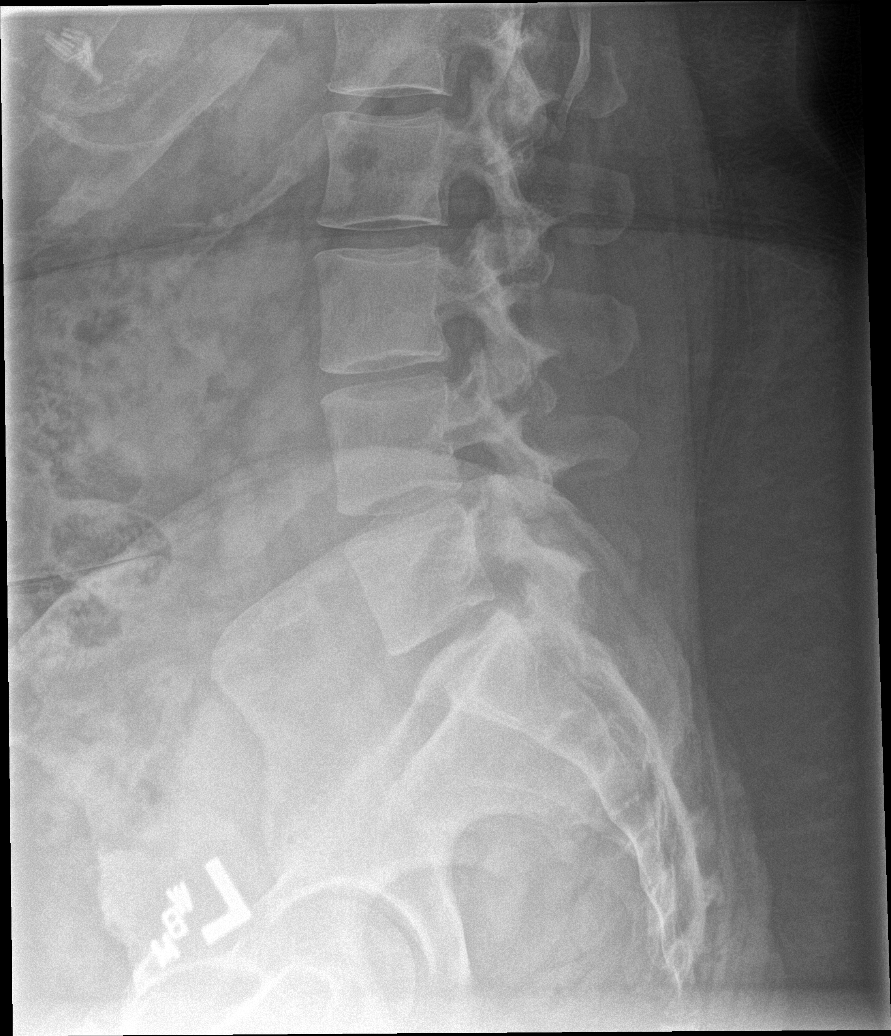

[5 of 5 positions shown; findings below may reference images not displayed]

FINDINGS: There is no evidence of lumbar spine fracture. Alignment is normal.
Intervertebral disc spaces are maintained. Nonobstructive pattern of
overlying bowel gas.
IMPRESSION: No fracture or dislocation of the lumbar spine. Disc spaces and
vertebral body heights are preserved. Lumbar disc and neural
foraminal pathology may be further evaluated by MRI if indicated by
neurologically localizing signs and symptoms.

## 2021-06-01 ENCOUNTER — Encounter: Payer: Self-pay | Admitting: Family Medicine

## 2021-06-04 DIAGNOSIS — Z01419 Encounter for gynecological examination (general) (routine) without abnormal findings: Secondary | ICD-10-CM | POA: Diagnosis not present

## 2021-06-04 DIAGNOSIS — N3946 Mixed incontinence: Secondary | ICD-10-CM | POA: Diagnosis not present

## 2021-06-04 DIAGNOSIS — Z124 Encounter for screening for malignant neoplasm of cervix: Secondary | ICD-10-CM | POA: Diagnosis not present

## 2021-06-04 DIAGNOSIS — N803 Endometriosis of pelvic peritoneum, unspecified: Secondary | ICD-10-CM | POA: Diagnosis not present

## 2021-06-04 DIAGNOSIS — F419 Anxiety disorder, unspecified: Secondary | ICD-10-CM | POA: Diagnosis not present

## 2021-06-04 DIAGNOSIS — N83201 Unspecified ovarian cyst, right side: Secondary | ICD-10-CM | POA: Diagnosis not present

## 2021-06-04 DIAGNOSIS — Z6841 Body Mass Index (BMI) 40.0 and over, adult: Secondary | ICD-10-CM | POA: Diagnosis not present

## 2021-06-05 ENCOUNTER — Encounter: Payer: Self-pay | Admitting: Urology

## 2021-06-06 ENCOUNTER — Other Ambulatory Visit: Payer: Self-pay

## 2021-06-06 ENCOUNTER — Ambulatory Visit (INDEPENDENT_AMBULATORY_CARE_PROVIDER_SITE_OTHER): Payer: BC Managed Care – PPO | Admitting: Urology

## 2021-06-06 ENCOUNTER — Encounter: Payer: Self-pay | Admitting: Urology

## 2021-06-06 ENCOUNTER — Other Ambulatory Visit: Payer: Self-pay | Admitting: Family Medicine

## 2021-06-06 VITALS — BP 101/66 | HR 87 | Temp 97.9°F | Ht 67.0 in | Wt 305.0 lb

## 2021-06-06 DIAGNOSIS — N83201 Unspecified ovarian cyst, right side: Secondary | ICD-10-CM

## 2021-06-06 DIAGNOSIS — M4726 Other spondylosis with radiculopathy, lumbar region: Secondary | ICD-10-CM

## 2021-06-06 DIAGNOSIS — R300 Dysuria: Secondary | ICD-10-CM | POA: Diagnosis not present

## 2021-06-06 DIAGNOSIS — N3941 Urge incontinence: Secondary | ICD-10-CM | POA: Diagnosis not present

## 2021-06-06 DIAGNOSIS — N3281 Overactive bladder: Secondary | ICD-10-CM | POA: Diagnosis not present

## 2021-06-06 DIAGNOSIS — N393 Stress incontinence (female) (male): Secondary | ICD-10-CM | POA: Diagnosis not present

## 2021-06-06 LAB — URINALYSIS, COMPLETE
Bilirubin, UA: NEGATIVE
Glucose, UA: NEGATIVE
Nitrite, UA: NEGATIVE
Specific Gravity, UA: >1.03 — ABNORMAL HIGH (ref 1.005–1.030)
Urobilinogen, Ur: 0.2 mg/dL (ref 0.2–1.0)
pH, UA: 5.5 (ref 5.0–7.5)

## 2021-06-06 LAB — BLADDER SCAN AMB NON-IMAGING

## 2021-06-06 LAB — MICROSCOPIC EXAMINATION: RBC, Urine: >30 /hpf — ABNORMAL HIGH (ref 0–2)

## 2021-06-06 MED ORDER — CEFUROXIME AXETIL 500 MG PO TABS: 500.0000 mg | ORAL_TABLET | Freq: Two times a day (BID) | ORAL | 0 refills | Status: DC

## 2021-06-06 NOTE — Telephone Encounter (Signed)
Patient was seen in clinic today by PA-C Michiel Cowboy and treated.

## 2021-06-06 NOTE — Progress Notes (Signed)
06/06/2021 10:00 AM   Kimberly Santiago 1976-05-07 [id number]  Referring provider: Valerie Roys, DO [place],  [place] [id number]  Chief Complaint  Patient presents with   Dysuria    Urological history: 1. Incontinence -contributing factors of age, sleep apnea, anxiety and depression -PVR 477 mL -managed with incontinence pads  HPI: Kimberly Santiago is a 45 y.o. female who presents today for pain with urination.  She underwent urodynamic testing on Monday.  She states that they had a difficult time getting in the catheter for the study.  After her urodynamics test, she went and had a Pap smear performed as well.  Then Tuesday afternoon, she started having urgency, frequency, and intense pain after voiding and feeling like she had a bowling ball in her stomach.  She also noted blood on her toilet tissue after wiping, but she denied any gross hematuria.  Patient denies any modifying or aggravating factors.  Patient denies any denies any fevers, chills, nausea or vomiting.    UA 11-30 WBCs, greater than 30 RBCs and a few bacteria  PVR 180 mL  RUS 05/2021 -large ovarian cyst for which she has followed up with gynecology  PMH: Past Medical History:  Diagnosis Date   Anemia    Asthma    Barrett esophagus    Complication of anesthesia    Pt stopped breathing during EGD but did well with recent Gastric Bypass Surgery   Depression    GERD (gastroesophageal reflux disease)    H/O   Headache    H/O MIGRAINES   History of hiatal hernia    SMALL   PCOS (polycystic ovarian syndrome)    PONV (postoperative nausea and vomiting)    Sleep apnea    (mild) PT IS NOT USING CPAP   Status post repeat low transverse cesarean section 07/27/2019    Surgical History: Past Surgical History:  Procedure Laterality Date   CESAREAN SECTION N/A 08/18/2017   Procedure: Primary CESAREAN SECTION;  Surgeon: Aloha Gell, MD;  Location: [facility];  Service: Obstetrics;   Laterality: N/A;  EDD: 09/13/17 Allergy: Amoxicillin, Morphine, Hydrocodone   CESAREAN SECTION WITH BILATERAL TUBAL LIGATION Bilateral 07/27/2019   Procedure: Repeat CESAREAN SECTION WITH BILATERAL TUBAL LIGATION;  Surgeon: Aloha Gell, MD;  Location: [facility];  Service: Obstetrics;  Laterality: Bilateral;  EDD: 07/31/19   CHOLECYSTECTOMY     COLONOSCOPY WITH PROPOFOL N/A 07/07/2020   Procedure: COLONOSCOPY WITH PROPOFOL;  Surgeon: Lucilla Lame, MD;  Location: [facility];  Service: Endoscopy;  Laterality: N/A;   DIAGNOSTIC LAPAROSCOPY     EAR CYST EXCISION N/A 11/24/2014   Procedure: CYST REMOVAL;  Surgeon: Carloyn Manner, MD;  Location: [facility];  Service: ENT;  Laterality: N/A;  upper lip   ESOPHAGOGASTRODUODENOSCOPY     ESOPHAGOGASTRODUODENOSCOPY (EGD) WITH PROPOFOL N/A 07/07/2020   Procedure: ESOPHAGOGASTRODUODENOSCOPY (EGD) WITH PROPOFOL;  Surgeon: Lucilla Lame, MD;  Location: [facility];  Service: Endoscopy;  Laterality: N/A;  sleep apnea   ETHMOIDECTOMY Right 05/07/2018   Procedure: ETHMOIDECTOMY;  Surgeon: Carloyn Manner, MD;  Location: [facility];  Service: ENT;  Laterality: Right;   FRONTAL SINUS EXPLORATION Right 05/07/2018   Procedure: FRONTAL SINUS EXPLORATION;  Surgeon: Carloyn Manner, MD;  Location: [facility];  Service: ENT;  Laterality: Right;   GASTRIC BYPASS  March 2016   IMAGE GUIDED SINUS SURGERY N/A 05/07/2018   Procedure: IMAGE GUIDED SINUS SURGERY;  Surgeon: Carloyn Manner, MD;  Location: [facility];  Service:  ENT;  Laterality: N/A;   KNEE ARTHROSCOPY Right    MAXILLARY ANTROSTOMY Bilateral 05/07/2018   Procedure: MAXILLARY ANTROSTOMY;  Surgeon: Carloyn Manner, MD;  Location: [facility];  Service: ENT;  Laterality: Bilateral;   NASAL SEPTOPLASTY W/ TURBINOPLASTY Bilateral 05/07/2018   Procedure: NASAL SEPTOPLASTY WITH TURBINATE REDUCTION;  Surgeon: Carloyn Manner, MD;  Location: [facility];  Service: ENT;  Laterality: Bilateral;   SPHENOIDECTOMY Right  05/07/2018   Procedure: Coralee Pesa;  Surgeon: Carloyn Manner, MD;  Location: [facility];  Service: ENT;  Laterality: Right;   TEMPOROMANDIBULAR JOINT SURGERY     WISDOM TOOTH EXTRACTION      Home Medications:  Allergies as of 06/06/2021       Reactions   Hydromorphone Shortness Of Breath, Nausea And Vomiting, Other (See Comments)   Chest pain   Hydrocodone Nausea And Vomiting, Other (See Comments)   Headache, Patient can take oxycodone.   Morphine And Related Nausea And Vomiting, Other (See Comments)   Headache   Nsaids Other (See Comments)   Pt cannot take due to Gastric Bypass Surgery   Amoxicillin Itching   Can take cephalexin. Did it involve swelling of the face/tongue/throat, SOB, or low BP? No Did it involve sudden or severe rash/hives, skin peeling, or any reaction on the inside of your mouth or nose? No Did you need to seek medical attention at a hospital or doctor's office? No When did it last happen?  Several years     If all above answers are "NO", may proceed with cephalosporin use.        Medication List        Accurate as of June 06, 2021 11:59 PM. If you have any questions, ask your nurse or doctor.          acetaminophen 325 MG tablet Commonly known as: TYLENOL Take 650 mg by mouth every 6 (six) hours as needed for mild pain or headache.   busPIRone 7.5 MG tablet Commonly known as: BUSPAR Take 7.5 mg by mouth at bedtime.   cefUROXime 500 MG tablet Commonly known as: CEFTIN Take 1 tablet (500 mg total) by mouth 2 (two) times daily with a meal. Started by: Shanina Kepple, PA-C   citalopram 40 MG tablet Commonly known as: CELEXA TAKE 1 TABLET BY MOUTH EVERYDAY AT BEDTIME   cyclobenzaprine 10 MG tablet Commonly known as: FLEXERIL Take 1 tablet (10 mg total) by mouth at bedtime.   diclofenac Sodium 1 % Gel Commonly known as: Voltaren Apply 4 g topically 4 (four) times daily.   famotidine 20 MG tablet Commonly known as: PEPCID Take 1  tablet (20 mg total) by mouth at bedtime.   fexofenadine 180 MG tablet Commonly known as: ALLEGRA Take 180 mg by mouth daily.   gabapentin 100 MG capsule Commonly known as: NEURONTIN Take 1 capsule (100 mg total) by mouth 3 (three) times daily.   Junel 1/20 1-20 MG-MCG tablet Generic drug: norethindrone-ethinyl estradiol Take 1 tablet by mouth daily.   nystatin powder Commonly known as: MYCOSTATIN/NYSTOP Apply 1 application topically 3 (three) times daily.   pantoprazole 40 MG tablet Commonly known as: PROTONIX TAKE 1 TABLET BY MOUTH TWICE A DAY   scopolamine 1 MG/3DAYS Commonly known as: Transderm-Scop (1.5 MG) Place 1 patch (1.5 mg total) onto the skin every 3 (three) days.        Allergies:  Allergies  Allergen Reactions   Hydromorphone Shortness Of Breath, Nausea And Vomiting and Other (See Comments)    Chest pain  Hydrocodone Nausea And Vomiting and Other (See Comments)    Headache, Patient can take oxycodone.   Morphine And Related Nausea And Vomiting and Other (See Comments)    Headache   Nsaids Other (See Comments)    Pt cannot take due to Gastric Bypass Surgery   Amoxicillin Itching    Can take cephalexin. Did it involve swelling of the face/tongue/throat, SOB, or low BP? No Did it involve sudden or severe rash/hives, skin peeling, or any reaction on the inside of your mouth or nose? No Did you need to seek medical attention at a hospital or doctor's office? No When did it last happen?  Several years     If all above answers are "NO", may proceed with cephalosporin use.     Family History: Family History  Problem Relation Age of Onset   Breast cancer Paternal Aunt        pat great aunt   Heart disease Maternal Grandmother    Heart disease Maternal Grandfather    Heart disease Paternal Grandmother    Heart disease Paternal Grandfather    Anxiety disorder Mother    Depression Mother    Miscarriages / [place] Sister     Social History:   reports that she has never smoked. She has never used smokeless tobacco. She reports that she does not currently use alcohol. She reports that she does not use drugs.   Physical Exam: BP 101/66   Pulse 87   Temp 97.9 F (36.6 C) (Oral)   Ht 5\' 7"  (1.702 m)   Wt (!) 305 lb (138.3 kg)   BMI 47.77 kg/m   Constitutional:  Well nourished. Alert and oriented, No acute distress. HEENT: [place] AT, mask in place.  Trachea midline Cardiovascular: No clubbing, cyanosis, or edema. Respiratory: Normal respiratory effort, no increased work of breathing. Neurologic: Grossly intact, no focal deficits, moving all 4 extremities. Psychiatric: Normal mood and affect.     Laboratory Data: Urinalysis Component     Latest Ref Rng & Units 06/06/2021  Specific Gravity, UA     1.005 - 1.030 >1.030 (H)  pH, UA     5.0 - 7.5 5.5  Color, UA     Yellow Yellow  Appearance Ur     Clear Cloudy (A)  Leukocytes,UA     Negative 1+ (A)  Protein,UA     Negative/Trace 2+ (A)  Glucose, UA     Negative Negative  Ketones, UA     Negative Trace (A)  RBC, UA     Negative 3+ (A)  Bilirubin, UA     Negative Negative  Urobilinogen, Ur     0.2 - 1.0 mg/dL 0.2  Nitrite, UA     Negative Negative  Microscopic Examination      See below:   Component     Latest Ref Rng & Units 06/06/2021          WBC, UA     0 - 5 /hpf 11-30 (A)  RBC     0 - 2 /hpf >30 (H)  Epithelial Cells (non renal)     0 - 10 /hpf 0-10  Bacteria, UA     None seen/Few Few (A)  I have reviewed the labs.   Pertinent Imaging  06/06/21 10:45  Scan Result 162mL  CLINICAL DATA:  Overactive bladder   EXAM: RENAL / URINARY TRACT ULTRASOUND COMPLETE   COMPARISON:  06/08/2012   FINDINGS: Right Kidney:   Renal measurements: 11.9 x 4.4 x 4.8  cm = volume: 134 mL. Echogenicity within normal limits. No mass or hydronephrosis visualized.   Left Kidney:   Renal measurements: 11.5 x 5.7 x 5.5 cm = volume: 188 mL. Echogenicity within normal  limits. No mass or hydronephrosis visualized.   Bladder:   Appears normal for degree of bladder distention. Postvoid residual in the bladder measures 33 cc.   Other:   Incidental note is made of 10.6 cm smooth marginated anechoic structure in the right adnexa.   IMPRESSION: There is no hydronephrosis. Postvoid residual in the bladder measures 33 cc.   There is 10.6 cm cystic structure in the right side of pelvis. This may suggest functional cyst or cystic neoplasm in the ovary. Follow-up pelvic sonogram may be considered for further evaluation.     Electronically Signed   By: Ernie Avena M.D.   On: 05/23/2021 14:18  I have independently reviewed the films.  See HPI.     Assessment & Plan:    1. Dysuria -Likely secondary to infection -UA with pyuria, micro heme and a few bacteria -Urine culture pending -Start Ceftin 500 mg twice daily x7 days, will adjust if necessary once culture results are available  2.  Overactive bladder with urge incontinence -currently being evaluated by Dr. Sherron Monday -has appointment 06/18/2021 with Dr. Sherron Monday for cysto and UDS report  3. Stress incontinence -see #2  4. Right ovarian cyst -Followed by gynecology   Michiel Cowboy, PA-C  [facility] [address], [address] [place], [place] [address] [phone]

## 2021-06-07 ENCOUNTER — Other Ambulatory Visit: Payer: Self-pay | Admitting: Urology

## 2021-06-09 ENCOUNTER — Encounter: Payer: Self-pay | Admitting: Emergency Medicine

## 2021-06-09 ENCOUNTER — Other Ambulatory Visit: Payer: Self-pay

## 2021-06-09 DIAGNOSIS — N390 Urinary tract infection, site not specified: Secondary | ICD-10-CM | POA: Insufficient documentation

## 2021-06-09 DIAGNOSIS — A419 Sepsis, unspecified organism: Secondary | ICD-10-CM | POA: Insufficient documentation

## 2021-06-09 DIAGNOSIS — R300 Dysuria: Secondary | ICD-10-CM | POA: Diagnosis not present

## 2021-06-09 DIAGNOSIS — Z7952 Long term (current) use of systemic steroids: Secondary | ICD-10-CM | POA: Diagnosis not present

## 2021-06-09 DIAGNOSIS — J4520 Mild intermittent asthma, uncomplicated: Secondary | ICD-10-CM | POA: Insufficient documentation

## 2021-06-09 LAB — URINALYSIS, ROUTINE W REFLEX MICROSCOPIC
Bilirubin Urine: NEGATIVE
Glucose, UA: NEGATIVE mg/dL
Ketones, ur: NEGATIVE mg/dL
Nitrite: NEGATIVE
Protein, ur: 100 mg/dL — AB
RBC / HPF: >50 RBC/hpf — ABNORMAL HIGH (ref 0–5)
Specific Gravity, Urine: 1.024 (ref 1.005–1.030)
WBC, UA: >50 WBC/hpf — ABNORMAL HIGH (ref 0–5)
pH: 5 (ref 5.0–8.0)

## 2021-06-09 LAB — CULTURE, URINE COMPREHENSIVE

## 2021-06-09 NOTE — ED Triage Notes (Signed)
Pt reports she was diagnosed with a urinary symptoms since Wednesday, reports urine culture came positive, reports continues to have pain. Pt is moderate discomfort. Pt reports urinary frequency, and "peeing on razor blades"

## 2021-06-10 ENCOUNTER — Emergency Department
Admission: EM | Admit: 2021-06-10 | Discharge: 2021-06-10 | Disposition: A | Discharge status: Home / Self Care | Payer: BC Managed Care – PPO | Attending: Emergency Medicine | Admitting: Emergency Medicine

## 2021-06-10 DIAGNOSIS — R300 Dysuria: Secondary | ICD-10-CM

## 2021-06-10 DIAGNOSIS — N390 Urinary tract infection, site not specified: Secondary | ICD-10-CM

## 2021-06-10 LAB — CBC WITH DIFFERENTIAL/PLATELET
Abs Immature Granulocytes: 0.02 10*3/uL (ref 0.00–0.07)
Basophils Absolute: 0.1 10*3/uL (ref 0.0–0.1)
Basophils Relative: 1 %
Eosinophils Absolute: 0.4 10*3/uL (ref 0.0–0.5)
Eosinophils Relative: 5 %
HCT: 37.2 % (ref 36.0–46.0)
Hemoglobin: 12.1 g/dL (ref 12.0–15.0)
Immature Granulocytes: 0 %
Lymphocytes Relative: 34 %
Lymphs Abs: 2.6 10*3/uL (ref 0.7–4.0)
MCH: 27.5 pg (ref 26.0–34.0)
MCHC: 32.5 g/dL (ref 30.0–36.0)
MCV: 84.5 fL (ref 80.0–100.0)
Monocytes Absolute: 0.8 10*3/uL (ref 0.1–1.0)
Monocytes Relative: 10 %
Neutro Abs: 3.9 10*3/uL (ref 1.7–7.7)
Neutrophils Relative %: 50 %
Platelets: 305 10*3/uL (ref 150–400)
RBC: 4.4 MIL/uL (ref 3.87–5.11)
RDW: 13 % (ref 11.5–15.5)
WBC: 7.7 10*3/uL (ref 4.0–10.5)
nRBC: 0 % (ref 0.0–0.2)

## 2021-06-10 LAB — BASIC METABOLIC PANEL
Anion gap: 7 (ref 5–15)
BUN: 14 mg/dL (ref 6–20)
CO2: 27 mmol/L (ref 22–32)
Calcium: 8.4 mg/dL — ABNORMAL LOW (ref 8.9–10.3)
Chloride: 106 mmol/L (ref 98–111)
Creatinine, Ser: 0.71 mg/dL (ref 0.44–1.00)
GFR, Estimated: >60 mL/min (ref >60)
Glucose, Bld: 115 mg/dL — ABNORMAL HIGH (ref 70–99)
Potassium: 3.3 mmol/L — ABNORMAL LOW (ref 3.5–5.1)
Sodium: 140 mmol/L (ref 135–145)

## 2021-06-10 LAB — LACTIC ACID, PLASMA: Lactic Acid, Venous: 1.1 mmol/L (ref 0.5–1.9)

## 2021-06-10 LAB — PREGNANCY, URINE: Preg Test, Ur: NEGATIVE

## 2021-06-10 MED ORDER — AMOXICILLIN-POT CLAVULANATE 875-125 MG PO TABS: 1.0000 | ORAL_TABLET | Freq: Two times a day (BID) | ORAL | 0 refills | Status: DC

## 2021-06-10 MED ORDER — KETOROLAC TROMETHAMINE 60 MG/2ML IM SOLN
10.0000 mg | Freq: Once | INTRAMUSCULAR | Status: DC
  Filled 2021-06-10: qty 2

## 2021-06-10 MED ORDER — SODIUM CHLORIDE 0.9 % IV BOLUS
1000.0000 mL | Freq: Once | INTRAVENOUS | Status: AC
  Administered 2021-06-10: 1000 mL via INTRAVENOUS

## 2021-06-10 MED ORDER — KETOROLAC TROMETHAMINE 30 MG/ML IJ SOLN
10.0000 mg | Freq: Once | INTRAMUSCULAR | Status: AC
  Administered 2021-06-10: 9.9 mg via INTRAVENOUS
  Filled 2021-06-10: qty 1

## 2021-06-10 MED ORDER — PHENAZOPYRIDINE HCL 200 MG PO TABS: 200.0000 mg | ORAL_TABLET | Freq: Three times a day (TID) | ORAL | 0 refills | Status: DC | PRN

## 2021-06-10 MED ORDER — PHENAZOPYRIDINE HCL 200 MG PO TABS
200.0000 mg | ORAL_TABLET | Freq: Once | ORAL | Status: AC
  Administered 2021-06-10: 200 mg via ORAL
  Filled 2021-06-10: qty 1

## 2021-06-10 MED ORDER — SODIUM CHLORIDE 0.9 % IV SOLN
1.0000 g | Freq: Once | INTRAVENOUS | Status: AC
  Administered 2021-06-10: 1 g via INTRAVENOUS
  Filled 2021-06-10: qty 1

## 2021-06-10 MED ORDER — FLUCONAZOLE 150 MG PO TABS: 150.0000 mg | ORAL_TABLET | Freq: Every day | ORAL | 0 refills | Status: DC

## 2021-06-10 MED ORDER — POTASSIUM CHLORIDE CRYS ER 20 MEQ PO TBCR
40.0000 meq | EXTENDED_RELEASE_TABLET | Freq: Once | ORAL | Status: AC
  Administered 2021-06-10: 40 meq via ORAL
  Filled 2021-06-10: qty 2

## 2021-06-10 MED ORDER — TRAMADOL HCL 50 MG PO TABS: 50.0000 mg | ORAL_TABLET | Freq: Four times a day (QID) | ORAL | 0 refills | Status: DC | PRN

## 2021-06-10 NOTE — ED Notes (Signed)
Pt presents to the ED for burning upon urination, urinary frequency and nausea. Pt states that she saw her Urologist this previous week, states they needed to do an in and out cath but the nurse attempted 6 times. On Wednesday started having this symptoms. Pt states that she was started on antibiotics but on Friday her urine culture came back and MyChart showed that the antibiotic she was on was the resistant to the certain bacteria she had. Pt states that now the pain is become unbearable every time she pees, she tried calling her PCP but they were closed so she came here. Pt is A&Ox4 and NAD.

## 2021-06-10 NOTE — ED Provider Notes (Signed)
[facility] Emergency Department Provider Note   ____________________________________________   Event Date/Time   First MD Initiated Contact with Patient 06/10/21 0421     (approximate)  I have reviewed the triage vital signs and the nursing notes.   HISTORY  Chief Complaint Urinary Frequency    HPI Kimberly Santiago is a 45 y.o. female who presents to the ED from home at the direction of telehealth provider for dysuria and urinary frequency.  Patient reports UTI symptoms since October, seeing [facility] urology.  Has already been on a course of Cipro.  Seen at urology office on 06/06/2021 and started on Ceftin pending urine culture.  States pain on urination is worse so had a telehealth appointment last evening who directed her to the ED for sepsis evaluation. Denies fever, chills, cough, chest pain, shortness of breath, abdominal/flank pain, nausea or vomiting.   Past Medical History:  Diagnosis Date   Anemia    Asthma    Barrett esophagus    Complication of anesthesia    Pt stopped breathing during EGD but did well with recent Gastric Bypass Surgery   Depression    GERD (gastroesophageal reflux disease)    H/O   Headache    H/O MIGRAINES   History of hiatal hernia    SMALL   PCOS (polycystic ovarian syndrome)    PONV (postoperative nausea and vomiting)    Sleep apnea    (mild) PT IS NOT USING CPAP   Status post repeat low transverse cesarean section 07/27/2019  No history of kidney stones  Patient Active Problem List   Diagnosis Date Noted   Overactive bladder 04/01/2021   Urge incontinence 04/01/2021   Stress incontinence, female 04/01/2021   Barrett's esophagus without dysplasia    Abnormal uterine bleeding (AUB) 03/17/2020   Polycystic ovarian syndrome 03/17/2020   OSA (obstructive sleep apnea) 03/09/2020   GERD (gastroesophageal reflux disease)    BMI 45.0-49.9, adult ([place]) 03/17/2018   Seasonal allergic rhinitis 01/12/2018    Anxiety 08/21/2017   Depression 08/21/2017   Postoperative malabsorption 09/19/2015   Status post bariatric surgery 09/27/2014   Mild intermittent asthma without complication [id number]   Insulin resistance 12/20/2013   Thyroid nodule 12/20/2013   H/O thyroid nodule 07/29/2011    Past Surgical History:  Procedure Laterality Date   CESAREAN SECTION N/A 08/18/2017   Procedure: Primary CESAREAN SECTION;  Surgeon: Aloha Gell, MD;  Location: [place];  Service: Obstetrics;  Laterality: N/A;  EDD: 09/13/17 Allergy: Amoxicillin, Morphine, Hydrocodone   CESAREAN SECTION WITH BILATERAL TUBAL LIGATION Bilateral 07/27/2019   Procedure: Repeat CESAREAN SECTION WITH BILATERAL TUBAL LIGATION;  Surgeon: Aloha Gell, MD;  Location: [place] [facility];  Service: Obstetrics;  Laterality: Bilateral;  EDD: 07/31/19   CHOLECYSTECTOMY     COLONOSCOPY WITH PROPOFOL N/A 07/07/2020   Procedure: COLONOSCOPY WITH PROPOFOL;  Surgeon: Lucilla Lame, MD;  Location: [place];  Service: Endoscopy;  Laterality: N/A;   DIAGNOSTIC LAPAROSCOPY     EAR CYST EXCISION N/A 11/24/2014   Procedure: CYST REMOVAL;  Surgeon: Carloyn Manner, MD;  Location: [facility];  Service: ENT;  Laterality: N/A;  upper lip   ESOPHAGOGASTRODUODENOSCOPY     ESOPHAGOGASTRODUODENOSCOPY (EGD) WITH PROPOFOL N/A 07/07/2020   Procedure: ESOPHAGOGASTRODUODENOSCOPY (EGD) WITH PROPOFOL;  Surgeon: Lucilla Lame, MD;  Location: [place];  Service: Endoscopy;  Laterality: N/A;  sleep apnea   ETHMOIDECTOMY Right 05/07/2018   Procedure: ETHMOIDECTOMY;  Surgeon: Carloyn Manner, MD;  Location: [facility];  Service:  ENT;  Laterality: Right;   FRONTAL SINUS EXPLORATION Right 05/07/2018   Procedure: FRONTAL SINUS EXPLORATION;  Surgeon: Carloyn Manner, MD;  Location: [facility];  Service: ENT;  Laterality: Right;   GASTRIC BYPASS  March 2016   IMAGE GUIDED SINUS SURGERY N/A 05/07/2018   Procedure: IMAGE GUIDED SINUS SURGERY;  Surgeon: Carloyn Manner, MD;  Location: [facility];  Service: ENT;  Laterality: N/A;   KNEE ARTHROSCOPY Right    MAXILLARY ANTROSTOMY Bilateral 05/07/2018   Procedure: MAXILLARY ANTROSTOMY;  Surgeon: Carloyn Manner, MD;  Location: [facility];  Service: ENT;  Laterality: Bilateral;   NASAL SEPTOPLASTY W/ TURBINOPLASTY Bilateral 05/07/2018   Procedure: NASAL SEPTOPLASTY WITH TURBINATE REDUCTION;  Surgeon: Carloyn Manner, MD;  Location: [facility];  Service: ENT;  Laterality: Bilateral;   SPHENOIDECTOMY Right 05/07/2018   Procedure: Coralee Pesa;  Surgeon: Carloyn Manner, MD;  Location: [facility];  Service: ENT;  Laterality: Right;   TEMPOROMANDIBULAR JOINT SURGERY     WISDOM TOOTH EXTRACTION      Prior to Admission medications   Medication Sig Start Date End Date Taking? Authorizing Provider  amoxicillin-clavulanate (AUGMENTIN) 875-125 MG tablet Take 1 tablet by mouth 2 (two) times daily. 06/10/21  Yes Paulette Blanch, MD  fluconazole (DIFLUCAN) 150 MG tablet Take 1 tablet (150 mg total) by mouth daily. 06/10/21  Yes Paulette Blanch, MD  phenazopyridine (PYRIDIUM) 200 MG tablet Take 1 tablet (200 mg total) by mouth 3 (three) times daily as needed for pain. 06/10/21  Yes Paulette Blanch, MD  traMADol (ULTRAM) 50 MG tablet Take 1 tablet (50 mg total) by mouth every 6 (six) hours as needed. 06/10/21  Yes Paulette Blanch, MD  acetaminophen (TYLENOL) 325 MG tablet Take 650 mg by mouth every 6 (six) hours as needed for mild pain or headache.    [provider]  busPIRone (BUSPAR) 7.5 MG tablet Take 7.5 mg by mouth at bedtime.    [provider]  cefUROXime (CEFTIN) 500 MG tablet Take 1 tablet (500 mg total) by mouth 2 (two) times daily with a meal. 06/06/21   McGowan, Larene Beach A, PA-C  citalopram (CELEXA) 40 MG tablet TAKE 1 TABLET BY MOUTH EVERYDAY AT BEDTIME 05/18/21   Johnson, Megan P, DO  cyclobenzaprine (FLEXERIL) 10 MG tablet Take 1 tablet (10 mg total) by mouth at bedtime. 05/29/21   Johnson, Megan P, DO   diclofenac Sodium (VOLTAREN) 1 % GEL Apply 4 g topically 4 (four) times daily. 05/29/21   Johnson, Megan P, DO  famotidine (PEPCID) 20 MG tablet Take 1 tablet (20 mg total) by mouth at bedtime. 09/18/20   Johnson, Megan P, DO  fexofenadine (ALLEGRA) 180 MG tablet Take 180 mg by mouth daily.    [provider]  gabapentin (NEURONTIN) 100 MG capsule Take 1 capsule (100 mg total) by mouth 3 (three) times daily. 05/29/21   Park Liter P, DO  JUNEL 1/20 1-20 MG-MCG tablet Take 1 tablet by mouth daily. 11/17/20   [provider]  nystatin (MYCOSTATIN/NYSTOP) powder Apply 1 application topically 3 (three) times daily. 09/18/20   Johnson, Megan P, DO  pantoprazole (PROTONIX) 40 MG tablet TAKE 1 TABLET BY MOUTH TWICE A DAY 03/26/21   Johnson, Megan P, DO  scopolamine (TRANSDERM-SCOP, 1.5 MG,) 1 MG/3DAYS Place 1 patch (1.5 mg total) onto the skin every 3 (three) days. 02/06/21   Johnson, Megan P, DO  fluticasone (FLONASE) 50 MCG/ACT nasal spray Place 2 sprays into both nostrils at bedtime.  05/15/20  [provider]    Allergies Hydromorphone, Hydrocodone, Morphine and related, Nsaids, and Amoxicillin  Family History  Problem Relation Age of Onset   Breast cancer Paternal Aunt        pat great aunt   Heart disease Maternal Grandmother    Heart disease Maternal Grandfather    Heart disease Paternal Grandmother    Heart disease Paternal Grandfather    Anxiety disorder Mother    Depression Mother    Miscarriages / [place] Sister     Social History Social History   Tobacco Use   Smoking status: Never   Smokeless tobacco: Never  Vaping Use   Vaping Use: Never used  Substance Use Topics   Alcohol use: Not Currently   Drug use: No    Review of Systems  Constitutional: No fever/chills Eyes: No visual changes. ENT: No sore throat. Cardiovascular: Denies chest pain. Respiratory: Denies shortness of breath. Gastrointestinal: No abdominal pain.  No nausea, no  vomiting.  No diarrhea.  No constipation. Genitourinary: Positive for frequency and dysuria. Musculoskeletal: Negative for back pain. Skin: Negative for rash. Neurological: Negative for headaches, focal weakness or numbness.   ____________________________________________   PHYSICAL EXAM:  VITAL SIGNS: ED Triage Vitals  Enc Vitals Group     BP 06/09/21 2312 125/73     Pulse Rate 06/09/21 2312 62     Resp 06/09/21 2312 20     Temp 06/09/21 2312 97.6 F (36.4 C)     Temp Source 06/09/21 2312 Oral     SpO2 06/09/21 2312 96 %     Weight 06/09/21 2231 (!) 305 lb (138.3 kg)     Height 06/09/21 2231 5\' 7"  (1.702 m)     Head Circumference --      Peak Flow --      Pain Score 06/09/21 2231 10     Pain Loc --      Pain Edu? --      Excl. in GC? --     Constitutional: Alert and oriented. Well appearing and in mild acute distress. Eyes: Conjunctivae are normal. PERRL. EOMI. Head: Atraumatic. Nose: No congestion/rhinnorhea. Mouth/Throat: Mucous membranes are moist.   Neck: No stridor.   Cardiovascular: Normal rate, regular rhythm. Grossly normal heart sounds.  Good peripheral circulation. Respiratory: Normal respiratory effort.  No retractions. Lungs CTAB. Gastrointestinal: Soft and nontender to light or deep palpation. No distention. No abdominal bruits. No CVA tenderness. Musculoskeletal: No lower extremity tenderness nor edema.  No joint effusions. Neurologic:  Normal speech and language. No gross focal neurologic deficits are appreciated. No gait instability. Skin:  Skin is warm, dry and intact. No rash noted.  No vesicles. Psychiatric: Mood and affect are normal. Speech and behavior are normal.  ____________________________________________   LABS (all labs ordered are listed, but only abnormal results are displayed)  Labs Reviewed  URINALYSIS, ROUTINE W REFLEX MICROSCOPIC - Abnormal; Notable for the following components:      Result Value   Color, Urine YELLOW (*)     APPearance CLOUDY (*)    Hgb urine dipstick LARGE (*)    Protein, ur 100 (*)    Leukocytes,Ua LARGE (*)    RBC / HPF >50 (*)    WBC, UA >50 (*)    Bacteria, UA FEW (*)    All other components within normal limits  BASIC METABOLIC PANEL - Abnormal; Notable for the following components:   Potassium 3.3 (*)    Glucose, Bld 115 (*)    Calcium  8.4 (*)    All other components within normal limits  CULTURE, BLOOD (ROUTINE X 2)  CULTURE, BLOOD (ROUTINE X 2)  URINE CULTURE  PREGNANCY, URINE  CBC WITH DIFFERENTIAL/PLATELET  LACTIC ACID, PLASMA   ____________________________________________  EKG  None ____________________________________________  RADIOLOGY I, Delia Slatten J, personally viewed and evaluated these images (plain radiographs) as part of my medical decision making, as well as reviewing the written report by the radiologist.  ED MD interpretation: None  Official radiology report(s): No results found.  ____________________________________________   PROCEDURES  Procedure(s) performed (including Critical Care):  Procedures   ____________________________________________   INITIAL IMPRESSION / ASSESSMENT AND PLAN / ED COURSE  As part of my medical decision making, I reviewed the following data within the [place] notes reviewed and incorporated, Labs reviewed, Old chart reviewed, and Notes from prior ED visits     45 year old female presenting with urinary frequency and dysuria. Differential diagnosis includes, but is not limited to, ovarian cyst, ovarian torsion, acute appendicitis, diverticulitis, urinary tract infection/pyelonephritis, endometriosis, bowel obstruction, colitis, renal colic, gastroenteritis, hernia, fibroids, endometriosis, pregnancy related pain including ectopic pregnancy, etc.   I have personally reviewed patient's urology office visit from 06/06/2021 as well as her urine culture results from that date.  Culture grew out  E. coli which is sensitive to Augmentin as well as Nitrofurantoin.  We discussed risk/benefits of both antibiotics; patient willing to take Augmentin as she took amoxicillin during pregnancy and did not have any reaction.  She is also willing to take a one-time, low-dose IV Toradol for discomfort as she is driving and cannot take narcotics (usually does not like to take NSAIDs due to her prior gastric bypass).  Patient does state that she can take Tramadol so we will consider this at discharge.  Given her concern for sepsis, will obtain blood cultures, lab work including lactic acid.  Will administer IV Meropenem and reassess.  Clinical Course as of 06/10/21 [id number]  Sun Jun 10, 2021  0549 Patient resting in no acute distress.  Updated her on all test results.  Will discharge home once IV antibiotics and fluids are completed.  Will discharge home with prescriptions for Augmentin, Pyridium, Tramadol to use as needed, and one-time Diflucan dose if she develops a yeast infection.  Strict return precautions given.  Patient verbalizes understanding and agrees with plan of care. [JS]    Clinical Course User Index [JS] Paulette Blanch, MD     ____________________________________________   FINAL CLINICAL IMPRESSION(S) / ED DIAGNOSES  Final diagnoses:  Lower urinary tract infectious disease  Dysuria     ED Discharge Orders          Ordered    fluconazole (DIFLUCAN) 150 MG tablet  Daily        06/10/21 0435    amoxicillin-clavulanate (AUGMENTIN) 875-125 MG tablet  2 times daily        06/10/21 0435    phenazopyridine (PYRIDIUM) 200 MG tablet  3 times daily PRN        06/10/21 0435    traMADol (ULTRAM) 50 MG tablet  Every 6 hours PRN        06/10/21 0436             Note:  This document was prepared using Dragon voice recognition software and may include unintentional dictation errors.    Paulette Blanch, MD 06/10/21 [phone]

## 2021-06-10 NOTE — Progress Notes (Signed)
PHARMACY -  BRIEF ANTIBIOTIC NOTE   Pharmacy has received consult(s) for Meropenem from an ED provider.  The patient's profile has been reviewed for ht/wt/allergies/indication/available labs.    One time order(s) placed for meropenem 1 gm IV X 1   Further antibiotics/pharmacy consults should be ordered by admitting physician if indicated.                       Thank you, Yoali Conry D 06/10/2021  4:37 AM

## 2021-06-10 NOTE — Discharge Instructions (Addendum)
1.  Stop Ceftin.  Instead start Augmentin 875 mg twice daily x7 days. 2.  You may take Pyridium 3 times daily for urinary discomfort. 3.  You may take Tramadol as needed for more severe pain. 4.  Return to the ER for worsening symptoms, persistent vomiting, fever, difficulty breathing or other concerns.

## 2021-06-12 LAB — URINE CULTURE: Culture: >=100000 — AB

## 2021-06-13 ENCOUNTER — Other Ambulatory Visit: Payer: Self-pay | Admitting: Family Medicine

## 2021-06-13 NOTE — Progress Notes (Addendum)
ED Antimicrobial Stewardship Positive Culture Follow Up   Kimberly Santiago is an 45 y.o. female who presented to [facility] on 06/10/2021 with a chief complaint of  Chief Complaint  Patient presents with   Urinary Frequency    Recent Results (from the past 720 hour(s))  Microscopic Examination     Status: Abnormal   Collection Time: 06/06/21 10:23 AM   Urine  Result Value Ref Range Status   WBC, UA 11-30 (A) 0 - 5 /hpf Final   RBC >30 (H) 0 - 2 /hpf Final   Epithelial Cells (non renal) 0-10 0 - 10 /hpf Final   Bacteria, UA Few (A) None seen/Few Final  CULTURE, URINE COMPREHENSIVE     Status: Abnormal   Collection Time: 06/06/21 11:38 AM   Specimen: Urine   UR  Result Value Ref Range Status   Urine Culture, Comprehensive Final report (A)  Final   Organism ID, Bacteria Escherichia coli (A)  Final    Comment: Multi-Drug Resistant Organism Susceptibility profile is consistent with a probable ESBL. Greater than 100,000 colony forming units per mL    ANTIMICROBIAL SUSCEPTIBILITY Comment  Final    Comment:       ** S = Susceptible; I = Intermediate; R = Resistant **                    P = Positive; N = Negative             MICS are expressed in micrograms per mL    Antibiotic                 RSLT#1    RSLT#2    RSLT#3    RSLT#4 Amoxicillin/Clavulanic Acid    S Ampicillin                     R Cefazolin                      R Cefepime                       R Ceftriaxone                    R Cefuroxime                     R Ciprofloxacin                  R Ertapenem                      S Gentamicin                     S Imipenem                       S Levofloxacin                   R Meropenem                      S Nitrofurantoin                 S Piperacillin/Tazobactam        S Tetracycline                   R Tobramycin  S Trimethoprim/Sulfa             R   Urine Culture     Status: Abnormal   Collection Time: 06/09/21 10:35 PM   Specimen: Urine,  Random  Result Value Ref Range Status   Specimen Description   Final    URINE, RANDOM Performed at [facility], [address] [place], [place] [address]    Special Requests   Final    NONE Performed at [facility], [address] [place], [place] [address]    Culture (A)  Final    >=100,000 COLONIES/mL ESCHERICHIA COLI Confirmed Extended Spectrum Beta-Lactamase Producer (ESBL).  In bloodstream infections from ESBL organisms, carbapenems are preferred over piperacillin/tazobactam. They are shown to have a lower risk of mortality. >=100,000 COLONIES/mL DIPHTHEROIDS(CORYNEBACTERIUM SPECIES) Standardized susceptibility testing for this organism is not available. Performed at [facility], [address] [place], [place] [address]    Report Status 06/12/2021 FINAL  Final   Organism ID, Bacteria ESCHERICHIA COLI (A)  Final      Susceptibility   Escherichia coli - MIC*    AMPICILLIN >=32 RESISTANT Resistant     CEFAZOLIN >=64 RESISTANT Resistant     CEFEPIME 16 RESISTANT Resistant     CEFTRIAXONE >=64 RESISTANT Resistant     CIPROFLOXACIN >=4 RESISTANT Resistant     GENTAMICIN <=1 SENSITIVE Sensitive     IMIPENEM <=0.25 SENSITIVE Sensitive     NITROFURANTOIN 64 INTERMEDIATE Intermediate     TRIMETH/SULFA >=320 RESISTANT Resistant     AMPICILLIN/SULBACTAM >=32 RESISTANT Resistant     PIP/TAZO <=4 SENSITIVE Sensitive     * >=100,000 COLONIES/mL ESCHERICHIA COLI  Culture, blood (routine x 2)     Status: None (Preliminary result)   Collection Time: 06/10/21  4:51 AM   Specimen: BLOOD  Result Value Ref Range Status   Specimen Description BLOOD RIGHT ANTECUBITAL  Final   Special Requests   Final    BOTTLES DRAWN AEROBIC AND ANAEROBIC Blood Culture results may not be optimal due to an excessive volume of blood received in culture bottles   Culture   Final    NO GROWTH 3 DAYS Performed at [facility], [address] [place], [place] [address]     Report Status PENDING  Incomplete  Culture, blood (routine x 2)     Status: None (Preliminary result)   Collection Time: 06/10/21  4:52 AM   Specimen: BLOOD  Result Value Ref Range Status   Specimen Description BLOOD BLOOD RIGHT FOREARM  Final   Special Requests   Final    BOTTLES DRAWN AEROBIC AND ANAEROBIC Blood Culture results may not be optimal due to an excessive volume of blood received in culture bottles   Culture   Final    NO GROWTH 3 DAYS Performed at [facility], [address], [place], [place] [address]    Report Status PENDING  Incomplete    [x]  Treated with Augmentin, organism possibly resistant to prescribed antimicrobial  New antibiotic prescription: Fosfomycin 3 g PO x 1  ED Provider: Dr.  Comments: Noted Diptheroids which are likely a contaminant. Spoke with patient via telephone regarding results. States symptoms have improved but still lingering. Has follow-up with urology next week. Difficult to interpret Ucx results as only reported for Ampicillin / Ampicillin-Sulbactam at the hospital and outpatient clinic reported Augmentin S on previous urine culture. Note nitrofurantoin was reported S on outpatient culture and I on inpatient testing. This  was discussed with patient and she understands Augmentin may be active against this strain. New prescription for fosfomycin called into patient's pharmacy per her request.    [name] 06/13/2021, 7:40 PM

## 2021-06-13 NOTE — Telephone Encounter (Signed)
Requested Prescriptions  Pending Prescriptions Disp Refills   citalopram (CELEXA) 40 MG tablet [Pharmacy Med Name: CITALOPRAM HBR 40 MG TABLET] 90 tablet 0    Sig: TAKE 1 TABLET BY MOUTH EVERYDAY AT BEDTIME     Psychiatry:  Antidepressants - SSRI Passed - 06/13/2021  1:36 AM      Passed - Completed PHQ-2 or PHQ-9 in the last 360 days      Passed - Valid encounter within last 6 months    Recent Outpatient Visits          2 weeks ago Left hip pain   [facility] [place], Megan P, DO   4 months ago Cough   [facility] [place], Megan P, DO   8 months ago Routine general medical examination at a health care facility   [facility] [place], [place], DO   1 year ago Chronic fatigue   [facility] [place], [place], DO   1 year ago Left hip pain   [facility] [place], [place]      Future Appointments            In 1 week Laural Benes, Oralia Rud, DO [employer], [facility]

## 2021-06-14 ENCOUNTER — Encounter: Payer: Self-pay | Admitting: Urology

## 2021-06-18 ENCOUNTER — Other Ambulatory Visit: Payer: Self-pay

## 2021-06-18 ENCOUNTER — Ambulatory Visit (INDEPENDENT_AMBULATORY_CARE_PROVIDER_SITE_OTHER): Payer: BC Managed Care – PPO | Admitting: Urology

## 2021-06-18 DIAGNOSIS — N3946 Mixed incontinence: Secondary | ICD-10-CM | POA: Diagnosis not present

## 2021-06-18 DIAGNOSIS — N3941 Urge incontinence: Secondary | ICD-10-CM | POA: Diagnosis not present

## 2021-06-18 LAB — URINALYSIS, COMPLETE
Bilirubin, UA: NEGATIVE
Glucose, UA: NEGATIVE
Leukocytes,UA: NEGATIVE
Nitrite, UA: NEGATIVE
Protein,UA: NEGATIVE
RBC, UA: NEGATIVE
Specific Gravity, UA: 1.025 (ref 1.005–1.030)
Urobilinogen, Ur: 0.2 mg/dL (ref 0.2–1.0)
pH, UA: 5.5 (ref 5.0–7.5)

## 2021-06-18 LAB — MICROSCOPIC EXAMINATION
Bacteria, UA: NONE SEEN
Epithelial Cells (non renal): >10 /hpf — ABNORMAL HIGH (ref 0–10)
RBC, Urine: NONE SEEN /hpf (ref 0–2)
WBC, UA: NONE SEEN /hpf (ref 0–5)

## 2021-06-18 MED ORDER — NITROFURANTOIN MACROCRYSTAL 100 MG PO CAPS: 100.0000 mg | ORAL_CAPSULE | Freq: Every day | ORAL | 11 refills | Status: DC

## 2021-06-18 NOTE — Progress Notes (Signed)
06/18/2021 3:17 PM   Kimberly Santiago 1975-09-16 [id number]  Referring provider: Valerie Roys, DO [place],  [place] [id number]  No chief complaint on file.   HPI: Kimberly Santiago: Has been on Toviaz but stopped due to dry mouth.  Residual 477 mL.  Sleep apnea.   For years patient leaks with coughing sneezing bending lifting.  Her urgency incontinence is worsening lately.  She can void 4 times an hour.  She has difficulty holding it for 1 hour and cannot hold it for 2 hours.  She goes because of her urgency pressure and fullness and not due to pain.  She wears 4 pads a day or more moderately wet.  She wears a pad at night she thinks it a bit wet.  Some of the details were more challenging  She gets at least 3 times a night.  Has ankle edema but does not take a diuretic  She is failed [place].  No hysterectomy  On pelvic examination she had a very well supported bladder neck.  No stress incontinence prolapse.  She states she tends to be anxious but did very well   She was bladder scanned today for 420 mL    The role of urodynamics was discussed for mixed incontinence and incomplete bladder emptying.  She may have an element of enuresis.  She has severe frequency. She has a very high residual which is out of the ordinary for her age.  Renal ultrasound ordered to rule out silent hydronephrosis.  Perform cystoscopy next visit  Today Frequency stable.  Incontinence stable. When I saw her November 14 she did have Streptococcus in the urine.  On December 10 she had E. coli in the urine.  Blood cultures were normal.  She saw a nurse practitioner in December 7.  Apparently they had trouble getting the catheter in during the urodynamics.  She went to her gynecologist with urgency frequency and pain and some blood with wiping.  Her residual during that visit was 180 mL.  She did have microscopic hematuria and a few bacteria.  Her renal ultrasound demonstrated normal kidneys and a  postvoid residual 33 mL.  She had a 10 to 11 cm ovarian cyst on the right side.  She was given Keflex.  Culture was resistant to the Keflex.  On urodynamics patient voided 85 mL with a maximum flow of 10 mils per second.  Residual was 10 mL.  Maximum bladder capacity was 186 mL.  She had increased bladder sensation.  The bladder was stable.  Initially there was artifact and she was refilled.  She had no stress incontinence with a Valsalva pressure of 101 cm of water.  During the second void she voided 158 mL with a maximal flow of 8 mils per second.  Maximum voiding pressure 35 cm water.  Flow rate was 8 mils per second.  Residual was 31 mL.  EMG activity increased during voiding.  Bladder neck descended approximately 1 cm.  The patient now is on Augmentin.  She was nervous to do cystoscopy and of the circumstances I thought it was okay not to proceed.  It can always be reconsidered in the future.  Her bacteria was resistant to many antibiotics but intermediate to Macrodantin.  There was no sensitivity on Augmentin.  It turns out she actually got fosfomycin in the emergency room  Urine looked normal today but is sent for culture.   PMH: Past Medical History:  Diagnosis Date  Anemia    Asthma    Barrett esophagus    Complication of anesthesia    Pt stopped breathing during EGD but did well with recent Gastric Bypass Surgery   Depression    GERD (gastroesophageal reflux disease)    H/O   Headache    H/O MIGRAINES   History of hiatal hernia    SMALL   PCOS (polycystic ovarian syndrome)    PONV (postoperative nausea and vomiting)    Sleep apnea    (mild) PT IS NOT USING CPAP   Status post repeat low transverse cesarean section 07/27/2019    Surgical History: Past Surgical History:  Procedure Laterality Date   CESAREAN SECTION N/A 08/18/2017   Procedure: Primary CESAREAN SECTION;  Surgeon: Noland Fordyce, MD;  Location: [facility];  Service: Obstetrics;  Laterality: N/A;  EDD:  09/13/17 Allergy: Amoxicillin, Morphine, Hydrocodone   CESAREAN SECTION WITH BILATERAL TUBAL LIGATION Bilateral 07/27/2019   Procedure: Repeat CESAREAN SECTION WITH BILATERAL TUBAL LIGATION;  Surgeon: Noland Fordyce, MD;  Location: [facility];  Service: Obstetrics;  Laterality: Bilateral;  EDD: 07/31/19   CHOLECYSTECTOMY     COLONOSCOPY WITH PROPOFOL N/A 07/07/2020   Procedure: COLONOSCOPY WITH PROPOFOL;  Surgeon: Midge Minium, MD;  Location: [facility];  Service: Endoscopy;  Laterality: N/A;   DIAGNOSTIC LAPAROSCOPY     EAR CYST EXCISION N/A 11/24/2014   Procedure: CYST REMOVAL;  Surgeon: Bud Face, MD;  Location: [facility] ORS;  Service: ENT;  Laterality: N/A;  upper lip   ESOPHAGOGASTRODUODENOSCOPY     ESOPHAGOGASTRODUODENOSCOPY (EGD) WITH PROPOFOL N/A 07/07/2020   Procedure: ESOPHAGOGASTRODUODENOSCOPY (EGD) WITH PROPOFOL;  Surgeon: Midge Minium, MD;  Location: [facility];  Service: Endoscopy;  Laterality: N/A;  sleep apnea   ETHMOIDECTOMY Right 05/07/2018   Procedure: ETHMOIDECTOMY;  Surgeon: Bud Face, MD;  Location: [facility] ORS;  Service: ENT;  Laterality: Right;   FRONTAL SINUS EXPLORATION Right 05/07/2018   Procedure: FRONTAL SINUS EXPLORATION;  Surgeon: Bud Face, MD;  Location: [facility] OR[facility];  Service: ENT;  Laterality: Right;   GASTRIC BYPASS  March 2016   IMAGE GUIDED SINUS SURGERY N/A 05/07/2018   Procedure: IMAGE GUIDED SINUS SURGERY;  Surgeon: Bud Face, MD;  Location: [facility] ORS;  Service: ENT;  Laterality: N/A;   KNEE ARTHROSCOPY Right    MAXILLARY ANTROSTOMY Bilateral 05/07/2018   Procedure: MAXILLARY ANTROSTOMY;  Surgeon: Bud Face, MD;  Location: [facility] OR[facility];  Service: ENT;  Laterality: Bilateral;   NASAL SEPTOPLASTY W/ TURBINOPLASTY Bilateral 05/07/2018   Procedure: NASAL SEPTOPLASTY WITH TURBINATE REDUCTION;  Surgeon: Bud Face, MD;  Location: [facility] OR[facility];  Service: ENT;  Laterality: Bilateral;   SPHENOIDECTOMY Right 05/07/2018    Procedure: Selina Cooley;  Surgeon: Bud Face, MD;  Location: [facility] ORS;  Service: ENT;  Laterality: Right;   TEMPOROMANDIBULAR JOINT SURGERY     WISDOM TOOTH EXTRACTION      Home Medications:  Allergies as of 06/18/2021       Reactions   Hydromorphone Shortness Of Breath, Nausea And Vomiting, Other (See Comments)   Chest pain   Hydrocodone Nausea And Vomiting, Other (See Comments)   Headache, Patient can take oxycodone.   Morphine And Related Nausea And Vomiting, Other (See Comments)   Headache   Nsaids Other (See Comments)   Pt cannot take due to Gastric Bypass Surgery   Amoxicillin Itching   Can take cephalexin. Did it involve swelling of the face/tongue/throat, SOB, or low BP? No Did it involve sudden or severe rash/hives, skin peeling, or any  reaction on the inside of your mouth or nose? No Did you need to seek medical attention at a hospital or doctor's office? No When did it last happen?  Several years     If all above answers are NO, may proceed with cephalosporin use.        Medication List        Accurate as of June 18, 2021  3:17 PM. If you have any questions, ask your nurse or doctor.          acetaminophen 325 MG tablet Commonly known as: TYLENOL Take 650 mg by mouth every 6 (six) hours as needed for mild pain or headache.   amoxicillin-clavulanate 875-125 MG tablet Commonly known as: Augmentin Take 1 tablet by mouth 2 (two) times daily.   busPIRone 7.5 MG tablet Commonly known as: BUSPAR Take 7.5 mg by mouth at bedtime.   cefUROXime 500 MG tablet Commonly known as: CEFTIN Take 1 tablet (500 mg total) by mouth 2 (two) times daily with a meal.   citalopram 40 MG tablet Commonly known as: CELEXA TAKE 1 TABLET BY MOUTH EVERYDAY AT BEDTIME   cyclobenzaprine 10 MG tablet Commonly known as: FLEXERIL Take 1 tablet (10 mg total) by mouth at bedtime.   diclofenac Sodium 1 % Gel Commonly known as: Voltaren Apply 4 g topically 4 (four)  times daily.   famotidine 20 MG tablet Commonly known as: PEPCID Take 1 tablet (20 mg total) by mouth at bedtime.   fexofenadine 180 MG tablet Commonly known as: ALLEGRA Take 180 mg by mouth daily.   fluconazole 150 MG tablet Commonly known as: DIFLUCAN Take 1 tablet (150 mg total) by mouth daily.   gabapentin 100 MG capsule Commonly known as: NEURONTIN Take 1 capsule (100 mg total) by mouth 3 (three) times daily.   Junel 1/20 1-20 MG-MCG tablet Generic drug: norethindrone-ethinyl estradiol Take 1 tablet by mouth daily.   nystatin powder Commonly known as: MYCOSTATIN/NYSTOP Apply 1 application topically 3 (three) times daily.   pantoprazole 40 MG tablet Commonly known as: PROTONIX TAKE 1 TABLET BY MOUTH TWICE A DAY   phenazopyridine 200 MG tablet Commonly known as: Pyridium Take 1 tablet (200 mg total) by mouth 3 (three) times daily as needed for pain.   scopolamine 1 MG/3DAYS Commonly known as: Transderm-Scop (1.5 MG) Place 1 patch (1.5 mg total) onto the skin every 3 (three) days.   traMADol 50 MG tablet Commonly known as: Ultram Take 1 tablet (50 mg total) by mouth every 6 (six) hours as needed.        Allergies:  Allergies  Allergen Reactions   Hydromorphone Shortness Of Breath, Nausea And Vomiting and Other (See Comments)    Chest pain   Hydrocodone Nausea And Vomiting and Other (See Comments)    Headache, Patient can take oxycodone.   Morphine And Related Nausea And Vomiting and Other (See Comments)    Headache   Nsaids Other (See Comments)    Pt cannot take due to Gastric Bypass Surgery   Amoxicillin Itching    Can take cephalexin. Did it involve swelling of the face/tongue/throat, SOB, or low BP? No Did it involve sudden or severe rash/hives, skin peeling, or any reaction on the inside of your mouth or nose? No Did you need to seek medical attention at a hospital or doctor's office? No When did it last happen?  Several years     If all above  answers are NO, may proceed with cephalosporin use.  Family History: Family History  Problem Relation Age of Onset   Breast cancer Paternal Aunt        pat great aunt   Heart disease Maternal Grandmother    Heart disease Maternal Grandfather    Heart disease Paternal Grandmother    Heart disease Paternal Grandfather    Anxiety disorder Mother    Depression Mother    Miscarriages / [place] Sister     Social History:  reports that she has never smoked. She has never used smokeless tobacco. She reports that she does not currently use alcohol. She reports that she does not use drugs.  ROS:                                        Physical Exam: There were no vitals taken for this visit.  Constitutional:  Alert and oriented, No acute distress. HEENT: [place] AT, moist mucus membranes.  Trachea midline, no masses.   Laboratory Data: Lab Results  Component Value Date   WBC 7.7 06/10/2021   HGB 12.1 06/10/2021   HCT 37.2 06/10/2021   MCV 84.5 06/10/2021   PLT 305 06/10/2021    Lab Results  Component Value Date   CREATININE 0.71 06/10/2021    No results found for: PSA  No results found for: TESTOSTERONE  Lab Results  Component Value Date   HGBA1C 4.8 03/09/2020    Urinalysis    Component Value Date/Time   COLORURINE YELLOW (A) 06/09/2021 2235   APPEARANCEUR CLOUDY (A) 06/09/2021 2235   APPEARANCEUR Cloudy (A) 06/06/2021 1023   LABSPEC 1.024 06/09/2021 2235   LABSPEC 1.023 01/03/2014 0019   PHURINE 5.0 06/09/2021 2235   GLUCOSEU NEGATIVE 06/09/2021 2235   GLUCOSEU Negative 01/03/2014 0019   HGBUR LARGE (A) 06/09/2021 2235   BILIRUBINUR NEGATIVE 06/09/2021 2235   BILIRUBINUR Negative 06/06/2021 1023   BILIRUBINUR Negative 01/03/2014 0019   KETONESUR NEGATIVE 06/09/2021 2235   PROTEINUR 100 (A) 06/09/2021 2235   UROBILINOGEN 0.2 12/20/2020 1550   NITRITE NEGATIVE 06/09/2021 2235   LEUKOCYTESUR LARGE (A) 06/09/2021 2235    LEUKOCYTESUR Negative 01/03/2014 0019    Pertinent Imaging:   Assessment & Plan: Patient has mixed incontinence but primarily an overactive bladder and is failed 2 medications.  Although it is uncommon I think that her her large pelvic cyst could be aggravating her bladder function.  I The presumed elevated residual was a false positive due to pelvic cyst  We agreed on the following plan.  Urine sent for culture to make certain infection is cleared.  At least for 6 months put patient on Macrodantin 100 mg 3x11.  She is going to have gynecology surgery and hopefully this will greatly improve her mixed incontinence.  I do not want to try another medication to get a false negative at this stage.  I would not be surprised that the pelvic surgery actually helps her bladder dysfunction recognizing this is less common.  Call if culture positive.  Reassess in 4 months.  Questions answered  I agree that her mixed incontinence is more chronic but again I recommend the above strategy  1. Urge incontinence  - Urinalysis, Complete   No follow-ups on file.  Martina Sinner, MD  [facility] [address], [address] [place], [place] [address] [phone]

## 2021-06-20 DIAGNOSIS — M545 Low back pain, unspecified: Secondary | ICD-10-CM | POA: Insufficient documentation

## 2021-06-20 DIAGNOSIS — M7918 Myalgia, other site: Secondary | ICD-10-CM | POA: Diagnosis not present

## 2021-06-20 DIAGNOSIS — S39012A Strain of muscle, fascia and tendon of lower back, initial encounter: Secondary | ICD-10-CM | POA: Diagnosis not present

## 2021-06-20 LAB — CULTURE, BLOOD (ROUTINE X 2)
Culture: NO GROWTH
Culture: NO GROWTH

## 2021-06-24 LAB — CULTURE, URINE COMPREHENSIVE

## 2021-06-26 ENCOUNTER — Ambulatory Visit: Payer: BC Managed Care – PPO | Admitting: Family Medicine

## 2021-07-03 ENCOUNTER — Ambulatory Visit
Admission: RE | Admit: 2021-07-03 | Discharge: 2021-07-03 | Disposition: A | Discharge status: Home / Self Care | Payer: BC Managed Care – PPO | Source: Ambulatory Visit | Attending: Family Medicine | Admitting: Family Medicine

## 2021-07-03 ENCOUNTER — Encounter: Payer: Self-pay | Admitting: Family Medicine

## 2021-07-03 ENCOUNTER — Other Ambulatory Visit: Payer: Self-pay

## 2021-07-03 DIAGNOSIS — M5416 Radiculopathy, lumbar region: Secondary | ICD-10-CM | POA: Diagnosis not present

## 2021-07-03 DIAGNOSIS — M79605 Pain in left leg: Secondary | ICD-10-CM

## 2021-07-03 DIAGNOSIS — M5136 Other intervertebral disc degeneration, lumbar region: Secondary | ICD-10-CM | POA: Diagnosis not present

## 2021-07-03 DIAGNOSIS — M4726 Other spondylosis with radiculopathy, lumbar region: Secondary | ICD-10-CM | POA: Diagnosis not present

## 2021-07-03 IMAGING — MR MR LUMBAR SPINE W/O CM
5 series · 32 of 48 positions shown · non-contrast
Comparison: [DATE] lumbar spine radiograph

CLINICAL DATA: Lumbar radiculopathy, symptoms persist with > 6 wks
treatment lumbar DJD.

EXAM:
MRI LUMBAR SPINE WITHOUT CONTRAST
TECHNIQUE: Multiplanar, multisequence MR imaging of the lumbar spine was
performed. No intravenous contrast was administered.

[Series 5: T2 · sagittal · 4.0mm · 0.81mm/px · 6 of 17 slices shown (1 of 2)]
[im 1/17]
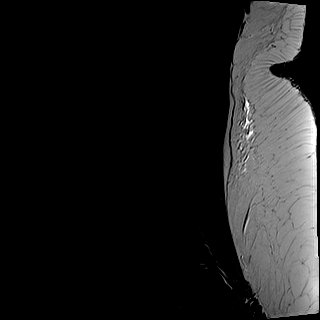
[im 4/17]
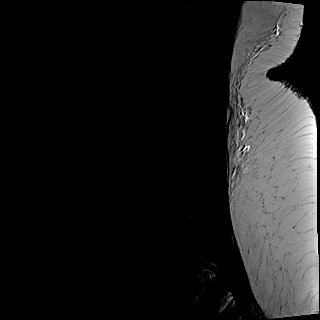
[im 7/17]
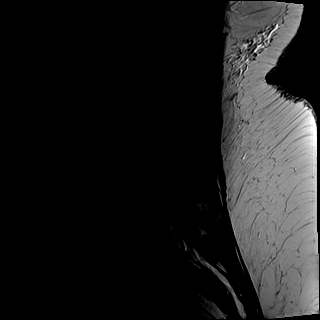
[im 10/17]
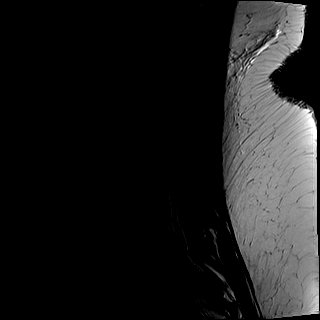
[im 13/17]
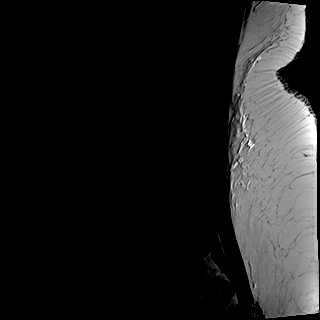
[im 17/17]
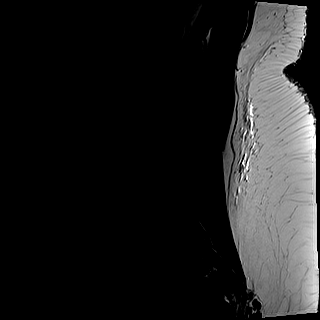

[Series 6: T1 · sagittal · 4.0mm · 0.81mm/px · 6 of 17 slices shown (1 of 2)]
[im 1/17]
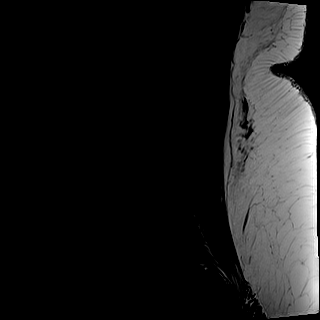
[im 4/17]
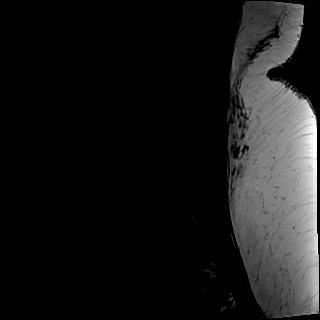
[im 7/17]
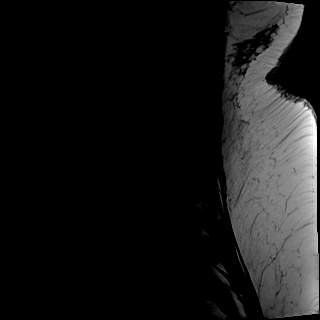
[im 10/17]
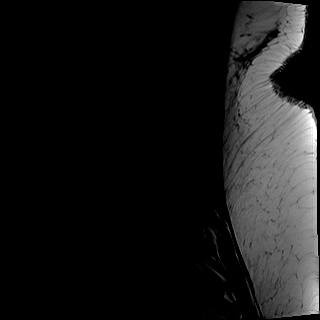
[im 13/17]
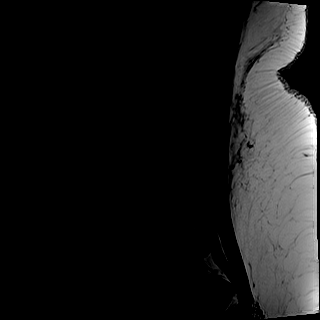
[im 17/17]
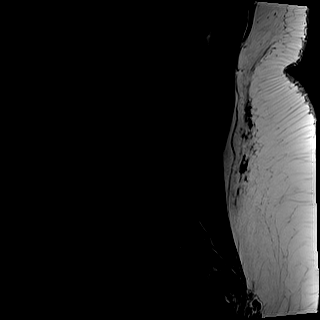

[Series 7: STIR · sagittal · 4.0mm · 0.41mm/px · 2 of 17 slices shown]
[im 1/17]
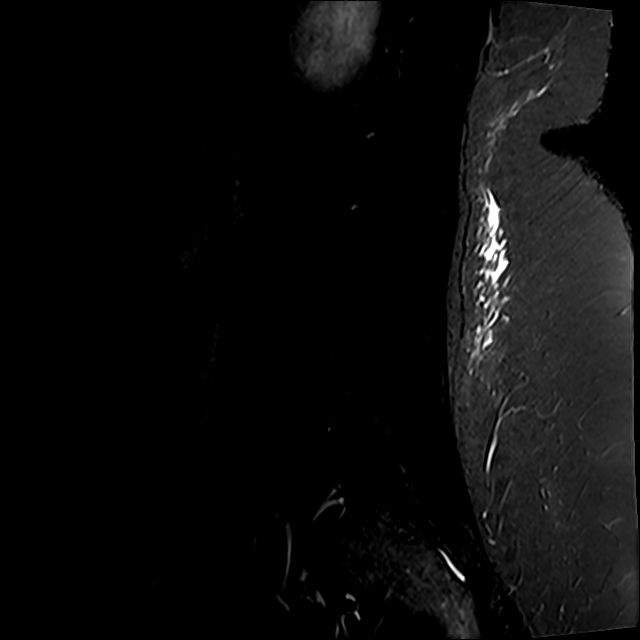
[im 4/17]
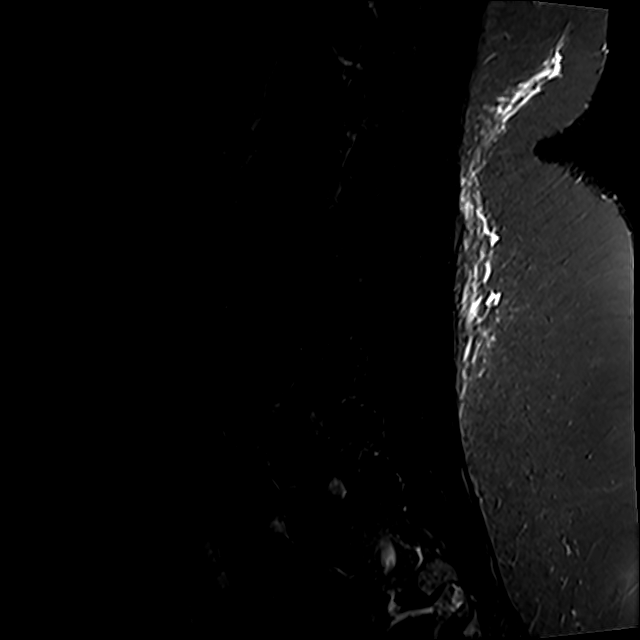

[Series 8: T2 · axial · 4.0mm · 0.78mm/px · z∈[-97,+134]mm · 9 of 39 slices shown (2 of 2)]
[im 1/39]
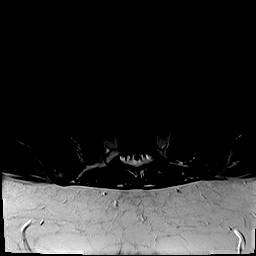
[im 6/39]
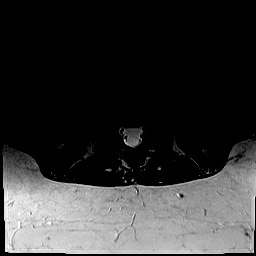
[im 11/39]
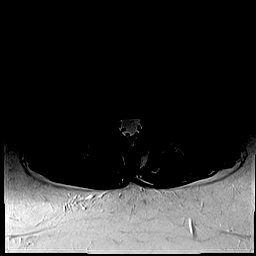
[im 17/39]
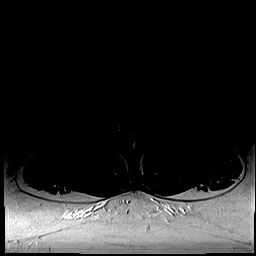
[im 20/39]
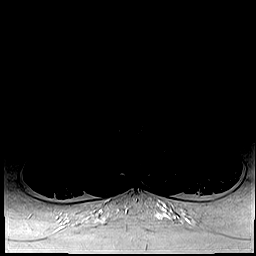
[im 22/39]
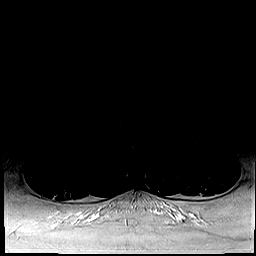
[im 28/39]
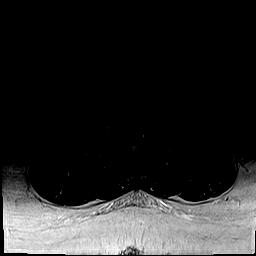
[im 33/39]
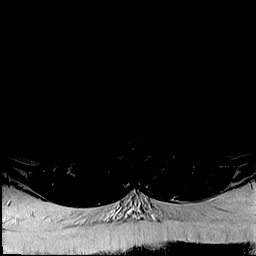
[im 39/39]
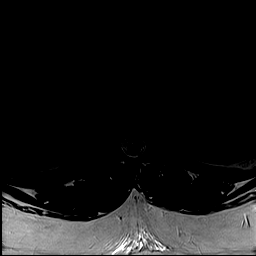

[Series 9: T1 · axial · 4.0mm · 0.39mm/px · z∈[-97,+134]mm · 9 of 39 slices shown (2 of 2)]
[im 1/39]
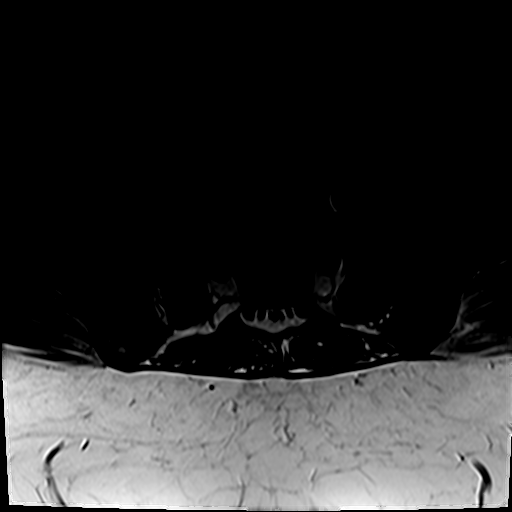
[im 6/39]
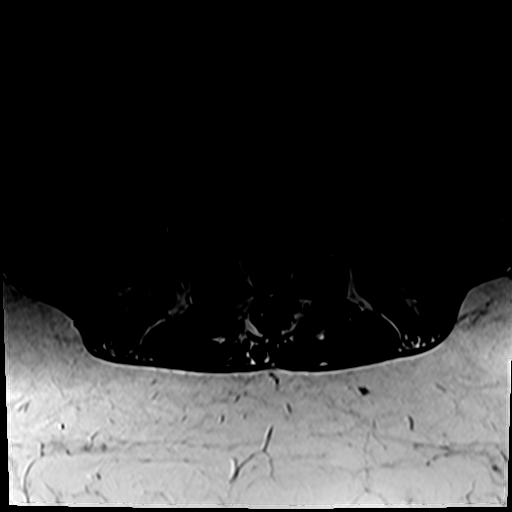
[im 11/39]
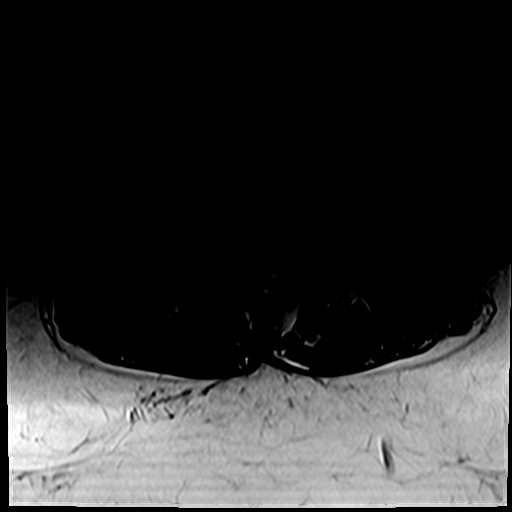
[im 17/39]
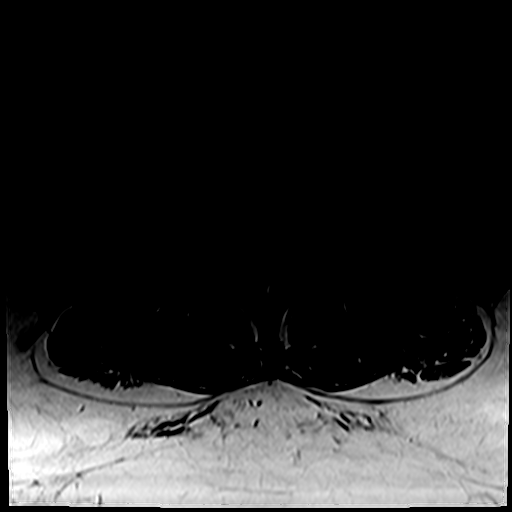
[im 20/39]
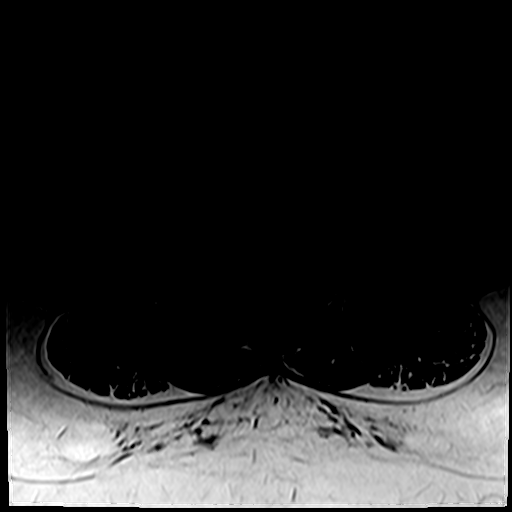
[im 22/39]
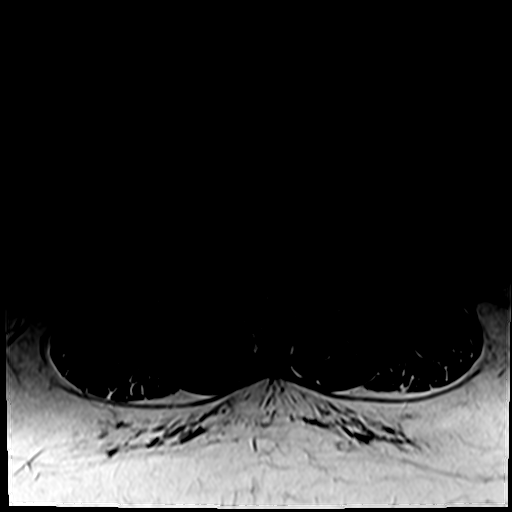
[im 28/39]
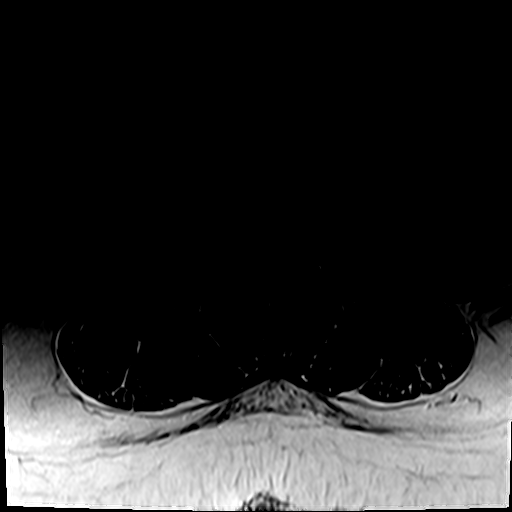
[im 33/39]
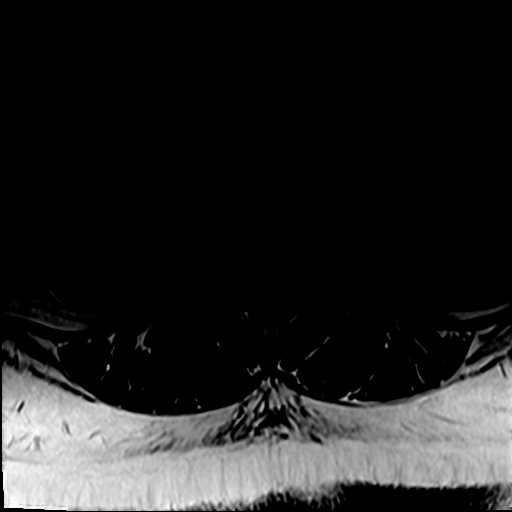
[im 39/39]
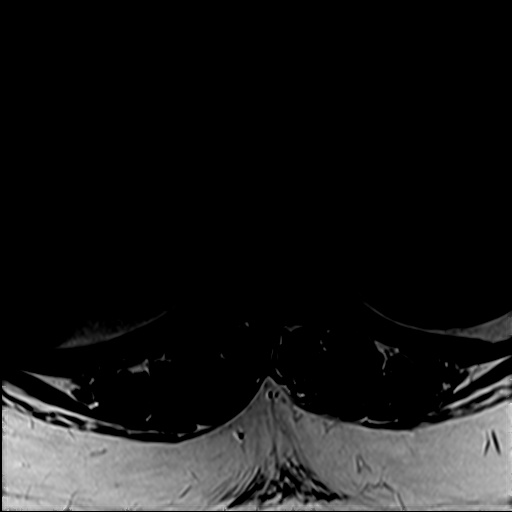

[32 of 48 positions shown; findings below may reference images not displayed]

FINDINGS: Segmentation:  Standard.

Alignment:  Grade 1 retrolisthesis at L4-5

Vertebrae:  No fracture, evidence of discitis, or bone lesion.

Conus medullaris and cauda equina: Conus extends to the L1 level.
Conus and cauda equina appear normal.

Paraspinal and other soft tissues: Negative

Disc levels:

The T12-L4 disc levels are normal.

L4-5: There is a small left foraminal disc protrusion without spinal
canal or neural foraminal stenosis.

L5-S1: Mild disc height loss without focal herniation. No spinal
canal or neural foraminal stenosis.
IMPRESSION: Mild lower lumbar degenerative disc disease without spinal canal or
neural foraminal stenosis.

## 2021-07-05 DIAGNOSIS — R109 Unspecified abdominal pain: Secondary | ICD-10-CM | POA: Diagnosis not present

## 2021-07-05 DIAGNOSIS — N803 Endometriosis of pelvic peritoneum, unspecified: Secondary | ICD-10-CM | POA: Diagnosis not present

## 2021-07-20 NOTE — Telephone Encounter (Signed)
Leave for Dr. Wynetta Emery review upon return.

## 2021-08-01 NOTE — Telephone Encounter (Signed)
Please schedule virtual appt ASAP, OK to double book

## 2021-08-02 ENCOUNTER — Telehealth (INDEPENDENT_AMBULATORY_CARE_PROVIDER_SITE_OTHER): Payer: BC Managed Care – PPO | Admitting: Family Medicine

## 2021-08-02 ENCOUNTER — Encounter: Payer: Self-pay | Admitting: Family Medicine

## 2021-08-02 DIAGNOSIS — M79605 Pain in left leg: Secondary | ICD-10-CM | POA: Diagnosis not present

## 2021-08-02 MED ORDER — GABAPENTIN 100 MG PO CAPS: 100.0000 mg | ORAL_CAPSULE | Freq: Every day | ORAL | 3 refills | Status: DC

## 2021-08-02 MED ORDER — TRAMADOL HCL 50 MG PO TABS: 50.0000 mg | ORAL_TABLET | Freq: Four times a day (QID) | ORAL | 0 refills | Status: DC | PRN

## 2021-08-02 MED ORDER — DULOXETINE HCL 20 MG PO CPEP: 40.0000 mg | ORAL_CAPSULE | Freq: Every day | ORAL | 3 refills | Status: DC

## 2021-08-02 NOTE — Assessment & Plan Note (Signed)
Significantly worse over the past couple of months with limping, swelling of the leg and point tenderness. X-ray of femur was normal. Will check MRI to R/O mass. If normal- concern for piriformis syndrome with some lumbar radiculopathy. Cannot tolerate gabapentin or flexeril due to fatigue and young kids. Will change her celexa to cymbalta and have her take gabapentin 100-300mg  qHS only. Short course of tramadol for severe pain given today. If not improving, and imaging normal- will get her into sports med vs PT.

## 2021-08-02 NOTE — Progress Notes (Signed)
There were no vitals taken for this visit.   Subjective:    Patient ID: Kimberly Santiago, female    DOB: 1976-01-28, 46 y.o.   MRN: [id number]  HPI: Kimberly Santiago is a 46 y.o. female  Chief Complaint  Patient presents with   Leg Pain    Patient states her left leg pain has been getting worse over the past few months. Patient has not heard from scheduling team yet for imaging.    LEG PAIN- hurts to get up to walk, hurts to lay down, has noticed swelling in the leg and point tenderness as well as limping Duration: 2+ years Pain: yes Severity: severe  Quality:  sharp, cramping severe pain Location:  outside of her leg on the L side between her knee and her hip that radiates into her buttock Bilateral:  no Onset: sudden Frequency: constant Time of  day:    constant Sudden unintentional leg jerking:   no Paresthesias:   no Decreased sensation:  no Weakness:   yes Insomnia:   yes Fatigue:   yes Status: worse Treatments attempted: gabapentin made her very sleepy, so did the flexeril   Relevant past medical, surgical, family and social history reviewed and updated as indicated. Interim medical history since our last visit reviewed. Allergies and medications reviewed and updated.  Review of Systems  Constitutional: Negative.   Respiratory: Negative.    Cardiovascular: Negative.   Gastrointestinal: Negative.   Musculoskeletal:  Positive for arthralgias, gait problem and myalgias. Negative for back pain, joint swelling, neck pain and neck stiffness.  Skin: Negative.   Psychiatric/Behavioral: Negative.     Per HPI unless specifically indicated above     Objective:    There were no vitals taken for this visit.  Wt Readings from Last 3 Encounters:  06/09/21 (!) 305 lb (138.3 kg)  06/06/21 (!) 305 lb (138.3 kg)  05/29/21 (!) 308 lb (139.7 kg)    Physical Exam Vitals and nursing note reviewed.  Constitutional:      General: She is not in acute distress.    Appearance:  Normal appearance. She is not ill-appearing, toxic-appearing or diaphoretic.  HENT:     Head: Normocephalic and atraumatic.     Right Ear: External ear normal.     Left Ear: External ear normal.     Nose: Nose normal.     Mouth/Throat:     Mouth: Mucous membranes are moist.     Pharynx: Oropharynx is clear.  Eyes:     General: No scleral icterus.       Right eye: No discharge.        Left eye: No discharge.     Conjunctiva/sclera: Conjunctivae normal.     Pupils: Pupils are equal, round, and reactive to light.  Pulmonary:     Effort: Pulmonary effort is normal. No respiratory distress.     Comments: Speaking in full sentences Musculoskeletal:        General: Normal range of motion.     Cervical back: Normal range of motion.  Skin:    Coloration: Skin is not jaundiced or pale.     Findings: No bruising, erythema, lesion or rash.  Neurological:     Mental Status: She is alert and oriented to person, place, and time. Mental status is at baseline.  Psychiatric:        Mood and Affect: Mood normal.        Behavior: Behavior normal.  Thought Content: Thought content normal.        Judgment: Judgment normal.    Results for orders placed or performed in visit on 06/18/21  CULTURE, URINE COMPREHENSIVE   Specimen: Urine   UR  Result Value Ref Range   Urine Culture, Comprehensive Final report (A)    Organism ID, Bacteria Comment (A)   Microscopic Examination   Urine  Result Value Ref Range   WBC, UA None seen 0 - 5 /hpf   RBC None seen 0 - 2 /hpf   Epithelial Cells (non renal) >10 (H) 0 - 10 /hpf   Bacteria, UA None seen None seen/Few  Urinalysis, Complete  Result Value Ref Range   Specific Gravity, UA 1.025 1.005 - 1.030   pH, UA 5.5 5.0 - 7.5   Color, UA Yellow Yellow   Appearance Ur Cloudy (A) Clear   Leukocytes,UA Negative Negative   Protein,UA Negative Negative/Trace   Glucose, UA Negative Negative   Ketones, UA Trace (A) Negative   RBC, UA Negative Negative    Bilirubin, UA Negative Negative   Urobilinogen, Ur 0.2 0.2 - 1.0 mg/dL   Nitrite, UA Negative Negative   Microscopic Examination See below:       Assessment & Plan:   Problem List Items Addressed This Visit       Other   Left leg pain - Primary    Significantly worse over the past couple of months with limping, swelling of the leg and point tenderness. X-ray of femur was normal. Will check MRI to R/O mass. If normal- concern for piriformis syndrome with some lumbar radiculopathy. Cannot tolerate gabapentin or flexeril due to fatigue and young kids. Will change her celexa to cymbalta and have her take gabapentin 100-300mg  qHS only. Short course of tramadol for severe pain given today. If not improving, and imaging normal- will get her into sports med vs PT.         Follow up plan: Return for Pending results. .   This visit was completed via video visit through MyChart due to the restrictions of the COVID-19 pandemic. All issues as above were discussed and addressed. Physical exam was done as above through visual confirmation on video through MyChart. If it was felt that the patient should be evaluated in the office, they were directed there. The patient verbally consented to this visit. Location of the patient: work Location of the provider: work Those involved with this call:  Provider: Olevia Perches, DO CMA: Rolley Sims, CMA Front Desk/Registration: [employer]  Time spent on call:  15 minutes with patient face to face via video conference. More than 50% of this time was spent in counseling and coordination of care. 23 minutes total spent in review of patient's record and preparation of their chart.

## 2021-08-02 NOTE — Telephone Encounter (Signed)
Pt scheduled at 10:40 am

## 2021-08-03 ENCOUNTER — Ambulatory Visit
Admission: RE | Admit: 2021-08-03 | Discharge: 2021-08-03 | Disposition: A | Discharge status: Home / Self Care | Payer: BC Managed Care – PPO | Source: Ambulatory Visit | Attending: Family Medicine | Admitting: Family Medicine

## 2021-08-03 ENCOUNTER — Other Ambulatory Visit: Payer: Self-pay

## 2021-08-03 DIAGNOSIS — M79605 Pain in left leg: Secondary | ICD-10-CM | POA: Insufficient documentation

## 2021-08-03 DIAGNOSIS — M25462 Effusion, left knee: Secondary | ICD-10-CM | POA: Diagnosis not present

## 2021-08-03 DIAGNOSIS — M79652 Pain in left thigh: Secondary | ICD-10-CM | POA: Diagnosis not present

## 2021-08-03 DIAGNOSIS — M7062 Trochanteric bursitis, left hip: Secondary | ICD-10-CM | POA: Diagnosis not present

## 2021-08-03 DIAGNOSIS — M7602 Gluteal tendinitis, left hip: Secondary | ICD-10-CM | POA: Diagnosis not present

## 2021-08-03 IMAGING — MR MR FEMUR*L* WO/W CM
9 series · 40 of 40 positions shown · IV contrast (gadavist)
Comparison: Left femur radiograph [DATE], CT abdomen pelvis
[DATE].

CLINICAL DATA: Proximal left lateral thigh pain, radiates into the
buttocks

EXAM:
MR OF THE LEFT LOWER EXTREMITY WITHOUT AND WITH CONTRAST
TECHNIQUE: Multiplanar, multisequence MR imaging of the left was performed both
before and after administration of intravenous contrast.
CONTRAST:  10mL GADAVIST GADOBUTROL 1 MMOL/ML IV SOLN

[Series 7: composed cor t1_comp_filt · coronal · left · 5.6mm · 1.08mm/px · 2 of 34 slices shown (1 of 2)]
[im 1/34]
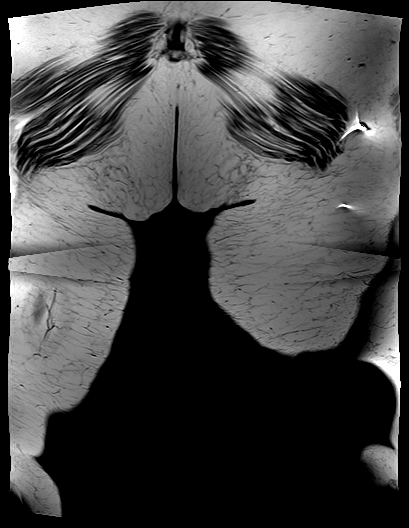
[im 34/34]
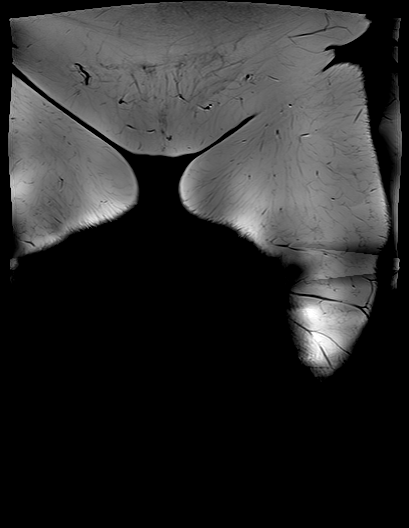

[Series 11: composed cor stir_comp_filt · coronal · left · 5.6mm · 1.22mm/px · 2 of 34 slices shown]
[im 1/34]
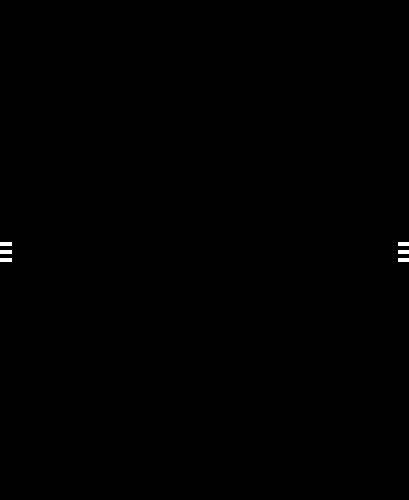
[im 34/34]
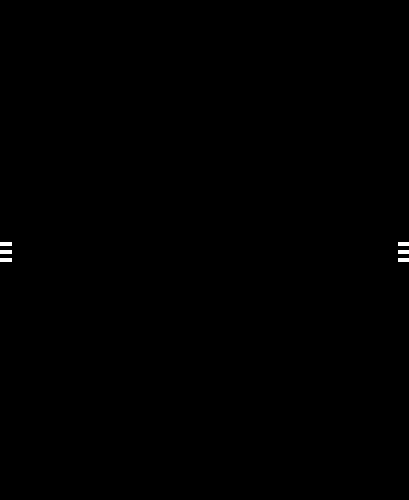

[Series 14: ax t1_comp · axial · left · 5.0mm · 0.98mm/px · z∈[-366,+203]mm · 7 of 96 slices shown]
[im 1/96]
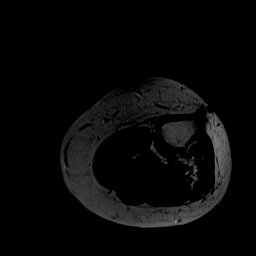
[im 16/96]
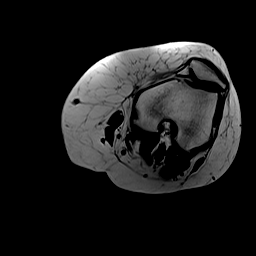
[im 32/96]
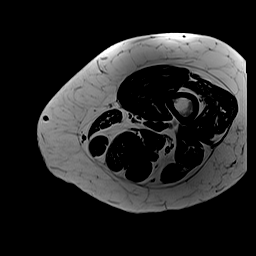
[im 48/96]
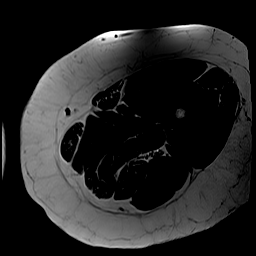
[im 64/96]
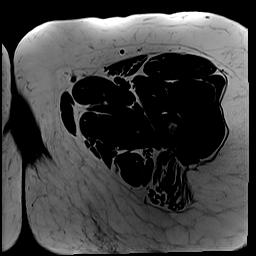
[im 80/96]
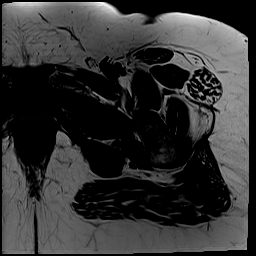
[im 96/96]
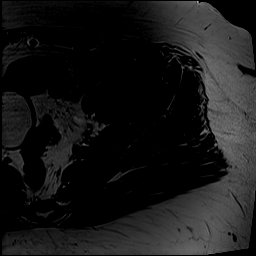

[Series 17: T2 · axial · left · 5.0mm · 0.86mm/px · z∈[-365,+204]mm · 7 of 96 slices shown]
[im 1/96]
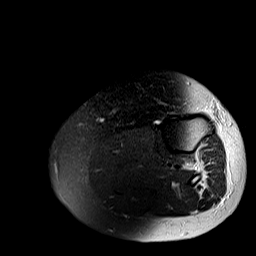
[im 16/96]
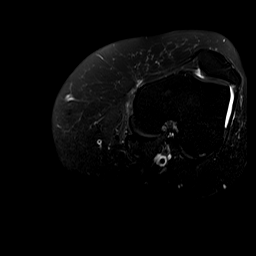
[im 32/96]
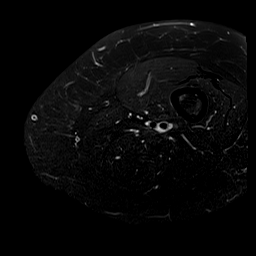
[im 48/96]
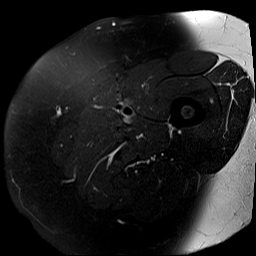
[im 64/96]
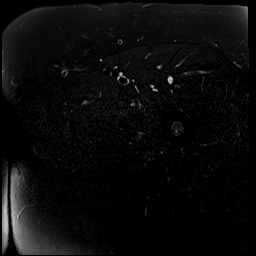
[im 80/96]
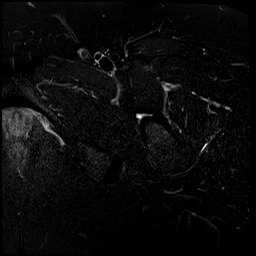
[im 96/96]
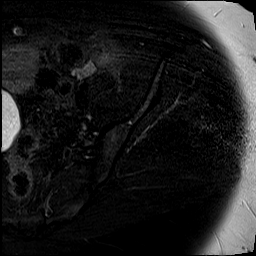

[Series 20: t2_tse_sag fs_comp · sagittal · left · 5.6mm · 0.94mm/px · 3 of 36 slices shown]
[im 1/36]
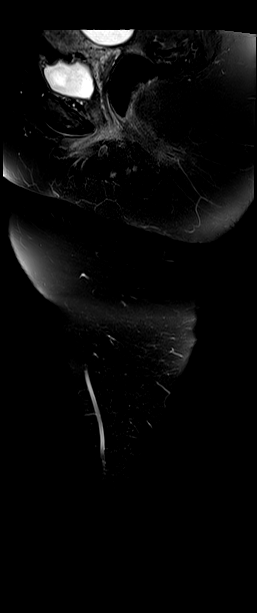
[im 18/36]
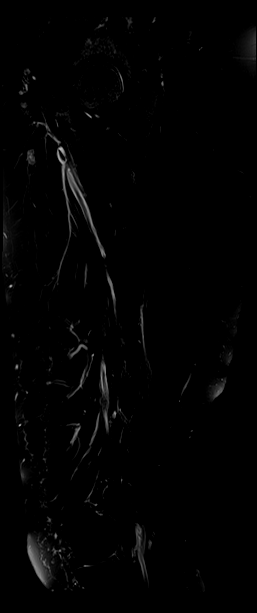
[im 36/36]
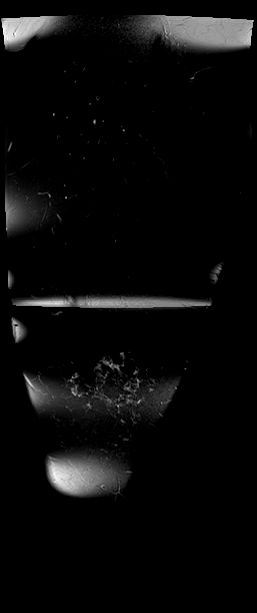

[Series 24: composed cor t1_comp_filt · coronal · left · 5.6mm · 1.22mm/px · 2 of 34 slices shown (2 of 2)]
[im 1/34]
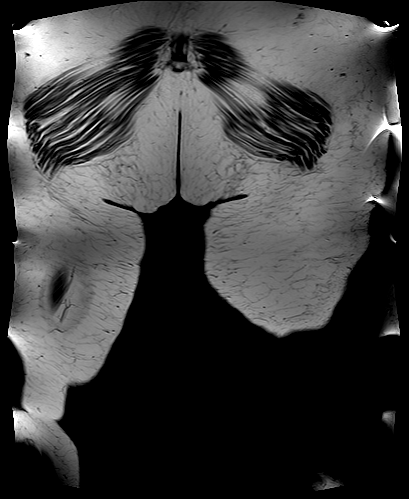
[im 34/34]
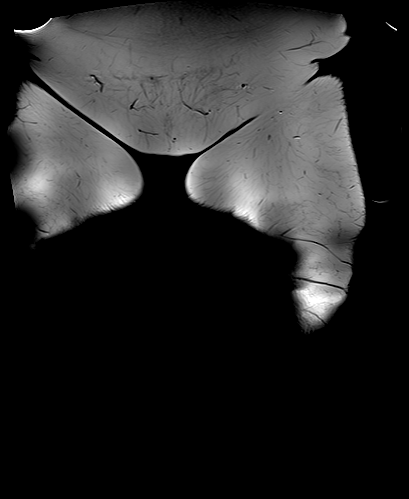

[Series 27: T1 fat-sat · axial · left · 5.0mm · 0.98mm/px · z∈[-366,+203]mm · 7 of 96 slices shown (1 of 3)]
[im 1/96]
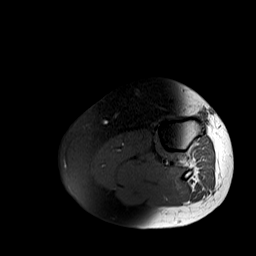
[im 16/96]
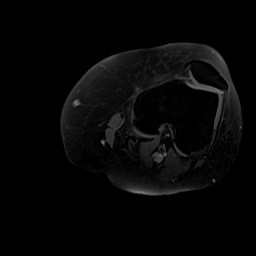
[im 32/96]
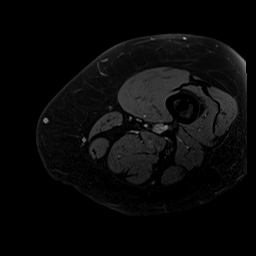
[im 48/96]
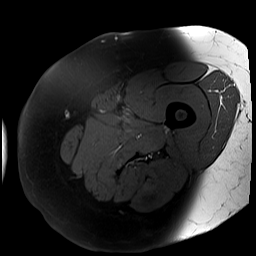
[im 64/96]
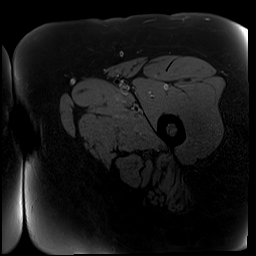
[im 80/96]
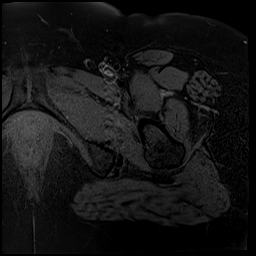
[im 96/96]
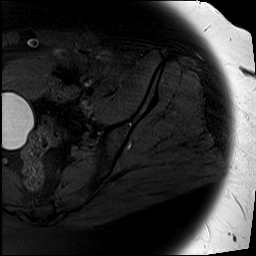

[Series 30: T1 fat-sat · axial · left · 5.0mm · 0.98mm/px · z∈[-366,+203]mm · 7 of 96 slices shown (2 of 3)]
[im 1/96]
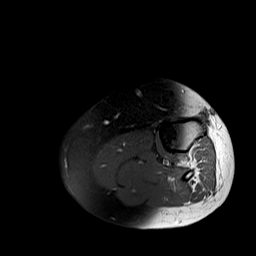
[im 16/96]
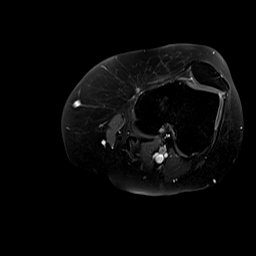
[im 32/96]
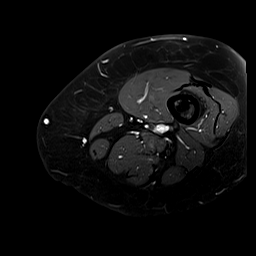
[im 48/96]
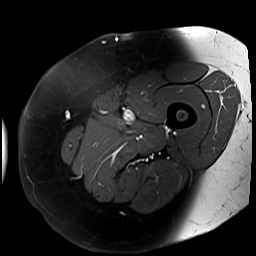
[im 64/96]
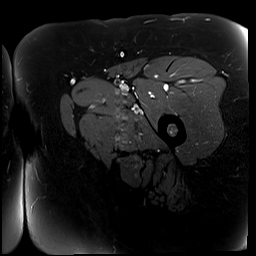
[im 80/96]
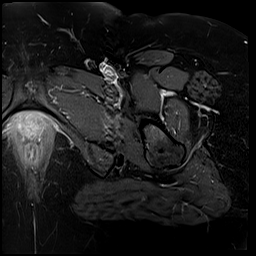
[im 96/96]
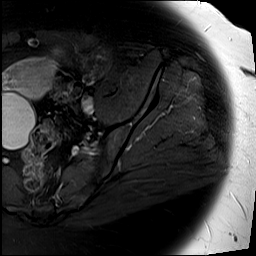

[Series 33: T1 fat-sat · sagittal · left · 5.6mm · 0.94mm/px · 3 of 36 slices shown (3 of 3)]
[im 1/36]
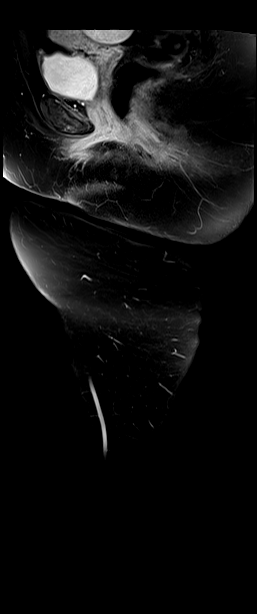
[im 18/36]
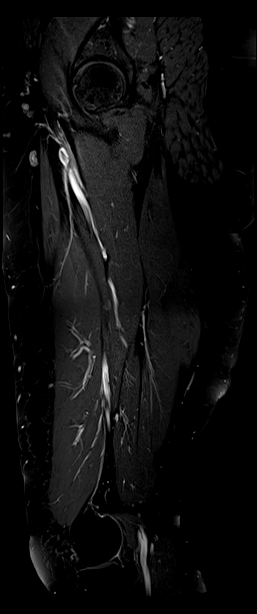
[im 36/36]
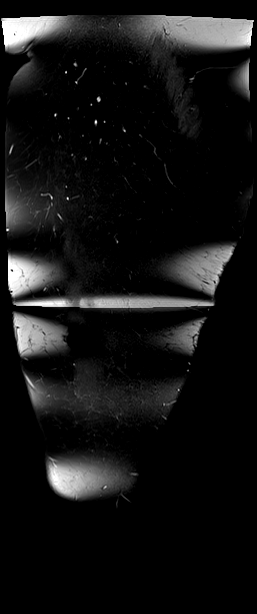

[40 of 40 positions shown; findings below may reference images not displayed]

FINDINGS: Bones/Joint/Cartilage

The cortex is intact. There is no significant marrow signal
alteration. There is no focal bone lesion. No significant left hip
joint effusion. Small left knee effusion. No evidence of left hip
AVN. No significant left hip arthritis.

Ligaments

Grossly intact.

Muscles and Tendons

There is mild insertional tendinosis of the left gluteus minimus and
medius tendons at the greater trochanter, with mild subgluteus
minimus and medius bursitis. Probable low-grade partial width
tearing of the gluteus medius tendon laterally at the insertion.
Minimal trochanteric bursal fluid. There is mild perifascial fluid,
edema, and enhancement at the interface of the distal gluteus medius
and maximus muscles and the gluteal aponeurotic fascia (axial T1
post-contrast image 15). No significant muscle atrophy.

Proximal hamstrings retract.  The hip adductors are intact.

Soft tissues

There is no focal fluid collection. There is no cystic or solid mass
in the left thigh.

In the pelvis, there is a partially visualized right adnexal cystic
lesion which measures up to 6.0 x 4.6 cm (coronal stir image 18),
new since prior CT in [94]. There is an internal septation and
varying signal intensity.
IMPRESSION: Mild insertional tendinosis of the left gluteus minimus and medius
tendons at the greater trochanter, with mild sub gluteus bursitis.
Probable low-grade partial width tearing of the gluteus minimus
tendon laterally at the insertion. Minimal associated trochanteric
bursal fluid and mild inflammatory change at the interface of the
distal gluteus medius and maximus muscles and the gluteal
aponeurotic fascia.

Partially visualized right adnexal cystic lesion with internal
septation measuring up to 6.0 x 4.6 cm. This is new from prior CT in
[DATE]. Recommend pelvic ultrasound.

## 2021-08-03 MED ORDER — GADOBUTROL 1 MMOL/ML IV SOLN
10.0000 mL | Freq: Once | INTRAVENOUS | Status: AC | PRN
  Administered 2021-08-03: 10 mL via INTRAVENOUS

## 2021-08-06 ENCOUNTER — Telehealth: Payer: Self-pay | Admitting: Family Medicine

## 2021-08-06 DIAGNOSIS — M7072 Other bursitis of hip, left hip: Secondary | ICD-10-CM

## 2021-08-06 DIAGNOSIS — S76012A Strain of muscle, fascia and tendon of left hip, initial encounter: Secondary | ICD-10-CM

## 2021-08-06 NOTE — Telephone Encounter (Signed)
Called to go over results of MRI with patient. She will go to sports med for evaluation. Referral generated today. Appointment scheduled.   She is aware of cyst on her ovary and has been working with GYN regarding that.

## 2021-08-23 ENCOUNTER — Other Ambulatory Visit: Payer: Self-pay

## 2021-08-23 ENCOUNTER — Ambulatory Visit (INDEPENDENT_AMBULATORY_CARE_PROVIDER_SITE_OTHER): Payer: BC Managed Care – PPO | Admitting: Family Medicine

## 2021-08-23 ENCOUNTER — Encounter: Payer: Self-pay | Admitting: Family Medicine

## 2021-08-23 VITALS — BP 102/72 | HR 77 | Ht 67.0 in | Wt 307.0 lb

## 2021-08-23 DIAGNOSIS — M47816 Spondylosis without myelopathy or radiculopathy, lumbar region: Secondary | ICD-10-CM

## 2021-08-23 DIAGNOSIS — M25552 Pain in left hip: Secondary | ICD-10-CM | POA: Diagnosis not present

## 2021-08-23 MED ORDER — DICLOFENAC SODIUM 50 MG PO TBEC: 50.0000 mg | DELAYED_RELEASE_TABLET | Freq: Two times a day (BID) | ORAL | 0 refills | Status: DC

## 2021-08-23 NOTE — Assessment & Plan Note (Signed)
2-year atraumatic posterolateral left hip pain in the setting of recent MRI noted spondylosis. See additional assessment(s) for plan details.

## 2021-08-23 NOTE — Assessment & Plan Note (Signed)
Patient with 2-year atraumatic posterolateral left hip pain with radiation along the anterolateral left thigh, question paresthesias, weakness noted secondary to pain/discomfort, aggravated by prolonged weightbearing, sitting, laying on her left side.  Onset noted during pregnancy, treatments to date include gabapentin, cyclobenzaprine, tramadol, Tylenol, Voltaren gel, heat, ice, stretching, all with limited response.  She has had MRI studies of the left femur and lumbar spine ordered by her PCP Dr. Olevia Perches and presents for further evaluation.  Examination reveals limited range of motion due to hamstring tightness in left leg, negative straight leg raise, positive Pearlean Brownie, equivocal FADIR localizing to the posterior lateral hip, equivocal psoas testing, maximal and focal tenderness at the left greater trochanter which recreates her stated pain, secondarily to the left SI joint, right benign, and at the region of the gluteus medius/minimus insertion about the left greater trochanter.  Given her MRI findings, clinical history, and findings today, these are most consistent with greater trochanteric pain syndrome in the setting of lumbosacral spondylosis and associated relative compensatory changes/deconditioning.  I reviewed the images with the patient, the nature of this condition, short and long-term treatment strategies.  We will have the patient contact her [facility] to determine if we can proceed with a 1-2-week course of scheduled diclofenac, coordinate close follow-up for reevaluation and, if symptomatic, strongly consider ultrasound-guided injections.  Once symptoms adequately controlled, formal versus home-based PT to commence.  I also advised scheduled acetaminophen dosing over the interim.  Chronic condition, symptomatic, Rx management, independent interpretation imaging

## 2021-08-23 NOTE — Patient Instructions (Signed)
-   Reach out to [facility] gastroenterology regarding possible short-term (1-2-week) usage of diclofenac 50 mg twice daily - If approved, start medication until follow-up - Additionally, can alternate between ice and heat at 20-minute intervals - Lastly, can use acetaminophen (Tylenol) 1000 mg 3 times daily scheduled - Return once scheduled for follow-up, contact for questions between now and then

## 2021-08-23 NOTE — Progress Notes (Signed)
Primary Care / Sports Medicine Office Visit  Patient Information:  Patient ID: Kimberly Santiago, female DOB: 03-28-1976 Age: 46 y.o. MRN: [id number]   Kimberly Santiago is a pleasant 46 y.o. female presenting with the following:  Chief Complaint  Patient presents with   New Patient (Initial Visit)   Leg Pain    Left; x2 years, MRIs 07/03/21 and 08/03/21; started during pregnancy; no known injury or trauma; treatments include: gabapentin, cyclobenzaprine, tramadol, Tylenol, Voltaren gel, heat, ice, stretching, repositioning with no relief; describes pain as constant, throbbing, radiating, pulsating, sharp depending on movement; worse when getting up from seated or lying position; 5/10 pain in office    Vitals:   08/23/21 0957  BP: 102/72  Pulse: 77  SpO2: 97%   Vitals:   08/23/21 0957  Weight: (!) 307 lb (139.3 kg)  Height: 5\' 7"  (1.702 m)   Body mass index is 48.08 kg/m.  MR FEMUR LEFT W WO CONTRAST  Result Date: 08/05/2021 CLINICAL DATA:  Proximal left lateral thigh pain, radiates into the buttocks EXAM: MR OF THE LEFT LOWER EXTREMITY WITHOUT AND WITH CONTRAST TECHNIQUE: Multiplanar, multisequence MR imaging of the left was performed both before and after administration of intravenous contrast. CONTRAST:  3mL GADAVIST GADOBUTROL 1 MMOL/ML IV SOLN COMPARISON:  Left femur radiograph 05/31/2019, CT abdomen pelvis 01/03/2014. FINDINGS: Bones/Joint/Cartilage The cortex is intact. There is no significant marrow signal alteration. There is no focal bone lesion. No significant left hip joint effusion. Small left knee effusion. No evidence of left hip AVN. No significant left hip arthritis. Ligaments Grossly intact. Muscles and Tendons There is mild insertional tendinosis of the left gluteus minimus and medius tendons at the greater trochanter, with mild subgluteus minimus and medius bursitis. Probable low-grade partial width tearing of the gluteus medius tendon laterally at the insertion.  Minimal trochanteric bursal fluid. There is mild perifascial fluid, edema, and enhancement at the interface of the distal gluteus medius and maximus muscles and the gluteal aponeurotic fascia (axial T1 post-contrast image 15). No significant muscle atrophy. Proximal hamstrings retract.  The hip adductors are intact. Soft tissues There is no focal fluid collection. There is no cystic or solid mass in the left thigh. In the pelvis, there is a partially visualized right adnexal cystic lesion which measures up to 6.0 x 4.6 cm (coronal stir image 18), new since prior CT in 2015. There is an internal septation and varying signal intensity. IMPRESSION: Mild insertional tendinosis of the left gluteus minimus and medius tendons at the greater trochanter, with mild sub gluteus bursitis. Probable low-grade partial width tearing of the gluteus minimus tendon laterally at the insertion. Minimal associated trochanteric bursal fluid and mild inflammatory change at the interface of the distal gluteus medius and maximus muscles and the gluteal aponeurotic fascia. Partially visualized right adnexal cystic lesion with internal septation measuring up to 6.0 x 4.6 cm. This is new from prior CT in July 2015. Recommend pelvic ultrasound. Electronically Signed   By: Maurine Simmering M.D.   On: 08/05/2021 10:18     Independent interpretation of notes and tests performed by another provider:   Independent interpretation of recent lumbar spine and left femur MRI studies reveals focal T2 signal about the left greater trochanter and insertional tendons of the gluteus medius/minimus most consistent with bursitis and tendinopathy, lumbar spine with multilevel intervertebral generative changes with focality about the L4-5 and L5-S1  Procedures performed:   None  Pertinent History, Exam, Impression, and Recommendations:  Greater trochanteric pain syndrome of left lower extremity Patient with 2-year atraumatic posterolateral left hip pain  with radiation along the anterolateral left thigh, question paresthesias, weakness noted secondary to pain/discomfort, aggravated by prolonged weightbearing, sitting, laying on her left side.  Onset noted during pregnancy, treatments to date include gabapentin, cyclobenzaprine, tramadol, Tylenol, Voltaren gel, heat, ice, stretching, all with limited response.  She has had MRI studies of the left femur and lumbar spine ordered by her PCP Dr. Park Liter and presents for further evaluation.  Examination reveals limited range of motion due to hamstring tightness in left leg, negative straight leg raise, positive Corky Sox, equivocal FADIR localizing to the posterior lateral hip, equivocal psoas testing, maximal and focal tenderness at the left greater trochanter which recreates her stated pain, secondarily to the left SI joint, right benign, and at the region of the gluteus medius/minimus insertion about the left greater trochanter.  Given her MRI findings, clinical history, and findings today, these are most consistent with greater trochanteric pain syndrome in the setting of lumbosacral spondylosis and associated relative compensatory changes/deconditioning.  I reviewed the images with the patient, the nature of this condition, short and long-term treatment strategies.  We will have the patient contact her [facility] to determine if we can proceed with a 1-2-week course of scheduled diclofenac, coordinate close follow-up for reevaluation and, if symptomatic, strongly consider ultrasound-guided injections.  Once symptoms adequately controlled, formal versus home-based PT to commence.  I also advised scheduled acetaminophen dosing over the interim.  Chronic condition, symptomatic, Rx management, independent interpretation imaging  Lumbar spondylosis 2-year atraumatic posterolateral left hip pain in the setting of recent MRI noted spondylosis. See additional assessment(s) for plan details.   Orders &  Medications Meds ordered this encounter  Medications   diclofenac (VOLTAREN) 50 MG EC tablet    Sig: Take 1 tablet (50 mg total) by mouth 2 (two) times daily.    Dispense:  28 tablet    Refill:  0   No orders of the defined types were placed in this encounter.    Return in about 1 week (around 08/30/2021) for follow-up.     Montel Culver, MD   Primary Care Sports Medicine [place]

## 2021-08-24 ENCOUNTER — Encounter: Payer: Self-pay | Admitting: Family Medicine

## 2021-08-24 NOTE — Telephone Encounter (Signed)
For your information  

## 2021-08-27 ENCOUNTER — Encounter: Payer: Self-pay | Admitting: Family Medicine

## 2021-08-31 ENCOUNTER — Inpatient Hospital Stay: Payer: Self-pay | Admitting: Radiology

## 2021-08-31 ENCOUNTER — Other Ambulatory Visit: Payer: Self-pay

## 2021-08-31 ENCOUNTER — Encounter: Payer: Self-pay | Admitting: Family Medicine

## 2021-08-31 ENCOUNTER — Ambulatory Visit (INDEPENDENT_AMBULATORY_CARE_PROVIDER_SITE_OTHER): Payer: BC Managed Care – PPO | Admitting: Family Medicine

## 2021-08-31 VITALS — BP 108/68 | HR 71 | Ht 67.0 in | Wt 311.0 lb

## 2021-08-31 DIAGNOSIS — M533 Sacrococcygeal disorders, not elsewhere classified: Secondary | ICD-10-CM

## 2021-08-31 DIAGNOSIS — M25552 Pain in left hip: Secondary | ICD-10-CM | POA: Diagnosis not present

## 2021-08-31 DIAGNOSIS — G5702 Lesion of sciatic nerve, left lower limb: Secondary | ICD-10-CM

## 2021-08-31 MED ORDER — TRIAMCINOLONE ACETONIDE 40 MG/ML IJ SUSP: 40.0000 mg | Freq: Once | INTRAMUSCULAR | Status: DC

## 2021-08-31 MED ORDER — TRIAMCINOLONE ACETONIDE 40 MG/ML IJ SUSP
120.0000 mg | Freq: Once | INTRAMUSCULAR | Status: AC
  Administered 2021-08-31: 120 mg

## 2021-08-31 NOTE — Patient Instructions (Signed)
You have just been given a cortisone injection to reduce pain and inflammation. After the injection you may notice immediate relief of pain as a result of the Lidocaine. It is important to rest the area of the injection for 24 to 48 hours after the injection. There is a possibility of some temporary increased discomfort and swelling for up to 72 hours until the cortisone begins to work. If you do have pain, simply rest the joint and use ice. If you can tolerate over the counter medications, you can try Tylenol for added relief per package instructions. ?- As above, relative rest x2 days then gradual return to normal activity ?- Start physical therapy once scheduled ?- Contact for any questions and follow-up as needed particular for any symptoms that linger after physical therapy course ?

## 2021-09-03 DIAGNOSIS — F419 Anxiety disorder, unspecified: Secondary | ICD-10-CM | POA: Diagnosis not present

## 2021-09-04 NOTE — Assessment & Plan Note (Signed)
Patient presents for follow-up to left piriformis syndrome in the setting of lumbosacral spondylosis and associated deconditioning throughout the deep hip/core musculature.  While she has noted mild improvement from diclofenac that she was able to initiate, given her persistent symptomatology we did discuss additional treatment strategies and she did elect to proceed with ultrasound-guided piriformis/static nerve corticosteroid injection.  Post care reviewed and recommendation for PT advised.  She can dose additional diclofenac on as-needed basis until symptoms respond to cortisone. ? ?Chronic condition, symptomatic, Rx management ?

## 2021-09-04 NOTE — Assessment & Plan Note (Signed)
Patient with chronic left lateral hip pain in the setting of MRI evident gluteus medius tendinopathy felt cold about the greater trochanter on the left, additionally lumbosacral with multilevel spondylosis.  She was able to initiate diclofenac with moderate response.  Given her persistent symptomatology we discussed additional treatment strategies and she did elect to proceed with ultrasound-guided corticosteroid injection to the left greater trochanter.  She tolerated this well, post care reviewed, transition to as needed diclofenac advised until symptoms respond to cortisone, and recommendation for formal PT provided. ? ?Chronic condition, symptomatic, Rx management ?

## 2021-09-04 NOTE — Assessment & Plan Note (Signed)
Patient presents for follow-up to chronic left hip pain in the setting of lumbosacral spondylosis and left trochanteric pain syndrome.  While she has tolerated diclofenac well, she continues to be symptomatic with focal findings of the left SI joint on examination today.  We discussed additional treatment options and she did elect to proceed with ultrasound-guided left SI joint injection.  She tolerated this well, post care reviewed, as needed diclofenac advised from a medication management standpoint, and recommendation for formal PT. ? ?Chronic condition, symptomatic, Rx management ?

## 2021-09-04 NOTE — Progress Notes (Signed)
?  ? ?Primary Care / Sports Medicine Office Visit ? ?Patient Information:  ?Patient ID: Kimberly Santiago, female DOB: [birth year] Age: 46 y.o. MRN: [id number]  ? ?Kimberly Santiago is a pleasant 46 y.o. female presenting with the following: ? ?Chief Complaint  ?Patient presents with  ? Greater trochanteric pain syndrome of left lower extremity  ? ? ?Vitals:  ? 08/31/21 1045  ?BP: 108/68  ?Pulse: 71  ?SpO2: 97%  ? ?Vitals:  ? 08/31/21 1045  ?Weight: (!) 311 lb (141.1 kg)  ?Height: 5\' 7"  (1.702 m)  ? ?Body mass index is 48.71 kg/m?. ? ? LIMITED JOINT SPACE STRUCTURES LOW LEFT ? ?Result Date: 09/04/2021 ?Procedure:  Injection of left greater trochanteric bursal region under ultrasound guidance. Ultrasound guidance utilized for out of plane approach to left greater trochanteric bursal region, no sonographic findings of tendinopathy or effusion Samsung HS60 device utilized with permanent recording / reporting. Verbal informed consent obtained and verified. Skin prepped in a sterile fashion. Ethyl chloride for topical local analgesia. Completed without difficulty and tolerated well. Medication: triamcinolone acetonide 40 mg/mL suspension for injection 1 mL total and 2 mL lidocaine 1% without epinephrine utilized for needle placement anesthetic Advised to contact for fevers/chills, erythema, induration, drainage, or persistent bleeding. Procedure:  Injection of left sacroiliac joint under ultrasound guidance. Ultrasound guidance utilized for in-plane approach to the left SI joint medial-lateral, no effusion noted, hypoechoic response from injectate visualized Samsung HS60 device utilized with permanent recording / reporting. Verbal informed consent obtained and verified. Skin prepped in a sterile fashion. Ethyl chloride for topical local analgesia. Completed without difficulty and tolerated well. Medication: triamcinolone acetonide 40 mg/mL suspension for injection 1 mL total and 2 mL lidocaine 1% without epinephrine utilized  for needle placement anesthetic Advised to contact for fevers/chills, erythema, induration, drainage, or persistent bleeding. Procedure:  Injection of left piriformis episodic nerve junction under ultrasound guidance. Ultrasound guidance utilized for in-plane approach to inject the junction of the piriformis and sciatic nerve, no abnormalities to muscular morphology noted sonographically Samsung HS60 device utilized with permanent recording / reporting. Verbal informed consent obtained and verified. Skin prepped in a sterile fashion. Ethyl chloride for topical local analgesia. Completed without difficulty and tolerated well. Medication: triamcinolone acetonide 40 mg/mL suspension for injection 1 mL total and 2 mL lidocaine 1% without epinephrine utilized for needle placement anesthetic Advised to contact for fevers/chills, erythema, induration, drainage, or persistent bleeding.   ? ?Independent interpretation of notes and tests performed by another provider:  ? ?None ? ?Procedures performed:  ? ?Procedure:  Injection of left greater trochanteric bursal region under ultrasound guidance. ?Ultrasound guidance utilized for out of plane approach to left greater trochanteric bursal region, no sonographic findings of tendinopathy or effusion ?Samsung HS60 device utilized with permanent recording / reporting. ?Verbal informed consent obtained and verified. ?Skin prepped in a sterile fashion. ?Ethyl chloride for topical local analgesia.  ?Completed without difficulty and tolerated well. ?Medication: triamcinolone acetonide 40 mg/mL suspension for injection 1 mL total and 2 mL lidocaine 1% without epinephrine utilized for needle placement anesthetic ?Advised to contact for fevers/chills, erythema, induration, drainage, or persistent bleeding. ? ?Procedure:  Injection of left sacroiliac joint under ultrasound guidance. ?Ultrasound guidance utilized for in-plane approach to the left SI joint medial-lateral, no effusion noted,  hypoechoic response from injectate visualized ?Samsung HS60 device utilized with permanent recording / reporting. ?Verbal informed consent obtained and verified. ?Skin prepped in a sterile fashion. ?Ethyl chloride for topical local analgesia.  ?Completed without  difficulty and tolerated well. ?Medication: triamcinolone acetonide 40 mg/mL suspension for injection 1 mL total and 2 mL lidocaine 1% without epinephrine utilized for needle placement anesthetic ?Advised to contact for fevers/chills, erythema, induration, drainage, or persistent bleeding. ? ?Procedure:  Injection of left piriformis episodic nerve junction under ultrasound guidance. ?Ultrasound guidance utilized for in-plane approach to inject the junction of the piriformis and sciatic nerve, no abnormalities to muscular morphology noted sonographically ?Samsung HS60 device utilized with permanent recording / reporting. ?Verbal informed consent obtained and verified. ?Skin prepped in a sterile fashion. ?Ethyl chloride for topical local analgesia.  ?Completed without difficulty and tolerated well. ?Medication: triamcinolone acetonide 40 mg/mL suspension for injection 1 mL total and 2 mL lidocaine 1% without epinephrine utilized for needle placement anesthetic ?Advised to contact for fevers/chills, erythema, induration, drainage, or persistent bleeding. ? ? ?Pertinent History, Exam, Impression, and Recommendations:  ? ?Piriformis syndrome, left ?Patient presents for follow-up to left piriformis syndrome in the setting of lumbosacral spondylosis and associated deconditioning throughout the deep hip/core musculature.  While she has noted mild improvement from diclofenac that she was able to initiate, given her persistent symptomatology we did discuss additional treatment strategies and she did elect to proceed with ultrasound-guided piriformis/static nerve corticosteroid injection.  Post care reviewed and recommendation for PT advised.  She can dose additional  diclofenac on as-needed basis until symptoms respond to cortisone. ? ?Chronic condition, symptomatic, Rx management ? ?SI (sacroiliac) pain ?Patient presents for follow-up to chronic left hip pain in the setting of lumbosacral spondylosis and left trochanteric pain syndrome.  While she has tolerated diclofenac well, she continues to be symptomatic with focal findings of the left SI joint on examination today.  We discussed additional treatment options and she did elect to proceed with ultrasound-guided left SI joint injection.  She tolerated this well, post care reviewed, as needed diclofenac advised from a medication management standpoint, and recommendation for formal PT. ? ?Chronic condition, symptomatic, Rx management ? ?Greater trochanteric pain syndrome of left lower extremity ?Patient with chronic left lateral hip pain in the setting of MRI evident gluteus medius tendinopathy felt cold about the greater trochanter on the left, additionally lumbosacral with multilevel spondylosis.  She was able to initiate diclofenac with moderate response.  Given her persistent symptomatology we discussed additional treatment strategies and she did elect to proceed with ultrasound-guided corticosteroid injection to the left greater trochanter.  She tolerated this well, post care reviewed, transition to as needed diclofenac advised until symptoms respond to cortisone, and recommendation for formal PT provided. ? ?Chronic condition, symptomatic, Rx management ?  ? ?Orders & Medications ?Meds ordered this encounter  ?Medications  ? DISCONTD: triamcinolone acetonide (KENALOG-40) injection 40 mg  ? triamcinolone acetonide (KENALOG-40) injection 120 mg  ? ?Orders Placed This Encounter  ?Procedures  ? [place] LIMITED JOINT SPACE STRUCTURES LOW LEFT  ?  ? ?No follow-ups on file.  ?  ? ?Jerrol Banana, MD ? ? Primary Care Sports Medicine ?[facility] ?[facility]  ? ?

## 2021-09-08 ENCOUNTER — Other Ambulatory Visit: Payer: Self-pay | Admitting: Family Medicine

## 2021-09-10 NOTE — Telephone Encounter (Signed)
Requested Prescriptions  ?Pending Prescriptions Disp Refills  ?? citalopram (CELEXA) 40 MG tablet [Pharmacy Med Name: CITALOPRAM HBR 40 MG TABLET] 90 tablet 0  ?  Sig: TAKE 1 TABLET BY MOUTH EVERYDAY AT BEDTIME  ?  ? Psychiatry:  Antidepressants - SSRI Passed - 09/08/2021  3:34 AM  ?  ?  Passed - Completed PHQ-2 or PHQ-9 in the last 360 days  ?  ?  Passed - Valid encounter within last 6 months  ?  Recent Outpatient Visits   ?      ? 1 week ago Greater trochanteric pain syndrome of left lower extremity  ? [facility] Jerrol Banana, MD  ? 2 weeks ago Greater trochanteric pain syndrome of left lower extremity  ? [facility] Jerrol Banana, MD  ? 1 month ago Left leg pain  ? [facility] [place], [place] P, DO  ? 3 months ago Left hip pain  ? [facility] [place], Megan P, DO  ? 7 months ago Cough  ? [facility] [place], [place] P, DO  ?  ?  ?Future Appointments   ?        ? In 2 weeks Laural Benes, Oralia Rud, DO [facility]  ?  ? ?  ?  ?  ? ?

## 2021-09-19 ENCOUNTER — Encounter: Payer: Self-pay | Admitting: Family Medicine

## 2021-09-19 DIAGNOSIS — M25552 Pain in left hip: Secondary | ICD-10-CM

## 2021-09-19 DIAGNOSIS — N809 Endometriosis, unspecified: Secondary | ICD-10-CM | POA: Diagnosis not present

## 2021-09-19 DIAGNOSIS — N83291 Other ovarian cyst, right side: Secondary | ICD-10-CM | POA: Diagnosis not present

## 2021-09-23 NOTE — Progress Notes (Signed)
? ?BP 92/67   Pulse 77   Temp 97.8 ?F (36.6 ?C)   Ht 5' 7.5" (1.715 m)   Wt (!) 306 lb 6.4 oz (139 kg)   SpO2 97%   BMI 47.28 kg/m?   ? ?Subjective:  ? ? Patient ID: Kimberly HawthorneJessica B Santiago, female    DOB: 1976-06-08, 46 y.o.   MRN: [id number] ? ?HPI: ?Kimberly HawthorneJessica B Santiago is a 46 y.o. female presenting on 09/24/2021 for comprehensive medical examination. Current medical complaints include: ? ?Kimberly BumpsJessica has a cyst on her ovary, to have a R oophorectomy with OB/GYN. Not scheduled yet. Has had several surgeries in the past. Her last surgery was a c-section 2 years ago. Last surgery with general anesthesia was in 2019 when she had sinus surgery. She has always done well with surgery. She does have post-op n/v, usually well controlled with a patch. She has never had any issues with extubation. No additional problems with anesthesia. No family history of problems with anesthesia. No family history of maliganant hyperthermia. She is able to walk up 2 flights of stairs without any issues. She has been feeling well. No SOB, or CP. She is otherwise feeling well with no other concerns. ? ?DEPRESSION ?Mood status: controlled ?Satisfied with current treatment?: yes ?Symptom severity: mild  ?Duration of current treatment : chronic ?Side effects: no ?Medication compliance: excellent compliance ?Psychotherapy/counseling: no  ?Previous psychiatric medications: celexa ?Depressed mood: no ?Anxious mood: yes ?Anhedonia: no ?Significant weight loss or gain: no ?Insomnia: no  ?Fatigue: yes ?Feelings of worthlessness or guilt: no ?Impaired concentration/indecisiveness: no ?Suicidal ideations: no ?Hopelessness: no ?Crying spells: no ? ?  09/24/2021  ?  8:23 AM 08/31/2021  ? 10:54 AM 08/23/2021  ? 10:06 AM 09/18/2020  ?  8:20 AM 09/16/2019  ? 11:35 AM  ?Depression screen PHQ 2/9  ?Decreased Interest 1 0 1 0 0  ?Down, Depressed, Hopeless 1 1 1  0 0  ?PHQ - 2 Score 2 1 2  0 0  ?Altered sleeping 1 1 1  0 0  ?Tired, decreased energy 1 1 1 3  0  ?Change in appetite  1 1 1 1  0  ?Feeling bad or failure about yourself  1 1 1  0 0  ?Trouble concentrating 0 0 1 0 0  ?Moving slowly or fidgety/restless 0 0 0 0 0  ?Suicidal thoughts 0 0 0 0 0  ?PHQ-9 Score 6 5 7 4  0  ?Difficult doing work/chores  Not difficult at all Somewhat difficult Not difficult at all   ? ?She currently lives with: husband and kids ?Menopausal Symptoms: no ? ?Depression Screen done today and results listed below:  ? ?  09/24/2021  ?  8:23 AM 08/31/2021  ? 10:54 AM 08/23/2021  ? 10:06 AM 09/18/2020  ?  8:20 AM 09/16/2019  ? 11:35 AM  ?Depression screen PHQ 2/9  ?Decreased Interest 1 0 1 0 0  ?Down, Depressed, Hopeless 1 1 1  0 0  ?PHQ - 2 Score 2 1 2  0 0  ?Altered sleeping 1 1 1  0 0  ?Tired, decreased energy 1 1 1 3  0  ?Change in appetite 1 1 1 1  0  ?Feeling bad or failure about yourself  1 1 1  0 0  ?Trouble concentrating 0 0 1 0 0  ?Moving slowly or fidgety/restless 0 0 0 0 0  ?Suicidal thoughts 0 0 0 0 0  ?PHQ-9 Score 6 5 7 4  0  ?Difficult doing work/chores  Not difficult at all Somewhat difficult Not difficult at  all   ? ? ?Past Medical History:  ?Past Medical History:  ?Diagnosis Date  ? Anemia   ? Asthma   ? Barrett esophagus   ? Complication of anesthesia   ? Pt stopped breathing during EGD but did well with recent Gastric Bypass Surgery  ? Depression   ? GERD (gastroesophageal reflux disease)   ? H/O  ? Headache   ? H/O MIGRAINES  ? History of hiatal hernia   ? SMALL  ? PCOS (polycystic ovarian syndrome)   ? PONV (postoperative nausea and vomiting)   ? Sleep apnea   ? (mild) PT IS NOT USING CPAP  ? Status post repeat low transverse cesarean section 07/27/2019  ? ? ?Surgical History:  ?Past Surgical History:  ?Procedure Laterality Date  ? CESAREAN SECTION N/A 08/18/2017  ? Procedure: Primary CESAREAN SECTION;  Surgeon: Noland Fordyce, MD;  Location: [facility];  Service: Obstetrics;  Laterality: N/A;  EDD: 09/13/17 ?Allergy: Amoxicillin, Morphine, Hydrocodone  ? CESAREAN SECTION WITH BILATERAL TUBAL LIGATION  Bilateral 07/27/2019  ? Procedure: Repeat CESAREAN SECTION WITH BILATERAL TUBAL LIGATION;  Surgeon: Noland Fordyce, MD;  Location: [facility];  Service: Obstetrics;  Laterality: Bilateral;  EDD: 07/31/19  ? CHOLECYSTECTOMY    ? COLONOSCOPY WITH PROPOFOL N/A 07/07/2020  ? Procedure: COLONOSCOPY WITH PROPOFOL;  Surgeon: Midge Minium, MD;  Location: [facility];  Service: Endoscopy;  Laterality: N/A;  ? DIAGNOSTIC LAPAROSCOPY    ? EAR CYST EXCISION N/A 11/24/2014  ? Procedure: CYST REMOVAL;  Surgeon: Bud Face, MD;  Location: [facility] ORS;  Service: ENT;  Laterality: N/A;  upper lip  ? ESOPHAGOGASTRODUODENOSCOPY    ? ESOPHAGOGASTRODUODENOSCOPY (EGD) WITH PROPOFOL N/A 07/07/2020  ? Procedure: ESOPHAGOGASTRODUODENOSCOPY (EGD) WITH PROPOFOL;  Surgeon: Midge Minium, MD;  Location: [facility];  Service: Endoscopy;  Laterality: N/A;  sleep apnea  ? ETHMOIDECTOMY Right 05/07/2018  ? Procedure: ETHMOIDECTOMY;  Surgeon: Bud Face, MD;  Location: [facility] ORS;  Service: ENT;  Laterality: Right;  ? FRONTAL SINUS EXPLORATION Right 05/07/2018  ? Procedure: FRONTAL SINUS EXPLORATION;  Surgeon: Bud Face, MD;  Location: [facility] ORS;  Service: ENT;  Laterality: Right;  ? GASTRIC BYPASS  March 2016  ? IMAGE GUIDED SINUS SURGERY N/A 05/07/2018  ? Procedure: IMAGE GUIDED SINUS SURGERY;  Surgeon: Bud Face, MD;  Location: [facility] ORS;  Service: ENT;  Laterality: N/A;  ? KNEE ARTHROSCOPY Right   ? MAXILLARY ANTROSTOMY Bilateral 05/07/2018  ? Procedure: MAXILLARY ANTROSTOMY;  Surgeon: Bud Face, MD;  Location: [facility] ORS;  Service: ENT;  Laterality: Bilateral;  ? NASAL SEPTOPLASTY W/ TURBINOPLASTY Bilateral 05/07/2018  ? Procedure: NASAL SEPTOPLASTY WITH TURBINATE REDUCTION;  Surgeon: Bud Face, MD;  Location: [facility] ORS;  Service: ENT;  Laterality: Bilateral;  ? SPHENOIDECTOMY Right 05/07/2018  ? Procedure: SPHENOIDECTOMY;  Surgeon: Bud Face, MD;  Location: [facility] ORS;  Service: ENT;  Laterality:  Right;  ? TEMPOROMANDIBULAR JOINT SURGERY    ? WISDOM TOOTH EXTRACTION    ? ? ?Medications:  ?Current Outpatient Medications on File Prior to Visit  ?Medication Sig  ? acetaminophen (TYLENOL) 325 MG tablet Take 650 mg by mouth every 6 (six) hours as needed for mild pain or headache.  ? busPIRone (BUSPAR) 7.5 MG tablet Take 7.5 mg by mouth at bedtime.  ? cyclobenzaprine (FLEXERIL) 10 MG tablet Take 1 tablet (10 mg total) by mouth at bedtime.  ? diclofenac (VOLTAREN) 50 MG EC tablet Take 1 tablet (50 mg total) by mouth 2 (two) times daily.  ? diclofenac  Sodium (VOLTAREN) 1 % GEL Apply 4 g topically 4 (four) times daily.  ? fexofenadine (ALLEGRA) 180 MG tablet Take 180 mg by mouth daily.  ? gabapentin (NEURONTIN) 100 MG capsule Take 1-3 capsules (100-300 mg total) by mouth at bedtime.  ? nitrofurantoin (MACRODANTIN) 100 MG capsule Take 1 capsule (100 mg total) by mouth daily.  ? nystatin (MYCOSTATIN/NYSTOP) powder Apply 1 application topically 3 (three) times daily.  ? ORILISSA 150 MG TABS Take 1 tablet by mouth daily.  ? traMADol (ULTRAM) 50 MG tablet Take 1 tablet (50 mg total) by mouth every 6 (six) hours as needed.  ? [DISCONTINUED] fluticasone (FLONASE) 50 MCG/ACT nasal spray Place 2 sprays into both nostrils at bedtime.  ? ?No current facility-administered medications on file prior to visit.  ? ? ?Allergies:  ?Allergies  ?Allergen Reactions  ? Hydromorphone Shortness Of Breath, Nausea And Vomiting and Other (See Comments)  ?  Chest pain  ? Hydrocodone Nausea And Vomiting and Other (See Comments)  ?  Headache, Patient can take oxycodone.  ? Morphine And Related Nausea And Vomiting and Other (See Comments)  ?  Headache  ? Nsaids Other (See Comments)  ?  Pt cannot take due to Gastric Bypass Surgery  ? Amoxicillin Itching  ?  Can take cephalexin. ?Did it involve swelling of the face/tongue/throat, SOB, or low BP? No ?Did it involve sudden or severe rash/hives, skin peeling, or any reaction on the inside of your mouth  or nose? No ?Did you need to seek medical attention at a hospital or doctor's office? No ?When did it last happen?  Several years     ?If all above answers are ?NO?, may proceed with cephalosporin use. ?  ? ?

## 2021-09-24 ENCOUNTER — Other Ambulatory Visit: Payer: Self-pay

## 2021-09-24 ENCOUNTER — Ambulatory Visit (INDEPENDENT_AMBULATORY_CARE_PROVIDER_SITE_OTHER): Payer: BC Managed Care – PPO | Admitting: Family Medicine

## 2021-09-24 ENCOUNTER — Other Ambulatory Visit: Payer: Self-pay | Admitting: Family Medicine

## 2021-09-24 ENCOUNTER — Encounter: Payer: Self-pay | Admitting: Family Medicine

## 2021-09-24 VITALS — BP 92/67 | HR 77 | Temp 97.8°F | Ht 67.5 in | Wt 306.4 lb

## 2021-09-24 DIAGNOSIS — Z01818 Encounter for other preprocedural examination: Secondary | ICD-10-CM | POA: Diagnosis not present

## 2021-09-24 DIAGNOSIS — M79605 Pain in left leg: Secondary | ICD-10-CM | POA: Diagnosis not present

## 2021-09-24 DIAGNOSIS — R112 Nausea with vomiting, unspecified: Secondary | ICD-10-CM | POA: Insufficient documentation

## 2021-09-24 DIAGNOSIS — R739 Hyperglycemia, unspecified: Secondary | ICD-10-CM | POA: Diagnosis not present

## 2021-09-24 DIAGNOSIS — Z0000 Encounter for general adult medical examination without abnormal findings: Secondary | ICD-10-CM | POA: Diagnosis not present

## 2021-09-24 DIAGNOSIS — F331 Major depressive disorder, recurrent, moderate: Secondary | ICD-10-CM

## 2021-09-24 DIAGNOSIS — Z6841 Body Mass Index (BMI) 40.0 and over, adult: Secondary | ICD-10-CM | POA: Diagnosis not present

## 2021-09-24 DIAGNOSIS — Z136 Encounter for screening for cardiovascular disorders: Secondary | ICD-10-CM | POA: Diagnosis not present

## 2021-09-24 DIAGNOSIS — Z9889 Other specified postprocedural states: Secondary | ICD-10-CM

## 2021-09-24 DIAGNOSIS — F419 Anxiety disorder, unspecified: Secondary | ICD-10-CM | POA: Diagnosis not present

## 2021-09-24 LAB — URINALYSIS, ROUTINE W REFLEX MICROSCOPIC
Bilirubin, UA: NEGATIVE
Glucose, UA: NEGATIVE
Ketones, UA: NEGATIVE
Leukocytes,UA: NEGATIVE
Nitrite, UA: NEGATIVE
Protein,UA: NEGATIVE
RBC, UA: NEGATIVE
Specific Gravity, UA: 1.025 (ref 1.005–1.030)
Urobilinogen, Ur: 0.2 mg/dL (ref 0.2–1.0)
pH, UA: 6.5 (ref 5.0–7.5)

## 2021-09-24 LAB — BAYER DCA HB A1C WAIVED: HB A1C (BAYER DCA - WAIVED): 4.9 % (ref 4.8–5.6)

## 2021-09-24 LAB — COAGUCHEK XS/INR WAIVED
INR: 1.1 (ref 0.9–1.1)
Prothrombin Time: 12.7 s

## 2021-09-24 MED ORDER — CITALOPRAM HYDROBROMIDE 40 MG PO TABS: 40.0000 mg | ORAL_TABLET | Freq: Every day | ORAL | 1 refills | Status: DC

## 2021-09-24 MED ORDER — FAMOTIDINE 20 MG PO TABS: 20.0000 mg | ORAL_TABLET | Freq: Every day | ORAL | 3 refills | Status: AC

## 2021-09-24 MED ORDER — PANTOPRAZOLE SODIUM 40 MG PO TBEC
40.0000 mg | DELAYED_RELEASE_TABLET | Freq: Two times a day (BID) | ORAL | 1 refills | Status: DC
Start: 2021-09-24 — End: 2021-10-15

## 2021-09-24 NOTE — Assessment & Plan Note (Signed)
Under good control on current regimen. Continue current regimen. Continue to monitor. Call with any concerns. Refills given.   

## 2021-09-24 NOTE — Assessment & Plan Note (Signed)
Encouraged patient to make sure her anesthesiologist knows about this prior to surgery. ?

## 2021-09-24 NOTE — Progress Notes (Signed)
Interpreted by me today. NSR at 67bpm, No ST segment changes

## 2021-09-24 NOTE — Assessment & Plan Note (Signed)
Stable. Continue diet and exercise with goal of losing 1-2lbs per week.  ?

## 2021-09-25 LAB — HEPATITIS C ANTIBODY: Hep C Virus Ab: NONREACTIVE

## 2021-09-25 LAB — CBC WITH DIFFERENTIAL/PLATELET
Basophils Absolute: 0.1 10*3/uL (ref 0.0–0.2)
Basos: 1 %
EOS (ABSOLUTE): 0.2 10*3/uL (ref 0.0–0.4)
Eos: 4 %
Hematocrit: 39.3 % (ref 34.0–46.6)
Hemoglobin: 12.4 g/dL (ref 11.1–15.9)
Immature Grans (Abs): 0 10*3/uL (ref 0.0–0.1)
Immature Granulocytes: 0 %
Lymphocytes Absolute: 1.8 10*3/uL (ref 0.7–3.1)
Lymphs: 30 %
MCH: 26.4 pg — ABNORMAL LOW (ref 26.6–33.0)
MCHC: 31.6 g/dL (ref 31.5–35.7)
MCV: 84 fL (ref 79–97)
Monocytes Absolute: 0.5 10*3/uL (ref 0.1–0.9)
Monocytes: 9 %
Neutrophils Absolute: 3.4 10*3/uL (ref 1.4–7.0)
Neutrophils: 56 %
Platelets: 297 10*3/uL (ref 150–450)
RBC: 4.69 x10E6/uL (ref 3.77–5.28)
RDW: 13.5 % (ref 11.7–15.4)
WBC: 5.9 10*3/uL (ref 3.4–10.8)

## 2021-09-25 LAB — COMPREHENSIVE METABOLIC PANEL
ALT: 24 IU/L (ref 0–32)
AST: 15 IU/L (ref 0–40)
Albumin/Globulin Ratio: 1.2 (ref 1.2–2.2)
Albumin: 3.4 g/dL — ABNORMAL LOW (ref 3.8–4.8)
Alkaline Phosphatase: 81 IU/L (ref 44–121)
BUN/Creatinine Ratio: 14 (ref 9–23)
BUN: 11 mg/dL (ref 6–24)
Bilirubin Total: 0.3 mg/dL (ref 0.0–1.2)
CO2: 26 mmol/L (ref 20–29)
Calcium: 8.7 mg/dL (ref 8.7–10.2)
Chloride: 104 mmol/L (ref 96–106)
Creatinine, Ser: 0.77 mg/dL (ref 0.57–1.00)
Globulin, Total: 2.9 g/dL (ref 1.5–4.5)
Glucose: 83 mg/dL (ref 70–99)
Potassium: 4 mmol/L (ref 3.5–5.2)
Sodium: 140 mmol/L (ref 134–144)
Total Protein: 6.3 g/dL (ref 6.0–8.5)
eGFR: 97 mL/min/{1.73_m2} (ref >59)

## 2021-09-25 LAB — TSH: TSH: 1.83 u[IU]/mL (ref 0.450–4.500)

## 2021-09-25 LAB — LIPID PANEL W/O CHOL/HDL RATIO
Cholesterol, Total: 169 mg/dL (ref 100–199)
HDL: 68 mg/dL (ref >39)
LDL Chol Calc (NIH): 92 mg/dL (ref 0–99)
Triglycerides: 41 mg/dL (ref 0–149)
VLDL Cholesterol Cal: 9 mg/dL (ref 5–40)

## 2021-09-25 LAB — D-DIMER, QUANTITATIVE: D-DIMER: 0.48 mg/L FEU (ref 0.00–0.49)

## 2021-09-25 NOTE — Telephone Encounter (Signed)
Requested medication (s) are due for refill today: no ? ?Requested medication (s) are on the active medication list: yes ? ?Last refill:  yesterday ? ?Future visit scheduled: yes ? ?Notes to clinic:  see pharmacy request  ?Pharmacy comment: Alternative Requested:PRIOR AUTHORIZATION FOR 2 TABLETS DAILY. ? ? ?  ?Requested Prescriptions  ?Pending Prescriptions Disp Refills  ? pantoprazole (PROTONIX) 40 MG tablet [Pharmacy Med Name: PANTOPRAZOLE SOD DR 40 MG TAB] 180 tablet 1  ?  Sig: TAKE 1 TABLET BY MOUTH TWICE A DAY  ?  ? Gastroenterology: Proton Pump Inhibitors Passed - 09/24/2021 11:35 AM  ?  ?  Passed - Valid encounter within last 12 months  ?  Recent Outpatient Visits   ? ?      ? Yesterday Routine general medical examination at a health care facility  ? [facility] [place], Megan P, DO  ? 3 weeks ago Greater trochanteric pain syndrome of left lower extremity  ? [facility] Jerrol Banana, MD  ? 1 month ago Greater trochanteric pain syndrome of left lower extremity  ? [facility] Jerrol Ban[facility], MD  ? 1 month ago Left leg pain  ? [facility] [place], [place] P, DO  ? 3 months ago Left hip pain  ? [facility] [place], [place] P, DO  ? ?  ?  ?Future Appointments   ? ?        ? In 1 year Laural Benes, Oralia Rud, DO [facility]  ? ?  ? ?  ?  ?  ? ?

## 2021-09-25 NOTE — Progress Notes (Signed)
Addendum 09/25/21 4:51PM: labs normal. Low risk for surgery. Will send to GYN ?

## 2021-09-28 ENCOUNTER — Encounter: Payer: Self-pay | Admitting: Family Medicine

## 2021-10-15 ENCOUNTER — Other Ambulatory Visit: Payer: Self-pay | Admitting: Family Medicine

## 2021-10-15 NOTE — Telephone Encounter (Signed)
Requested Prescriptions  ?Pending Prescriptions Disp Refills  ?? pantoprazole (PROTONIX) 40 MG tablet [Pharmacy Med Name: PANTOPRAZOLE SOD DR 40 MG TAB] 90 tablet 3  ?  Sig: TAKE 1 TABLET BY MOUTH EVERY DAY  ?  ? Gastroenterology: Proton Pump Inhibitors Passed - 10/15/2021  2:31 AM  ?  ?  Passed - Valid encounter within last 12 months  ?  Recent Outpatient Visits   ?      ? 3 weeks ago Routine general medical examination at a health care facility  ? [facility] [place], Megan P, DO  ? 1 month ago Greater trochanteric pain syndrome of left lower extremity  ? [facility] Jerrol Banana, MD  ? 1 month ago Greater trochanteric pain syndrome of left lower extremity  ? [facility] Jerrol Banana, MD  ? 2 months ago Left leg pain  ? [facility] [place], Megan P, DO  ? 4 months ago Left hip pain  ? [facility] [place], [place] P, DO  ?  ?  ?Future Appointments   ?        ? In 11 months Dorcas Carrow, DO [facility]  ?  ? ?  ?  ?  ? ? ?

## 2021-10-19 DIAGNOSIS — K449 Diaphragmatic hernia without obstruction or gangrene: Secondary | ICD-10-CM | POA: Diagnosis not present

## 2021-10-19 DIAGNOSIS — G43009 Migraine without aura, not intractable, without status migrainosus: Secondary | ICD-10-CM | POA: Diagnosis not present

## 2021-10-19 DIAGNOSIS — R1013 Epigastric pain: Secondary | ICD-10-CM | POA: Diagnosis not present

## 2021-10-19 DIAGNOSIS — K219 Gastro-esophageal reflux disease without esophagitis: Secondary | ICD-10-CM | POA: Diagnosis not present

## 2021-10-19 DIAGNOSIS — R110 Nausea: Secondary | ICD-10-CM | POA: Diagnosis not present

## 2021-10-19 DIAGNOSIS — N809 Endometriosis, unspecified: Secondary | ICD-10-CM | POA: Diagnosis not present

## 2021-10-19 DIAGNOSIS — Z888 Allergy status to other drugs, medicaments and biological substances status: Secondary | ICD-10-CM | POA: Diagnosis not present

## 2021-10-19 DIAGNOSIS — J45909 Unspecified asthma, uncomplicated: Secondary | ICD-10-CM | POA: Diagnosis not present

## 2021-10-19 DIAGNOSIS — E669 Obesity, unspecified: Secondary | ICD-10-CM | POA: Diagnosis not present

## 2021-10-19 DIAGNOSIS — E282 Polycystic ovarian syndrome: Secondary | ICD-10-CM | POA: Diagnosis not present

## 2021-10-19 DIAGNOSIS — Z6841 Body Mass Index (BMI) 40.0 and over, adult: Secondary | ICD-10-CM | POA: Diagnosis not present

## 2021-10-19 DIAGNOSIS — Z886 Allergy status to analgesic agent status: Secondary | ICD-10-CM | POA: Diagnosis not present

## 2021-10-19 DIAGNOSIS — D649 Anemia, unspecified: Secondary | ICD-10-CM | POA: Diagnosis not present

## 2021-10-19 DIAGNOSIS — R1012 Left upper quadrant pain: Secondary | ICD-10-CM | POA: Diagnosis not present

## 2021-10-19 DIAGNOSIS — N83201 Unspecified ovarian cyst, right side: Secondary | ICD-10-CM | POA: Diagnosis not present

## 2021-10-19 DIAGNOSIS — M79662 Pain in left lower leg: Secondary | ICD-10-CM | POA: Diagnosis not present

## 2021-10-19 DIAGNOSIS — Z885 Allergy status to narcotic agent status: Secondary | ICD-10-CM | POA: Diagnosis not present

## 2021-10-19 DIAGNOSIS — G4733 Obstructive sleep apnea (adult) (pediatric): Secondary | ICD-10-CM | POA: Diagnosis not present

## 2021-10-19 DIAGNOSIS — Z881 Allergy status to other antibiotic agents status: Secondary | ICD-10-CM | POA: Diagnosis not present

## 2021-10-19 DIAGNOSIS — M79605 Pain in left leg: Secondary | ICD-10-CM | POA: Diagnosis not present

## 2021-10-19 DIAGNOSIS — F419 Anxiety disorder, unspecified: Secondary | ICD-10-CM | POA: Diagnosis not present

## 2021-10-19 DIAGNOSIS — Z9884 Bariatric surgery status: Secondary | ICD-10-CM | POA: Diagnosis not present

## 2021-10-20 DIAGNOSIS — M79605 Pain in left leg: Secondary | ICD-10-CM | POA: Diagnosis not present

## 2021-10-20 DIAGNOSIS — N809 Endometriosis, unspecified: Secondary | ICD-10-CM | POA: Diagnosis not present

## 2021-10-20 DIAGNOSIS — N83201 Unspecified ovarian cyst, right side: Secondary | ICD-10-CM | POA: Diagnosis not present

## 2021-10-20 DIAGNOSIS — R1013 Epigastric pain: Secondary | ICD-10-CM | POA: Diagnosis not present

## 2021-10-20 DIAGNOSIS — K449 Diaphragmatic hernia without obstruction or gangrene: Secondary | ICD-10-CM | POA: Diagnosis not present

## 2021-10-22 ENCOUNTER — Ambulatory Visit: Payer: BC Managed Care – PPO | Admitting: Urology

## 2021-10-23 ENCOUNTER — Other Ambulatory Visit: Payer: Self-pay | Admitting: Obstetrics

## 2021-10-24 ENCOUNTER — Telehealth: Payer: Self-pay | Admitting: *Deleted

## 2021-10-24 NOTE — Telephone Encounter (Signed)
Transition Care Management Follow-up Telephone Call  ?Date of discharge and from where: [facility] ?How have you been since you were released from the hospital? Leg is feeling much better still having intermittent abdominal pain ?Any questions or concerns? No ? ?Items Reviewed: ?Did the pt receive and understand the discharge instructions provided? Yes  ?Medications obtained and verified? Yes  ?Other? No  ?Any new allergies since your discharge? No  ?Dietary orders reviewed? No ?Do you have support at home? Yes  ? ?Home Care and Equipment/Supplies: ?Were home health services ordered?  ?  ?Has the agency set up a time to come to the patient's home?  ?Were any new equipment or medical supplies ordered?   ?What is the name of the medical supply agency?  ?Were you able to get the supplies/equipment?  ?Do you have any questions related to the use of the equipment or supplies?  ? ?Functional Questionnaire: (I = Independent and D = Dependent) ?ADLs: I ? ?Bathing/Dressing- I ? ?Meal Prep- I ? ?Eating- I ? ?Maintaining continence- I ? ?Transferring/Ambulation- I ? ?Managing Meds- I ? ?Follow up appointments reviewed: ? ?PCP Hospital f/u appt confirmed? Yes  Scheduled to see 10-30-2021 on 4:20. ?Specialist Hospital f/u appt confirmed? No   ?Are transportation arrangements needed? No  ?If their condition worsens, is the pt aware to call PCP or go to the Emergency Dept.? Yes ?Was the patient provided with contact information for the PCP's office or ED? Yes ?Was to pt encouraged to call back with questions or concerns? Yes  ?

## 2021-10-29 DIAGNOSIS — E1169 Type 2 diabetes mellitus with other specified complication: Secondary | ICD-10-CM | POA: Diagnosis not present

## 2021-10-29 DIAGNOSIS — E1159 Type 2 diabetes mellitus with other circulatory complications: Secondary | ICD-10-CM | POA: Diagnosis not present

## 2021-10-29 DIAGNOSIS — I152 Hypertension secondary to endocrine disorders: Secondary | ICD-10-CM | POA: Diagnosis not present

## 2021-10-29 DIAGNOSIS — M5431 Sciatica, right side: Secondary | ICD-10-CM | POA: Diagnosis not present

## 2021-10-29 DIAGNOSIS — F419 Anxiety disorder, unspecified: Secondary | ICD-10-CM | POA: Diagnosis not present

## 2021-10-30 ENCOUNTER — Ambulatory Visit (INDEPENDENT_AMBULATORY_CARE_PROVIDER_SITE_OTHER): Payer: BC Managed Care – PPO | Admitting: Family Medicine

## 2021-10-30 ENCOUNTER — Encounter: Payer: Self-pay | Admitting: Family Medicine

## 2021-10-30 VITALS — BP 87/60 | HR 65 | Temp 97.7°F | Wt 310.0 lb

## 2021-10-30 DIAGNOSIS — Z6841 Body Mass Index (BMI) 40.0 and over, adult: Secondary | ICD-10-CM

## 2021-10-30 DIAGNOSIS — R1013 Epigastric pain: Secondary | ICD-10-CM | POA: Diagnosis not present

## 2021-10-30 MED ORDER — PANTOPRAZOLE SODIUM 40 MG PO TBEC: 40.0000 mg | DELAYED_RELEASE_TABLET | Freq: Two times a day (BID) | ORAL | 3 refills | Status: DC

## 2021-10-30 NOTE — Assessment & Plan Note (Signed)
Would like to consider medication. Will discuss after recovery from surgery in July/August. ?

## 2021-10-30 NOTE — Progress Notes (Signed)
? ?BP (!) 87/60   Pulse 65   Temp 97.7 ?F (36.5 ?C)   Wt (!) 310 lb (140.6 kg)   SpO2 97%   BMI 47.84 kg/m?   ? ?Subjective:  ? ? Patient ID: Kimberly Santiago, female    DOB: [birth year], 46 y.o.   MRN: [id number] ? ?HPI: ?Kimberly Santiago is a 46 y.o. female ? ?Chief Complaint  ?Patient presents with  ? Abdominal Pain  ?  Patient was recently seen in ED on 10/20/21 for upper abdominal pain. Patient states she is still having pain its about the same. Patient feels bloated and burping a lot, also has some nausea. Patient states pain worsens during meals and drinking.   ? ?ER FOLLOW UP ?Time since discharge: 10 days ?Hospital/facility: [facility]  ?Diagnosis: Abdominal pain and pain of L calf ?Procedures/tests:  ?Ordered Reason For Exam: r.o DVT ?Indication: Pain ?Risk Factors: DVT (per pt she had h/o superficial thrombus several years ao and was not put on blood thiners.) and (obesity). ?Anticoagulation: (Unknown). ?Protocol ?The major deep veins from the inguinal ligament to the ankle are assessed for compressibility and color and spectral Doppler flow characteristics on the requested limb. The assessed veins include common femoral vein, femoral vein in the thigh, popliteal vein, and intramuscular calf veins. The iliac vein is assessed indirectly using Doppler waveform analysis. The great saphenous vein is assessed for compressibility at the saphenofemoral junction, and the small saphenous vein assessed for compressibility behind the knee. A contralateral PW Doppler waveform is obtained for comparison. ?Limitations: Body habitus and poor ultrasound/tissue interface. ? ?Right Duplex Findings ?CFV Doppler waveform appears appropriately phasic. ? ?Left Duplex Findings ?All veins visualized appear fully compressible. Doppler flow signals demonstrate normal spontaneity, phasicity, and augmentation. ?Not visualized segments included the proximal peroneal veins. ? ?Right Technical Summary ?No evidence of iliofemoral  obstruction. ? ?Left Technical Summary ?No evidence of deep venous obstruction in the lower extremity. No indirect evidence of obstruction proximal to the inguinal ligament. ?Not visualized segments included the proximal peroneal veins. ? ?Final Interpretation ?Right ?There is no evidence of obstruction proximal to the inguinal ligament or in the common femoral vein. ?Left ?There is no evidence of DVT in the lower extremity. However, portions of this examination were limited- see technologist comments above. ?EXAM: CT ABDOMEN PELVIS W CONTRAST ?DATE: 10/20/2021 7:28 AM ?ACCESSION: [id number] ?DICTATED: 10/20/2021 7:46 AM ?INTERPRETATION LOCATION: [facility] ? ?CLINICAL INDICATION: 46 years old with hx of gastric bypass, upper abdominal pain   ? ?COMPARISON: CT abdomen pelvis and endovaginal ultrasound 03/14/2020 ? ?TECHNIQUE: A helical CT scan of the abdomen and pelvis was obtained following IV contrast from the lung bases through the pubic symphysis. Images were reconstructed in the axial plane. Coronal and sagittal reformatted images were also provided for further evaluation. ? ? ?FINDINGS:  ? ?LOWER CHEST: No pleural effusion. No pericardial effusion. ? ?LIVER: Normal liver contour.  No focal liver lesions.  ? ?BILIARY: The gallbladder is surgically absent. Mild intrahepatic biliary duct dilation, likely secondary to post cholecystectomy state. ? ?SPLEEN: Normal in size and contour. ? ?PANCREAS: Normal pancreatic contour.  No focal lesions.  No ductal dilation. ? ?ADRENAL GLANDS: Normal appearance of the adrenal glands. ? ?KIDNEYS/URETERS: Symmetric renal enhancement.  No hydronephrosis.  No solid renal mass. ? ?BLADDER: Mildly distended. ? ?REPRODUCTIVE ORGANS: Anteverted uterus. Right ovarian cyst measuring 4.0 cm (2:129). Persistent hyperdense lesion associated with the right ovary measuring 5.0 x 4.5 cm, previously 4.0 x 3.2 cm.  There is no surrounding stranding or fluid. ? ?GI TRACT: Sequela of gastric  bypass surgery. Small hiatal hernia. Enteric contrast is visualized crossing the gastrojejunal anastomosis and jejunojejunal anastomosis without evidence of oral contrast extravasation. No findings of bowel obstruction or acute inflammation.  Normal appendix. ? ?PERITONEUM, RETROPERITONEUM AND MESENTERY: No free air.  No ascites.  No fluid collection. ? ?LYMPH NODES: No adenopathy. ? ?VESSELS: Hepatic and portal veins are patent.  Normal caliber aorta.   ? ?BONES and SOFT TISSUES: No aggressive osseous lesions.  No focal soft tissue lesions.  ? ?Consultants: None ?New medications: Carafate ?Discharge instructions:  Follow up here ?Status: better ? ?Had been taking diclofenac and had been having belly pain. She stopped it. She is still having a lot of pain and bloating in her belly. + nausea, no vomiting. No diarrhea. Continues with pain. No blood in her stool. Due to have a hysterectomy in about 2 weeks. Anxious that she will be sick for the surgery. No other concerns or complaints at this time.  ? ?Relevant past medical, surgical, family and social history reviewed and updated as indicated. Interim medical history since our last visit reviewed. ?Allergies and medications reviewed and updated. ? ?Review of Systems  ?Constitutional: Negative.   ?Respiratory: Negative.    ?Cardiovascular: Negative.   ?Gastrointestinal:  Positive for abdominal distention and abdominal pain. Negative for anal bleeding, blood in stool, constipation, diarrhea, nausea, rectal pain and vomiting.  ?Musculoskeletal:  Positive for arthralgias, back pain and myalgias. Negative for gait problem, joint swelling, neck pain and neck stiffness.  ?Skin: Negative.   ?Neurological: Negative.   ?Psychiatric/Behavioral: Negative.    ? ?Per HPI unless specifically indicated above ? ?   ?Objective:  ?  ?BP (!) 87/60   Pulse 65   Temp 97.7 ?F (36.5 ?C)   Wt (!) 310 lb (140.6 kg)   SpO2 97%   BMI 47.84 kg/m?   ?Wt Readings from Last 3 Encounters:   ?10/30/21 (!) 310 lb (140.6 kg)  ?09/24/21 (!) 306 lb 6.4 oz (139 kg)  ?08/31/21 (!) 311 lb (141.1 kg)  ?  ?Physical Exam ?Vitals and nursing note reviewed.  ?Constitutional:   ?   General: She is not in acute distress. ?   Appearance: Normal appearance. She is well-developed. She is obese. She is not ill-appearing, toxic-appearing or diaphoretic.  ?HENT:  ?   Head: Normocephalic and atraumatic.  ?   Right Ear: External ear normal.  ?   Left Ear: External ear normal.  ?   Nose: Nose normal.  ?   Mouth/Throat:  ?   Mouth: Mucous membranes are moist.  ?   Pharynx: Oropharynx is clear.  ?Eyes:  ?   General: No scleral icterus.    ?   Right eye: No discharge.     ?   Left eye: No discharge.  ?   Extraocular Movements: Extraocular movements intact.  ?   Conjunctiva/sclera: Conjunctivae normal.  ?   Pupils: Pupils are equal, round, and reactive to light.  ?Cardiovascular:  ?   Rate and Rhythm: Normal rate and regular rhythm.  ?   Pulses: Normal pulses.  ?   Heart sounds: Normal heart sounds. No murmur heard. ?  No friction rub. No gallop.  ?Pulmonary:  ?   Effort: Pulmonary effort is normal. No respiratory distress.  ?   Breath sounds: Normal breath sounds. No stridor. No wheezing, rhonchi or rales.  ?Chest:  ?   Chest wall: No  tenderness.  ?Abdominal:  ?   General: Abdomen is protuberant. Bowel sounds are normal.  ?   Palpations: Abdomen is soft. There is no shifting dullness, fluid wave, hepatomegaly, splenomegaly, mass or pulsatile mass.  ?   Tenderness: There is abdominal tenderness in the epigastric area.  ?Musculoskeletal:     ?   General: Normal range of motion.  ?   Cervical back: Normal range of motion and neck supple.  ?Skin: ?   General: Skin is warm and dry.  ?   Capillary Refill: Capillary refill takes less than 2 seconds.  ?   Coloration: Skin is not jaundiced or pale.  ?   Findings: No bruising, erythema, lesion or rash.  ?Neurological:  ?   General: No focal deficit present.  ?   Mental Status: She is  alert and oriented to person, place, and time. Mental status is at baseline.  ?Psychiatric:     ?   Mood and Affect: Mood normal.     ?   Behavior: Behavior normal.     ?   Thought Content: Thought content normal.     ?   Judgment:

## 2021-11-01 DIAGNOSIS — M5431 Sciatica, right side: Secondary | ICD-10-CM | POA: Diagnosis not present

## 2021-11-01 DIAGNOSIS — E1169 Type 2 diabetes mellitus with other specified complication: Secondary | ICD-10-CM | POA: Diagnosis not present

## 2021-11-16 ENCOUNTER — Encounter (HOSPITAL_BASED_OUTPATIENT_CLINIC_OR_DEPARTMENT_OTHER): Payer: Self-pay | Admitting: Obstetrics

## 2021-11-16 ENCOUNTER — Encounter (HOSPITAL_COMMUNITY)
Admission: RE | Admit: 2021-11-16 | Discharge: 2021-11-16 | Disposition: A | Discharge status: Home / Self Care | Payer: BC Managed Care – PPO | Source: Ambulatory Visit | Attending: Obstetrics | Admitting: Obstetrics

## 2021-11-16 ENCOUNTER — Other Ambulatory Visit: Payer: Self-pay

## 2021-11-16 DIAGNOSIS — Z01812 Encounter for preprocedural laboratory examination: Secondary | ICD-10-CM | POA: Insufficient documentation

## 2021-11-16 LAB — BASIC METABOLIC PANEL
Anion gap: 5 (ref 5–15)
BUN: 13 mg/dL (ref 6–20)
CO2: 29 mmol/L (ref 22–32)
Calcium: 8.7 mg/dL — ABNORMAL LOW (ref 8.9–10.3)
Chloride: 106 mmol/L (ref 98–111)
Creatinine, Ser: 0.83 mg/dL (ref 0.44–1.00)
GFR, Estimated: >60 mL/min (ref >60)
Glucose, Bld: 97 mg/dL (ref 70–99)
Potassium: 4.3 mmol/L (ref 3.5–5.1)
Sodium: 140 mmol/L (ref 135–145)

## 2021-11-16 LAB — CBC
HCT: 39.3 % (ref 36.0–46.0)
Hemoglobin: 12.5 g/dL (ref 12.0–15.0)
MCH: 27 pg (ref 26.0–34.0)
MCHC: 31.8 g/dL (ref 30.0–36.0)
MCV: 84.9 fL (ref 80.0–100.0)
Platelets: 308 10*3/uL (ref 150–400)
RBC: 4.63 MIL/uL (ref 3.87–5.11)
RDW: 13.1 % (ref 11.5–15.5)
WBC: 6.5 10*3/uL (ref 4.0–10.5)
nRBC: 0 % (ref 0.0–0.2)

## 2021-11-16 NOTE — Progress Notes (Signed)
Spoke w/ via phone for pre-op interview--- pt Lab needs dos----  urine preg             Lab results------ pt had lab work done 11-16-2021, CBC/ BMP/ T&S results in epic COVID test -----patient states asymptomatic no test needed Arrive at ------- 1130 on 11-20-2021 NPO after MN NO Solid Food.  Clear liquids from MN until--- 1030 Med rec completed Medications to take morning of surgery ----- celexa, protonix, allergra, orilissa Diabetic medication ----- n/a Patient instructed no nail polish to be worn day of surgery Patient instructed to bring photo id and insurance card day of surgery Patient aware to have Driver (ride ) / caregiver for 24 hours after surgery --- husband, Kimberly Santiago Patient Special Instructions ----- n/a Pre-Op special Istructions ----- n/a Patient verbalized understanding of instructions that were given at this phone interview. Patient denies shortness of breath, chest pain, fever, cough at this phone interview.

## 2021-11-20 ENCOUNTER — Ambulatory Visit (HOSPITAL_BASED_OUTPATIENT_CLINIC_OR_DEPARTMENT_OTHER): Payer: BC Managed Care – PPO | Admitting: Anesthesiology

## 2021-11-20 ENCOUNTER — Other Ambulatory Visit: Payer: Self-pay

## 2021-11-20 ENCOUNTER — Ambulatory Visit (HOSPITAL_BASED_OUTPATIENT_CLINIC_OR_DEPARTMENT_OTHER)
Admission: RE | Admit: 2021-11-20 | Discharge: 2021-11-20 | Disposition: A | Discharge status: Home / Self Care | Payer: BC Managed Care – PPO | Attending: Obstetrics | Admitting: Obstetrics

## 2021-11-20 ENCOUNTER — Encounter (HOSPITAL_BASED_OUTPATIENT_CLINIC_OR_DEPARTMENT_OTHER): Payer: Self-pay | Admitting: Obstetrics

## 2021-11-20 ENCOUNTER — Encounter (HOSPITAL_BASED_OUTPATIENT_CLINIC_OR_DEPARTMENT_OTHER)
Admission: RE | Disposition: A | Discharge status: Home / Self Care | Payer: Self-pay | Source: Home / Self Care | Attending: Obstetrics

## 2021-11-20 DIAGNOSIS — N83291 Other ovarian cyst, right side: Secondary | ICD-10-CM | POA: Diagnosis not present

## 2021-11-20 DIAGNOSIS — Z9851 Tubal ligation status: Secondary | ICD-10-CM | POA: Diagnosis not present

## 2021-11-20 DIAGNOSIS — Z6841 Body Mass Index (BMI) 40.0 and over, adult: Secondary | ICD-10-CM | POA: Diagnosis not present

## 2021-11-20 DIAGNOSIS — N80121 Deep endometriosis of right ovary: Secondary | ICD-10-CM | POA: Insufficient documentation

## 2021-11-20 DIAGNOSIS — N809 Endometriosis, unspecified: Secondary | ICD-10-CM | POA: Diagnosis not present

## 2021-11-20 DIAGNOSIS — N83201 Unspecified ovarian cyst, right side: Secondary | ICD-10-CM

## 2021-11-20 DIAGNOSIS — Z9884 Bariatric surgery status: Secondary | ICD-10-CM | POA: Diagnosis not present

## 2021-11-20 DIAGNOSIS — F418 Other specified anxiety disorders: Secondary | ICD-10-CM | POA: Diagnosis not present

## 2021-11-20 DIAGNOSIS — Z01818 Encounter for other preprocedural examination: Secondary | ICD-10-CM

## 2021-11-20 HISTORY — DX: Unspecified ovarian cyst, right side: N83.201

## 2021-11-20 HISTORY — DX: Unspecified chronic gastritis without bleeding: K29.50

## 2021-11-20 HISTORY — DX: Personal history of other mental and behavioral disorders: Z86.59

## 2021-11-20 HISTORY — DX: Anxiety disorder, unspecified: F41.9

## 2021-11-20 HISTORY — DX: Personal history of other diseases of the digestive system: Z87.19

## 2021-11-20 HISTORY — DX: Endometriosis, unspecified: N80.9

## 2021-11-20 HISTORY — DX: Overactive bladder: N32.81

## 2021-11-20 HISTORY — DX: Diaphragmatic hernia without obstruction or gangrene: K44.9

## 2021-11-20 HISTORY — DX: Migraine, unspecified, not intractable, without status migrainosus: G43.909

## 2021-11-20 HISTORY — DX: Personal history of other diseases of the nervous system and sense organs: Z86.69

## 2021-11-20 HISTORY — DX: Nontoxic single thyroid nodule: E04.1

## 2021-11-20 HISTORY — PX: LAPAROSCOPIC SALPINGO OOPHERECTOMY: SHX5927

## 2021-11-20 HISTORY — DX: Stress incontinence (female) (male): N39.3

## 2021-11-20 LAB — TYPE AND SCREEN
ABO/RH(D): AB POS
Antibody Screen: NEGATIVE

## 2021-11-20 LAB — POCT PREGNANCY, URINE: Preg Test, Ur: NEGATIVE

## 2021-11-20 SURGERY — SALPINGO-OOPHORECTOMY, LAPAROSCOPIC
Anesthesia: General | Site: Abdomen | Laterality: Right

## 2021-11-20 MED ORDER — ROCURONIUM BROMIDE 10 MG/ML (PF) SYRINGE
PREFILLED_SYRINGE | INTRAVENOUS | Status: AC
  Filled 2021-11-20: qty 10

## 2021-11-20 MED ORDER — MIDAZOLAM HCL 5 MG/5ML IJ SOLN
INTRAMUSCULAR | Status: DC | PRN
  Administered 2021-11-20: 2 mg via INTRAVENOUS

## 2021-11-20 MED ORDER — DEXAMETHASONE SODIUM PHOSPHATE 10 MG/ML IJ SOLN
INTRAMUSCULAR | Status: AC
  Filled 2021-11-20: qty 1

## 2021-11-20 MED ORDER — PROPOFOL 1000 MG/100ML IV EMUL
INTRAVENOUS | Status: AC
  Filled 2021-11-20: qty 100

## 2021-11-20 MED ORDER — ONDANSETRON HCL 4 MG/2ML IJ SOLN
INTRAMUSCULAR | Status: DC | PRN
  Administered 2021-11-20: 4 mg via INTRAVENOUS

## 2021-11-20 MED ORDER — KETOROLAC TROMETHAMINE 30 MG/ML IJ SOLN
INTRAMUSCULAR | Status: DC | PRN
  Administered 2021-11-20: 30 mg via INTRAVENOUS

## 2021-11-20 MED ORDER — ATROPINE SULFATE 0.4 MG/ML IV SOLN
INTRAVENOUS | Status: DC | PRN
  Administered 2021-11-20: .4 mg via INTRAVENOUS

## 2021-11-20 MED ORDER — PHENYLEPHRINE 80 MCG/ML (10ML) SYRINGE FOR IV PUSH (FOR BLOOD PRESSURE SUPPORT)
PREFILLED_SYRINGE | INTRAVENOUS | Status: DC | PRN
  Administered 2021-11-20: 160 ug via INTRAVENOUS

## 2021-11-20 MED ORDER — OXYCODONE-ACETAMINOPHEN 5-325 MG PO TABS: 2.0000 | ORAL_TABLET | Freq: Four times a day (QID) | ORAL | 0 refills | Status: DC | PRN

## 2021-11-20 MED ORDER — SODIUM CHLORIDE 0.9 % IV SOLN
INTRAVENOUS | Status: DC | PRN
  Administered 2021-11-20: 60 mL

## 2021-11-20 MED ORDER — PROPOFOL 10 MG/ML IV BOLUS
INTRAVENOUS | Status: AC
Start: 2021-11-20 — End: ?
  Filled 2021-11-20: qty 20

## 2021-11-20 MED ORDER — PROPOFOL 500 MG/50ML IV EMUL
INTRAVENOUS | Status: DC | PRN
  Administered 2021-11-20: 100 ug/kg/min via INTRAVENOUS

## 2021-11-20 MED ORDER — AMISULPRIDE (ANTIEMETIC) 5 MG/2ML IV SOLN: 10.0000 mg | Freq: Once | INTRAVENOUS | Status: DC | PRN

## 2021-11-20 MED ORDER — MIDAZOLAM HCL 2 MG/2ML IJ SOLN
INTRAMUSCULAR | Status: AC
  Filled 2021-11-20: qty 2

## 2021-11-20 MED ORDER — SUGAMMADEX SODIUM 200 MG/2ML IV SOLN
INTRAVENOUS | Status: DC | PRN
  Administered 2021-11-20: 280 mg via INTRAVENOUS

## 2021-11-20 MED ORDER — POVIDONE-IODINE 10 % EX SWAB: 2.0000 "application " | Freq: Once | CUTANEOUS | Status: DC

## 2021-11-20 MED ORDER — ROCURONIUM BROMIDE 10 MG/ML (PF) SYRINGE
PREFILLED_SYRINGE | INTRAVENOUS | Status: DC | PRN
  Administered 2021-11-20: 10 mg via INTRAVENOUS
  Administered 2021-11-20: 100 mg via INTRAVENOUS

## 2021-11-20 MED ORDER — ACETAMINOPHEN 500 MG PO TABS
1000.0000 mg | ORAL_TABLET | Freq: Once | ORAL | Status: AC
  Administered 2021-11-20: 1000 mg via ORAL

## 2021-11-20 MED ORDER — GLYCOPYRROLATE 0.2 MG/ML IJ SOLN
INTRAMUSCULAR | Status: DC | PRN
Start: 2021-11-20 — End: 2021-11-20
  Administered 2021-11-20: .1 mg via INTRAVENOUS

## 2021-11-20 MED ORDER — OXYCODONE HCL 5 MG PO TABS
5.0000 mg | ORAL_TABLET | Freq: Once | ORAL | Status: AC | PRN
  Administered 2021-11-20: 5 mg via ORAL

## 2021-11-20 MED ORDER — OXYCODONE HCL 5 MG/5ML PO SOLN: 5.0000 mg | Freq: Once | ORAL | Status: AC | PRN

## 2021-11-20 MED ORDER — SCOPOLAMINE 1 MG/3DAYS TD PT72
MEDICATED_PATCH | TRANSDERMAL | Status: AC
  Filled 2021-11-20: qty 1

## 2021-11-20 MED ORDER — PROPOFOL 10 MG/ML IV BOLUS
INTRAVENOUS | Status: DC | PRN
  Administered 2021-11-20: 200 mg via INTRAVENOUS

## 2021-11-20 MED ORDER — DEXAMETHASONE SODIUM PHOSPHATE 10 MG/ML IJ SOLN
INTRAMUSCULAR | Status: DC | PRN
  Administered 2021-11-20: 10 mg via INTRAVENOUS

## 2021-11-20 MED ORDER — LIDOCAINE 2% (20 MG/ML) 5 ML SYRINGE
INTRAMUSCULAR | Status: DC | PRN
  Administered 2021-11-20: 100 mg via INTRAVENOUS

## 2021-11-20 MED ORDER — EPHEDRINE SULFATE (PRESSORS) 50 MG/ML IJ SOLN
INTRAMUSCULAR | Status: DC | PRN
  Administered 2021-11-20: 5 mg via INTRAVENOUS

## 2021-11-20 MED ORDER — AMISULPRIDE (ANTIEMETIC) 5 MG/2ML IV SOLN
INTRAVENOUS | Status: AC
  Filled 2021-11-20: qty 4

## 2021-11-20 MED ORDER — ONDANSETRON HCL 4 MG/2ML IJ SOLN
4.0000 mg | Freq: Once | INTRAMUSCULAR | Status: DC | PRN
Start: 2021-11-20 — End: 2021-11-21

## 2021-11-20 MED ORDER — OXYCODONE HCL 5 MG PO TABS
ORAL_TABLET | ORAL | Status: AC
  Filled 2021-11-20: qty 1

## 2021-11-20 MED ORDER — FENTANYL CITRATE (PF) 100 MCG/2ML IJ SOLN
INTRAMUSCULAR | Status: AC
Start: 2021-11-20 — End: ?
  Filled 2021-11-20: qty 2

## 2021-11-20 MED ORDER — ACETAMINOPHEN 500 MG PO TABS
ORAL_TABLET | ORAL | Status: AC
  Filled 2021-11-20: qty 2

## 2021-11-20 MED ORDER — LACTATED RINGERS IV SOLN: INTRAVENOUS | Status: DC

## 2021-11-20 MED ORDER — FENTANYL CITRATE (PF) 100 MCG/2ML IJ SOLN
INTRAMUSCULAR | Status: AC
  Filled 2021-11-20: qty 2

## 2021-11-20 MED ORDER — FENTANYL CITRATE (PF) 100 MCG/2ML IJ SOLN
25.0000 ug | INTRAMUSCULAR | Status: DC | PRN
  Administered 2021-11-20: 50 ug via INTRAVENOUS

## 2021-11-20 MED ORDER — FENTANYL CITRATE (PF) 100 MCG/2ML IJ SOLN
INTRAMUSCULAR | Status: DC | PRN
  Administered 2021-11-20 (×2): 50 ug via INTRAVENOUS
  Administered 2021-11-20: 100 ug via INTRAVENOUS

## 2021-11-20 MED ORDER — BUPIVACAINE HCL (PF) 0.25 % IJ SOLN
INTRAMUSCULAR | Status: DC | PRN
  Administered 2021-11-20: 13 mL

## 2021-11-20 MED ORDER — ONDANSETRON HCL 4 MG/2ML IJ SOLN
INTRAMUSCULAR | Status: AC
  Filled 2021-11-20: qty 2

## 2021-11-20 MED ORDER — 0.9 % SODIUM CHLORIDE (POUR BTL) OPTIME
TOPICAL | Status: DC | PRN
  Administered 2021-11-20: 500 mL

## 2021-11-20 MED ORDER — SCOPOLAMINE 1 MG/3DAYS TD PT72
1.0000 | MEDICATED_PATCH | TRANSDERMAL | Status: DC
  Administered 2021-11-20: 1.5 mg via TRANSDERMAL

## 2021-11-20 SURGICAL SUPPLY — 49 items
ADH SKN CLS APL DERMABOND .7 (GAUZE/BANDAGES/DRESSINGS) ×3
BAG DECANTER FOR FLEXI CONT (MISCELLANEOUS) ×3 IMPLANT
BAG SPEC RTRVL LRG 6X4 10 (ENDOMECHANICALS) ×3
CNTNR URN SCR LID CUP LEK RST (MISCELLANEOUS) ×3 IMPLANT
CONT SPEC 4OZ STRL OR WHT (MISCELLANEOUS) ×4
COVER MAYO STAND STRL (DRAPES) ×4 IMPLANT
DECANTER SPIKE VIAL GLASS SM (MISCELLANEOUS) ×8 IMPLANT
DERMABOND ADVANCED (GAUZE/BANDAGES/DRESSINGS) ×1
DERMABOND ADVANCED .7 DNX12 (GAUZE/BANDAGES/DRESSINGS) ×3 IMPLANT
DRAPE UTILITY XL STRL (DRAPES) ×4 IMPLANT
DRSG OPSITE POSTOP 3X4 (GAUZE/BANDAGES/DRESSINGS) ×4 IMPLANT
DURAPREP 26ML APPLICATOR (WOUND CARE) ×7 IMPLANT
ELECT REM PT RETURN 9FT ADLT (ELECTROSURGICAL) ×4
ELECTRODE REM PT RTRN 9FT ADLT (ELECTROSURGICAL) ×3 IMPLANT
GAUZE 4X4 16PLY ~~LOC~~+RFID DBL (SPONGE) ×8 IMPLANT
GLOVE BIO SURGEON STRL SZ 6 (GLOVE) ×9 IMPLANT
GLOVE BIO SURGEON STRL SZ 6.5 (GLOVE) ×12 IMPLANT
GLOVE BIOGEL PI IND STRL 6 (GLOVE) ×6 IMPLANT
GLOVE BIOGEL PI IND STRL 7.0 (GLOVE) ×15 IMPLANT
GLOVE BIOGEL PI IND STRL 7.5 (GLOVE) ×2 IMPLANT
GLOVE BIOGEL PI INDICATOR 6 (GLOVE) ×3
GLOVE BIOGEL PI INDICATOR 7.0 (GLOVE) ×5
GLOVE BIOGEL PI INDICATOR 7.5 (GLOVE) ×1
GLOVE ECLIPSE 7.5 STRL STRAW (GLOVE) ×3 IMPLANT
GOWN STRL REUS W/TWL LRG LVL3 (GOWN DISPOSABLE) ×17 IMPLANT
KIT TURNOVER CYSTO (KITS) ×4 IMPLANT
LEGGING LITHOTOMY PAIR STRL (DRAPES) ×4 IMPLANT
LIGASURE VESSEL 5MM BLUNT TIP (ELECTROSURGICAL) ×4 IMPLANT
MANIPULATOR UTERINE 4.5 ZUMI (MISCELLANEOUS) ×4 IMPLANT
PACK ROBOT WH (CUSTOM PROCEDURE TRAY) ×4 IMPLANT
PACK TRENDGUARD 450 HYBRID PRO (MISCELLANEOUS) ×2 IMPLANT
PAD OB MATERNITY 4.3X12.25 (PERSONAL CARE ITEMS) ×4 IMPLANT
PAD PREP 24X48 CUFFED NSTRL (MISCELLANEOUS) ×4 IMPLANT
POUCH SPECIMEN RETRIEVAL 10MM (ENDOMECHANICALS) ×3 IMPLANT
PROTECTOR NERVE ULNAR (MISCELLANEOUS) ×8 IMPLANT
SET TRI-LUMEN FLTR TB AIRSEAL (TUBING) ×4 IMPLANT
SOL ANTI FOG 6CC (MISCELLANEOUS) ×2 IMPLANT
SOLUTION ANTI FOG 6CC (MISCELLANEOUS) ×1
SUT PLAIN 2 0 (SUTURE) ×4
SUT PLAIN ABS 2-0 CT1 27XMFL (SUTURE) ×2 IMPLANT
SUT VIC AB 4-0 PS2 18 (SUTURE) ×7 IMPLANT
SUT VICRYL 0 UR6 27IN ABS (SUTURE) ×4 IMPLANT
TOWEL OR 17X26 10 PK STRL BLUE (TOWEL DISPOSABLE) ×4 IMPLANT
TRAY FOLEY W/BAG SLVR 14FR LF (SET/KITS/TRAYS/PACK) ×4 IMPLANT
TRENDGUARD 450 HYBRID PRO PACK (MISCELLANEOUS) ×4
TROCAR BALLN 12MMX100 BLUNT (TROCAR) ×4 IMPLANT
TROCAR BLADELESS OPT 5 100 (ENDOMECHANICALS) ×8 IMPLANT
TROCAR PORT AIRSEAL 5X120 (TROCAR) ×1 IMPLANT
WARMER LAPAROSCOPE (MISCELLANEOUS) ×4 IMPLANT

## 2021-11-20 NOTE — Op Note (Signed)
11/20/2021  3:32 PM  PATIENT:  Kimberly Santiago  46 y.o. female  PRE-OPERATIVE DIAGNOSIS:  Complex Right Ovarian Cyst, Endometriosis  POST-OPERATIVE DIAGNOSIS:  Complex Right Ovarian Cyst, Endometriosis  PROCEDURE:  Procedure(s): LAPAROSCOPIC SALPINGO OOPHORECTOMY (Right)  SURGEON:  Surgeon(s) and Role:    Noland Fordyce, MD - Primary    * Almquist, Candace Gallus, MD - Assisting  PHYSICIAN ASSISTANT: None  ASSISTANTS: As above  ANESTHESIA:   local and general  EBL:  25 mL   BLOOD ADMINISTERED:none  DRAINS: Urinary Catheter (Foley)   LOCAL MEDICATIONS USED:  MARCAINE     SPECIMEN: Right fallopian tube and ovary  DISPOSITION OF SPECIMEN:  PATHOLOGY  COUNTS:  YES  Findings: Normal liver edge, lack of adhesive disease in the upper and lower abdomen, no endometrial implants or evidence of endometriosis, normal appendix, normal left tube and ovary, normal uterus, ovarian fossa bilaterally, normal anterior and posterior cul-de-sac without evidence of adhesions or endometriosis, right ovary enlarged about 8 cm with multiple cystic areas.  Easily identified of the right ureter.  TOURNIQUET:  * No tourniquets in log *  DICTATION: .Note written in EPIC  PLAN OF CARE: Discharge to home after PACU  PATIENT DISPOSITION:  PACU - hemodynamically stable.   Delay start of Pharmacological VTE agent (>24hrs) due to surgical blood loss or risk of bleeding: yes   Indications: This is a 46 year old G2, P2 nonpregnant patient with history of endometriosis and persistent right-sided pelvic pain suspected due to right complex ovarian cyst.  Patient failed conservative management and requested surgical management.  Patient is here for a laparoscopic right salpingo-oophorectomy  Procedure: After informed consent including discussion of risks of bleeding, infection, failure to achieve desired outcome and discussion of alternatives, the patient was taken to the operating room where general  anesthesia was initiated without difficulty. She was prepped and draped in normal sterile fashion in the dorsal supine lithotomy position. A Foley catheter was inserted sterilely into the bladder. A bimanual examination was done to assess the size and position of the uterus. The speculum was inserted into the vagina. A single-tooth tenaculum was used to grasp the anterior lip of the cervix. The cervix was dilated to a #21 Pratt  dilator. A ZUMI uterine manipulator was placed into the uterus but the balloon tip popped and the manipulator fell out.  We then used a Hulka tenaculum for uterine manipulation.    Attention was then turned to the patient's abdomen. 0.5 % marcaine, 30 cc was mixed with 30 cc of saline and this was the local mixture that was used.  A total of 20 cc of the solution was used in the skin.  A 20 incision was made in the umbilicus and blunt and sharp dissection was done until the fascia was identified.  Multiple retractors were needed due to the depth of the patient's fascia and the thickness of the subcutaneous tissue.  This fascia was then grasped with Kocher clamps x2 and entered sharply. . Intraperitoneal placement was confirmed with the use of the camera and pneumoperitoneum was created to 15 mm of mercury. Brief survey of the abdomen and pelvis was done with findings as above. The abdominal wall was assessed and additional port sites were marked.  5 mm incisions were placed in the right and left lower quadrants after first infusing with 5 cc of Marcaine solution each.  Ports were placed under direct visualization.    The right ureter could be seen peristalsing and this was  kept in view throughout the case.  The right infundibulopelvic ligament was identified, serial cautery then transection was done with the LigaSure device.  We carried through the mesosalpinx to the proximal mesosalpinx and the fallopian tube was transected at the cornual region.  The right utero-ovarian ligament was  serially cauterized and divided.  The right tube and ovary were then free.  Hemostasis of the pedicles were assured.  Placing a 5 mm hysteroscope through the left lower quadrant port and a 10 mm Endo Catch bag through the umbilicus the specimen was placed in the Endo Catch bag and pulled out through the umbilicus.  The specimen was larger than the fascial incision and the Endo Catch bag was opened in the subcutaneous tissue.  The cystic areas were opened and drained of a thick light brown serous fluid.  Once 3 cysts were significantly decompressed the specimen in the Endo Catch bag was able to come through the fascial incision.  The specimen was sent to pathology  Reinstituting pneumoperitoneum through the left lower quadrant port the pelvis was reevaluated.  Hemostasis was assured.  The right ureter was then again seen with good peristaltic motion.  20 cc of the Marcaine mixture was then placed over the pedicles.  The right lower quadrant trocar was removed under direct visualization as was the left lower quadrant trocar after pneumoperitoneum was released.  Using retractors at the umbilicus the fascia was identified and grasped with Kocher clamps x2 a running suture of 0 Vicryl on a UR 6 was used to close the fascia.  A 2-0 Vicryl was used to close the subcutaneous space and a 4-0 Vicryl was used to close the skin.  A single figure-of-eight of 4-0 Vicryl was used in the left lower quadrant port and the ports were covered with Dermabond by the nurse.  I went to reevaluate the vaginal area.  The Hulka tenaculum was removed hemostasis was noted.  The case was then terminated.  Sponge lap and needle counts were correct x3 and patient was taken recovery room in a stable condition.  Lendon Colonel 11/20/2021 3:43 PM

## 2021-11-20 NOTE — Brief Op Note (Signed)
11/20/2021  3:32 PM  PATIENT:  Kimberly Santiago  46 y.o. female  PRE-OPERATIVE DIAGNOSIS:  Complex Right Ovarian Cyst, Endometriosis  POST-OPERATIVE DIAGNOSIS:  Complex Right Ovarian Cyst, Endometriosis  PROCEDURE:  Procedure(s): LAPAROSCOPIC SALPINGO OOPHORECTOMY (Right)  SURGEON:  Surgeon(s) and Role:    * Aloha Gell, MD - Primary    * Almquist, Earlyne Iba, MD - Assisting  PHYSICIAN ASSISTANT: None  ASSISTANTS: As above  ANESTHESIA:   local and general  EBL:  25 mL   BLOOD ADMINISTERED:none  DRAINS: Urinary Catheter (Foley)   LOCAL MEDICATIONS USED:  MARCAINE     SPECIMEN: Right fallopian tube and ovary  DISPOSITION OF SPECIMEN:  PATHOLOGY  COUNTS:  YES  TOURNIQUET:  * No tourniquets in log *  DICTATION: .Note written in EPIC  PLAN OF CARE: Discharge to home after PACU  PATIENT DISPOSITION:  PACU - hemodynamically stable.   Delay start of Pharmacological VTE agent (>24hrs) due to surgical blood loss or risk of bleeding: yes

## 2021-11-20 NOTE — Discharge Instructions (Signed)
NO IBUPROFEN PRODUCTS UNTIL 9:30 PM TONIGHT.    Post Anesthesia Home Care Instructions  Activity: Get plenty of rest for the remainder of the day. A responsible individual must stay with you for 24 hours following the procedure.  For the next 24 hours, DO NOT: -Drive a car -Advertising copywriter -Drink alcoholic beverages -Take any medication unless instructed by your physician -Make any legal decisions or sign important papers.  Meals: Start with liquid foods such as gelatin or soup. Progress to regular foods as tolerated. Avoid greasy, spicy, heavy foods. If nausea and/or vomiting occur, drink only clear liquids until the nausea and/or vomiting subsides. Call your physician if vomiting continues.  Special Instructions/Symptoms: Your throat may feel dry or sore from the anesthesia or the breathing tube placed in your throat during surgery. If this causes discomfort, gargle with warm salt water. The discomfort should disappear within 24 hours.  If you had a scopolamine patch placed behind your ear for the management of post- operative nausea and/or vomiting:  1. The medication in the patch is effective for 72 hours, after which it should be removed.  Wrap patch in a tissue and discard in the trash. Wash hands thoroughly with soap and water. 2. You may remove the patch earlier than 72 hours if you experience unpleasant side effects which may include dry mouth, dizziness or visual disturbances. 3. Avoid touching the patch. Wash your hands with soap and water after contact with the patch.

## 2021-11-20 NOTE — Transfer of Care (Signed)
Immediate Anesthesia Transfer of Care Note  Patient: Kimberly Santiago  Procedure(s) Performed: LAPAROSCOPIC SALPINGO OOPHORECTOMY (Right: Abdomen)  Patient Location: PACU  Anesthesia Type:General  Level of Consciousness: drowsy  Airway & Oxygen Therapy: Patient Spontanous Breathing and Patient connected to nasal cannula oxygen  Post-op Assessment: Report given to RN  Post vital signs: Reviewed and stable  Last Vitals:  Vitals Value Taken Time  BP 100/82   Temp    Pulse 103 11/20/21 1550  Resp 22 11/20/21 1550  SpO2 91 % 11/20/21 1550  Vitals shown include unvalidated device data.  Last Pain:  Vitals:   11/20/21 1135  TempSrc: Oral  PainSc: 5       Patients Stated Pain Goal: 5 (11/20/21 1135)  Complications: No notable events documented.

## 2021-11-20 NOTE — H&P (Signed)
Chief complaint: Complex right cyst pain  History of present illness: 46 year old morbidly obese G2, P2 with h/o endometriosis presents with complex right ovarian cyst persistent over 6+ months.  Patient has tried COC's and Dewayne Hatch which have not caused any reduction in cyst size or cyst pain.  Patient presents now for laparoscopic right salpingo-oophorectomy.  Patient's case is complicated by morbid obesity and multiple prior surgeries.  Patient has had history of gastric bypass in 2006, prior C-section x2, second C-section with tubal ligation, operative laparoscopy and laparoscopic cholecystectomy.  Past Medical History:  Diagnosis Date   Anemia    Anxiety    Depression    Endometriosis    Gastritis, chronic    GERD (gastroesophageal reflux disease)    Hiatal hernia    SMALL   History of Barrett's esophagus    per pt last EGD 07-07-2020 no evidence noted   History of obstructive sleep apnea    per pt prior to gastric bypass 2016, stated retested after bypass and tested negative   History of panic attacks    History of thrombophlebitis 2005   per pt LLE superficial   Migraines    OAB (overactive bladder)    Ovarian cyst, right    PCOS (polycystic ovarian syndrome)    PONV (postoperative nausea and vomiting)    S/P gastric bypass 09/08/2014   SUI (stress urinary incontinence, female)    Thyroid nodule    hx bx 02-09-2016  , benign,  in care everywhere      Past Surgical History:  Procedure Laterality Date   CESAREAN SECTION N/A 08/18/2017   Procedure: Primary CESAREAN SECTION;  Surgeon: Noland Fordyce, MD;  Location: [facility];  Service: Obstetrics;  Laterality: N/A;  EDD: 09/13/17 Allergy: Amoxicillin, Morphine, Hydrocodone   CESAREAN SECTION WITH BILATERAL TUBAL LIGATION Bilateral 07/27/2019   Procedure: Repeat CESAREAN SECTION WITH BILATERAL TUBAL LIGATION;  Surgeon: Noland Fordyce, MD;  Location: [facility];  Service: Obstetrics;  Laterality: Bilateral;  EDD:  07/31/19   COLONOSCOPY WITH PROPOFOL N/A 07/07/2020   Procedure: COLONOSCOPY WITH PROPOFOL;  Surgeon: Midge Minium, MD;  Location: [facility];  Service: Endoscopy;  Laterality: N/A;   DIAGNOSTIC LAPAROSCOPY  2006   for endometriosis   EAR CYST EXCISION N/A 11/24/2014   Procedure: CYST REMOVAL;  Surgeon: Bud Face, MD;  Location: [facility] ORS;  Service: ENT;  Laterality: N/A;  upper lip   ESOPHAGOGASTRODUODENOSCOPY (EGD) WITH PROPOFOL N/A 07/07/2020   Procedure: ESOPHAGOGASTRODUODENOSCOPY (EGD) WITH PROPOFOL;  Surgeon: Midge Minium, MD;  Location: [facility];  Service: Endoscopy;  Laterality: N/A;  sleep apnea   ETHMOIDECTOMY Right 05/07/2018   Procedure: ETHMOIDECTOMY;  Surgeon: Bud Face, MD;  Location: [facility] ORS;  Service: ENT;  Laterality: Right;   FRONTAL SINUS EXPLORATION Right 05/07/2018   Procedure: FRONTAL SINUS EXPLORATION;  Surgeon: Bud Face, MD;  Location: [facility] OR[facility];  Service: ENT;  Laterality: Right;   IMAGE GUIDED SINUS SURGERY N/A 05/07/2018   Procedure: IMAGE GUIDED SINUS SURGERY;  Surgeon: Bud Face, MD;  Location: [facility] OR[facility];  Service: ENT;  Laterality: N/A;   KNEE ARTHROSCOPY Right 2006   LAPAROSCOPIC CHOLECYSTECTOMY  2011   LAPAROSCOPIC GASTRIC BYPASS  08/30/2014   @[facility] ;   AND ROUX-EN-Y GASTROENTEROSTOMY   MAXILLARY ANTROSTOMY Bilateral 05/07/2018   Procedure: MAXILLARY ANTROSTOMY;  Surgeon: 13/12/2017, MD;  Location: [facility] ORS;  Service: ENT;  Laterality: Bilateral;   NASAL SEPTOPLASTY W/ TURBINOPLASTY Bilateral 05/07/2018   Procedure: NASAL SEPTOPLASTY WITH TURBINATE REDUCTION;  Surgeon: 13/12/2017,  Roney Mans, MD;  Location: [facility] ORS;  Service: ENT;  Laterality: Bilateral;   SPHENOIDECTOMY Right 05/07/2018   Procedure: SPHENOIDECTOMY;  Surgeon: Bud Face, MD;  Location: [facility] ORS;  Service: ENT;  Laterality: Right;   TEMPOROMANDIBULAR JOINT SURGERY Right 2003   WISDOM TOOTH EXTRACTION     Allergies: Penicillin causes  itchiness but has tolerated cephalosporins  Medications: Reviewed  Physical exam: Vitals:   11/20/21 1135  BP: 131/82  Pulse: 70  Resp: 18  Temp: 98 F (36.7 C)  SpO2: 96%   General: Anxious, obese, no distress Abdomen: Obese, nontender, multiple surgical incisions GU: Deferred to the OR Lower extremity: Nontender, no edema   UPT negative CBC    Component Value Date/Time   WBC 6.5 11/16/2021 0844   RBC 4.63 11/16/2021 0844   HGB 12.5 11/16/2021 0844   HGB 12.4 09/24/2021 0923   HCT 39.3 11/16/2021 0844   HCT 39.3 09/24/2021 0923   PLT 308 11/16/2021 0844   PLT 297 09/24/2021 0923   MCV 84.9 11/16/2021 0844   MCV 84 09/24/2021 0923   MCV 85 01/03/2014 0000   MCH 27.0 11/16/2021 0844   MCHC 31.8 11/16/2021 0844   RDW 13.1 11/16/2021 0844   RDW 13.5 09/24/2021 0923   RDW 13.6 01/03/2014 0000   LYMPHSABS 1.8 09/24/2021 0923   LYMPHSABS 3.4 01/03/2014 0000   MONOABS 0.8 06/10/2021 0452   MONOABS 1.0 (H) 01/03/2014 0000   EOSABS 0.2 09/24/2021 0923   EOSABS 0.5 01/03/2014 0000   BASOSABS 0.1 09/24/2021 0923   BASOSABS 0.1 01/03/2014 0000    Assessment and plan: 46 year old G2, P2 nonpregnant patient with history of endometriosis and persistent complex right ovarian cyst with persistent pelvic pain here for laparoscopic right salpingo-oophorectomy.  We will also plan lysis of adhesions and fulguration of any endometriosis that see.  Patient understands risks given her prior surgery and her obesity and declines expectant management.  Lendon Colonel 11/20/2021 1:34 PM

## 2021-11-20 NOTE — Anesthesia Procedure Notes (Signed)
Procedure Name: Intubation Date/Time: 11/20/2021 1:00 PM Performed by: Bonney Aid, CRNA Pre-anesthesia Checklist: Patient identified, Emergency Drugs available, Suction available and Patient being monitored Patient Re-evaluated:Patient Re-evaluated prior to induction Oxygen Delivery Method: Circle system utilized Preoxygenation: Pre-oxygenation with 100% oxygen Induction Type: IV induction Ventilation: Mask ventilation without difficulty Laryngoscope Size: Mac and 3 Grade View: Grade I Tube type: Oral Tube size: 7.5 mm Number of attempts: 1 Airway Equipment and Method: Stylet Placement Confirmation: ETT inserted through vocal cords under direct vision, positive ETCO2 and breath sounds checked- equal and bilateral Secured at: 21 cm Tube secured with: Tape Dental Injury: Teeth and Oropharynx as per pre-operative assessment

## 2021-11-20 NOTE — Anesthesia Preprocedure Evaluation (Signed)
Anesthesia Evaluation  Patient identified by MRN, date of birth, ID band Patient awake    Reviewed: Allergy & Precautions, NPO status , Patient's Chart, lab work & pertinent test results  History of Anesthesia Complications (+) PONV and history of anesthetic complications  Airway Mallampati: III  TM Distance: >3 FB Neck ROM: Full    Dental  (+) Dental Advisory Given   Pulmonary asthma ,    Pulmonary exam normal        Cardiovascular negative cardio ROS Normal cardiovascular exam     Neuro/Psych  Headaches, Anxiety Depression    GI/Hepatic Neg liver ROS, hiatal hernia, GERD  ,  Endo/Other  Morbid obesityPCOS  Renal/GU negative Renal ROS  negative genitourinary   Musculoskeletal negative musculoskeletal ROS (+)   Abdominal   Peds  Hematology negative hematology ROS (+)   Anesthesia Other Findings   Reproductive/Obstetrics Complex R ovarian cyst, endometriosis                           Anesthesia Physical Anesthesia Plan  ASA: 3  Anesthesia Plan: General   Post-op Pain Management: Tylenol PO (pre-op)* and Toradol IV (intra-op)*   Induction: Intravenous  PONV Risk Score and Plan: 4 or greater and Ondansetron, Dexamethasone, Treatment may vary due to age or medical condition, Midazolam and Scopolamine patch - Pre-op  Airway Management Planned: Oral ETT  Additional Equipment: None  Intra-op Plan:   Post-operative Plan: Extubation in OR  Informed Consent: I have reviewed the patients History and Physical, chart, labs and discussed the procedure including the risks, benefits and alternatives for the proposed anesthesia with the patient or authorized representative who has indicated his/her understanding and acceptance.     Dental advisory given  Plan Discussed with:   Anesthesia Plan Comments:        Anesthesia Quick Evaluation

## 2021-11-21 ENCOUNTER — Encounter (HOSPITAL_BASED_OUTPATIENT_CLINIC_OR_DEPARTMENT_OTHER): Payer: Self-pay | Admitting: Obstetrics

## 2021-11-21 NOTE — Anesthesia Postprocedure Evaluation (Signed)
Anesthesia Post Note  Patient: Kimberly Santiago  Procedure(s) Performed: LAPAROSCOPIC SALPINGO OOPHORECTOMY (Right: Abdomen)     Patient location during evaluation: PACU Anesthesia Type: General Level of consciousness: awake Pain management: pain level controlled Vital Signs Assessment: post-procedure vital signs reviewed and stable Respiratory status: spontaneous breathing Cardiovascular status: stable Postop Assessment: no apparent nausea or vomiting Anesthetic complications: no   No notable events documented.  Last Vitals:  Vitals:   11/20/21 1700 11/20/21 1800  BP: 124/61 134/70  Pulse: 95 95  Resp: 19 17  Temp:  36.6 C  SpO2: 96% 95%    Last Pain:  Vitals:   11/20/21 1800  TempSrc:   PainSc: 5                  John F Abbee Cremeens Jr

## 2021-11-22 LAB — SURGICAL PATHOLOGY

## 2021-12-04 DIAGNOSIS — Z09 Encounter for follow-up examination after completed treatment for conditions other than malignant neoplasm: Secondary | ICD-10-CM | POA: Diagnosis not present

## 2021-12-14 ENCOUNTER — Ambulatory Visit
Admission: EM | Admit: 2021-12-14 | Discharge: 2021-12-14 | Disposition: A | Discharge status: Home / Self Care | Payer: BC Managed Care – PPO | Attending: Emergency Medicine | Admitting: Emergency Medicine

## 2021-12-14 DIAGNOSIS — H1033 Unspecified acute conjunctivitis, bilateral: Secondary | ICD-10-CM

## 2021-12-14 DIAGNOSIS — J0100 Acute maxillary sinusitis, unspecified: Secondary | ICD-10-CM

## 2021-12-14 MED ORDER — IPRATROPIUM BROMIDE 0.06 % NA SOLN: 2.0000 | Freq: Four times a day (QID) | NASAL | 12 refills | Status: DC

## 2021-12-14 MED ORDER — PROMETHAZINE-DM 6.25-15 MG/5ML PO SYRP: 5.0000 mL | ORAL_SOLUTION | Freq: Four times a day (QID) | ORAL | 0 refills | Status: DC | PRN

## 2021-12-14 MED ORDER — BENZONATATE 100 MG PO CAPS: 200.0000 mg | ORAL_CAPSULE | Freq: Three times a day (TID) | ORAL | 0 refills | Status: DC

## 2021-12-14 MED ORDER — MOXIFLOXACIN HCL 0.5 % OP SOLN
1.0000 [drp] | Freq: Three times a day (TID) | OPHTHALMIC | 0 refills | Status: AC
Start: 2021-12-14 — End: 2021-12-21

## 2021-12-14 MED ORDER — CEFDINIR 300 MG PO CAPS: 300.0000 mg | ORAL_CAPSULE | Freq: Two times a day (BID) | ORAL | 1 refills | Status: DC

## 2021-12-14 NOTE — ED Triage Notes (Signed)
Pt reports cough with congestion for several days. She reports feeling SOB when coughing. Pt report bilateral pink eye.

## 2021-12-14 NOTE — ED Provider Notes (Signed)
[facility]    CSN: [id number] Arrival date & time: 12/14/21  0808      History   Chief Complaint Chief Complaint  Patient presents with   Sore Throat   Cough   Nasal Congestion   Conjunctivitis    HPI Kimberly Santiago is a 46 y.o. female.   HPI  46 year old female here for evaluation of respiratory and eye complaints.  Patient reports that for last 2 days she has been experiencing red itchy eyes and has been waking up with her eyes matted shut with a yellow mucopurulent drainage.  She also describes some burning with her eyes and some slight blurry vision due to the discharge.  Additionally, for the last 8 days she has been experiencing nasal congestion, sinus pressure, sore throat, cough, shortness of breath, and nausea.  She denies any fever, ear pain, vomiting, or diarrhea.  Past Medical History:  Diagnosis Date   Anemia    Anxiety    Depression    Endometriosis    Gastritis, chronic    GERD (gastroesophageal reflux disease)    Hiatal hernia    SMALL   History of Barrett's esophagus    per pt last EGD 07-07-2020 no evidence noted   History of obstructive sleep apnea    per pt prior to gastric bypass 2016, stated retested after bypass and tested negative   History of panic attacks    History of thrombophlebitis 2005   per pt LLE superficial   Migraines    OAB (overactive bladder)    Ovarian cyst, right    PCOS (polycystic ovarian syndrome)    PONV (postoperative nausea and vomiting)    S/P gastric bypass 09/08/2014   SUI (stress urinary incontinence, female)    Thyroid nodule    hx bx 02-09-2016  , benign,  in care everywhere    Patient Active Problem List   Diagnosis Date Noted   Post-operative nausea and vomiting 09/24/2021   Piriformis syndrome, left 08/31/2021   SI (sacroiliac) pain 08/31/2021   Greater trochanteric pain syndrome of left lower extremity 08/23/2021   Lumbar spondylosis 08/23/2021   Left leg pain 08/02/2021   Overactive  bladder 04/01/2021   Urge incontinence 04/01/2021   Stress incontinence, female 04/01/2021   Barrett's esophagus without dysplasia    Abnormal uterine bleeding (AUB) 03/17/2020   Polycystic ovarian syndrome 03/17/2020   OSA (obstructive sleep apnea) 03/09/2020   GERD (gastroesophageal reflux disease)    BMI 45.0-49.9, adult ([place]) 03/17/2018   Seasonal allergic rhinitis 01/12/2018   Anxiety 08/21/2017   Depression 08/21/2017   Postoperative malabsorption 09/19/2015   Status post bariatric surgery 09/27/2014   Mild intermittent asthma without complication 88/41/6606   Insulin resistance 12/20/2013   Thyroid nodule 12/20/2013   H/O thyroid nodule 07/29/2011    Past Surgical History:  Procedure Laterality Date   CESAREAN SECTION N/A 08/18/2017   Procedure: Primary CESAREAN SECTION;  Surgeon: Aloha Gell, MD;  Location: [place];  Service: Obstetrics;  Laterality: N/A;  EDD: 09/13/17 Allergy: Amoxicillin, Morphine, Hydrocodone   CESAREAN SECTION WITH BILATERAL TUBAL LIGATION Bilateral 07/27/2019   Procedure: Repeat CESAREAN SECTION WITH BILATERAL TUBAL LIGATION;  Surgeon: Aloha Gell, MD;  Location: [place] [facility];  Service: Obstetrics;  Laterality: Bilateral;  EDD: 07/31/19   COLONOSCOPY WITH PROPOFOL N/A 07/07/2020   Procedure: COLONOSCOPY WITH PROPOFOL;  Surgeon: Lucilla Lame, MD;  Location: [place];  Service: Endoscopy;  Laterality: N/A;   DIAGNOSTIC LAPAROSCOPY  2006   for  endometriosis   EAR CYST EXCISION N/A 11/24/2014   Procedure: CYST REMOVAL;  Surgeon: Carloyn Manner, MD;  Location: [facility];  Service: ENT;  Laterality: N/A;  upper lip   ESOPHAGOGASTRODUODENOSCOPY (EGD) WITH PROPOFOL N/A 07/07/2020   Procedure: ESOPHAGOGASTRODUODENOSCOPY (EGD) WITH PROPOFOL;  Surgeon: Lucilla Lame, MD;  Location: [place];  Service: Endoscopy;  Laterality: N/A;  sleep apnea   ETHMOIDECTOMY Right 05/07/2018   Procedure: ETHMOIDECTOMY;  Surgeon: Carloyn Manner, MD;  Location: [facility];  Service: ENT;  Laterality: Right;   FRONTAL SINUS EXPLORATION Right 05/07/2018   Procedure: FRONTAL SINUS EXPLORATION;  Surgeon: Carloyn Manner, MD;  Location: [facility];  Service: ENT;  Laterality: Right;   IMAGE GUIDED SINUS SURGERY N/A 05/07/2018   Procedure: IMAGE GUIDED SINUS SURGERY;  Surgeon: Carloyn Manner, MD;  Location: [facility];  Service: ENT;  Laterality: N/A;   KNEE ARTHROSCOPY Right 2006   LAPAROSCOPIC CHOLECYSTECTOMY  2011   LAPAROSCOPIC GASTRIC BYPASS  08/30/2014   _0 ;   AND ROUX-EN-Y GASTROENTEROSTOMY   LAPAROSCOPIC SALPINGO OOPHERECTOMY Right 11/20/2021   Procedure: LAPAROSCOPIC SALPINGO OOPHORECTOMY;  Surgeon: Aloha Gell, MD;  Location: [place];  Service: Gynecology;  Laterality: Right;   MAXILLARY ANTROSTOMY Bilateral 05/07/2018   Procedure: MAXILLARY ANTROSTOMY;  Surgeon: Carloyn Manner, MD;  Location: [facility];  Service: ENT;  Laterality: Bilateral;   NASAL SEPTOPLASTY W/ TURBINOPLASTY Bilateral 05/07/2018   Procedure: NASAL SEPTOPLASTY WITH TURBINATE REDUCTION;  Surgeon: Carloyn Manner, MD;  Location: [facility];  Service: ENT;  Laterality: Bilateral;   SPHENOIDECTOMY Right 05/07/2018   Procedure: Coralee Pesa;  Surgeon: Carloyn Manner, MD;  Location: [facility];  Service: ENT;  Laterality: Right;   TEMPOROMANDIBULAR JOINT SURGERY Right 2003   WISDOM TOOTH EXTRACTION      OB History     Gravida  2   Para  2   Term  1   Preterm  1   AB      Living  2      SAB      IAB      Ectopic      Multiple  0   Live Births  2            Home Medications    Prior to Admission medications   Medication Sig Start Date End Date Taking? Authorizing Provider  benzonatate (TESSALON) 100 MG capsule Take 2 capsules (200 mg total) by mouth every 8 (eight) hours. 12/14/21  Yes Margarette Canada, NP  cefdinir (OMNICEF) 300 MG capsule Take 1 capsule (300 mg total) by mouth 2 (two) times daily.  12/14/21  Yes Margarette Canada, NP  ipratropium (ATROVENT) 0.06 % nasal spray Place 2 sprays into both nostrils 4 (four) times daily. 12/14/21  Yes Margarette Canada, NP  moxifloxacin (VIGAMOX) 0.5 % ophthalmic solution Place 1 drop into both eyes 3 (three) times daily for 7 days. 12/14/21 12/21/21 Yes Margarette Canada, NP  promethazine-dextromethorphan (PROMETHAZINE-DM) 6.25-15 MG/5ML syrup Take 5 mLs by mouth 4 (four) times daily as needed. 12/14/21  Yes Margarette Canada, NP  acetaminophen (TYLENOL) 325 MG tablet Take 650 mg by mouth every 6 (six) hours as needed for mild pain or headache.    [provider]  busPIRone (BUSPAR) 7.5 MG tablet Take 7.5 mg by mouth at bedtime.    [provider]  citalopram (CELEXA) 40 MG tablet Take 1 tablet (40 mg total) by mouth daily. 09/24/21   Johnson, Megan P, DO  cyclobenzaprine (FLEXERIL) 10 MG tablet Take 1 tablet (  10 mg total) by mouth at bedtime. Patient taking differently: Take 10 mg by mouth at bedtime as needed. 05/29/21   Johnson, Megan P, DO  diclofenac (VOLTAREN) 50 MG EC tablet Take 1 tablet (50 mg total) by mouth 2 (two) times daily. 08/23/21   Montel Culver, MD  diclofenac Sodium (VOLTAREN) 1 % GEL Apply 4 g topically 4 (four) times daily. Patient taking differently: Apply 4 g topically 4 (four) times daily as needed. 05/29/21   Johnson, Megan P, DO  famotidine (PEPCID) 20 MG tablet Take 1 tablet (20 mg total) by mouth at bedtime. Patient taking differently: Take 20 mg by mouth at bedtime. 09/24/21   Johnson, Megan P, DO  fexofenadine (ALLEGRA) 180 MG tablet Take 180 mg by mouth daily.    [provider]  gabapentin (NEURONTIN) 100 MG capsule Take 1-3 capsules (100-300 mg total) by mouth at bedtime. Patient taking differently: Take 100-300 mg by mouth at bedtime as needed. 08/02/21   Park Liter P, DO  nitrofurantoin (MACRODANTIN) 100 MG capsule Take 1 capsule (100 mg total) by mouth daily. 06/18/21   Bjorn Loser, MD  nystatin  (MYCOSTATIN/NYSTOP) powder Apply 1 application topically 3 (three) times daily. Patient taking differently: Apply 1 application. topically 3 (three) times daily as needed. 09/18/20   Johnson, Megan P, DO  oxyCODONE-acetaminophen (PERCOCET) 5-325 MG tablet Take 2 tablets by mouth every 6 (six) hours as needed for severe pain. 11/20/21   Aloha Gell, MD  pantoprazole (PROTONIX) 40 MG tablet Take 1 tablet (40 mg total) by mouth 2 (two) times daily. Patient taking differently: Take 80 mg by mouth daily. 10/30/21   Johnson, Megan P, DO  sucralfate (CARAFATE) 1 GM/10ML suspension Take 1 g by mouth 4 (four) times daily as needed. 10/22/21   [provider]  traMADol (ULTRAM) 50 MG tablet Take 1 tablet (50 mg total) by mouth every 6 (six) hours as needed. 08/02/21   Johnson, Megan P, DO  fluticasone (FLONASE) 50 MCG/ACT nasal spray Place 2 sprays into both nostrils at bedtime.  05/15/20  [provider]    Family History Family History  Problem Relation Age of Onset   Breast cancer Paternal Aunt        pat great aunt   Heart disease Maternal Grandmother    Heart disease Maternal Grandfather    Heart disease Paternal Grandmother    Heart disease Paternal Grandfather    Anxiety disorder Mother    Depression Mother    Miscarriages / [place] Sister     Social History Social History   Tobacco Use   Smoking status: Never   Smokeless tobacco: Never  Vaping Use   Vaping Use: Never used  Substance Use Topics   Alcohol use: Not Currently   Drug use: Never     Allergies   Hydromorphone, Hydrocodone, Morphine and related, Nsaids, and Amoxicillin   Review of Systems Review of Systems  Constitutional:  Negative for fever.  HENT:  Positive for congestion, rhinorrhea, sinus pressure and sore throat. Negative for ear pain.   Eyes:  Positive for pain, discharge, redness, itching and visual disturbance. Negative for photophobia.  Respiratory:  Positive for cough and shortness  of breath. Negative for wheezing.   Gastrointestinal:  Positive for nausea. Negative for diarrhea and vomiting.  Hematological: Negative.   Psychiatric/Behavioral: Negative.       Physical Exam Triage Vital Signs ED Triage Vitals [12/14/21 0822]  Enc Vitals Group     BP 121/72  Pulse Rate 81     Resp 18     Temp 98.5 F (36.9 C)     Temp Source Oral     SpO2 98 %     Weight      Height      Head Circumference      Peak Flow      Pain Score 3     Pain Loc      Pain Edu?      Excl. in [place]?    No data found.  Updated Vital Signs BP 121/72 (BP Location: Left Arm)   Pulse 81   Temp 98.5 F (36.9 C) (Oral)   Resp 18   LMP 11/02/2021 (Approximate)   SpO2 98%   Visual Acuity Right Eye Distance:   Left Eye Distance:   Bilateral Distance:    Right Eye Near:   Left Eye Near:    Bilateral Near:     Physical Exam Vitals and nursing note reviewed.  Constitutional:      Appearance: Normal appearance. She is not ill-appearing.  HENT:     Head: Normocephalic and atraumatic.     Right Ear: Tympanic membrane, ear canal and external ear normal. There is no impacted cerumen.     Left Ear: Tympanic membrane, ear canal and external ear normal. There is no impacted cerumen.     Nose: Congestion and rhinorrhea present.     Mouth/Throat:     Mouth: Mucous membranes are moist.     Pharynx: Oropharynx is clear. Posterior oropharyngeal erythema present. No oropharyngeal exudate.  Eyes:     General: No scleral icterus.       Right eye: Discharge present.        Left eye: Discharge present.    Extraocular Movements: Extraocular movements intact.     Pupils: Pupils are equal, round, and reactive to light.  Cardiovascular:     Rate and Rhythm: Normal rate and regular rhythm.     Pulses: Normal pulses.     Heart sounds: Normal heart sounds. No murmur heard.    No friction rub. No gallop.  Pulmonary:     Effort: Pulmonary effort is normal.     Breath sounds: Normal breath  sounds. No wheezing, rhonchi or rales.  Musculoskeletal:     Cervical back: Normal range of motion and neck supple.  Lymphadenopathy:     Cervical: No cervical adenopathy.  Skin:    General: Skin is warm and dry.     Capillary Refill: Capillary refill takes less than 2 seconds.     Findings: No erythema or rash.  Neurological:     General: No focal deficit present.     Mental Status: She is alert and oriented to person, place, and time.  Psychiatric:        Mood and Affect: Mood normal.        Behavior: Behavior normal.        Thought Content: Thought content normal.        Judgment: Judgment normal.      UC Treatments / Results  Labs (all labs ordered are listed, but only abnormal results are displayed) Labs Reviewed - No data to display  EKG   Radiology No results found.  Procedures Procedures (including critical care time)  Medications Ordered in UC Medications - No data to display  Initial Impression / Assessment and Plan / UC Course  I have reviewed the triage vital signs and the nursing notes.  Pertinent labs &  imaging results that were available during my care of the patient were reviewed by me and considered in my medical decision making (see chart for details).  Patient is a pleasant, nontoxic-appearing 56 old female here for evaluation of ocular and respiratory complaints as outlined in HPI above.  Her physical exam reveals pearly-gray tympanic membranes bilaterally with normal light reflex and clear external auditory canals.  Ocular exam reveals yellow mucopurulent discharge in the upper and lower lashes of both eyes.  Her pupils are equal round reactive and EOMs intact.  Labral and bulbar conjunctiva are erythematous and injected bilaterally.  Nasal mucosa erythematous and markedly edematous with purulent discharge in the left nare.  Patient does have tenderness to percussion of bilateral maxillary sinuses.  Oropharyngeal exam reveals mild posterior  oropharyngeal erythema with greenish postnasal drip.  No cervical lymphadenopathy appreciable exam.  Cardiopulmonary exam feels S1-S2 heart sounds with regular rate and rhythm and lung sounds are clear to auscultation in all fields.  Patient Damas consistent with conjunctivitis and maxillary sinusitis.  I will treat her with cefdinir for the sinusitis.  She does have an allergy to amoxicillin but states that she can take Keflex without difficulty and she has been on cefdinir in the past.  I will also give Vigamox for treatment of the conjunctivitis.  For the remainder of her respiratory symptoms I will prescribe Atrovent nasal spray, sinus irrigation, Tessalon Perles, and Promethazine DM cough syrup.  Return precautions provided.  Work note provided.   Final Clinical Impressions(s) / UC Diagnoses   Final diagnoses:  Acute non-recurrent maxillary sinusitis  Acute bacterial conjunctivitis of both eyes     Discharge Instructions      The Cefdinir twice daily with food for 10 days for treatment of your sinusitis.  Perform sinus irrigation 2-3 times a day with a [employer] sinus rinse kit and distilled water.  Do not use tap water.  You can use plain over-the-counter Mucinex every 6 hours to break up the stickiness of the mucus so your body can clear it.  Increase your oral fluid intake to thin out your mucus so that is also able for your body to clear more easily.  Take an over-the-counter probiotic, such as Culturelle-align-activia, 1 hour after each dose of antibiotic to prevent diarrhea.  Use the Atrovent nasal spray, 2 squirts in each nostril every 6 hours, as needed for runny nose and postnasal drip.  Use the Tessalon Perles every 8 hours during the day.  Take them with a small sip of water.  They may give you some numbness to the base of your tongue or a metallic taste in your mouth, this is normal.  Use the Promethazine DM cough syrup at bedtime for cough and congestion.  It will make you  drowsy so do not take it during the day.  Instill 1 drop of Vigamox in each eye every 8 hours for the next 7 days for treatment of your conjunctivitis.  Avoid touching your eyes as much as possible.  Wipe down all surfaces, countertops, and doorknobs after the first and second 24 hours on eyedrops.  Wash her face with a clean wash rag to remove any drainage and use a different portion of the wash rag to clean each eye so as to not reinfect yourself.  If you develop any new or worsening symptoms return for reevaluation or see your primary care provider.      ED Prescriptions     Medication Sig Dispense Auth. Provider  moxifloxacin (VIGAMOX) 0.5 % ophthalmic solution Place 1 drop into both eyes 3 (three) times daily for 7 days. 3 mL Margarette Canada, NP   benzonatate (TESSALON) 100 MG capsule Take 2 capsules (200 mg total) by mouth every 8 (eight) hours. 21 capsule Margarette Canada, NP   cefdinir (OMNICEF) 300 MG capsule Take 1 capsule (300 mg total) by mouth 2 (two) times daily. 10 capsule Margarette Canada, NP   ipratropium (ATROVENT) 0.06 % nasal spray Place 2 sprays into both nostrils 4 (four) times daily. 15 mL Margarette Canada, NP   promethazine-dextromethorphan (PROMETHAZINE-DM) 6.25-15 MG/5ML syrup Take 5 mLs by mouth 4 (four) times daily as needed. 118 mL Margarette Canada, NP      PDMP not reviewed this encounter.   Margarette Canada, NP 12/14/21 [id number]

## 2021-12-14 NOTE — Discharge Instructions (Signed)
The Cefdinir twice daily with food for 10 days for treatment of your sinusitis.  Perform sinus irrigation 2-3 times a day with a [employer]Med sinus rinse kit and distilled water.  Do not use tap water.  You can use plain over-the-counter Mucinex every 6 hours to break up the stickiness of the mucus so your body can clear it.  Increase your oral fluid intake to thin out your mucus so that is also able for your body to clear more easily.  Take an over-the-counter probiotic, such as Culturelle-align-activia, 1 hour after each dose of antibiotic to prevent diarrhea.  Use the Atrovent nasal spray, 2 squirts in each nostril every 6 hours, as needed for runny nose and postnasal drip.  Use the Tessalon Perles every 8 hours during the day.  Take them with a small sip of water.  They may give you some numbness to the base of your tongue or a metallic taste in your mouth, this is normal.  Use the Promethazine DM cough syrup at bedtime for cough and congestion.  It will make you drowsy so do not take it during the day.  Instill 1 drop of Vigamox in each eye every 8 hours for the next 7 days for treatment of your conjunctivitis.  Avoid touching your eyes as much as possible.  Wipe down all surfaces, countertops, and doorknobs after the first and second 24 hours on eyedrops.  Wash her face with a clean wash rag to remove any drainage and use a different portion of the wash rag to clean each eye so as to not reinfect yourself.  If you develop any new or worsening symptoms return for reevaluation or see your primary care provider.

## 2021-12-17 ENCOUNTER — Encounter: Payer: Self-pay | Admitting: Family Medicine

## 2021-12-17 MED ORDER — PANTOPRAZOLE SODIUM 40 MG PO TBEC: 80.0000 mg | DELAYED_RELEASE_TABLET | Freq: Every day | ORAL | 1 refills | Status: DC

## 2022-01-31 ENCOUNTER — Ambulatory Visit: Payer: BC Managed Care – PPO | Admitting: Family Medicine

## 2022-02-04 ENCOUNTER — Ambulatory Visit: Payer: BC Managed Care – PPO | Admitting: Family Medicine

## 2022-03-20 ENCOUNTER — Other Ambulatory Visit: Payer: Self-pay | Admitting: Family Medicine

## 2022-03-20 DIAGNOSIS — Z1231 Encounter for screening mammogram for malignant neoplasm of breast: Secondary | ICD-10-CM

## 2022-04-16 ENCOUNTER — Ambulatory Visit
Admission: RE | Admit: 2022-04-16 | Discharge: 2022-04-16 | Disposition: A | Discharge status: Home / Self Care | Payer: Managed Care, Other (non HMO) | Source: Ambulatory Visit | Attending: Family Medicine | Admitting: Family Medicine

## 2022-04-16 DIAGNOSIS — Z1231 Encounter for screening mammogram for malignant neoplasm of breast: Secondary | ICD-10-CM | POA: Insufficient documentation

## 2022-04-22 ENCOUNTER — Ambulatory Visit: Payer: Self-pay | Admitting: *Deleted

## 2022-04-22 NOTE — Telephone Encounter (Signed)
  Chief Complaint: coughing spells, chest discomfort when coughing.  Symptoms: nasal congestion, runny nose, nose burns, chest discomfort coughing. Coughing spells. No taste. Dizziness. Reports negative covid testing.  Frequency: on going  Pertinent Negatives: Patient denies chest pain difficulty breathing , no fever.  Disposition: [] ED /[] Urgent Care (no appt availability in office) / [x] Appointment(In office/virtual)/ []  [place] [facility]/ [] Home Care/ [] Refused Recommended Disposition /[] [place] [facility]/ []  Follow-up with PCP Additional Notes:   Recommended to go to ED if sx worsen tonight .    Reason for Disposition  SEVERE coughing spells (e.g., whooping sound after coughing, vomiting after coughing)  Answer Assessment - Initial Assessment Questions 1. ONSET: "When did the cough begin?"      On going  2. SEVERITY: "How bad is the cough today?"      Getting worse. Causing chest discomfort  3. SPUTUM: "Describe the color of your sputum" (none, dry cough; clear, white, yellow, green)     Not sure  4. HEMOPTYSIS: "Are you coughing up any blood?" If so ask: "How much?" (flecks, streaks, tablespoons, etc.)     No  5. DIFFICULTY BREATHING: "Are you having difficulty breathing?" If Yes, ask: "How bad is it?" (e.g., mild, moderate, severe)    - MILD: No SOB at rest, mild SOB with walking, speaks normally in sentences, can lie down, no retractions, pulse < 100.    - MODERATE: SOB at rest, SOB with minimal exertion and prefers to sit, cannot lie down flat, speaks in phrases, mild retractions, audible wheezing, pulse 100-120.    - SEVERE: Very SOB at rest, speaks in single words, struggling to breathe, sitting hunched forward, retractions, pulse > 120      Na  6. FEVER: "Do you have a fever?" If Yes, ask: "What is your temperature, how was it measured, and when did it start?"     No  7. CARDIAC HISTORY: "Do you have any history of heart disease?" (e.g., heart attack, congestive  heart failure)      na 8. LUNG HISTORY: "Do you have any history of lung disease?"  (e.g., pulmonary embolus, asthma, emphysema)     na 9. PE RISK FACTORS: "Do you have a history of blood clots?" (or: recent major surgery, recent prolonged travel, bedridden)     na 10. OTHER SYMPTOMS: "Do you have any other symptoms?" (e.g., runny nose, wheezing, chest pain)       Runny nose , nose burns . Nasal congestion. Difficulty breathing through nose . Coughing spells. Chest discomfort. Dizziness, no taste. Reports negative covid test  11. PREGNANCY: "Is there any chance you are pregnant?" "When was your last menstrual period?"       na 12. TRAVEL: "Have you traveled out of the country in the last month?" (e.g., travel history, exposures)       na  Protocols used: Cough - Acute Productive-A-AH

## 2022-04-23 ENCOUNTER — Ambulatory Visit: Payer: Managed Care, Other (non HMO) | Admitting: Family Medicine

## 2022-04-23 ENCOUNTER — Encounter: Payer: Self-pay | Admitting: Family Medicine

## 2022-04-23 VITALS — BP 104/75 | HR 102 | Temp 98.6°F | Wt 299.2 lb

## 2022-04-23 DIAGNOSIS — R059 Cough, unspecified: Secondary | ICD-10-CM

## 2022-04-23 MED ORDER — PREDNISONE 50 MG PO TABS: 50.0000 mg | ORAL_TABLET | Freq: Every day | ORAL | 0 refills | Status: DC

## 2022-04-23 MED ORDER — BENZONATATE 200 MG PO CAPS: 200.0000 mg | ORAL_CAPSULE | Freq: Two times a day (BID) | ORAL | 0 refills | Status: DC | PRN

## 2022-04-23 NOTE — Progress Notes (Signed)
BP 104/75   Pulse (!) 102   Temp 98.6 F (37 C)   Wt 299 lb 3.2 oz (135.7 kg)   LMP 04/16/2022   SpO2 97%   BMI 46.86 kg/m    Subjective:    Patient ID: Kimberly Santiago, female    DOB: 1975-09-19, 46 y.o.   MRN: [id number]  HPI: Kimberly Santiago is a 46 y.o. female  Chief Complaint  Patient presents with   Cough    Patient states she has been coughing and chest congestion since sunday. Patient states she gets short of breath.    Sore Throat    Patient states she had ST over the weekend but better now.    UPPER RESPIRATORY TRACT INFECTION Duration: 4 days Worst symptom: chest congestion and cough Fever: yes Cough: yes Shortness of breath: yes Wheezing: yes Chest pain: yes, with cough Chest tightness: yes Chest congestion: yes Nasal congestion: yes Runny nose: yes Post nasal drip: yes Sneezing: no Sore throat: yes Swollen glands: no Sinus pressure: yes Headache: yes Face pain: yes Toothache: yes Ear pain: yes "right Ear pressure: no  Eyes red/itching:no Eye drainage/crusting: no  Vomiting: no Rash: no Fatigue: yes Sick contacts: yes Strep contacts: yes  Context: worse Recurrent sinusitis: no Relief with OTC cold/cough medications: no  Treatments attempted: cold/sinus, mucinex, and anti-histamine   Relevant past medical, surgical, family and social history reviewed and updated as indicated. Interim medical history since our last visit reviewed. Allergies and medications reviewed and updated.  Review of Systems  Constitutional:  Positive for chills, diaphoresis, fatigue and fever. Negative for activity change, appetite change and unexpected weight change.  HENT:  Positive for congestion, ear pain, postnasal drip, rhinorrhea, sinus pressure, sinus pain and sore throat. Negative for dental problem, drooling, ear discharge, facial swelling, hearing loss, mouth sores, nosebleeds, sneezing, tinnitus, trouble swallowing and voice change.   Eyes: Negative.    Respiratory:  Positive for cough and wheezing. Negative for apnea, choking, chest tightness, shortness of breath and stridor.   Cardiovascular: Negative.   Gastrointestinal: Negative.   Musculoskeletal: Negative.   Psychiatric/Behavioral: Negative.      Per HPI unless specifically indicated above     Objective:    BP 104/75   Pulse (!) 102   Temp 98.6 F (37 C)   Wt 299 lb 3.2 oz (135.7 kg)   LMP 04/16/2022   SpO2 97%   BMI 46.86 kg/m   Wt Readings from Last 3 Encounters:  04/23/22 299 lb 3.2 oz (135.7 kg)  11/20/21 (!) 302 lb 11.2 oz (137.3 kg)  11/16/21 (!) 310 lb (140.6 kg)    Physical Exam Vitals and nursing note reviewed.  Constitutional:      General: She is not in acute distress.    Appearance: Normal appearance. She is well-developed. She is obese. She is not ill-appearing, toxic-appearing or diaphoretic.  HENT:     Head: Normocephalic and atraumatic.     Right Ear: Tympanic membrane, ear canal and external ear normal.     Left Ear: Tympanic membrane, ear canal and external ear normal.     Nose: Congestion and rhinorrhea present.     Mouth/Throat:     Mouth: Mucous membranes are moist. No oral lesions.     Pharynx: Oropharynx is clear. Posterior oropharyngeal erythema present. No pharyngeal swelling or oropharyngeal exudate.  Eyes:     General: No scleral icterus.       Right eye: No discharge.  Left eye: No discharge.     Extraocular Movements: Extraocular movements intact.     Conjunctiva/sclera: Conjunctivae normal.     Pupils: Pupils are equal, round, and reactive to light.  Cardiovascular:     Rate and Rhythm: Normal rate and regular rhythm.     Pulses: Normal pulses.     Heart sounds: Normal heart sounds. No murmur heard.    No friction rub. No gallop.  Pulmonary:     Effort: Pulmonary effort is normal. No respiratory distress.     Breath sounds: No stridor. Wheezing present. No rhonchi or rales.  Chest:     Chest wall: No tenderness.   Musculoskeletal:        General: Normal range of motion.     Cervical back: Normal range of motion and neck supple.  Lymphadenopathy:     Cervical: Cervical adenopathy present.  Skin:    General: Skin is warm and dry.     Capillary Refill: Capillary refill takes less than 2 seconds.     Coloration: Skin is not jaundiced or pale.     Findings: No bruising, erythema, lesion or rash.  Neurological:     General: No focal deficit present.     Mental Status: She is alert and oriented to person, place, and time. Mental status is at baseline.  Psychiatric:        Mood and Affect: Mood normal.        Behavior: Behavior normal.        Thought Content: Thought content normal.        Judgment: Judgment normal.     Results for orders placed or performed during the hospital encounter of 11/20/21  Pregnancy, urine POC  Result Value Ref Range   Preg Test, Ur NEGATIVE NEGATIVE  Surgical pathology  Result Value Ref Range   SURGICAL PATHOLOGY      SURGICAL PATHOLOGY CASE: [id number] PATIENT: Kimberly Santiago Surgical Pathology Report     Clinical History: Complex right ovarian cyst, endometriosis (crm)     FINAL MICROSCOPIC DIAGNOSIS:  A. OVARY AND FALLOPIAN TUBE, RIGHT, SALPINGO OOPHORECTOMY: Endometrioma (Endometrial cyst) of an ovary. Accompanying fallopian tube with the fimbrial end. Negative for malignancy.   GROSS DESCRIPTION:  Received fresh is a 6.8 x 5.5 x 3.9 cm previously incised ovary.  The cortical surface is smooth.  The cut surface shows multiple cysts measuring up to 3.5 cm.  The cysts contain clear pale brown fluid.  The cysts linings are smooth with focal hemorrhage.  The remaining parenchyma is tan-white to yellow.  There is a 0.8 x 0.5 cm tubular structure attached to the external surface of the ovary which grossly appears to represent a blood vessel.  There is a separate 1.2 x 1 x 0.4 cm portion of soft tan-red tissue which grossly appears to  represent fallopian tube fimbria.   Sections are submitted in 4 cassettes. 1-3 = ovary 4 = possible fallopian tube (GRP 11/21/2021)   Final Diagnosis performed by Unknown Jim, MD.   Electronically signed 11/22/2021 Technical component performed at [facility], [place] [address], [place], [place] [address].  Professional component performed at [facility]. [place], [place] [address] [place], [place] [address].  Immunohistochemistry Technical component (if applicable) was performed at [facility]. [address], [place], [place],  [address].   IMMUNOHISTOCHEMISTRY DISCLAIMER (if applicable): Some of these immunohistochemical stains may have been developed and the performance characteristics determine by [facility]. Some may  not have been cleared or approved by the U.S. Food and Drug Administration. The FDA has determined that such clearance or approval is not necessary. This test is used for clinical purposes. It should not be regarded as in vestigational or for research. This laboratory is certified under the Clinical Laboratory Improvement Amendments of 1988 (CLIA-88) as qualified to perform high complexity clinical laboratory testing.  The controls stained appropriately.       Assessment & Plan:   Problem List Items Addressed This Visit   None Visit Diagnoses     Cough, unspecified type    -  Primary   Flu and strep negative. Await covid. Will treat symptomatically. Call if not getting better or getting worse.    Relevant Orders   Influenza A & B (STAT)   Rapid Strep screen([facility])   Novel Coronavirus, NAA ([facility])        Follow up plan: Return if symptoms worsen or fail to improve.

## 2022-04-24 LAB — NOVEL CORONAVIRUS, NAA: SARS-CoV-2, NAA: NOT DETECTED

## 2022-04-24 NOTE — Progress Notes (Signed)
Contacted via MyChart   Good evening Kimberly Santiago, your Covid has returned negative:)

## 2022-04-26 ENCOUNTER — Encounter: Payer: Self-pay | Admitting: Family Medicine

## 2022-04-26 LAB — VERITOR FLU A/B WAIVED
Influenza A: NEGATIVE
Influenza B: NEGATIVE

## 2022-04-26 LAB — CULTURE, GROUP A STREP: Strep A Culture: NEGATIVE

## 2022-04-26 LAB — RAPID STREP SCREEN (MED CTR MEBANE ONLY): Strep Gp A Ag, IA W/Reflex: NEGATIVE

## 2022-06-13 ENCOUNTER — Other Ambulatory Visit: Payer: Self-pay | Admitting: Family Medicine

## 2022-06-13 NOTE — Telephone Encounter (Signed)
Requested medication (s) are due for refill today: yes  Requested medication (s) are on the active medication list: yes  Last refill:  09/24/21 #90 1 RF  Future visit scheduled: yes  Notes to clinic:  pt has been out since Sept or October- just wanted to send back to make sure it is ok to refill or note in chart   Requested Prescriptions  Pending Prescriptions Disp Refills   citalopram (CELEXA) 40 MG tablet [Pharmacy Med Name: CITALOPRAM HBR 40 MG TABLET] 90 tablet 1    Sig: TAKE 1 TABLET BY MOUTH EVERY DAY     Psychiatry:  Antidepressants - SSRI Passed - 06/13/2022 12:44 AM      Passed - Completed PHQ-2 or PHQ-9 in the last 360 days      Passed - Valid encounter within last 6 months    Recent Outpatient Visits           1 month ago Cough, unspecified type   [facility] [place], Megan P, DO   7 months ago Epigastric pain   [facility] [place], Megan P, DO   8 months ago Routine general medical examination at a health care facility   [facility], Megan P, DO   9 months ago Greater trochanteric pain syndrome of left lower extremity   [facility] [place], Ocie Bob, MD   9 months ago Greater trochanteric pain syndrome of left lower extremity   [facility] [place], Ocie Bob, MD       Future Appointments             In 3 months Laural Benes, Oralia Rud, DO [facility], [facility]

## 2022-06-13 NOTE — Telephone Encounter (Signed)
Please confirm with patient that she has been taking medicine

## 2022-08-27 ENCOUNTER — Telehealth: Payer: Self-pay

## 2022-08-27 NOTE — Transitions of Care (Post Inpatient/ED Visit) (Signed)
   08/27/2022  Name: Kimberly Santiago MRN: [id number] DOB: 05/26/1976  Today's TOC FU Call Status: Today's TOC FU Call Status:: Successful TOC FU Call Competed TOC FU Call Complete Date: 08/27/22  Transition Care Management Follow-up Telephone Call Discharge Facility: Other ([facility]) Name of Other ([place]) Discharge Facility: [facility] Emergency Department Type of Discharge: Emergency Department Reason for ED Visit: Other: How have you been since you were released from the hospital?: Same Any questions or concerns?: No  Items Reviewed: Did you receive and understand the discharge instructions provided?: Yes Medications obtained and verified?: Yes (Medications Reviewed) Any new allergies since your discharge?: No Dietary orders reviewed?: No Do you have support at home?: Yes People in Home: spouse, child(ren), dependent  Home Care and Equipment/Supplies: [place] Ordered?: No Any new equipment or medical supplies ordered?: No  Functional Questionnaire: Do you need assistance with bathing/showering or dressing?: No Do you need assistance with meal preparation?: No Do you need assistance with eating?: No Do you have difficulty maintaining continence: No Do you need assistance with getting out of bed/getting out of a chair/moving?: No Do you have difficulty managing or taking your medications?: No  Folllow up appointments reviewed: PCP Follow-up appointment confirmed?: No MD Provider Line Number:[phone] Given: No [facility] Follow-up appointment confirmed?: NA Do you need transportation to your follow-up appointment?: No Do you understand care options if your condition(s) worsen?: Yes-patient verbalized understanding    SIGNATURE: Yvonna Alanis, [place]

## 2022-09-08 ENCOUNTER — Other Ambulatory Visit: Payer: Self-pay | Admitting: Family Medicine

## 2022-09-09 NOTE — Telephone Encounter (Signed)
Requested medication (s) are due for refill today: yes  Requested medication (s) are on the active medication list: yes  Last refill:  06/13/22 #90 0 refills  Future visit scheduled: yes in 2 weeks  Notes to clinic:  no refills remain. Do you want to refill Rx?     Requested Prescriptions  Pending Prescriptions Disp Refills   citalopram (CELEXA) 40 MG tablet [Pharmacy Med Name: CITALOPRAM HBR 40 MG TABLET] 90 tablet 0    Sig: TAKE 1 TABLET BY MOUTH EVERY DAY     Psychiatry:  Antidepressants - SSRI Passed - 09/08/2022  1:03 AM      Passed - Completed PHQ-2 or PHQ-9 in the last 360 days      Passed - Valid encounter within last 6 months    Recent Outpatient Visits           4 months ago Cough, unspecified type   [place], Megan P, DO   10 months ago Epigastric pain   [place], [place], DO   11 months ago Routine general medical examination at a health care facility   [place], [place], DO   1 year ago Greater trochanteric pain syndrome of left lower extremity   [facility] [place], Earley Abide, MD   1 year ago Greater trochanteric pain syndrome of left lower extremity   [facility] [place], Earley Abide, MD       Future Appointments             In 2 weeks Valerie Roys, DO [place], [facility]

## 2022-09-13 NOTE — Telephone Encounter (Signed)
Does she have enough to get to her appt?

## 2022-09-15 ENCOUNTER — Other Ambulatory Visit: Payer: Self-pay | Admitting: Family Medicine

## 2022-09-16 NOTE — Telephone Encounter (Signed)
Spoke with patient to follow up with her refill request. Patient states she has enough to last until her scheduled appointment on 09/26/22. Patient thanks [place] for calling to check. Patient says she is not sure why they sent the request.

## 2022-09-16 NOTE — Telephone Encounter (Signed)
Requested Prescriptions  Pending Prescriptions Disp Refills   pantoprazole (PROTONIX) 40 MG tablet [Pharmacy Med Name: PANTOPRAZOLE SOD DR 40 MG TAB] 180 tablet 0    Sig: TAKE 2 TABLETS BY MOUTH EVERY DAY     Gastroenterology: Proton Pump Inhibitors Passed - 09/15/2022  1:11 AM      Passed - Valid encounter within last 12 months    Recent Outpatient Visits           4 months ago Cough, unspecified type   [place], [place], DO   10 months ago Epigastric pain   [place], [place], DO   11 months ago Routine general medical examination at a health care facility   [place], [place], DO   1 year ago Greater trochanteric pain syndrome of left lower extremity   [facility] [place], Earley Abide, MD   1 year ago Greater trochanteric pain syndrome of left lower extremity   [facility] [place], MD       Future Appointments             In 1 week Kimberly Roys, DO [place], [place]

## 2022-09-26 ENCOUNTER — Ambulatory Visit (INDEPENDENT_AMBULATORY_CARE_PROVIDER_SITE_OTHER): Payer: Managed Care, Other (non HMO) | Admitting: Family Medicine

## 2022-09-26 ENCOUNTER — Ambulatory Visit
Admission: RE | Admit: 2022-09-26 | Discharge: 2022-09-26 | Disposition: A | Discharge status: Home / Self Care | Payer: Managed Care, Other (non HMO) | Source: Ambulatory Visit | Attending: Family Medicine | Admitting: Family Medicine

## 2022-09-26 ENCOUNTER — Encounter: Payer: Self-pay | Admitting: Family Medicine

## 2022-09-26 ENCOUNTER — Ambulatory Visit
Admission: RE | Admit: 2022-09-26 | Discharge: 2022-09-26 | Disposition: A | Discharge status: Home / Self Care | Payer: Managed Care, Other (non HMO) | Attending: Family Medicine | Admitting: Family Medicine

## 2022-09-26 VITALS — BP 92/65 | HR 66 | Temp 99.1°F | Ht 68.25 in | Wt 308.8 lb

## 2022-09-26 DIAGNOSIS — F419 Anxiety disorder, unspecified: Secondary | ICD-10-CM | POA: Diagnosis not present

## 2022-09-26 DIAGNOSIS — F331 Major depressive disorder, recurrent, moderate: Secondary | ICD-10-CM

## 2022-09-26 DIAGNOSIS — Z0000 Encounter for general adult medical examination without abnormal findings: Secondary | ICD-10-CM | POA: Diagnosis not present

## 2022-09-26 DIAGNOSIS — M25562 Pain in left knee: Secondary | ICD-10-CM

## 2022-09-26 DIAGNOSIS — Z6841 Body Mass Index (BMI) 40.0 and over, adult: Secondary | ICD-10-CM

## 2022-09-26 DIAGNOSIS — M25462 Effusion, left knee: Secondary | ICD-10-CM

## 2022-09-26 LAB — MICROSCOPIC EXAMINATION
Bacteria, UA: NONE SEEN
RBC, Urine: NONE SEEN /hpf (ref 0–2)

## 2022-09-26 LAB — URINALYSIS, ROUTINE W REFLEX MICROSCOPIC
Bilirubin, UA: NEGATIVE
Glucose, UA: NEGATIVE
Ketones, UA: NEGATIVE
Leukocytes,UA: NEGATIVE
Nitrite, UA: NEGATIVE
RBC, UA: NEGATIVE
Specific Gravity, UA: 1.02 (ref 1.005–1.030)
Urobilinogen, Ur: 2 mg/dL — ABNORMAL HIGH (ref 0.2–1.0)
pH, UA: 7 (ref 5.0–7.5)

## 2022-09-26 LAB — BAYER DCA HB A1C WAIVED: HB A1C (BAYER DCA - WAIVED): 5.6 % (ref 4.8–5.6)

## 2022-09-26 MED ORDER — CITALOPRAM HYDROBROMIDE 40 MG PO TABS: 40.0000 mg | ORAL_TABLET | Freq: Every day | ORAL | 0 refills | Status: DC

## 2022-09-26 MED ORDER — BUSPIRONE HCL 7.5 MG PO TABS: 7.5000 mg | ORAL_TABLET | Freq: Two times a day (BID) | ORAL | 3 refills | Status: DC

## 2022-09-26 NOTE — Patient Instructions (Addendum)
Wegovy or Zepbound  What is misophonia? People with misophonia are affected emotionally by common sounds -- usually those made by others, and usually ones that other people don't pay attention to. The examples above (breathing, yawning, or chewing) create a fight-or-flight response that triggers anger and a desire to escape. Misophonia is little studied and we don't know how common it is. It affects some worse than others and can lead to isolation, as people suffering from this condition try to avoid these trigger sounds. People who have misophonia often feel embarrassed and don't mention it to healthcare providers -- and often healthcare providers haven't heard of it anyway. Nonetheless, misophonia is a real disorder and one that seriously compromises functioning, socializing, and ultimately mental health. Misophonia usually appears around age 46, and likely affects more people than we realize.  What causes misophonia? New research has started to identify causes for misophonia. A [place]-based research team studied 20 adults with misophonia and 22 without it. They all rated the unpleasantness of different sounds, including common trigger sounds (eating and breathing), universally disturbing sounds (of babies crying and people screaming), and neutral sounds (such as rain). As expected, persons with misophonia rated the trigger sounds of eating and breathing as highly disturbing while those without it did not. Both groups rated the unpleasantness of babies crying and people screaming about the same, as they did the neutral sounds. This confirmed that the misophonic persons were far more affected by specific trigger sounds, but don't differ much from others regarding other types of sounds.  The researchers also noted that persons with misophonia showed much greater physiological signs of stress (increased sweat and heart rate) to the trigger sounds of eating and breathing than those without it. No significant  difference was found between the groups for the neutral sounds or the disturbing sounds of a baby crying or people screaming.  The brain science of misophonia The team's important finding was in a part of the brain that plays a role both in anger and in integrating outside inputs (such as sounds) with inputs from organs such as the heart and lungs: the anterior insular cortex (AIC). Using fMRI scans to measure brain activity, the researchers found that the [facility] caused much more activity in other parts of the brain during the trigger sounds for those with misophonia than for the control group. Specifically, the parts of the brain responsible for long-term memories, fear, and other emotions were activated. This makes sense, since people with misophonia have strong emotional reactions to common sounds; more importantly, it demonstrates that these parts of the brain are the ones responsible for the experience of misophonia.  The researchers also used whole-brain MRI scans to map participants' brains and found that people with misophonia have higher amounts of myelination. Myelin is a fatty substance that wraps around nerve cells in the brain to provide electrical insulation, like the insulation on a wire. It's not known if the extra myelin is a cause or an effect of misophonia and its triggering of other brain areas.  There is some good news regarding misophonia Misophonia clinics exist throughout the [place] and elsewhere, and treatments such as auditory distraction (with white noise or headphones) and cognitive behavioral therapy have shown some success in improving functioning. For more information, contact the Misophonia Association.

## 2022-09-26 NOTE — Assessment & Plan Note (Signed)
Would benefit from GLP-1. Will check on coverage- if covered, we will start. Call with any concerns.

## 2022-09-26 NOTE — Assessment & Plan Note (Signed)
Not doing great. Would like to try increasing buspar before increasing her celexa. Recheck 1 month. Call with any concerns.

## 2022-09-26 NOTE — Progress Notes (Signed)
BP 92/65   Pulse 66   Temp 99.1 F (37.3 C) (Oral)   Ht 5' 8.25" (1.734 m)   Wt (!) 308 lb 12.8 oz (140.1 kg)   LMP 09/05/2022 (Approximate)   SpO2 98%   BMI 46.61 kg/m    Subjective:    Patient ID: Kimberly Santiago, female    DOB: [birth year], 47 y.o.   MRN: [id number]  HPI: Kimberly Santiago is a 47 y.o. female presenting on 09/26/2022 for comprehensive medical examination. Current medical complaints include:  ANXIETY/DEPRESSION Duration: chronic Status:exacerbated Anxious mood: yes  Excessive worrying: yes Irritability: no  Sweating: no Nausea: no Palpitations:no Hyperventilation: no Panic attacks: no Agoraphobia: no  Obscessions/compulsions: no Depressed mood: yes    09/26/2022    8:15 AM 04/23/2022    1:20 PM 10/30/2021    4:31 PM 09/24/2021    8:23 AM 08/31/2021   10:54 AM  Depression screen PHQ 2/9  Decreased Interest 0 0 1 1 0  Down, Depressed, Hopeless 1 0 1 1 1   PHQ - 2 Score 1 0 2 2 1   Altered sleeping 0 0 2 1 1   Tired, decreased energy 1 1 2 1 1   Change in appetite 1 0 1 1 1   Feeling bad or failure about yourself  0 0 1 1 1   Trouble concentrating 0 0 0 0 0  Moving slowly or fidgety/restless 0 0 0 0 0  Suicidal thoughts 0 0 0 0 0  PHQ-9 Score 3 1 8 6 5   Difficult doing work/chores Not difficult at all Not difficult at all   Not difficult at all   Anhedonia: no Weight changes: no Insomnia: no   Hypersomnia: no Fatigue/loss of energy: yes Feelings of worthlessness: yes Feelings of guilt: yes Impaired concentration/indecisiveness: yes Suicidal ideations: no  Crying spells: no Recent Stressors/Life Changes: yes   Relationship problems: yes   Family stress: yes     Financial stress: yes    Job stress: no    Recent death/loss: no  KNEE PAIN Duration: months Involved knee: left Mechanism of injury: unknown Location:medial Onset: sudden Severity: moderate  Quality:  aching and tight Frequency: constant Radiation: no Aggravating factors: weight  bearing, walking, running, stairs, bending, movement, and prolonged sitting  Alleviating factors: nothing  Status: worse Treatments attempted: rest, ice, heat, APAP, ibuprofen, and aleve  Relief with NSAIDs?:  No NSAIDs Taken Weakness with weight bearing or walking: no Sensation of giving way: yes Locking: no Popping: no Bruising: yes Swelling: yes Redness: no Paresthesias/decreased sensation: no Fevers: no  WEIGHT GAIN Duration: chronic Previous attempts at weight loss: yes- including bariatric surgery Complications of obesity: GERD, OSA, insulin resistance, PCOS, Depression, low back pain Peak weight: current (308) Weight loss goal: to be healthy Weight loss to date: none Requesting obesity pharmacotherapy: no Current weight loss supplements/medications: no Previous weight loss supplements/meds: no  She currently lives with: husband and kids Menopausal Symptoms: no  Depression Screen done today and results listed below:     09/26/2022    8:15 AM 04/23/2022    1:20 PM 10/30/2021    4:31 PM 09/24/2021    8:23 AM 08/31/2021   10:54 AM  Depression screen PHQ 2/9  Decreased Interest 0 0 1 1 0  Down, Depressed, Hopeless 1 0 1 1 1   PHQ - 2 Score 1 0 2 2 1   Altered sleeping 0 0 2 1 1   Tired, decreased energy 1 1 2 1 1   Change in appetite 1  0 1 1 1   Feeling bad or failure about yourself  0 0 1 1 1   Trouble concentrating 0 0 0 0 0  Moving slowly or fidgety/restless 0 0 0 0 0  Suicidal thoughts 0 0 0 0 0  PHQ-9 Score 3 1 8 6 5   Difficult doing work/chores Not difficult at all Not difficult at all   Not difficult at all    Past Medical History:  Past Medical History:  Diagnosis Date   Anemia    Anxiety    Depression    Endometriosis    Gastritis, chronic    GERD (gastroesophageal reflux disease)    Hiatal hernia    SMALL   History of Barrett's esophagus    per pt last EGD 07-07-2020 no evidence noted   History of obstructive sleep apnea    per pt prior to gastric  bypass 2016, stated retested after bypass and tested negative   History of panic attacks    History of thrombophlebitis 2005   per pt LLE superficial   Migraines    OAB (overactive bladder)    Ovarian cyst, right    PCOS (polycystic ovarian syndrome)    PONV (postoperative nausea and vomiting)    S/P gastric bypass 09/08/2014   SUI (stress urinary incontinence, female)    Thyroid nodule    hx bx 02-09-2016  , benign,  in care everywhere    Surgical History:  Past Surgical History:  Procedure Laterality Date   CESAREAN SECTION N/A 08/18/2017   Procedure: Primary CESAREAN SECTION;  Surgeon: Aloha Gell, MD;  Location: [place];  Service: Obstetrics;  Laterality: N/A;  EDD: 09/13/17 Allergy: Amoxicillin, Morphine, Hydrocodone   CESAREAN SECTION WITH BILATERAL TUBAL LIGATION Bilateral 07/27/2019   Procedure: Repeat CESAREAN SECTION WITH BILATERAL TUBAL LIGATION;  Surgeon: Aloha Gell, MD;  Location: [facility];  Service: Obstetrics;  Laterality: Bilateral;  EDD: 07/31/19   COLONOSCOPY WITH PROPOFOL N/A 07/07/2020   Procedure: COLONOSCOPY WITH PROPOFOL;  Surgeon: Lucilla Lame, MD;  Location: [place];  Service: Endoscopy;  Laterality: N/A;   DIAGNOSTIC LAPAROSCOPY  2006   for endometriosis   EAR CYST EXCISION N/A 11/24/2014   Procedure: CYST REMOVAL;  Surgeon: Carloyn Manner, MD;  Location: [facility];  Service: ENT;  Laterality: N/A;  upper lip   ESOPHAGOGASTRODUODENOSCOPY (EGD) WITH PROPOFOL N/A 07/07/2020   Procedure: ESOPHAGOGASTRODUODENOSCOPY (EGD) WITH PROPOFOL;  Surgeon: Lucilla Lame, MD;  Location: [place];  Service: Endoscopy;  Laterality: N/A;  sleep apnea   ETHMOIDECTOMY Right 05/07/2018   Procedure: ETHMOIDECTOMY;  Surgeon: Carloyn Manner, MD;  Location: [facility];  Service: ENT;  Laterality: Right;   FRONTAL SINUS EXPLORATION Right 05/07/2018   Procedure: FRONTAL SINUS EXPLORATION;  Surgeon: Carloyn Manner, MD;  Location: [facility];   Service: ENT;  Laterality: Right;   IMAGE GUIDED SINUS SURGERY N/A 05/07/2018   Procedure: IMAGE GUIDED SINUS SURGERY;  Surgeon: Carloyn Manner, MD;  Location: [facility];  Service: ENT;  Laterality: N/A;   KNEE ARTHROSCOPY Right 2006   LAPAROSCOPIC CHOLECYSTECTOMY  2011   LAPAROSCOPIC GASTRIC BYPASS  08/30/2014   @[facility] ;   AND ROUX-EN-Y GASTROENTEROSTOMY   LAPAROSCOPIC SALPINGO OOPHERECTOMY Right 11/20/2021   Procedure: LAPAROSCOPIC SALPINGO OOPHORECTOMY;  Surgeon: Aloha Gell, MD;  Location: [place];  Service: Gynecology;  Laterality: Right;   MAXILLARY ANTROSTOMY Bilateral 05/07/2018   Procedure: MAXILLARY ANTROSTOMY;  Surgeon: Carloyn Manner, MD;  Location: [facility];  Service: ENT;  Laterality: Bilateral;   NASAL  SEPTOPLASTY W/ TURBINOPLASTY Bilateral 05/07/2018   Procedure: NASAL SEPTOPLASTY WITH TURBINATE REDUCTION;  Surgeon: Carloyn Manner, MD;  Location: [facility];  Service: ENT;  Laterality: Bilateral;   SPHENOIDECTOMY Right 05/07/2018   Procedure: Coralee Pesa;  Surgeon: Carloyn Manner, MD;  Location: [facility];  Service: ENT;  Laterality: Right;   TEMPOROMANDIBULAR JOINT SURGERY Right 2003   WISDOM TOOTH EXTRACTION      Medications:  Current Outpatient Medications on File Prior to Visit  Medication Sig   acetaminophen (TYLENOL) 325 MG tablet Take 650 mg by mouth every 6 (six) hours as needed for mild pain or headache.   cyclobenzaprine (FLEXERIL) 10 MG tablet Take 1 tablet (10 mg total) by mouth at bedtime.   diclofenac (VOLTAREN) 50 MG EC tablet Take 1 tablet (50 mg total) by mouth 2 (two) times daily.   diclofenac Sodium (VOLTAREN) 1 % GEL Apply 4 g topically 4 (four) times daily. (Patient taking differently: Apply 4 g topically 4 (four) times daily as needed.)   famotidine (PEPCID) 20 MG tablet Take 1 tablet (20 mg total) by mouth at bedtime. (Patient taking differently: Take 20 mg by mouth at bedtime.)   fexofenadine (ALLEGRA) 180 MG tablet  Take 180 mg by mouth daily.   gabapentin (NEURONTIN) 100 MG capsule Take 1-3 capsules (100-300 mg total) by mouth at bedtime. (Patient taking differently: Take 100-300 mg by mouth at bedtime as needed.)   JUNEL FE 1/20 1-20 MG-MCG tablet Take 1 tablet by mouth daily.   nystatin (MYCOSTATIN/NYSTOP) powder Apply 1 application topically 3 (three) times daily. (Patient taking differently: Apply 1 application  topically 3 (three) times daily as needed.)   oxyCODONE-acetaminophen (PERCOCET) 5-325 MG tablet Take 2 tablets by mouth every 6 (six) hours as needed for severe pain.   pantoprazole (PROTONIX) 40 MG tablet TAKE 2 TABLETS BY MOUTH EVERY DAY   sucralfate (CARAFATE) 1 GM/10ML suspension Take 1 g by mouth 4 (four) times daily as needed.   traMADol (ULTRAM) 50 MG tablet Take 1 tablet (50 mg total) by mouth every 6 (six) hours as needed.   dicyclomine (BENTYL) 20 MG tablet Take by mouth.   [DISCONTINUED] fluticasone (FLONASE) 50 MCG/ACT nasal spray Place 2 sprays into both nostrils at bedtime.   No current facility-administered medications on file prior to visit.    Allergies:  Allergies  Allergen Reactions   Hydromorphone Shortness Of Breath, Nausea And Vomiting and Other (See Comments)    Chest pain   Hydrocodone Nausea And Vomiting and Other (See Comments)    Headache, Patient can take oxycodone.   Morphine And Related Nausea And Vomiting and Other (See Comments)    Headache   Nsaids Other (See Comments)    Pt cannot take due to Gastric Bypass Surgery   Amoxicillin Itching    Can take cephalexin. Did it involve swelling of the face/tongue/throat, SOB, or low BP? No Did it involve sudden or severe rash/hives, skin peeling, or any reaction on the inside of your mouth or nose? No Did you need to seek medical attention at a hospital or doctor's office? No When did it last happen?  Several years     If all above answers are "NO", may proceed with cephalosporin use.     Social History:   Social History   Socioeconomic History   Marital status: Married    Spouse name: Cherrita Traughber   Number of children: 2   Years of education: Not on file   Highest education level: Not on file  Occupational History   Not on file  Tobacco Use   Smoking status: Never   Smokeless tobacco: Never  Vaping Use   Vaping Use: Never used  Substance and Sexual Activity   Alcohol use: Not Currently   Drug use: Never   Sexual activity: Yes    Partners: Male    Birth control/protection: Surgical  Other Topics Concern   Not on file  Social History Narrative   Not on file   Social Determinants of Health   Financial Resource Strain: Not on file  Food Insecurity: Not on file  Transportation Needs: Not on file  Physical Activity: Not on file  Stress: Not on file  Social Connections: Not on file  Intimate Partner Violence: Not on file   Social History   Tobacco Use  Smoking Status Never  Smokeless Tobacco Never   Social History   Substance and Sexual Activity  Alcohol Use Not Currently    Family History:  Family History  Problem Relation Age of Onset   Breast cancer Paternal Aunt        pat great aunt   Heart disease Maternal Grandmother    Heart disease Maternal Grandfather    Heart disease Paternal Grandmother    Heart disease Paternal Grandfather    Anxiety disorder Mother    Depression Mother    Miscarriages / [place] Sister     Past medical history, surgical history, medications, allergies, family history and social history reviewed with patient today and changes made to appropriate areas of the chart.   Review of Systems  Constitutional: Negative.   HENT:  Positive for ear pain. Negative for congestion, ear discharge, hearing loss, nosebleeds, sinus pain, sore throat and tinnitus.   Eyes: Negative.   Respiratory: Negative.  Negative for stridor.   Cardiovascular: Negative.   Gastrointestinal:  Positive for abdominal pain. Negative for blood in stool,  constipation, diarrhea, heartburn, melena, nausea and vomiting.  Genitourinary:  Positive for frequency. Negative for dysuria, flank pain, hematuria and urgency.       +incontinence  Musculoskeletal:  Positive for joint pain and neck pain. Negative for back pain, falls and myalgias.  Skin: Negative.   Neurological: Negative.   Endo/Heme/Allergies:  Positive for environmental allergies. Negative for polydipsia. Does not bruise/bleed easily.  Psychiatric/Behavioral:  Positive for depression. Negative for hallucinations, memory loss, substance abuse and suicidal ideas. The patient is nervous/anxious. The patient does not have insomnia.    All other ROS negative except what is listed above and in the HPI.      Objective:    BP 92/65   Pulse 66   Temp 99.1 F (37.3 C) (Oral)   Ht 5' 8.25" (1.734 m)   Wt (!) 308 lb 12.8 oz (140.1 kg)   LMP 09/05/2022 (Approximate)   SpO2 98%   BMI 46.61 kg/m   Wt Readings from Last 3 Encounters:  09/26/22 (!) 308 lb 12.8 oz (140.1 kg)  04/23/22 299 lb 3.2 oz (135.7 kg)  11/20/21 (!) 302 lb 11.2 oz (137.3 kg)    Physical Exam Vitals and nursing note reviewed.  Constitutional:      General: She is not in acute distress.    Appearance: Normal appearance. She is obese. She is not ill-appearing, toxic-appearing or diaphoretic.  HENT:     Head: Normocephalic and atraumatic.     Right Ear: Tympanic membrane, ear canal and external ear normal. There is no impacted cerumen.     Left Ear: Tympanic membrane, ear canal  and external ear normal. There is no impacted cerumen.     Nose: Nose normal. No congestion or rhinorrhea.     Mouth/Throat:     Mouth: Mucous membranes are moist.     Pharynx: Oropharynx is clear. No oropharyngeal exudate or posterior oropharyngeal erythema.  Eyes:     General: No scleral icterus.       Right eye: No discharge.        Left eye: No discharge.     Extraocular Movements: Extraocular movements intact.     Conjunctiva/sclera:  Conjunctivae normal.     Pupils: Pupils are equal, round, and reactive to light.  Neck:     Vascular: No carotid bruit.  Cardiovascular:     Rate and Rhythm: Normal rate and regular rhythm.     Pulses: Normal pulses.     Heart sounds: No murmur heard.    No friction rub. No gallop.  Pulmonary:     Effort: Pulmonary effort is normal. No respiratory distress.     Breath sounds: Normal breath sounds. No stridor. No wheezing, rhonchi or rales.  Chest:     Chest wall: No tenderness.  Abdominal:     General: Abdomen is flat. Bowel sounds are normal. There is no distension.     Palpations: Abdomen is soft. There is no mass.     Tenderness: There is no abdominal tenderness. There is no right CVA tenderness, left CVA tenderness, guarding or rebound.     Hernia: No hernia is present.  Genitourinary:    Comments: Breast and pelvic exams deferred with shared decision making Musculoskeletal:        General: No swelling, tenderness, deformity or signs of injury.     Cervical back: Normal range of motion and neck supple. No rigidity. No muscular tenderness.     Right lower leg: No edema.     Left lower leg: No edema.     Comments: Nodule on lateral side of L knee, feels like a cyst   Lymphadenopathy:     Cervical: No cervical adenopathy.  Skin:    General: Skin is warm and dry.     Capillary Refill: Capillary refill takes less than 2 seconds.     Coloration: Skin is not jaundiced or pale.     Findings: No bruising, erythema, lesion or rash.  Neurological:     General: No focal deficit present.     Mental Status: She is alert and oriented to person, place, and time. Mental status is at baseline.     Cranial Nerves: No cranial nerve deficit.     Sensory: No sensory deficit.     Motor: No weakness.     Coordination: Coordination normal.     Gait: Gait normal.     Deep Tendon Reflexes: Reflexes normal.  Psychiatric:        Mood and Affect: Mood normal.        Behavior: Behavior normal.         Thought Content: Thought content normal.        Judgment: Judgment normal.     Results for orders placed or performed in visit on 09/26/22  Microscopic Examination   Urine  Result Value Ref Range   WBC, UA 0-5 0 - 5 /hpf   RBC, Urine None seen 0 - 2 /hpf   Epithelial Cells (non renal) 0-10 0 - 10 /hpf   Mucus, UA Present (A) Not Estab.   Bacteria, UA None seen None seen/Few  Urinalysis,  Routine w reflex microscopic  Result Value Ref Range   Specific Gravity, UA 1.020 1.005 - 1.030   pH, UA 7.0 5.0 - 7.5   Color, UA Yellow Yellow   Appearance Ur Cloudy (A) Clear   Leukocytes,UA Negative Negative   Protein,UA 1+ (A) Negative/Trace   Glucose, UA Negative Negative   Ketones, UA Negative Negative   RBC, UA Negative Negative   Bilirubin, UA Negative Negative   Urobilinogen, Ur 2.0 (H) 0.2 - 1.0 mg/dL   Nitrite, UA Negative Negative   Microscopic Examination See below:   Bayer DCA Hb A1c Waived  Result Value Ref Range   HB A1C (BAYER DCA - WAIVED) 5.6 4.8 - 5.6 %      Assessment & Plan:   Problem List Items Addressed This Visit       Other   Anxiety    Not doing great. Would like to try increasing buspar before increasing her celexa. Recheck 1 month. Call with any concerns.       Relevant Medications   busPIRone (BUSPAR) 7.5 MG tablet   citalopram (CELEXA) 40 MG tablet   Depression    Not doing great. Would like to try increasing buspar before increasing her celexa. Recheck 1 month. Call with any concerns.       Relevant Medications   busPIRone (BUSPAR) 7.5 MG tablet   citalopram (CELEXA) 40 MG tablet   BMI 45.0-49.9, adult (HCC)    Would benefit from GLP-1. Will check on coverage- if covered, we will start. Call with any concerns.       Other Visit Diagnoses     Routine general medical examination at a health care facility    -  Primary   Vaccines up to date. Screening labs checked today. Pap and mammo up to date. Colonoscopy up to date. Continue diet and  exercise. Call with any concerns.   Relevant Orders   CBC with Differential/Platelet   Comprehensive metabolic panel   Lipid Panel w/o Chol/HDL Ratio   Urinalysis, Routine w reflex microscopic (Completed)   TSH   Bayer DCA Hb A1c Waived (Completed)   Acute pain of left knee       Concern for ganglion cyst- will get x-ray and start stretches. If not getting better, consider MRI.   Relevant Orders   DG Knee Complete 4 Views Left   Swelling of left knee joint       Concern for ganglion cyst- will get x-ray and start stretches. If not getting better, consider MRI.        Follow up plan: Return in about 4 weeks (around 10/24/2022).   LABORATORY TESTING:  - Pap smear: up to date  IMMUNIZATIONS:   - Tdap: Tetanus vaccination status reviewed: last tetanus booster within 10 years. - Influenza: Up to date - Pneumovax: Not applicable - Prevnar: Not applicable - COVID: Up to date - HPV: Not applicable - Shingrix vaccine: Not applicable  SCREENING: -Mammogram: Up to date  - Colonoscopy: Up to date   PATIENT COUNSELING:   Advised to take 1 mg of folate supplement per day if capable of pregnancy.   Sexuality: Discussed sexually transmitted diseases, partner selection, use of condoms, avoidance of unintended pregnancy  and contraceptive alternatives.   Advised to avoid cigarette smoking.  I discussed with the patient that most people either abstain from alcohol or drink within safe limits (<=14/week and <=4 drinks/occasion for males, <=7/weeks and <= 3 drinks/occasion for females) and that the risk for alcohol  disorders and other health effects rises proportionally with the number of drinks per week and how often a drinker exceeds daily limits.  Discussed cessation/primary prevention of drug use and availability of treatment for abuse.   Diet: Encouraged to adjust caloric intake to maintain  or achieve ideal body weight, to reduce intake of dietary saturated fat and total fat, to  limit sodium intake by avoiding high sodium foods and not adding table salt, and to maintain adequate dietary potassium and calcium preferably from fresh fruits, vegetables, and low-fat dairy products.    stressed the importance of regular exercise  Injury prevention: Discussed safety belts, safety helmets, smoke detector, smoking near bedding or upholstery.   Dental health: Discussed importance of regular tooth brushing, flossing, and dental visits.    NEXT PREVENTATIVE PHYSICAL DUE IN 1 YEAR. Return in about 4 weeks (around 10/24/2022).

## 2022-09-27 ENCOUNTER — Encounter: Payer: Self-pay | Admitting: Family Medicine

## 2022-09-27 LAB — CBC WITH DIFFERENTIAL/PLATELET
Basophils Absolute: 0.1 10*3/uL (ref 0.0–0.2)
Basos: 1 %
EOS (ABSOLUTE): 0.3 10*3/uL (ref 0.0–0.4)
Eos: 4 %
Hematocrit: 39.4 % (ref 34.0–46.6)
Hemoglobin: 11.9 g/dL (ref 11.1–15.9)
Immature Grans (Abs): 0 10*3/uL (ref 0.0–0.1)
Immature Granulocytes: 0 %
Lymphocytes Absolute: 2 10*3/uL (ref 0.7–3.1)
Lymphs: 29 %
MCH: 24 pg — ABNORMAL LOW (ref 26.6–33.0)
MCHC: 30.2 g/dL — ABNORMAL LOW (ref 31.5–35.7)
MCV: 80 fL (ref 79–97)
Monocytes Absolute: 0.5 10*3/uL (ref 0.1–0.9)
Monocytes: 8 %
Neutrophils Absolute: 3.9 10*3/uL (ref 1.4–7.0)
Neutrophils: 58 %
Platelets: 375 10*3/uL (ref 150–450)
RBC: 4.95 x10E6/uL (ref 3.77–5.28)
RDW: 13.8 % (ref 11.7–15.4)
WBC: 6.7 10*3/uL (ref 3.4–10.8)

## 2022-09-27 LAB — LIPID PANEL W/O CHOL/HDL RATIO
Cholesterol, Total: 164 mg/dL (ref 100–199)
HDL: 62 mg/dL (ref >39)
LDL Chol Calc (NIH): 87 mg/dL (ref 0–99)
Triglycerides: 81 mg/dL (ref 0–149)
VLDL Cholesterol Cal: 15 mg/dL (ref 5–40)

## 2022-09-27 LAB — COMPREHENSIVE METABOLIC PANEL
ALT: 11 IU/L (ref 0–32)
AST: 12 IU/L (ref 0–40)
Albumin/Globulin Ratio: 1.4 (ref 1.2–2.2)
Albumin: 3.8 g/dL — ABNORMAL LOW (ref 3.9–4.9)
Alkaline Phosphatase: 76 IU/L (ref 44–121)
BUN/Creatinine Ratio: 11 (ref 9–23)
BUN: 10 mg/dL (ref 6–24)
Bilirubin Total: 0.3 mg/dL (ref 0.0–1.2)
CO2: 25 mmol/L (ref 20–29)
Calcium: 8.7 mg/dL (ref 8.7–10.2)
Chloride: 103 mmol/L (ref 96–106)
Creatinine, Ser: 0.9 mg/dL (ref 0.57–1.00)
Globulin, Total: 2.8 g/dL (ref 1.5–4.5)
Glucose: 93 mg/dL (ref 70–99)
Potassium: 4.2 mmol/L (ref 3.5–5.2)
Sodium: 141 mmol/L (ref 134–144)
Total Protein: 6.6 g/dL (ref 6.0–8.5)
eGFR: 80 mL/min/{1.73_m2} (ref >59)

## 2022-09-27 LAB — TSH: TSH: 2.12 u[IU]/mL (ref 0.450–4.500)

## 2022-09-30 ENCOUNTER — Encounter: Payer: Self-pay | Admitting: Family Medicine

## 2022-10-14 ENCOUNTER — Encounter: Payer: Self-pay | Admitting: Family Medicine

## 2022-10-24 ENCOUNTER — Other Ambulatory Visit: Payer: Self-pay | Admitting: Family Medicine

## 2022-10-24 NOTE — Telephone Encounter (Signed)
Unable to refill per protocol, Rx request is too soon. Last refill 09/26/22 for 30 and 3 refills.  Requested Prescriptions  Pending Prescriptions Disp Refills   busPIRone (BUSPAR) 7.5 MG tablet [Pharmacy Med Name: BUSPIRONE HCL 7.5 MG TABLET] 180 tablet 2    Sig: TAKE 1 TABLET BY MOUTH 2 TIMES DAILY.     Psychiatry: Anxiolytics/Hypnotics - Non-controlled Passed - 10/24/2022  9:31 AM      Passed - Valid encounter within last 12 months    Recent Outpatient Visits           4 weeks ago Routine general medical examination at a health care facility   [facility], Megan P, DO   6 months ago Cough, unspecified type   [place] [facility] [place], Megan P, DO   11 months ago Epigastric pain   [place] [facility] [place], [place] P, DO   1 year ago Routine general medical examination at a health care facility   [facility] [place], [place] P, DO   1 year ago Greater trochanteric pain syndrome of left lower extremity   [facility], Ocie Bob, MD       Future Appointments             In 4 days Dorcas Carrow, DO [facility] [facility], [facility]

## 2022-10-28 ENCOUNTER — Ambulatory Visit: Payer: Managed Care, Other (non HMO) | Admitting: Family Medicine

## 2022-11-04 MED ORDER — CITALOPRAM HYDROBROMIDE 40 MG PO TABS: 60.0000 mg | ORAL_TABLET | Freq: Every day | ORAL | 0 refills | Status: DC

## 2022-12-24 ENCOUNTER — Other Ambulatory Visit: Payer: Self-pay

## 2022-12-25 NOTE — Progress Notes (Unsigned)
Celso Amy, PA-C [address]  [address]  [place], [place] [address]  Main: [phone]  Fax: [phone]   Primary Care Physician: Dorcas Carrow, DO  Primary Gastroenterologist:  Dr. Midge Minium / Celso Amy, PA-C   CC: Abdominal pain  HPI: Kimberly Santiago is a 47 y.o. female presents for evaluation of chronic abdominal pain.  She last saw Dr. Servando Snare for chronic abdominal pain 05/2020.  Current symptoms: Abdominal pain, nausea, diarrhea, decreased appetite.  She has had increasing epigastric pain with nausea for the past few months.  Also reports upper abdominal cramping with episodes of diarrhea.  Reports increasing fatigue.  She denies NSAIDs use.  She is worried about stomach cancer.  She has tried Carafate which helped her epigastric burning.  Has endometriosis and followed up with her GYN a few months ago.  Currently takes pantoprazole 40 Mg every morning and Pepcid 20 Mg every evening.  Has a lot of burning in her upper abdomen and generalized upper abdominal pain.  Not currently taking any vitamins, B12, or iron.  Colonoscopy 07/2020 by Dr. Servando Snare showed small grade 1 nonbleeding internal hemorrhoids and no polyps.  Excellent prep.  Repeat screening colonoscopy in 10 years.  EGD 07/2020 by Dr. Servando Snare showed a small hiatal hernia, previous gastric bypass, healthy mucosa, no evidence of Barrett's.  No biopsies.  She went to [facility] ED to evaluate RLQ pain 08/2022.  Abdominal pelvic CT showed no acute abnormality.  Medical history significant for gastric bypass in 2016, cholecystectomy in 2011, history of GERD and IBS.  Takes pantoprazole 40 QAM, Pepcid 20 QPM, Carafate 1 g PRN, Bentyl 20 Mg as needed.    Current Outpatient Medications  Medication Sig Dispense Refill   acetaminophen (TYLENOL) 325 MG tablet Take 650 mg by mouth every 6 (six) hours as needed for mild pain or headache.     citalopram (CELEXA) 40 MG tablet Take 1.5 tablets (60 mg total) by mouth daily.  135 tablet 0   diclofenac Sodium (VOLTAREN) 1 % GEL Apply 4 g topically 4 (four) times daily. (Patient taking differently: Apply 4 g topically 4 (four) times daily as needed.) 100 g 12   famotidine (PEPCID) 20 MG tablet Take 1 tablet (20 mg total) by mouth at bedtime. (Patient taking differently: Take 20 mg by mouth at bedtime.) 90 tablet 3   fexofenadine (ALLEGRA) 180 MG tablet Take 180 mg by mouth daily.     JUNEL FE 1/20 1-20 MG-MCG tablet Take 1 tablet by mouth daily.     nystatin (MYCOSTATIN/NYSTOP) powder Apply 1 application topically 3 (three) times daily. (Patient taking differently: Apply 1 application  topically 3 (three) times daily as needed.) 15 g 3   pantoprazole (PROTONIX) 40 MG tablet TAKE 2 TABLETS BY MOUTH EVERY DAY 180 tablet 0   sucralfate (CARAFATE) 1 GM/10ML suspension Take 1 g by mouth 4 (four) times daily as needed.     No current facility-administered medications for this visit.    Allergies as of 12/26/2022 - Review Complete 09/26/2022  Allergen Reaction Noted   Hydromorphone Shortness Of Breath, Nausea And Vomiting, and Other (See Comments) 10/28/2018   Hydrocodone Nausea And Vomiting and Other (See Comments) 11/17/2014   Morphine and codeine Nausea And Vomiting and Other (See Comments) 04/27/2018   Nsaids Other (See Comments) 11/17/2014   Amoxicillin Itching 11/17/2014    Past Medical History:  Diagnosis Date   Anemia    Anxiety    Depression  Endometriosis    Gastritis, chronic    GERD (gastroesophageal reflux disease)    Hiatal hernia    SMALL   History of Barrett's esophagus    per pt last EGD 07-07-2020 no evidence noted   History of obstructive sleep apnea    per pt prior to gastric bypass 2016, stated retested after bypass and tested negative   History of panic attacks    History of thrombophlebitis 2005   per pt LLE superficial   Migraines    OAB (overactive bladder)    Ovarian cyst, right    PCOS (polycystic ovarian syndrome)    PONV  (postoperative nausea and vomiting)    S/P gastric bypass 09/08/2014   SUI (stress urinary incontinence, female)    Thyroid nodule    hx bx 02-09-2016  , benign,  in care everywhere    Past Surgical History:  Procedure Laterality Date   CESAREAN SECTION N/A 08/18/2017   Procedure: Primary CESAREAN SECTION;  Surgeon: Noland Fordyce, MD;  Location: [facility];  Service: Obstetrics;  Laterality: N/A;  EDD: 09/13/17 Allergy: Amoxicillin, Morphine, Hydrocodone   CESAREAN SECTION WITH BILATERAL TUBAL LIGATION Bilateral 07/27/2019   Procedure: Repeat CESAREAN SECTION WITH BILATERAL TUBAL LIGATION;  Surgeon: Noland Fordyce, MD;  Location: [facility] ORS;  Service: Obstetrics;  Laterality: Bilateral;  EDD: 07/31/19   COLONOSCOPY WITH PROPOFOL N/A 07/07/2020   Procedure: COLONOSCOPY WITH PROPOFOL;  Surgeon: Midge Minium, MD;  Location: [facility];  Service: Endoscopy;  Laterality: N/A;   DIAGNOSTIC LAPAROSCOPY  2006   for endometriosis   EAR CYST EXCISION N/A 11/24/2014   Procedure: CYST REMOVAL;  Surgeon: Bud Face, MD;  Location: [facility] ORS;  Service: ENT;  Laterality: N/A;  upper lip   ESOPHAGOGASTRODUODENOSCOPY (EGD) WITH PROPOFOL N/A 07/07/2020   Procedure: ESOPHAGOGASTRODUODENOSCOPY (EGD) WITH PROPOFOL;  Surgeon: Midge Minium, MD;  Location: [facility];  Service: Endoscopy;  Laterality: N/A;  sleep apnea   ETHMOIDECTOMY Right 05/07/2018   Procedure: ETHMOIDECTOMY;  Surgeon: Bud Face, MD;  Location: [facility] ORS;  Service: ENT;  Laterality: Right;   FRONTAL SINUS EXPLORATION Right 05/07/2018   Procedure: FRONTAL SINUS EXPLORATION;  Surgeon: Bud Face, MD;  Location: [facility] ORS;  Service: ENT;  Laterality: Right;   IMAGE GUIDED SINUS SURGERY N/A 05/07/2018   Procedure: IMAGE GUIDED SINUS SURGERY;  Surgeon: Bud Face, MD;  Location: [facility] ORS;  Service: ENT;  Laterality: N/A;   KNEE ARTHROSCOPY Right 2006   LAPAROSCOPIC CHOLECYSTECTOMY  2011    LAPAROSCOPIC GASTRIC BYPASS  08/30/2014   @[facility] ;   AND ROUX-EN-Y GASTROENTEROSTOMY   LAPAROSCOPIC SALPINGO OOPHERECTOMY Right 11/20/2021   Procedure: LAPAROSCOPIC SALPINGO OOPHORECTOMY;  Surgeon: Noland Fordyce, MD;  Location: [facility];  Service: Gynecology;  Laterality: Right;   MAXILLARY ANTROSTOMY Bilateral 05/07/2018   Procedure: MAXILLARY ANTROSTOMY;  Surgeon: Bud Face, MD;  Location: [facility] ORS;  Service: ENT;  Laterality: Bilateral;   NASAL SEPTOPLASTY W/ TURBINOPLASTY Bilateral 05/07/2018   Procedure: NASAL SEPTOPLASTY WITH TURBINATE REDUCTION;  Surgeon: Bud Face, MD;  Location: [facility] ORS;  Service: ENT;  Laterality: Bilateral;   SPHENOIDECTOMY Right 05/07/2018   Procedure: Selina Cooley;  Surgeon: Bud Face, MD;  Location: [facility] ORS;  Service: ENT;  Laterality: Right;   TEMPOROMANDIBULAR JOINT SURGERY Right 2003   WISDOM TOOTH EXTRACTION      Review of Systems:    All systems reviewed and negative except where noted in HPI.   Physical Examination:   There were no vitals taken for this visit.  General: Well-nourished, obese, well-developed in no acute distress.  Eyes: No icterus. Conjunctivae pink. Mouth: Oropharyngeal mucosa moist and pink , no lesions erythema or exudate. Lungs: Clear to auscultation bilaterally. Non-labored. Heart: Regular rate and rhythm, no murmurs rubs or gallops.  Abdomen: Bowel sounds are normal; Abdomen is Soft; No hepatosplenomegaly, masses or hernias; there is mild generalized tenderness throughout the entire upper and lower abdomen bilaterally.  Abdomen is very obese; No guarding or rebound tenderness. Extremities: No lower extremity edema. No clubbing or deformities. Neuro: Alert and oriented x 3.  Grossly intact. Skin: Warm and dry, no jaundice.   Psych: Alert and cooperative, anxious mood and affect.   Imaging Studies: No results found.  Assessment and Plan:   NYANA HAREN is a 47 y.o. y/o female  presents for:  1.  Epigastric pain 2.  Nausea 3.  History of gastric bypass 4.  Irritable bowel syndrome -diarrhea alternating with constipation 5.  Anemia 6.  Fatigue  Plan: 1.  Labs: CBC, CMP, lipase, TSH, iron, B12, alpha gal.  2.  Scheduling EGD I discussed risks of EGD with patient to include risk of bleeding, perforation, and risk of sedation.  Patient expressed understanding and agrees to proceed with EGD.   3.  Continue pantoprazole 40 Mg every morning, famotidine every afternoon.  Take Carafate 1 g twice daily before lunch and dinner.  Avoid NSAIDs, spicy and acidic foods.   Celso Amy, PA-C  Follow up in 4 weeks after EGD with TG.

## 2022-12-26 ENCOUNTER — Ambulatory Visit: Payer: Managed Care, Other (non HMO) | Admitting: Physician Assistant

## 2022-12-26 ENCOUNTER — Encounter: Payer: Self-pay | Admitting: Physician Assistant

## 2022-12-26 VITALS — BP 120/87 | HR 81 | Temp 98.2°F | Ht 68.0 in | Wt 309.0 lb

## 2022-12-26 DIAGNOSIS — R110 Nausea: Secondary | ICD-10-CM | POA: Diagnosis not present

## 2022-12-26 DIAGNOSIS — Z9884 Bariatric surgery status: Secondary | ICD-10-CM

## 2022-12-26 DIAGNOSIS — K582 Mixed irritable bowel syndrome: Secondary | ICD-10-CM | POA: Diagnosis not present

## 2022-12-26 DIAGNOSIS — R1013 Epigastric pain: Secondary | ICD-10-CM

## 2022-12-26 DIAGNOSIS — D649 Anemia, unspecified: Secondary | ICD-10-CM

## 2022-12-26 DIAGNOSIS — R5383 Other fatigue: Secondary | ICD-10-CM

## 2022-12-26 NOTE — Patient Instructions (Addendum)
Nice to meet you today. Continue pantoprazole 40 Mg every morning and famotidine every evening. Take Carafate 1 g 1 tablet twice daily every day before lunch and dinner. We will schedule EGD and get lab work today.

## 2022-12-27 ENCOUNTER — Other Ambulatory Visit: Payer: Self-pay | Admitting: Family Medicine

## 2022-12-28 LAB — COMPREHENSIVE METABOLIC PANEL
AST: 13 IU/L (ref 0–40)
Alkaline Phosphatase: 60 IU/L (ref 44–121)
Bilirubin Total: 0.2 mg/dL (ref 0.0–1.2)
CO2: 24 mmol/L (ref 20–29)
Chloride: 104 mmol/L (ref 96–106)
Globulin, Total: 2.5 g/dL (ref 1.5–4.5)
Glucose: 99 mg/dL (ref 70–99)

## 2022-12-28 LAB — ALPHA-GAL PANEL

## 2022-12-28 LAB — IRON,TIBC AND FERRITIN PANEL
Iron: 27 ug/dL (ref 27–159)
UIBC: 477 ug/dL — ABNORMAL HIGH (ref 131–425)

## 2022-12-30 ENCOUNTER — Encounter: Payer: Self-pay | Admitting: Physician Assistant

## 2022-12-30 ENCOUNTER — Telehealth: Payer: Self-pay

## 2022-12-30 NOTE — Telephone Encounter (Signed)
Patient notified. Wanted to know if gummies would be ok and was told yes.

## 2022-12-30 NOTE — Progress Notes (Signed)
Notify patient labs show: 1.) Low iron, low vitamin B12, and low hemoglobin.  Labs are consistent with iron deficiency and B12 deficiency anemia.  Please start OTC vitamin B12, take 1000 mcg daily.  Also start OTC iron ferrous sulfate 325 mg 1 tablet once daily.  Continue with plan for EGD as scheduled. 2.) Low calcium.  Recommend take OTC calcium 1200 mg and vitamin D 400 units daily. 3.)  Kidney and liver test are normal. 4.)  Thyroid test, lipase pancreas test, and folate are normal. 5.)  Alpha gal test still pending. Celso Amy, PA-C

## 2022-12-30 NOTE — Telephone Encounter (Signed)
Last RF 11/04/22 #135 too soon  Requested Prescriptions  Refused Prescriptions Disp Refills   citalopram (CELEXA) 40 MG tablet [Pharmacy Med Name: CITALOPRAM HBR 40 MG TABLET] 90 tablet     Sig: TAKE 1 TABLET BY MOUTH EVERY DAY     Psychiatry:  Antidepressants - SSRI Passed - 12/27/2022  2:30 AM      Passed - Completed PHQ-2 or PHQ-9 in the last 360 days      Passed - Valid encounter within last 6 months    Recent Outpatient Visits           3 months ago Routine general medical examination at a health care facility   [facility], Megan P, DO   8 months ago Cough, unspecified type   [facility] [place], Megan P, DO   1 year ago Epigastric pain   [facility] [place], [place] P, DO   1 year ago Routine general medical examination at a health care facility   [facility] [place], [place] P, DO   1 year ago Greater trochanteric pain syndrome of left lower extremity   [facility], Ocie Bob, MD

## 2022-12-30 NOTE — Telephone Encounter (Signed)
Left message to call office.   1.) Low iron, low vitamin B12, and low hemoglobin.  Labs are consistent with iron deficiency and B12 deficiency anemia.  Please start OTC vitamin B12, take 1000 mcg daily.  Also start OTC iron ferrous sulfate 325 mg 1 tablet once daily.  Continue with plan for EGD as scheduled.  2.) Low calcium.  Recommend take OTC calcium 1200 mg and vitamin D 400 units daily.  3.)  Kidney and liver test are normal.  4.)  Thyroid test, lipase pancreas test, and folate are normal.  5.)  Alpha gal test still pending.  Celso Amy, PA-C

## 2022-12-31 LAB — CBC WITH DIFFERENTIAL
Basophils Absolute: 0.1 10*3/uL (ref 0.0–0.2)
Basos: 2 %
EOS (ABSOLUTE): 0.2 10*3/uL (ref 0.0–0.4)
Eos: 4 %
Hematocrit: 34.8 % (ref 34.0–46.6)
Hemoglobin: 10.9 g/dL — ABNORMAL LOW (ref 11.1–15.9)
Immature Grans (Abs): 0 10*3/uL (ref 0.0–0.1)
Immature Granulocytes: 0 %
Lymphocytes Absolute: 1.7 10*3/uL (ref 0.7–3.1)
Lymphs: 36 %
MCH: 24.7 pg — ABNORMAL LOW (ref 26.6–33.0)
MCHC: 31.3 g/dL — ABNORMAL LOW (ref 31.5–35.7)
MCV: 79 fL (ref 79–97)
Monocytes Absolute: 0.4 10*3/uL (ref 0.1–0.9)
Monocytes: 8 %
Neutrophils Absolute: 2.3 10*3/uL (ref 1.4–7.0)
Neutrophils: 50 %
RBC: 4.42 x10E6/uL (ref 3.77–5.28)
RDW: 13.8 % (ref 11.7–15.4)
WBC: 4.8 10*3/uL (ref 3.4–10.8)

## 2022-12-31 LAB — B12 AND FOLATE PANEL
Folate: 5 ng/mL (ref >3.0)
Vitamin B-12: 120 pg/mL — ABNORMAL LOW (ref 232–1245)

## 2022-12-31 LAB — ALPHA-GAL PANEL
Beef IgE: <0.1 kU/L
O215-IgE Alpha-Gal: <0.1 kU/L

## 2022-12-31 LAB — COMPREHENSIVE METABOLIC PANEL
ALT: 9 IU/L (ref 0–32)
Albumin: 3.6 g/dL — ABNORMAL LOW (ref 3.9–4.9)
BUN/Creatinine Ratio: 12 (ref 9–23)
BUN: 10 mg/dL (ref 6–24)
Calcium: 8.3 mg/dL — ABNORMAL LOW (ref 8.7–10.2)
Creatinine, Ser: 0.82 mg/dL (ref 0.57–1.00)
Potassium: 4.3 mmol/L (ref 3.5–5.2)
Sodium: 140 mmol/L (ref 134–144)
Total Protein: 6.1 g/dL (ref 6.0–8.5)
eGFR: 89 mL/min/{1.73_m2} (ref >59)

## 2022-12-31 LAB — TSH: TSH: 1.7 u[IU]/mL (ref 0.450–4.500)

## 2022-12-31 LAB — IRON,TIBC AND FERRITIN PANEL
Ferritin: 5 ng/mL — ABNORMAL LOW (ref 15–150)
Iron Saturation: 5 % — CL (ref 15–55)
Total Iron Binding Capacity: 504 ug/dL — ABNORMAL HIGH (ref 250–450)

## 2022-12-31 LAB — LIPASE: Lipase: 34 U/L (ref 14–72)

## 2023-01-13 ENCOUNTER — Telehealth: Payer: Self-pay

## 2023-01-13 MED ORDER — TRULANCE 3 MG PO TABS: 3.0000 mg | ORAL_TABLET | Freq: Every day | ORAL | 5 refills | Status: DC

## 2023-01-13 NOTE — Telephone Encounter (Signed)
Spoke with patient -she would like the below medication called in to her pharmacy.   Trulance 3 Mg 1 tablet once daily is used to treat constipation.  Please verify that Trulance is the medication that patient needs.  Okay to send prescription to her pharmacy if that is what she needs, #30 with 5 refills.

## 2023-01-18 ENCOUNTER — Other Ambulatory Visit: Payer: Self-pay | Admitting: Family Medicine

## 2023-01-20 NOTE — Telephone Encounter (Signed)
Requested Prescriptions  Pending Prescriptions Disp Refills   pantoprazole (PROTONIX) 40 MG tablet [Pharmacy Med Name: PANTOPRAZOLE SOD DR 40 MG TAB] 180 tablet 0    Sig: TAKE 2 TABLETS BY MOUTH EVERY DAY     Gastroenterology: Proton Pump Inhibitors Passed - 01/18/2023  1:09 AM      Passed - Valid encounter within last 12 months    Recent Outpatient Visits           3 months ago Routine general medical examination at a health care facility   [facility], Megan P, DO   9 months ago Cough, unspecified type   [facility] [place], [place] P, DO   1 year ago Epigastric pain   [place] [place], [place] P, DO   1 year ago Routine general medical examination at a health care facility   [facility] [place], [place] P, DO   1 year ago Greater trochanteric pain syndrome of left lower extremity   [facility], Ocie Bob, MD

## 2023-02-20 ENCOUNTER — Ambulatory Visit: Payer: Managed Care, Other (non HMO) | Admitting: Certified Registered"

## 2023-02-20 ENCOUNTER — Encounter: Payer: Self-pay | Admitting: Gastroenterology

## 2023-02-20 ENCOUNTER — Encounter
Admission: RE | Disposition: A | Discharge status: Home / Self Care | Payer: Self-pay | Source: Home / Self Care | Attending: Gastroenterology

## 2023-02-20 ENCOUNTER — Ambulatory Visit
Admission: RE | Admit: 2023-02-20 | Discharge: 2023-02-20 | Disposition: A | Discharge status: Home / Self Care | Payer: Managed Care, Other (non HMO) | Attending: Gastroenterology | Admitting: Gastroenterology

## 2023-02-20 ENCOUNTER — Other Ambulatory Visit: Payer: Self-pay

## 2023-02-20 DIAGNOSIS — K317 Polyp of stomach and duodenum: Secondary | ICD-10-CM | POA: Diagnosis not present

## 2023-02-20 DIAGNOSIS — Z9884 Bariatric surgery status: Secondary | ICD-10-CM | POA: Diagnosis not present

## 2023-02-20 DIAGNOSIS — K219 Gastro-esophageal reflux disease without esophagitis: Secondary | ICD-10-CM | POA: Diagnosis not present

## 2023-02-20 DIAGNOSIS — R1013 Epigastric pain: Secondary | ICD-10-CM | POA: Insufficient documentation

## 2023-02-20 DIAGNOSIS — G4733 Obstructive sleep apnea (adult) (pediatric): Secondary | ICD-10-CM | POA: Diagnosis not present

## 2023-02-20 HISTORY — DX: Obesity, unspecified: E66.9

## 2023-02-20 HISTORY — PX: BIOPSY: SHX5522

## 2023-02-20 HISTORY — DX: Polyp of stomach and duodenum: K31.7

## 2023-02-20 HISTORY — PX: ESOPHAGOGASTRODUODENOSCOPY (EGD) WITH PROPOFOL: SHX5813

## 2023-02-20 LAB — POCT PREGNANCY, URINE: Preg Test, Ur: NEGATIVE

## 2023-02-20 SURGERY — ESOPHAGOGASTRODUODENOSCOPY (EGD) WITH PROPOFOL
Anesthesia: General

## 2023-02-20 MED ORDER — PROPOFOL 10 MG/ML IV BOLUS
INTRAVENOUS | Status: DC | PRN
  Administered 2023-02-20 (×6): 30 mg via INTRAVENOUS
  Administered 2023-02-20: 20 mg via INTRAVENOUS

## 2023-02-20 MED ORDER — LIDOCAINE HCL (PF) 2 % IJ SOLN
INTRAMUSCULAR | Status: DC | PRN
  Administered 2023-02-20: 40 mg via INTRADERMAL

## 2023-02-20 MED ORDER — PROPOFOL 1000 MG/100ML IV EMUL
INTRAVENOUS | Status: AC
  Filled 2023-02-20: qty 100

## 2023-02-20 MED ORDER — IPRATROPIUM-ALBUTEROL 0.5-2.5 (3) MG/3ML IN SOLN
3.0000 mL | Freq: Once | RESPIRATORY_TRACT | Status: AC
  Administered 2023-02-20: 3 mL via RESPIRATORY_TRACT

## 2023-02-20 MED ORDER — IPRATROPIUM-ALBUTEROL 0.5-2.5 (3) MG/3ML IN SOLN
RESPIRATORY_TRACT | Status: AC
  Filled 2023-02-20: qty 3

## 2023-02-20 MED ORDER — ALBUTEROL SULFATE (2.5 MG/3ML) 0.083% IN NEBU: 2.5000 mg | INHALATION_SOLUTION | Freq: Once | RESPIRATORY_TRACT | Status: DC

## 2023-02-20 MED ORDER — SODIUM CHLORIDE 0.9 % IV SOLN: INTRAVENOUS | Status: DC

## 2023-02-20 MED ORDER — LIDOCAINE HCL (PF) 2 % IJ SOLN
INTRAMUSCULAR | Status: AC
  Filled 2023-02-20: qty 5

## 2023-02-20 NOTE — Transfer of Care (Signed)
Immediate Anesthesia Transfer of Care Note  Patient: Kimberly Santiago  Procedure(s) Performed: ESOPHAGOGASTRODUODENOSCOPY (EGD) WITH PROPOFOL BIOPSY  Patient Location: PACU and Endoscopy Unit  Anesthesia Type:MAC  Level of Consciousness: drowsy  Airway & Oxygen Therapy: Patient Spontanous Breathing and Patient connected to face mask oxygen  Post-op Assessment: Report given to RN and Post -op Vital signs reviewed and stable  Post vital signs: Reviewed and stable  Last Vitals:  Vitals Value Taken Time  BP 99/87 02/20/23 0807  Temp 36.2 C 02/20/23 0807  Pulse 92 02/20/23 0807  Resp    SpO2 95 % 02/20/23 0807    Last Pain:  Vitals:   02/20/23 0807  TempSrc: Temporal  PainSc: Asleep         Complications: No notable events documented.

## 2023-02-20 NOTE — Anesthesia Preprocedure Evaluation (Signed)
Anesthesia Evaluation  Patient identified by MRN, date of birth, ID band Patient awake    Reviewed: Allergy & Precautions, NPO status , Patient's Chart, lab work & pertinent test results  History of Anesthesia Complications (+) PONV and history of anesthetic complications  Airway Mallampati: II  TM Distance: >3 FB Neck ROM: Full  Mouth opening: Limited Mouth Opening  Dental no notable dental hx.    Pulmonary neg shortness of breath, asthma , sleep apnea (denies since gastric bypass) , neg recent URI   breath sounds clear to auscultation- rhonchi (-) wheezing      Cardiovascular Exercise Tolerance: Good (-) hypertension(-) angina (-) CAD, (-) Past MI, (-) Cardiac Stents and (-) CABG negative cardio ROS (-) dysrhythmias  Rhythm:Regular Rate:Normal - Systolic murmurs and - Diastolic murmurs    Neuro/Psych  Headaches PSYCHIATRIC DISORDERS Anxiety Depression       GI/Hepatic Neg liver ROS, hiatal hernia,GERD  ,,  Endo/Other  neg diabetes  Morbid obesity  Renal/GU negative Renal ROS     Musculoskeletal negative musculoskeletal ROS (+)    Abdominal  (+) + obese  Peds  Hematology  (+) Blood dyscrasia, anemia   Anesthesia Other Findings Past Medical History: No date: Anemia No date: Asthma No date: Barrett esophagus No date: Complication of anesthesia     Comment:  Pt stopped breathing during EGD but did well with recent              Gastric Bypass Surgery No date: Depression No date: GERD (gastroesophageal reflux disease)     Comment:  H/O No date: Headache     Comment:  H/O MIGRAINES No date: History of hiatal hernia     Comment:  SMALL No date: PONV (postoperative nausea and vomiting) No date: Sleep apnea     Comment:  PT IS NOT USING CPAP   Reproductive/Obstetrics negative OB ROS                             Anesthesia Physical Anesthesia Plan  ASA: 3  Anesthesia Plan: General    Post-op Pain Management:    Induction: Intravenous  PONV Risk Score and Plan: 4 or greater and Propofol infusion and TIVA  Airway Management Planned: Natural Airway and Nasal Cannula  Additional Equipment:   Intra-op Plan:   Post-operative Plan:   Informed Consent: I have reviewed the patients History and Physical, chart, labs and discussed the procedure including the risks, benefits and alternatives for the proposed anesthesia with the patient or authorized representative who has indicated his/her understanding and acceptance.     Dental advisory given  Plan Discussed with: CRNA and Anesthesiologist  Anesthesia Plan Comments:         Anesthesia Quick Evaluation

## 2023-02-20 NOTE — H&P (Signed)
Midge Minium, MD [facility] [address], [address] [place], [place] [address] [phone] Fax : [phone]  Primary Care Physician:  Dorcas Carrow, DO Primary Gastroenterologist:  Dr. Servando Snare  Pre-Procedure History & Physical: HPI:  Kimberly Santiago is a 47 y.o. female is here for an endoscopy.   Past Medical History:  Diagnosis Date   Anemia    Anxiety    Depression    Endometriosis    Gastritis, chronic    GERD (gastroesophageal reflux disease)    Hiatal hernia    SMALL   History of Barrett's esophagus    per pt last EGD 07-07-2020 no evidence noted   History of obstructive sleep apnea    per pt prior to gastric bypass 2016, stated retested after bypass and tested negative   History of panic attacks    History of thrombophlebitis 2005   per pt LLE superficial   Migraines    OAB (overactive bladder)    Obese    Ovarian cyst, right    PCOS (polycystic ovarian syndrome)    PONV (postoperative nausea and vomiting)    S/P gastric bypass 09/08/2014   SUI (stress urinary incontinence, female)    Thyroid nodule    hx bx 02-09-2016  , benign,  in care everywhere    Past Surgical History:  Procedure Laterality Date   CESAREAN SECTION N/A 08/18/2017   Procedure: Primary CESAREAN SECTION;  Surgeon: Noland Fordyce, MD;  Location: [facility];  Service: Obstetrics;  Laterality: N/A;  EDD: 09/13/17 Allergy: Amoxicillin, Morphine, Hydrocodone   CESAREAN SECTION WITH BILATERAL TUBAL LIGATION Bilateral 07/27/2019   Procedure: Repeat CESAREAN SECTION WITH BILATERAL TUBAL LIGATION;  Surgeon: Noland Fordyce, MD;  Location: [facility];  Service: Obstetrics;  Laterality: Bilateral;  EDD: 07/31/19   COLONOSCOPY WITH PROPOFOL N/A 07/07/2020   Procedure: COLONOSCOPY WITH PROPOFOL;  Surgeon: Midge Minium, MD;  Location: [facility];  Service: Endoscopy;  Laterality: N/A;   DIAGNOSTIC LAPAROSCOPY  2006   for endometriosis   EAR CYST EXCISION N/A 11/24/2014   Procedure:  CYST REMOVAL;  Surgeon: Bud Face, MD;  Location: [facility] ORS;  Service: ENT;  Laterality: N/A;  upper lip   ESOPHAGOGASTRODUODENOSCOPY (EGD) WITH PROPOFOL N/A 07/07/2020   Procedure: ESOPHAGOGASTRODUODENOSCOPY (EGD) WITH PROPOFOL;  Surgeon: Midge Minium, MD;  Location: [facility];  Service: Endoscopy;  Laterality: N/A;  sleep apnea   ETHMOIDECTOMY Right 05/07/2018   Procedure: ETHMOIDECTOMY;  Surgeon: Bud Face, MD;  Location: [facility] ORS;  Service: ENT;  Laterality: Right;   FRONTAL SINUS EXPLORATION Right 05/07/2018   Procedure: FRONTAL SINUS EXPLORATION;  Surgeon: Bud Face, MD;  Location: [facility] ORS;  Service: ENT;  Laterality: Right;   IMAGE GUIDED SINUS SURGERY N/A 05/07/2018   Procedure: IMAGE GUIDED SINUS SURGERY;  Surgeon: Bud Face, MD;  Location: [facility] ORS;  Service: ENT;  Laterality: N/A;   KNEE ARTHROSCOPY Right 2006   LAPAROSCOPIC CHOLECYSTECTOMY  2011   LAPAROSCOPIC GASTRIC BYPASS  08/30/2014   @[facility] ;   AND ROUX-EN-Y GASTROENTEROSTOMY   LAPAROSCOPIC SALPINGO OOPHERECTOMY Right 11/20/2021   Procedure: LAPAROSCOPIC SALPINGO OOPHORECTOMY;  Surgeon: Noland Fordyce, MD;  Location: [facility];  Service: Gynecology;  Laterality: Right;   MAXILLARY ANTROSTOMY Bilateral 05/07/2018   Procedure: MAXILLARY ANTROSTOMY;  Surgeon: Bud Face, MD;  Location: [facility] ORS;  Service: ENT;  Laterality: Bilateral;   NASAL SEPTOPLASTY W/ TURBINOPLASTY Bilateral 05/07/2018   Procedure: NASAL SEPTOPLASTY WITH TURBINATE REDUCTION;  Surgeon: Bud Face, MD;  Location: [facility] ORS;  Service: ENT;  Laterality: Bilateral;   SPHENOIDECTOMY Right 05/07/2018   Procedure: SPHENOIDECTOMY;  Surgeon: Bud Face, MD;  Location: [facility] ORS;  Service: ENT;  Laterality: Right;   TEMPOROMANDIBULAR JOINT SURGERY Right 2003   WISDOM TOOTH EXTRACTION      Prior to Admission medications   Medication Sig Start Date End Date Taking? Authorizing Provider   citalopram (CELEXA) 40 MG tablet Take 1.5 tablets (60 mg total) by mouth daily. 11/04/22  Yes Johnson, Megan P, DO  diclofenac Sodium (VOLTAREN) 1 % GEL Apply 4 g topically 4 (four) times daily. Patient taking differently: Apply 4 g topically 4 (four) times daily as needed. 05/29/21  Yes Johnson, Megan P, DO  Plecanatide (TRULANCE) 3 MG TABS Take 1 tablet (3 mg total) by mouth daily. 01/13/23  Yes Celso Amy, PA-C  acetaminophen (TYLENOL) 325 MG tablet Take 650 mg by mouth every 6 (six) hours as needed for mild pain or headache.    [provider]  famotidine (PEPCID) 20 MG tablet Take 1 tablet (20 mg total) by mouth at bedtime. Patient taking differently: Take 20 mg by mouth at bedtime. 09/24/21   Johnson, Megan P, DO  fexofenadine (ALLEGRA) 180 MG tablet Take 180 mg by mouth daily.    [provider]  JUNEL FE 1/20 1-20 MG-MCG tablet Take 1 tablet by mouth daily. 04/08/22   [provider]  nystatin (MYCOSTATIN/NYSTOP) powder Apply 1 application topically 3 (three) times daily. Patient taking differently: Apply 1 application  topically 3 (three) times daily as needed. 09/18/20   Johnson, Megan P, DO  pantoprazole (PROTONIX) 40 MG tablet TAKE 2 TABLETS BY MOUTH EVERY DAY 01/20/23   Johnson, Megan P, DO  sucralfate (CARAFATE) 1 GM/10ML suspension Take 1 g by mouth 4 (four) times daily as needed. 10/22/21   [provider]  fluticasone (FLONASE) 50 MCG/ACT nasal spray Place 2 sprays into both nostrils at bedtime.  05/15/20  [provider]    Allergies as of 12/26/2022 - Review Complete 12/26/2022  Allergen Reaction Noted   Hydromorphone Shortness Of Breath, Nausea And Vomiting, and Other (See Comments) 10/28/2018   Hydrocodone Nausea And Vomiting and Other (See Comments) 11/17/2014   Morphine and codeine Nausea And Vomiting and Other (See Comments) 04/27/2018   Nsaids Other (See Comments) 11/17/2014   Amoxicillin Itching 11/17/2014    Family History   Problem Relation Age of Onset   Breast cancer Paternal Aunt        pat great aunt   Heart disease Maternal Grandmother    Heart disease Maternal Grandfather    Heart disease Paternal Grandmother    Heart disease Paternal Grandfather    Anxiety disorder Mother    Depression Mother    Miscarriages / [place] Sister     Social History   Socioeconomic History   Marital status: Married    Spouse name: Josy Hamed   Number of children: 2   Years of education: Not on file   Highest education level: Not on file  Occupational History   Not on file  Tobacco Use   Smoking status: Never   Smokeless tobacco: Never  Vaping Use   Vaping status: Never Used  Substance and Sexual Activity   Alcohol use: Not Currently   Drug use: Never   Sexual activity: Yes    Partners: Male    Birth control/protection: Surgical  Other Topics Concern   Not on file  Social History Narrative   Not on file   Social  Determinants of Health   Financial Resource Strain: Low Risk  (03/14/2020)   [facility], [facility]   Overall Financial Resource Strain (CARDIA)    Difficulty of Paying Living Expenses: Not hard at all  Food Insecurity: No Food Insecurity (03/14/2020)   [facility] from [facility], [facility]   Hunger Vital Sign    Worried About Running Out of Food in the Last Year: Never true    Ran Out of Food in the Last Year: Never true  Transportation Needs: No Transportation Needs (03/14/2020)   [facility] from [facility], [facility]   [facility] - Transportation    Lack of Transportation (Medical): No    Lack of Transportation (Non-Medical): No  Physical Activity: Not on file  Stress: Not on file  Social Connections: Not on file  Intimate Partner Violence: Not on file    Review of Systems: See HPI, otherwise negative ROS  Physical Exam: There were no vitals taken for this visit. General:   Alert,  pleasant and cooperative in NAD Head:   Normocephalic and atraumatic. Neck:  Supple; no masses or thyromegaly. Lungs:  Clear throughout to auscultation.    Heart:  Regular rate and rhythm. Abdomen:  Soft, nontender and nondistended. Normal bowel sounds, without guarding, and without rebound.   Neurologic:  Alert and  oriented x4;  grossly normal neurologically.  Impression/Plan: Kimberly Santiago is here for an endoscopy to be performed for epigastric pain  Risks, benefits, limitations, and alternatives regarding  endoscopy have been reviewed with the patient.  Questions have been answered.  All parties agreeable.   Midge Minium, MD  02/20/2023, 7:39 AM

## 2023-02-20 NOTE — Op Note (Signed)
[facility] Gastroenterology Patient Name: Kimberly Santiago Procedure Date: 02/20/2023 6:53 AM MRN: [id number] Account #: [id number] Date of Birth: May 15, 1976 Admit Type: Outpatient Age: 47 Room: [facility] ENDO ROOM 4 Gender: Female Note Status: Finalized Instrument Name: Upper Endoscope [id number] Procedure:             Upper GI endoscopy Indications:           Epigastric abdominal pain Providers:             Midge Minium MD, MD Referring MD:          Dorcas Carrow (Referring MD) Medicines:             Propofol per Anesthesia Complications:         No immediate complications. Procedure:             Pre-Anesthesia Assessment:                        - Prior to the procedure, a History and Physical was                         performed, and patient medications and allergies were                         reviewed. The patient's tolerance of previous                         anesthesia was also reviewed. The risks and benefits                         of the procedure and the sedation options and risks                         were discussed with the patient. All questions were                         answered, and informed consent was obtained. Prior                         Anticoagulants: The patient has taken no anticoagulant                         or antiplatelet agents. ASA Grade Assessment: II - A                         patient with mild systemic disease. After reviewing                         the risks and benefits, the patient was deemed in                         satisfactory condition to undergo the procedure.                        After obtaining informed consent, the endoscope was                         passed under direct vision. Throughout the procedure,  the patient's blood pressure, pulse, and oxygen                         saturations were monitored continuously. The Endoscope                         was introduced through the mouth,  and advanced to the                         jejunum. The upper GI endoscopy was accomplished                         without difficulty. The patient tolerated the                         procedure well. Findings:      The examined esophagus was normal.      A single 6 mm sessile polyp with no stigmata of recent bleeding was       found in the gastric fundus. The polyp was removed with a cold biopsy       forceps. Resection and retrieval were complete.      Evidence of a gastric bypass was found. A gastric pouch with a small       size was found. The gastrojejunal anastomosis was characterized by       healthy appearing mucosa. This was traversed.      The examined jejunum was normal. Impression:            - Normal esophagus.                        - A single gastric polyp. Resected and retrieved.                        - Gastric bypass with a small-sized pouch.                         Gastrojejunal anastomosis characterized by healthy                         appearing mucosa.                        - Normal examined jejunum. Recommendation:        - Discharge patient to home.                        - Resume previous diet.                        - Continue present medications.                        - Await pathology results. Procedure Code(s):     --- Professional ---                        [phone], Esophagogastroduodenoscopy, flexible,                         transoral; with biopsy, single or multiple Diagnosis Code(s):     --- Professional ---  R10.13, Epigastric pain                        K31.7, Polyp of stomach and duodenum CPT copyright 2022 American Medical Association. All rights reserved. The codes documented in this report are preliminary and upon coder review may  be revised to meet current compliance requirements. Midge Minium MD, MD 02/20/2023 8:08:48 AM This report has been signed electronically. Number of Addenda: 0 Note Initiated On: 02/20/2023  6:53 AM Estimated Blood Loss:  Estimated blood loss: none.      [facility]

## 2023-02-21 ENCOUNTER — Encounter: Payer: Self-pay | Admitting: Gastroenterology

## 2023-02-24 ENCOUNTER — Encounter: Payer: Self-pay | Admitting: Gastroenterology

## 2023-02-26 NOTE — Anesthesia Postprocedure Evaluation (Signed)
Anesthesia Post Note  Patient: Kimberly Santiago  Procedure(s) Performed: ESOPHAGOGASTRODUODENOSCOPY (EGD) WITH PROPOFOL BIOPSY  Patient location during evaluation: Endoscopy Anesthesia Type: General Level of consciousness: awake and alert Pain management: pain level controlled Vital Signs Assessment: post-procedure vital signs reviewed and stable Respiratory status: spontaneous breathing, nonlabored ventilation, respiratory function stable and patient connected to nasal cannula oxygen Cardiovascular status: blood pressure returned to baseline and stable Postop Assessment: no apparent nausea or vomiting Anesthetic complications: no   No notable events documented.   Last Vitals:  Vitals:   02/20/23 0807 02/20/23 0817  BP: 99/87 117/62  Pulse: 92 74  Resp: 15 12  Temp: (!) 36.2 C   SpO2: 95% 99%    Last Pain:  Vitals:   02/20/23 0807  TempSrc: Temporal  PainSc: Asleep                 Lenard Simmer

## 2023-02-27 ENCOUNTER — Encounter: Payer: Self-pay | Admitting: Gastroenterology

## 2023-03-20 ENCOUNTER — Ambulatory Visit: Payer: Managed Care, Other (non HMO) | Admitting: Physician Assistant

## 2023-04-16 ENCOUNTER — Ambulatory Visit: Payer: Managed Care, Other (non HMO) | Admitting: Gastroenterology

## 2023-04-16 ENCOUNTER — Other Ambulatory Visit: Payer: Self-pay | Admitting: Family Medicine

## 2023-04-16 DIAGNOSIS — Z1231 Encounter for screening mammogram for malignant neoplasm of breast: Secondary | ICD-10-CM

## 2023-04-16 NOTE — Telephone Encounter (Signed)
Requested Prescriptions  Pending Prescriptions Disp Refills   citalopram (CELEXA) 40 MG tablet [Pharmacy Med Name: CITALOPRAM HBR 40 MG TABLET] 90 tablet 0    Sig: TAKE 1 TABLET BY MOUTH EVERY DAY     Psychiatry:  Antidepressants - SSRI Failed - 04/16/2023  9:17 AM      Failed - Valid encounter within last 6 months    Recent Outpatient Visits           6 months ago Routine general medical examination at a health care facility   [facility], Megan P, DO   11 months ago Cough, unspecified type   [place] [facility] [place], [place] P, DO   1 year ago Epigastric pain   [facility] [place], [place] P, DO   1 year ago Routine general medical examination at a health care facility   [facility] [place], [place] P, DO   1 year ago Greater trochanteric pain syndrome of left lower extremity   [place] [facility], Ocie Bob, MD              Passed - Completed PHQ-2 or PHQ-9 in the last 360 days

## 2023-05-07 ENCOUNTER — Ambulatory Visit
Admission: RE | Admit: 2023-05-07 | Discharge: 2023-05-07 | Disposition: A | Discharge status: Home / Self Care | Payer: Managed Care, Other (non HMO) | Source: Ambulatory Visit | Attending: Family Medicine | Admitting: Family Medicine

## 2023-05-07 DIAGNOSIS — Z1231 Encounter for screening mammogram for malignant neoplasm of breast: Secondary | ICD-10-CM | POA: Insufficient documentation

## 2023-06-02 ENCOUNTER — Other Ambulatory Visit: Payer: Self-pay | Admitting: Family Medicine

## 2023-06-05 NOTE — Telephone Encounter (Signed)
Requested Prescriptions  Pending Prescriptions Disp Refills   pantoprazole (PROTONIX) 40 MG tablet [Pharmacy Med Name: PANTOPRAZOLE SOD DR 40 MG TAB] 180 tablet 1    Sig: TAKE 2 TABLETS BY MOUTH EVERY DAY     Gastroenterology: Proton Pump Inhibitors Passed - 06/02/2023  2:34 PM      Passed - Valid encounter within last 12 months    Recent Outpatient Visits           8 months ago Routine general medical examination at a health care facility   [facility] [place], [place] P, DO   1 year ago Cough, unspecified type   [place] [facility] [place], Megan P, DO   1 year ago Epigastric pain   [place] [facility] [place], [place] P, DO   1 year ago Routine general medical examination at a health care facility   [facility] [place], [place] P, DO   1 year ago Greater trochanteric pain syndrome of left lower extremity   [place] [facility], Ocie Bob, MD

## 2023-07-20 ENCOUNTER — Other Ambulatory Visit: Payer: Self-pay | Admitting: Family Medicine

## 2023-07-21 NOTE — Telephone Encounter (Signed)
Requested medication (s) are due for refill today: Yes  Requested medication (s) are on the active medication list: Yes  Last refill:  04/16/23  Future visit scheduled: No  Notes to clinic:  Left message to call and make appointment.    Requested Prescriptions  Pending Prescriptions Disp Refills   citalopram (CELEXA) 40 MG tablet [Pharmacy Med Name: CITALOPRAM HBR 40 MG TABLET] 115 tablet 1    Sig: TAKE 1 & 1/2 TABLET BY MOUTH DAILY     Psychiatry:  Antidepressants - SSRI Failed - 07/21/2023 12:40 PM      Failed - Valid encounter within last 6 months    Recent Outpatient Visits           9 months ago Routine general medical examination at a health care facility   [facility] [place], [place] P, DO   1 year ago Cough, unspecified type   [place] [facility] [place], Megan P, DO   1 year ago Epigastric pain   [place] [facility] [place], [place] P, DO   1 year ago Routine general medical examination at a health care facility   [facility] [place], [place] P, DO   1 year ago Greater trochanteric pain syndrome of left lower extremity    [facility], Ocie Bob, MD              Passed - Completed PHQ-2 or PHQ-9 in the last 360 days

## 2023-07-24 NOTE — Telephone Encounter (Signed)
Patient is overdue for an appointment. Please call to schedule and then route to provider for refill.  

## 2023-07-28 NOTE — Telephone Encounter (Signed)
Patient has been scheduled for a follow up on 2/4

## 2023-08-05 ENCOUNTER — Ambulatory Visit: Payer: Managed Care, Other (non HMO) | Admitting: Family Medicine

## 2023-08-05 ENCOUNTER — Encounter: Payer: Self-pay | Admitting: Family Medicine

## 2023-08-05 VITALS — BP 120/71 | HR 85 | Wt 317.6 lb

## 2023-08-05 DIAGNOSIS — D509 Iron deficiency anemia, unspecified: Secondary | ICD-10-CM | POA: Diagnosis not present

## 2023-08-05 DIAGNOSIS — Z9884 Bariatric surgery status: Secondary | ICD-10-CM

## 2023-08-05 DIAGNOSIS — F331 Major depressive disorder, recurrent, moderate: Secondary | ICD-10-CM

## 2023-08-05 DIAGNOSIS — F419 Anxiety disorder, unspecified: Secondary | ICD-10-CM | POA: Diagnosis not present

## 2023-08-05 LAB — BAYER DCA HB A1C WAIVED: HB A1C (BAYER DCA - WAIVED): 5.3 % (ref 4.8–5.6)

## 2023-08-05 NOTE — Assessment & Plan Note (Signed)
Will check labs. Await results. Treat as needed.  

## 2023-08-05 NOTE — Assessment & Plan Note (Signed)
 Under good control on current regimen. Continue current regimen. Continue to monitor. Call with any concerns. Refills given. Labs drawn today.

## 2023-08-05 NOTE — Assessment & Plan Note (Signed)
Will recheck labs. Await results. Treat as needed.

## 2023-08-05 NOTE — Progress Notes (Signed)
 BP 120/71   Pulse 85   Wt (!) 317 lb 9.6 oz (144.1 kg)   SpO2 98%   BMI 48.29 kg/m    Subjective:    Patient ID: Kimberly Santiago, female    DOB: 03-22-1976, 48 y.o.   MRN: [id number]  HPI: Kimberly Santiago is a 48 y.o. female  Chief Complaint  Patient presents with   Mood   Has been having knee pain. Has seen 4 orthos and they found a mass on her L knee and they are planning on removing it and are going to send it for pathology.   ANEMIA Anemia status:  unsure Etiology of anemia: B12 and iron  deficiency Duration of anemia treatment: about 6 months Compliance with treatment: good compliance Iron  supplementation side effects: no Severity of anemia: moderate Fatigue: yes Decreased exercise tolerance: no  Dyspnea on exertion: no Palpitations: no Bleeding: no Pica: no  ANXIETY/DEPRESSION Duration: chronic Status:stable Anxious mood: yes  Excessive worrying: no Irritability: no  Sweating: no Nausea: no Palpitations:no Hyperventilation: no Panic attacks: no Agoraphobia: no  Obscessions/compulsions: no Depressed mood: yes    08/05/2023    3:11 PM 09/26/2022    8:15 AM 04/23/2022    1:20 PM 10/30/2021    4:31 PM 09/24/2021    8:23 AM  Depression screen PHQ 2/9  Decreased Interest 0 0 0 1 1  Down, Depressed, Hopeless 0 1 0 1 1  PHQ - 2 Score 0 1 0 2 2  Altered sleeping 1 0 0 2 1  Tired, decreased energy 2 1 1 2 1   Change in appetite 1 1 0 1 1  Feeling bad or failure about yourself  0 0 0 1 1  Trouble concentrating 0 0 0 0 0  Moving slowly or fidgety/restless 0 0 0 0 0  Suicidal thoughts 0 0 0 0 0  PHQ-9 Score 4 3 1 8 6   Difficult doing work/chores Not difficult at all Not difficult at all Not difficult at all        08/05/2023    3:11 PM 09/26/2022    8:15 AM 04/23/2022    1:20 PM 10/30/2021    4:32 PM  GAD 7 : Generalized Anxiety Score  Nervous, Anxious, on Edge 1 1 1 2   Control/stop worrying 1 0 1 2  Worry too much - different things 1 1 1 2   Trouble  relaxing 0 0 1 1  Restless 0 0 0 1  Easily annoyed or irritable 0 1 0 1  Afraid - awful might happen 0 0 0 1  Total GAD 7 Score 3 3 4 10   Anxiety Difficulty  Not difficult at all Not difficult at all Not difficult at all   Anhedonia: no Weight changes: no Insomnia: no   Hypersomnia: no Fatigue/loss of energy: yes Feelings of worthlessness: no Feelings of guilt: no Impaired concentration/indecisiveness: no Suicidal ideations: no  Crying spells: no Recent Stressors/Life Changes: no   Relationship problems: no   Family stress: no     Financial stress: no    Job stress: no    Recent death/loss: no  Relevant past medical, surgical, family and social history reviewed and updated as indicated. Interim medical history since our last visit reviewed. Allergies and medications reviewed and updated.  Review of Systems  Constitutional: Negative.   Respiratory: Negative.    Cardiovascular: Negative.   Musculoskeletal:  Positive for arthralgias and joint swelling. Negative for back pain, gait problem, myalgias, neck pain and neck  stiffness.  Skin: Negative.   Psychiatric/Behavioral: Negative.      Per HPI unless specifically indicated above     Objective:    BP 120/71   Pulse 85   Wt (!) 317 lb 9.6 oz (144.1 kg)   SpO2 98%   BMI 48.29 kg/m   Wt Readings from Last 3 Encounters:  08/05/23 (!) 317 lb 9.6 oz (144.1 kg)  02/20/23 (!) 313 lb 12.8 oz (142.3 kg)  12/26/22 (!) 309 lb (140.2 kg)    Physical Exam Vitals and nursing note reviewed.  Constitutional:      General: She is not in acute distress.    Appearance: Normal appearance. She is obese. She is not ill-appearing, toxic-appearing or diaphoretic.  HENT:     Head: Normocephalic and atraumatic.     Right Ear: External ear normal.     Left Ear: External ear normal.     Nose: Nose normal.     Mouth/Throat:     Mouth: Mucous membranes are moist.     Pharynx: Oropharynx is clear.  Eyes:     General: No scleral icterus.        Right eye: No discharge.        Left eye: No discharge.     Extraocular Movements: Extraocular movements intact.     Conjunctiva/sclera: Conjunctivae normal.     Pupils: Pupils are equal, round, and reactive to light.  Cardiovascular:     Rate and Rhythm: Normal rate and regular rhythm.     Pulses: Normal pulses.     Heart sounds: Normal heart sounds. No murmur heard.    No friction rub. No gallop.  Pulmonary:     Effort: Pulmonary effort is normal. No respiratory distress.     Breath sounds: Normal breath sounds. No stridor. No wheezing, rhonchi or rales.  Chest:     Chest wall: No tenderness.  Musculoskeletal:        General: Normal range of motion.     Cervical back: Normal range of motion and neck supple.  Skin:    General: Skin is warm and dry.     Capillary Refill: Capillary refill takes less than 2 seconds.     Coloration: Skin is not jaundiced or pale.     Findings: No bruising, erythema, lesion or rash.  Neurological:     General: No focal deficit present.     Mental Status: She is alert and oriented to person, place, and time. Mental status is at baseline.  Psychiatric:        Mood and Affect: Mood normal.        Behavior: Behavior normal.        Thought Content: Thought content normal.        Judgment: Judgment normal.     Results for orders placed or performed during the hospital encounter of 02/20/23  Pregnancy, urine POC   Collection Time: 02/20/23  7:46 AM  Result Value Ref Range   Preg Test, Ur NEGATIVE NEGATIVE      Assessment & Plan:   Problem List Items Addressed This Visit       Other   Status post bariatric surgery   Will check labs. Await results. Treat as needed.       Relevant Orders   Comprehensive metabolic panel   Lipid Panel w/o Chol/HDL Ratio   TSH   Bayer DCA Hb A1c Waived   Anxiety - Primary   Under good control on current regimen. Continue current regimen.  Continue to monitor. Call with any concerns. Refills given. Labs  drawn today.        Depression   Under good control on current regimen. Continue current regimen. Continue to monitor. Call with any concerns. Refills given. Labs drawn today.       Iron  deficiency anemia   Will recheck labs. Await results. Treat as needed.       Relevant Medications   Iron -Folic Acid -B12-C-Docusate (FERRAPLUS 90 PO)   Other Relevant Orders   CBC with Differential/Platelet   Ferritin   B12   Iron  Binding Cap (TIBC)([facility])     Follow up plan: Return in about 2 months (around 10/03/2023) for physical.

## 2023-08-06 ENCOUNTER — Encounter: Payer: Self-pay | Admitting: Family Medicine

## 2023-08-06 LAB — CBC WITH DIFFERENTIAL/PLATELET
Basophils Absolute: 0.1 10*3/uL (ref 0.0–0.2)
Basos: 1 %
EOS (ABSOLUTE): 0.3 10*3/uL (ref 0.0–0.4)
Eos: 3 %
Hematocrit: 42.5 % (ref 34.0–46.6)
Hemoglobin: 14.2 g/dL (ref 11.1–15.9)
Immature Grans (Abs): 0 10*3/uL (ref 0.0–0.1)
Immature Granulocytes: 0 %
Lymphocytes Absolute: 2.3 10*3/uL (ref 0.7–3.1)
Lymphs: 26 %
MCH: 29.5 pg (ref 26.6–33.0)
MCHC: 33.4 g/dL (ref 31.5–35.7)
MCV: 88 fL (ref 79–97)
Monocytes Absolute: 0.8 10*3/uL (ref 0.1–0.9)
Monocytes: 9 %
Neutrophils Absolute: 5.3 10*3/uL (ref 1.4–7.0)
Neutrophils: 61 %
Platelets: 316 10*3/uL (ref 150–450)
RBC: 4.82 x10E6/uL (ref 3.77–5.28)
RDW: 13.4 % (ref 11.7–15.4)
WBC: 8.7 10*3/uL (ref 3.4–10.8)

## 2023-08-06 LAB — LIPID PANEL W/O CHOL/HDL RATIO
Cholesterol, Total: 171 mg/dL (ref 100–199)
HDL: 59 mg/dL (ref >39)
LDL Chol Calc (NIH): 92 mg/dL (ref 0–99)
Triglycerides: 113 mg/dL (ref 0–149)
VLDL Cholesterol Cal: 20 mg/dL (ref 5–40)

## 2023-08-06 LAB — COMPREHENSIVE METABOLIC PANEL
ALT: 12 [IU]/L (ref 0–32)
AST: 13 [IU]/L (ref 0–40)
Albumin: 3.5 g/dL — ABNORMAL LOW (ref 3.9–4.9)
Alkaline Phosphatase: 75 [IU]/L (ref 44–121)
BUN/Creatinine Ratio: 15 (ref 9–23)
BUN: 14 mg/dL (ref 6–24)
Bilirubin Total: <0.2 mg/dL (ref 0.0–1.2)
CO2: 22 mmol/L (ref 20–29)
Calcium: 8.5 mg/dL — ABNORMAL LOW (ref 8.7–10.2)
Chloride: 107 mmol/L — ABNORMAL HIGH (ref 96–106)
Creatinine, Ser: 0.94 mg/dL (ref 0.57–1.00)
Globulin, Total: 2.6 g/dL (ref 1.5–4.5)
Glucose: 74 mg/dL (ref 70–99)
Potassium: 4.2 mmol/L (ref 3.5–5.2)
Sodium: 142 mmol/L (ref 134–144)
Total Protein: 6.1 g/dL (ref 6.0–8.5)
eGFR: 76 mL/min/{1.73_m2} (ref >59)

## 2023-08-06 LAB — TSH: TSH: 2 u[IU]/mL (ref 0.450–4.500)

## 2023-08-06 LAB — IRON AND TIBC
Iron Saturation: 20 % (ref 15–55)
Iron: 85 ug/dL (ref 27–159)
Total Iron Binding Capacity: 423 ug/dL (ref 250–450)
UIBC: 338 ug/dL (ref 131–425)

## 2023-08-06 LAB — VITAMIN B12: Vitamin B-12: 394 pg/mL (ref 232–1245)

## 2023-08-06 LAB — FERRITIN: Ferritin: 18 ng/mL (ref 15–150)

## 2023-08-24 ENCOUNTER — Other Ambulatory Visit: Payer: Self-pay | Admitting: Family Medicine

## 2023-08-26 NOTE — Telephone Encounter (Signed)
 Requested medications are due for refill today.  unsure  Requested medications are on the active medications list.  yes  Last refill. 04/16/2023  Future visit scheduled.   yes  Notes to clinic.  Sig on request differs form the one on med list. Please review.    Requested Prescriptions  Pending Prescriptions Disp Refills   citalopram (CELEXA) 40 MG tablet [Pharmacy Med Name: CITALOPRAM HBR 40 MG TABLET] 115 tablet 1    Sig: TAKE 1 & 1/2 TABLET BY MOUTH DAILY     Psychiatry:  Antidepressants - SSRI Failed - 08/26/2023  9:59 AM      Failed - Valid encounter within last 6 months    Recent Outpatient Visits           11 months ago Routine general medical examination at a health care facility   [facility] [place], [place] P, DO   1 year ago Cough, unspecified type   [facility] [place], Megan P, DO   1 year ago Epigastric pain   [place] [facility] [place], [place] P, DO   1 year ago Routine general medical examination at a health care facility   [facility] [place], [place] P, DO   1 year ago Greater trochanteric pain syndrome of left lower extremity   [facility] [place], Ocie Bob, MD       Future Appointments             In 1 month [place], Oralia Rud, DO [facility]            Passed - Completed PHQ-2 or PHQ-9 in the last 360 days

## 2023-09-11 ENCOUNTER — Encounter: Payer: Self-pay | Admitting: Family Medicine

## 2023-09-12 NOTE — Telephone Encounter (Signed)
 Appt, virtual OK

## 2023-09-12 NOTE — Telephone Encounter (Signed)
 Called patient to schedule left message on machine for patient to call and schedule a virtual appt.

## 2023-10-08 ENCOUNTER — Encounter: Payer: Self-pay | Admitting: Family Medicine

## 2023-10-08 ENCOUNTER — Ambulatory Visit (INDEPENDENT_AMBULATORY_CARE_PROVIDER_SITE_OTHER): Payer: Managed Care, Other (non HMO) | Admitting: Family Medicine

## 2023-10-08 ENCOUNTER — Other Ambulatory Visit: Payer: Self-pay | Admitting: Family Medicine

## 2023-10-08 VITALS — BP 121/83 | HR 84 | Ht 68.0 in | Wt 318.0 lb

## 2023-10-08 DIAGNOSIS — Z0000 Encounter for general adult medical examination without abnormal findings: Secondary | ICD-10-CM

## 2023-10-08 DIAGNOSIS — G4733 Obstructive sleep apnea (adult) (pediatric): Secondary | ICD-10-CM

## 2023-10-08 DIAGNOSIS — Z23 Encounter for immunization: Secondary | ICD-10-CM | POA: Diagnosis not present

## 2023-10-08 DIAGNOSIS — J301 Allergic rhinitis due to pollen: Secondary | ICD-10-CM | POA: Diagnosis not present

## 2023-10-08 MED ORDER — TIRZEPATIDE-WEIGHT MANAGEMENT 2.5 MG/0.5ML ~~LOC~~ SOLN
2.5000 mg | SUBCUTANEOUS | 0 refills | Status: AC
Start: 2023-10-08 — End: ?

## 2023-10-08 MED ORDER — PREDNISONE 50 MG PO TABS: 50.0000 mg | ORAL_TABLET | Freq: Every day | ORAL | 0 refills | Status: DC

## 2023-10-08 MED ORDER — TIRZEPATIDE-WEIGHT MANAGEMENT 5 MG/0.5ML ~~LOC~~ SOLN: 5.0000 mg | SUBCUTANEOUS | 1 refills | Status: AC

## 2023-10-08 NOTE — Assessment & Plan Note (Signed)
 Will treat with prednisone burst. Call with any concerns or if not getting better.

## 2023-10-08 NOTE — Assessment & Plan Note (Signed)
 Would benefit from zepbound. Will send in. Continue diet and exercise. Recheck 6 weeks. Call with any concerns.

## 2023-10-08 NOTE — Telephone Encounter (Addendum)
 Requested medication (s) are due for refill today: Yes  Requested medication (s) are on the active medication list: Yes  Last refill:  10/08/23  Future visit scheduled: Yes  Notes to clinic:  Unable to refill per protocol due to pharmacy request for an alternative medication.      Requested Prescriptions  Pending Prescriptions Disp Refills   ZEPBOUND 2.5 MG/0.5ML injection vial [Pharmacy Med Name: ZEPBOUND 2.5 MG/0.5 ML VIAL] 2 mL 0    Sig: INJECT 2.5 MG SUBCUTANEOUSLY WEEKLY     Off-Protocol Failed - 10/08/2023  1:19 PM      Failed - Medication not assigned to a protocol, review manually.      Passed - Valid encounter within last 12 months    Recent Outpatient Visits           Today Routine general medical examination at a health care facility   [facility] [place], [place] P, DO   2 months ago Anxiety   [facility] [address] [place], [place], [place]

## 2023-10-08 NOTE — Patient Instructions (Signed)
Zepbound or [employer]

## 2023-10-08 NOTE — Progress Notes (Signed)
 BP 121/83   Pulse 84   Ht 5\' 8"  (1.727 m)   Wt (!) 318 lb (144.2 kg)   SpO2 98%   BMI 48.35 kg/m    Subjective:    Patient ID: Kimberly Santiago, female    DOB: 04-10-1976, 48 y.o.   MRN: [id number]  HPI: Kimberly Santiago is a 48 y.o. female presenting on 10/08/2023 for comprehensive medical examination. Current medical complaints include:  SLEEP APNEA Sleep apnea status: uncontrolled Duration: chronic Satisfied with current treatment?:  no CPAP use:  no Sleep quality with CPAP use:  N/A Treament compliance:good compliance Last sleep study: 2021 Treatments attempted: mouth piece Wakes feeling refreshed:  no Daytime hypersomnolence:  no Fatigue:  yes Insomnia:  no Good sleep hygiene:  yes Difficulty falling asleep:  no Difficulty staying asleep:  no Snoring bothers bed partner:  yes Observed apnea by bed partner: yes Obesity:  yes Hypertension: no  Pulmonary hypertension:  no Coronary artery disease:  no  OBESITY Duration: chronic Previous attempts at weight loss: yes, including gastric bypass Complications of obesity:  Peak weight: 400 Weight loss goal: 215 Weight loss to date: OSA, depression IFG, low back pain, PCOS Requesting obesity pharmacotherapy: yes Current weight loss supplements/medications: no Previous weight loss supplements/meds: no  UPPER RESPIRATORY TRACT INFECTION Duration: 5 days Worst symptom: congestion Fever: no Cough: yes Shortness of breath: yes Wheezing: yes Chest pain: no Chest tightness: yes Chest congestion: no Nasal congestion: yes Runny nose: no Post nasal drip: no Sneezing: yes Sore throat: no Swollen glands: no Sinus pressure: yes Headache: yes Face pain: no Toothache: no Ear pain: no  Ear pressure: yes "right Eyes red/itching:no Eye drainage/crusting: no  Vomiting: no Rash: no Fatigue: yes Sick contacts: yes Strep contacts: no  Context: worse Recurrent sinusitis: no Relief with OTC cold/cough medications: no   Treatments attempted: allegra   She currently lives with: husband and kids Menopausal Symptoms: no  Depression Screen done today and results listed below:     10/08/2023    9:34 AM 08/05/2023    3:11 PM 09/26/2022    8:15 AM 04/23/2022    1:20 PM 10/30/2021    4:31 PM  Depression screen PHQ 2/9  Decreased Interest 0 0 0 0 1  Down, Depressed, Hopeless 0 0 1 0 1  PHQ - 2 Score 0 0 1 0 2  Altered sleeping 1 1 0 0 2  Tired, decreased energy 1 2 1 1 2   Change in appetite 0 1 1 0 1  Feeling bad or failure about yourself  0 0 0 0 1  Trouble concentrating 0 0 0 0 0  Moving slowly or fidgety/restless 0 0 0 0 0  Suicidal thoughts 0 0 0 0 0  PHQ-9 Score 2 4 3 1 8   Difficult doing work/chores Not difficult at all Not difficult at all Not difficult at all Not difficult at all     Past Medical History:  Past Medical History:  Diagnosis Date   Anemia    Anxiety    Depression    Endometriosis    Gastric polyp 02/20/2023   Gastritis, chronic    GERD (gastroesophageal reflux disease)    Hiatal hernia    SMALL   History of Barrett's esophagus    per pt last EGD 07-07-2020 no evidence noted   History of obstructive sleep apnea    per pt prior to gastric bypass 2016, stated retested after bypass and tested negative   History of  panic attacks    History of thrombophlebitis 2005   per pt LLE superficial   Migraines    OAB (overactive bladder)    Obese    Ovarian cyst, right    PCOS (polycystic ovarian syndrome)    PONV (postoperative nausea and vomiting)    S/P gastric bypass 09/08/2014   SUI (stress urinary incontinence, female)    Thyroid nodule    hx bx 02-09-2016  , benign,  in care everywhere    Surgical History:  Past Surgical History:  Procedure Laterality Date   BIOPSY  02/20/2023   Procedure: BIOPSY;  Surgeon: Midge Minium, MD;  Location: [facility] ENDOSCOPY;  Service: Endoscopy;;   CESAREAN SECTION N/A 08/18/2017   Procedure: Primary CESAREAN SECTION;  Surgeon: Noland Fordyce, MD;  Location: [facility];  Service: Obstetrics;  Laterality: N/A;  EDD: 09/13/17 Allergy: Amoxicillin, Morphine, Hydrocodone   CESAREAN SECTION WITH BILATERAL TUBAL LIGATION Bilateral 07/27/2019   Procedure: Repeat CESAREAN SECTION WITH BILATERAL TUBAL LIGATION;  Surgeon: Noland Fordyce, MD;  Location: [facility];  Service: Obstetrics;  Laterality: Bilateral;  EDD: 07/31/19   COLONOSCOPY WITH PROPOFOL N/A 07/07/2020   Procedure: COLONOSCOPY WITH PROPOFOL;  Surgeon: Midge Minium, MD;  Location: [facility];  Service: Endoscopy;  Laterality: N/A;   DIAGNOSTIC LAPAROSCOPY  2006   for endometriosis   EAR CYST EXCISION N/A 11/24/2014   Procedure: CYST REMOVAL;  Surgeon: Bud Face, MD;  Location: [facility] ORS;  Service: ENT;  Laterality: N/A;  upper lip   ESOPHAGOGASTRODUODENOSCOPY (EGD) WITH PROPOFOL N/A 07/07/2020   Procedure: ESOPHAGOGASTRODUODENOSCOPY (EGD) WITH PROPOFOL;  Surgeon: Midge Minium, MD;  Location: [facility];  Service: Endoscopy;  Laterality: N/A;  sleep apnea   ESOPHAGOGASTRODUODENOSCOPY (EGD) WITH PROPOFOL N/A 02/20/2023   Procedure: ESOPHAGOGASTRODUODENOSCOPY (EGD) WITH PROPOFOL;  Surgeon: Midge Minium, MD;  Location: [facility] ENDOSCOPY;  Service: Endoscopy;  Laterality: N/A;   ETHMOIDECTOMY Right 05/07/2018   Procedure: ETHMOIDECTOMY;  Surgeon: Bud Face, MD;  Location: [facility] ORS;  Service: ENT;  Laterality: Right;   FRONTAL SINUS EXPLORATION Right 05/07/2018   Procedure: FRONTAL SINUS EXPLORATION;  Surgeon: Bud Face, MD;  Location: [facility] ORS;  Service: ENT;  Laterality: Right;   IMAGE GUIDED SINUS SURGERY N/A 05/07/2018   Procedure: IMAGE GUIDED SINUS SURGERY;  Surgeon: Bud Face, MD;  Location: [facility] ORS;  Service: ENT;  Laterality: N/A;   KNEE ARTHROSCOPY Right 2006   LAPAROSCOPIC CHOLECYSTECTOMY  2011   LAPAROSCOPIC GASTRIC BYPASS  08/30/2014   @[facility] ;   AND ROUX-EN-Y GASTROENTEROSTOMY   LAPAROSCOPIC SALPINGO OOPHERECTOMY  Right 11/20/2021   Procedure: LAPAROSCOPIC SALPINGO OOPHORECTOMY;  Surgeon: Noland Fordyce, MD;  Location: [facility];  Service: Gynecology;  Laterality: Right;   MAXILLARY ANTROSTOMY Bilateral 05/07/2018   Procedure: MAXILLARY ANTROSTOMY;  Surgeon: Bud Face, MD;  Location: [facility] ORS;  Service: ENT;  Laterality: Bilateral;   NASAL SEPTOPLASTY W/ TURBINOPLASTY Bilateral 05/07/2018   Procedure: NASAL SEPTOPLASTY WITH TURBINATE REDUCTION;  Surgeon: Bud Face, MD;  Location: [facility] ORS;  Service: ENT;  Laterality: Bilateral;   SPHENOIDECTOMY Right 05/07/2018   Procedure: Selina Cooley;  Surgeon: Bud Face, MD;  Location: [facility] ORS;  Service: ENT;  Laterality: Right;   TEMPOROMANDIBULAR JOINT SURGERY Right 2003   WISDOM TOOTH EXTRACTION      Medications:  Current Outpatient Medications on File Prior to Visit  Medication Sig   acetaminophen (TYLENOL) 325 MG tablet Take 650 mg by mouth every 6 (six) hours as needed for mild pain or headache.   citalopram (  CELEXA) 40 MG tablet TAKE 1 & 1/2 TABLET BY MOUTH DAILY   clobetasol ointment (TEMOVATE) 0.05 % APPLY THIN COAT TO AFFECTED AREA TWICE A DAY   diclofenac Sodium (VOLTAREN) 1 % GEL Apply 4 g topically 4 (four) times daily. (Patient taking differently: Apply 4 g topically 4 (four) times daily as needed.)   famotidine (PEPCID) 20 MG tablet Take 1 tablet (20 mg total) by mouth at bedtime. (Patient taking differently: Take 20 mg by mouth at bedtime.)   fexofenadine (ALLEGRA) 180 MG tablet Take 180 mg by mouth daily.   Iron-Folic Acid-B12-C-Docusate (FERRAPLUS 90 PO) TK 1 T PO D   JUNEL FE 1/20 1-20 MG-MCG tablet Take 1 tablet by mouth daily.   nystatin ointment (MYCOSTATIN) Apply 1 Application topically 2 (two) times daily.   pantoprazole (PROTONIX) 40 MG tablet TAKE 2 TABLETS BY MOUTH EVERY DAY   [DISCONTINUED] fluticasone (FLONASE) 50 MCG/ACT nasal spray Place 2 sprays into both nostrils at bedtime.   No  current facility-administered medications on file prior to visit.    Allergies:  Allergies  Allergen Reactions   Hydromorphone Shortness Of Breath, Nausea And Vomiting and Other (See Comments)    Chest pain   Hydrocodone Nausea And Vomiting and Other (See Comments)    Headache, Patient can take oxycodone.   Morphine And Codeine Nausea And Vomiting and Other (See Comments)    Headache   Nsaids Other (See Comments)    Pt cannot take due to Gastric Bypass Surgery   Amoxicillin Itching    Can take cephalexin. Did it involve swelling of the face/tongue/throat, SOB, or low BP? No Did it involve sudden or severe rash/hives, skin peeling, or any reaction on the inside of your mouth or nose? No Did you need to seek medical attention at a hospital or doctor's office? No When did it last happen?  Several years     If all above answers are "NO", may proceed with cephalosporin use.     Social History:  Social History   Socioeconomic History   Marital status: Married    Spouse name: Candelaria Pies   Number of children: 2   Years of education: Not on file   Highest education level: Bachelor's degree (e.g., BA, AB, BS)  Occupational History   Not on file  Tobacco Use   Smoking status: Never   Smokeless tobacco: Never  Vaping Use   Vaping status: Never Used  Substance and Sexual Activity   Alcohol use: Not Currently   Drug use: Never   Sexual activity: Yes    Partners: Male    Birth control/protection: Surgical  Other Topics Concern   Not on file  Social History Narrative   Not on file   Social Drivers of Health   Financial Resource Strain: Low Risk  (10/08/2023)   Overall Financial Resource Strain (CARDIA)    Difficulty of Paying Living Expenses: Not hard at all  Food Insecurity: No Food Insecurity (10/08/2023)   Hunger Vital Sign    Worried About Running Out of Food in the Last Year: Never true    Ran Out of Food in the Last Year: Never true  Transportation Needs: No  Transportation Needs (10/08/2023)   PRAPARE - Administrator, Civil Service (Medical): No    Lack of Transportation (Non-Medical): No  Physical Activity: Insufficiently Active (10/08/2023)   Exercise Vital Sign    Days of Exercise per Week: 2 days    Minutes of Exercise per Session: 20  min  Stress: Stress Concern Present (10/08/2023)   [employer] of Occupational Health - Occupational Stress Questionnaire    Feeling of Stress : To some extent  Social Connections: Socially Integrated (10/08/2023)   Social Connection and Isolation Panel [NHANES]    Frequency of Communication with Friends and Family: More than three times a week    Frequency of Social Gatherings with Friends and Family: Once a week    Attends Religious Services: More than 4 times per year    Active Member of [employer] or Organizations: Yes    Attends Engineer, structural: More than 4 times per year    Marital Status: Married  Catering manager Violence: Not At Risk (10/08/2023)   Humiliation, Afraid, Rape, and Kick questionnaire    Fear of Current or Ex-Partner: No    Emotionally Abused: No    Physically Abused: No    Sexually Abused: No   Social History   Tobacco Use  Smoking Status Never  Smokeless Tobacco Never   Social History   Substance and Sexual Activity  Alcohol Use Not Currently    Family History:  Family History  Problem Relation Age of Onset   Breast cancer Paternal Aunt        pat great aunt   Heart disease Maternal Grandmother    Heart disease Maternal Grandfather    Heart disease Paternal Grandmother    Heart disease Paternal Grandfather    Anxiety disorder Mother    Depression Mother    Miscarriages / [place] Sister     Past medical history, surgical history, medications, allergies, family history and social history reviewed with patient today and changes made to appropriate areas of the chart.   Review of Systems  Constitutional: Negative.   HENT:  Positive for  congestion and ear pain. Negative for ear discharge, hearing loss, nosebleeds, sinus pain, sore throat and tinnitus.   Eyes: Negative.   Respiratory:  Positive for cough, shortness of breath and wheezing. Negative for hemoptysis, sputum production and stridor.   Cardiovascular: Negative.   Gastrointestinal:  Positive for nausea. Negative for abdominal pain, blood in stool, constipation, diarrhea, heartburn, melena and vomiting.  Genitourinary:  Positive for frequency. Negative for dysuria, flank pain, hematuria and urgency.  Musculoskeletal: Negative.   Skin: Negative.   Neurological: Negative.   Endo/Heme/Allergies:  Positive for environmental allergies. Negative for polydipsia. Does not bruise/bleed easily.  Psychiatric/Behavioral: Negative.     All other ROS negative except what is listed above and in the HPI.      Objective:    BP 121/83   Pulse 84   Ht 5\' 8"  (1.727 m)   Wt (!) 318 lb (144.2 kg)   SpO2 98%   BMI 48.35 kg/m   Wt Readings from Last 3 Encounters:  10/08/23 (!) 318 lb (144.2 kg)  08/05/23 (!) 317 lb 9.6 oz (144.1 kg)  02/20/23 (!) 313 lb 12.8 oz (142.3 kg)    Physical Exam Vitals and nursing note reviewed.  Constitutional:      General: She is not in acute distress.    Appearance: Normal appearance. She is obese. She is not ill-appearing, toxic-appearing or diaphoretic.  HENT:     Head: Normocephalic and atraumatic.     Right Ear: Tympanic membrane, ear canal and external ear normal. There is no impacted cerumen.     Left Ear: Tympanic membrane, ear canal and external ear normal. There is no impacted cerumen.     Nose: Nose normal. No congestion  or rhinorrhea.     Mouth/Throat:     Mouth: Mucous membranes are moist.     Pharynx: Oropharynx is clear. No oropharyngeal exudate or posterior oropharyngeal erythema.  Eyes:     General: No scleral icterus.       Right eye: No discharge.        Left eye: No discharge.     Extraocular Movements: Extraocular  movements intact.     Conjunctiva/sclera: Conjunctivae normal.     Pupils: Pupils are equal, round, and reactive to light.  Neck:     Vascular: No carotid bruit.  Cardiovascular:     Rate and Rhythm: Normal rate and regular rhythm.     Pulses: Normal pulses.     Heart sounds: No murmur heard.    No friction rub. No gallop.  Pulmonary:     Effort: Pulmonary effort is normal. No respiratory distress.     Breath sounds: Normal breath sounds. No stridor. No wheezing, rhonchi or rales.  Chest:     Chest wall: No tenderness.  Abdominal:     General: Abdomen is flat. Bowel sounds are normal. There is no distension.     Palpations: Abdomen is soft. There is no mass.     Tenderness: There is no abdominal tenderness. There is no right CVA tenderness, left CVA tenderness, guarding or rebound.     Hernia: No hernia is present.  Genitourinary:    Comments: Breast and pelvic exams deferred with shared decision making Musculoskeletal:        General: No swelling, tenderness, deformity or signs of injury.     Cervical back: Normal range of motion and neck supple. No rigidity. No muscular tenderness.     Right lower leg: No edema.     Left lower leg: No edema.  Lymphadenopathy:     Cervical: No cervical adenopathy.  Skin:    General: Skin is warm and dry.     Capillary Refill: Capillary refill takes less than 2 seconds.     Coloration: Skin is not jaundiced or pale.     Findings: No bruising, erythema, lesion or rash.  Neurological:     General: No focal deficit present.     Mental Status: She is alert and oriented to person, place, and time. Mental status is at baseline.     Cranial Nerves: No cranial nerve deficit.     Sensory: No sensory deficit.     Motor: No weakness.     Coordination: Coordination normal.     Gait: Gait normal.     Deep Tendon Reflexes: Reflexes normal.  Psychiatric:        Mood and Affect: Mood normal.        Behavior: Behavior normal.        Thought Content:  Thought content normal.        Judgment: Judgment normal.     Results for orders placed or performed in visit on 08/05/23  CBC with Differential/Platelet   Collection Time: 08/05/23  3:36 PM  Result Value Ref Range   WBC 8.7 3.4 - 10.8 x10E3/uL   RBC 4.82 3.77 - 5.28 x10E6/uL   Hemoglobin 14.2 11.1 - 15.9 g/dL   Hematocrit 16.1 09.6 - 46.6 %   MCV 88 79 - 97 fL   MCH 29.5 26.6 - 33.0 pg   MCHC 33.4 31.5 - 35.7 g/dL   RDW 04.5 40.9 - 81.1 %   Platelets 316 150 - 450 x10E3/uL   Neutrophils 61 Not  Estab. %   Lymphs 26 Not Estab. %   Monocytes 9 Not Estab. %   Eos 3 Not Estab. %   Basos 1 Not Estab. %   Neutrophils Absolute 5.3 1.4 - 7.0 x10E3/uL   Lymphocytes Absolute 2.3 0.7 - 3.1 x10E3/uL   Monocytes Absolute 0.8 0.1 - 0.9 x10E3/uL   EOS (ABSOLUTE) 0.3 0.0 - 0.4 x10E3/uL   Basophils Absolute 0.1 0.0 - 0.2 x10E3/uL   Immature Granulocytes 0 Not Estab. %   Immature Grans (Abs) 0.0 0.0 - 0.1 x10E3/uL  Ferritin   Collection Time: 08/05/23  3:36 PM  Result Value Ref Range   Ferritin 18 15 - 150 ng/mL  B12   Collection Time: 08/05/23  3:36 PM  Result Value Ref Range   Vitamin B-12 394 232 - 1,245 pg/mL  Iron Binding Cap (TIBC)([facility])   Collection Time: 08/05/23  3:36 PM  Result Value Ref Range   Total Iron Binding Capacity 423 250 - 450 ug/dL   UIBC 161 096 - 045 ug/dL   Iron 85 27 - 409 ug/dL   Iron Saturation 20 15 - 55 %  Comprehensive metabolic panel   Collection Time: 08/05/23  3:36 PM  Result Value Ref Range   Glucose 74 70 - 99 mg/dL   BUN 14 6 - 24 mg/dL   Creatinine, Ser 8.11 0.57 - 1.00 mg/dL   eGFR 76 >91 YN/WGN/5.62   BUN/Creatinine Ratio 15 9 - 23   Sodium 142 134 - 144 mmol/L   Potassium 4.2 3.5 - 5.2 mmol/L   Chloride 107 (H) 96 - 106 mmol/L   CO2 22 20 - 29 mmol/L   Calcium 8.5 (L) 8.7 - 10.2 mg/dL   Total Protein 6.1 6.0 - 8.5 g/dL   Albumin 3.5 (L) 3.9 - 4.9 g/dL   Globulin, Total 2.6 1.5 - 4.5 g/dL   Bilirubin Total <1.3 0.0 - 1.2  mg/dL   Alkaline Phosphatase 75 44 - 121 IU/L   AST 13 0 - 40 IU/L   ALT 12 0 - 32 IU/L  Lipid Panel w/o Chol/HDL Ratio   Collection Time: 08/05/23  3:36 PM  Result Value Ref Range   Cholesterol, Total 171 100 - 199 mg/dL   Triglycerides 086 0 - 149 mg/dL   HDL 59 >57 mg/dL   VLDL Cholesterol Cal 20 5 - 40 mg/dL   LDL Chol Calc (NIH) 92 0 - 99 mg/dL  TSH   Collection Time: 08/05/23  3:36 PM  Result Value Ref Range   TSH 2.000 0.450 - 4.500 uIU/mL  Bayer DCA Hb A1c Waived   Collection Time: 08/05/23  3:36 PM  Result Value Ref Range   HB A1C (BAYER DCA - WAIVED) 5.3 4.8 - 5.6 %      Assessment & Plan:   Problem List Items Addressed This Visit       Respiratory   OSA (obstructive sleep apnea)   Not severe enough for CPAP. Would benefit from zepbound. Will send in.       Seasonal allergic rhinitis   Will treat with prednisone burst. Call with any concerns or if not getting better.         Other   Morbid obesity (HCC)   Would benefit from zepbound. Will send in. Continue diet and exercise. Recheck 6 weeks. Call with any concerns.       Relevant Medications   tirzepatide 5 MG/0.5ML injection vial (Start on 11/05/2023)   tirzepatide (ZEPBOUND) 2.5 MG/0.5ML injection vial  Other Visit Diagnoses       Routine general medical examination at a health care facility    -  Primary   Vaccines up to date. Screening labs checked today. Pap through GYN. Mammo and colonoscopy up to date. Continue diet and exercise. Call with any concerns.     Need for pneumococcal vaccination       Will come back for prevnar.   Relevant Orders   Pneumococcal conjugate vaccine 20-valent (Prevnar 20)        Follow up plan: Return in about 6 weeks (around 11/19/2023) for records release [facility].   LABORATORY TESTING:  - Pap smear: done elsewhere  IMMUNIZATIONS:   - Tdap: Tetanus vaccination status reviewed: last tetanus booster within 10 years. - Influenza: Up to date - Prevnar:   Will come back for - COVID: Refused - HPV: Not applicable - Shingrix vaccine: Not applicable  SCREENING: -Mammogram: Up to date  - Colonoscopy: Up to date   PATIENT COUNSELING:   Advised to take 1 mg of folate supplement per day if capable of pregnancy.   Sexuality: Discussed sexually transmitted diseases, partner selection, use of condoms, avoidance of unintended pregnancy  and contraceptive alternatives.   Advised to avoid cigarette smoking.  I discussed with the patient that most people either abstain from alcohol or drink within safe limits (<=14/week and <=4 drinks/occasion for males, <=7/weeks and <= 3 drinks/occasion for females) and that the risk for alcohol disorders and other health effects rises proportionally with the number of drinks per week and how often a drinker exceeds daily limits.  Discussed cessation/primary prevention of drug use and availability of treatment for abuse.   Diet: Encouraged to adjust caloric intake to maintain  or achieve ideal body weight, to reduce intake of dietary saturated fat and total fat, to limit sodium intake by avoiding high sodium foods and not adding table salt, and to maintain adequate dietary potassium and calcium preferably from fresh fruits, vegetables, and low-fat dairy products.    stressed the importance of regular exercise  Injury prevention: Discussed safety belts, safety helmets, smoke detector, smoking near bedding or upholstery.   Dental health: Discussed importance of regular tooth brushing, flossing, and dental visits.    NEXT PREVENTATIVE PHYSICAL DUE IN 1 YEAR. Return in about 6 weeks (around 11/19/2023) for records release [facility].

## 2023-10-08 NOTE — Assessment & Plan Note (Signed)
 Not severe enough for CPAP. Would benefit from zepbound. Will send in.

## 2023-10-10 ENCOUNTER — Encounter: Payer: Self-pay | Admitting: Family Medicine

## 2023-10-20 ENCOUNTER — Telehealth: Payer: Self-pay

## 2023-10-20 NOTE — Telephone Encounter (Signed)
 Noted.  No further action at this time.

## 2023-10-20 NOTE — Telephone Encounter (Signed)
 Copied from CRM [id number]. Topic: Clinical - Prescription Issue >> Oct 17, 2023  1:58 PM Antwanette L wrote: Reason for CRM: Ruther Cower from [facility] is calling in to let Dr. Lincoln Renshaw know that the prior authorization for Zepbound was denied. Ruther Cower said that the patient and Dr. Lincoln Renshaw will receive a letter on why the prior authorization got denied

## 2023-10-22 ENCOUNTER — Encounter: Payer: Self-pay | Admitting: Family Medicine

## 2023-10-29 ENCOUNTER — Ambulatory Visit (INDEPENDENT_AMBULATORY_CARE_PROVIDER_SITE_OTHER)

## 2023-10-29 ENCOUNTER — Ambulatory Visit
Admission: EM | Admit: 2023-10-29 | Discharge: 2023-10-29 | Disposition: A | Discharge status: Home / Self Care | Attending: Physician Assistant | Admitting: Physician Assistant

## 2023-10-29 DIAGNOSIS — R052 Subacute cough: Secondary | ICD-10-CM

## 2023-10-29 DIAGNOSIS — J209 Acute bronchitis, unspecified: Secondary | ICD-10-CM | POA: Diagnosis not present

## 2023-10-29 MED ORDER — DOXYCYCLINE HYCLATE 100 MG PO CAPS: 100.0000 mg | ORAL_CAPSULE | Freq: Two times a day (BID) | ORAL | 0 refills | Status: AC

## 2023-10-29 MED ORDER — PROMETHAZINE-DM 6.25-15 MG/5ML PO SYRP: 5.0000 mL | ORAL_SOLUTION | Freq: Four times a day (QID) | ORAL | 0 refills | Status: DC | PRN

## 2023-10-29 MED ORDER — PREDNISONE 10 MG PO TABS
ORAL_TABLET | ORAL | 0 refills | Status: DC
Start: 2023-10-29 — End: 2024-01-07

## 2023-10-29 NOTE — ED Triage Notes (Signed)
 Pt c/o cough x1 mon. Has tried allegra,zyrtec & delsym w/o relief.

## 2023-10-29 NOTE — ED Provider Notes (Signed)
 [facility]    CSN: [id number] Arrival date & time: 10/29/23  1719      History   Chief Complaint Chief Complaint  Patient presents with   Cough    HPI Kimberly Santiago is a 48 y.o. female presenting for 4 week history of cough, fatigue, and congestion. Denies fever, ear pain, sinus pain, sore throat, chest pain, wheezing, shortness of breath, abdominal pain, nausea or vomiting. Child has had a cough for 1 month as well. Patient has tried Delsym and antihistamines without relief. No history of asthma and patient is a non smoker.  HPI  Past Medical History:  Diagnosis Date   Anemia    Anxiety    Depression    Endometriosis    Gastric polyp 02/20/2023   Gastritis, chronic    GERD (gastroesophageal reflux disease)    Hiatal hernia    SMALL   History of Barrett's esophagus    per pt last EGD 07-07-2020 no evidence noted   History of obstructive sleep apnea    per pt prior to gastric bypass 2016, stated retested after bypass and tested negative   History of panic attacks    History of thrombophlebitis 2005   per pt LLE superficial   Migraines    OAB (overactive bladder)    Obese    Ovarian cyst, right    PCOS (polycystic ovarian syndrome)    PONV (postoperative nausea and vomiting)    S/P gastric bypass 09/08/2014   SUI (stress urinary incontinence, female)    Thyroid  nodule    hx bx 02-09-2016  , benign,  in care everywhere    Patient Active Problem List   Diagnosis Date Noted   Morbid obesity (HCC) 10/08/2023   Iron  deficiency anemia 08/05/2023   Post-operative nausea and vomiting 09/24/2021   Piriformis syndrome, left 08/31/2021   SI (sacroiliac) pain 08/31/2021   Greater trochanteric pain syndrome of left lower extremity 08/23/2021   Lumbar spondylosis 08/23/2021   Left leg pain 08/02/2021   Overactive bladder 04/01/2021   Urge incontinence 04/01/2021   Stress incontinence, female 04/01/2021   Barrett's esophagus without dysplasia    Abnormal  uterine bleeding (AUB) 03/17/2020   Polycystic ovarian syndrome 03/17/2020   OSA (obstructive sleep apnea) 03/09/2020   GERD (gastroesophageal reflux disease)    BMI 45.0-49.9, adult (HCC) 03/17/2018   Seasonal allergic rhinitis 01/12/2018   Anxiety 08/21/2017   Depression 08/21/2017   Postoperative malabsorption 09/19/2015   Status post bariatric surgery 09/27/2014   Mild intermittent asthma without complication 08/09/2014   Insulin resistance 12/20/2013   Thyroid  nodule 12/20/2013   H/O thyroid  nodule 07/29/2011    Past Surgical History:  Procedure Laterality Date   BIOPSY  02/20/2023   Procedure: BIOPSY;  Surgeon: Marnee Sink, MD;  Location: [facility];  Service: Endoscopy;;   CESAREAN SECTION N/A 08/18/2017   Procedure: Primary CESAREAN SECTION;  Surgeon: Audelia Leaks, MD;  Location: [facility];  Service: Obstetrics;  Laterality: N/A;  EDD: 09/13/17 Allergy: Amoxicillin , Morphine , Hydrocodone   CESAREAN SECTION WITH BILATERAL TUBAL LIGATION Bilateral 07/27/2019   Procedure: Repeat CESAREAN SECTION WITH BILATERAL TUBAL LIGATION;  Surgeon: Audelia Leaks, MD;  Location: [facility];  Service: Obstetrics;  Laterality: Bilateral;  EDD: 07/31/19   COLONOSCOPY WITH PROPOFOL  N/A 07/07/2020   Procedure: COLONOSCOPY WITH PROPOFOL ;  Surgeon: Marnee Sink, MD;  Location: [facility];  Service: Endoscopy;  Laterality: N/A;   DIAGNOSTIC LAPAROSCOPY  2006   for endometriosis   EAR CYST  EXCISION N/A 11/24/2014   Procedure: CYST REMOVAL;  Surgeon: Rogers Clayman, MD;  Location: [facility];  Service: ENT;  Laterality: N/A;  upper lip   ESOPHAGOGASTRODUODENOSCOPY (EGD) WITH PROPOFOL  N/A 07/07/2020   Procedure: ESOPHAGOGASTRODUODENOSCOPY (EGD) WITH PROPOFOL ;  Surgeon: Marnee Sink, MD;  Location: [facility];  Service: Endoscopy;  Laterality: N/A;  sleep apnea   ESOPHAGOGASTRODUODENOSCOPY (EGD) WITH PROPOFOL  N/A 02/20/2023   Procedure: ESOPHAGOGASTRODUODENOSCOPY (EGD)  WITH PROPOFOL ;  Surgeon: Marnee Sink, MD;  Location: [facility];  Service: Endoscopy;  Laterality: N/A;   ETHMOIDECTOMY Right 05/07/2018   Procedure: ETHMOIDECTOMY;  Surgeon: Rogers Clayman, MD;  Location: [facility];  Service: ENT;  Laterality: Right;   FRONTAL SINUS EXPLORATION Right 05/07/2018   Procedure: FRONTAL SINUS EXPLORATION;  Surgeon: Rogers Clayman, MD;  Location: [facility];  Service: ENT;  Laterality: Right;   IMAGE GUIDED SINUS SURGERY N/A 05/07/2018   Procedure: IMAGE GUIDED SINUS SURGERY;  Surgeon: Rogers Clayman, MD;  Location: [facility];  Service: ENT;  Laterality: N/A;   KNEE ARTHROSCOPY Right 2006   LAPAROSCOPIC CHOLECYSTECTOMY  2011   LAPAROSCOPIC GASTRIC BYPASS  08/30/2014   @[facility] ;   AND ROUX-EN-Y GASTROENTEROSTOMY   LAPAROSCOPIC SALPINGO OOPHERECTOMY Right 11/20/2021   Procedure: LAPAROSCOPIC SALPINGO OOPHORECTOMY;  Surgeon: Audelia Leaks, MD;  Location: [facility];  Service: Gynecology;  Laterality: Right;   MAXILLARY ANTROSTOMY Bilateral 05/07/2018   Procedure: MAXILLARY ANTROSTOMY;  Surgeon: Rogers Clayman, MD;  Location: [facility];  Service: ENT;  Laterality: Bilateral;   NASAL SEPTOPLASTY W/ TURBINOPLASTY Bilateral 05/07/2018   Procedure: NASAL SEPTOPLASTY WITH TURBINATE REDUCTION;  Surgeon: Rogers Clayman, MD;  Location: [facility];  Service: ENT;  Laterality: Bilateral;   SPHENOIDECTOMY Right 05/07/2018   Procedure: Rupert Counts;  Surgeon: Rogers Clayman, MD;  Location: [facility];  Service: ENT;  Laterality: Right;   TEMPOROMANDIBULAR JOINT SURGERY Right 2003   WISDOM TOOTH EXTRACTION      OB History     Gravida  2   Para  2   Term  1   Preterm  1   AB      Living  2      SAB      IAB      Ectopic      Multiple  0   Live Births  2            Home Medications    Prior to Admission medications   Medication Sig Start Date End Date Taking? Authorizing Provider  acetaminophen  (TYLENOL ) 325 MG tablet  Take 650 mg by mouth every 6 (six) hours as needed for mild pain or headache.   Yes [provider]  citalopram  (CELEXA ) 40 MG tablet TAKE 1 & 1/2 TABLET BY MOUTH DAILY 08/26/23  Yes Johnson, Megan P, DO  clobetasol ointment (TEMOVATE) 0.05 % APPLY THIN COAT TO AFFECTED AREA TWICE A DAY 07/01/23  Yes [provider]  diclofenac  Sodium (VOLTAREN ) 1 % GEL Apply 4 g topically 4 (four) times daily. Patient taking differently: Apply 4 g topically 4 (four) times daily as needed. 05/29/21  Yes Johnson, Megan P, DO  doxycycline  (VIBRAMYCIN ) 100 MG capsule Take 1 capsule (100 mg total) by mouth 2 (two) times daily for 7 days. 10/29/23 11/05/23 Yes Floydene Hy, PA-C  famotidine  (PEPCID ) 20 MG tablet Take 1 tablet (20 mg total) by mouth at bedtime. Patient taking differently: Take 20 mg by mouth at bedtime. 09/24/21  Yes Johnson, Megan P, DO  fexofenadine (ALLEGRA) 180 MG tablet Take  180 mg by mouth daily.   Yes [provider]  Iron -Folic Acid -B12-C-Docusate (FERRAPLUS 90 PO) TK 1 T PO D   Yes [provider]  JUNEL FE 1/20 1-20 MG-MCG tablet Take 1 tablet by mouth daily. 04/08/22  Yes [provider]  nystatin  ointment (MYCOSTATIN ) Apply 1 Application topically 2 (two) times daily.   Yes [provider]  pantoprazole  (PROTONIX ) 40 MG tablet TAKE 2 TABLETS BY MOUTH EVERY DAY 06/05/23  Yes Johnson, Megan P, DO  predniSONE  (DELTASONE ) 10 MG tablet Take 6 tabs p.o. on day 1 and decrease by 1 tablet daily until complete 10/29/23  Yes Nancy Axon B, PA-C  promethazine -dextromethorphan (PROMETHAZINE -DM) 6.25-15 MG/5ML syrup Take 5 mLs by mouth 4 (four) times daily as needed. 10/29/23  Yes Nancy Axon B, PA-C  tirzepatide (ZEPBOUND) 2.5 MG/0.5ML injection vial Inject 2.5 mg into the skin once a week. 10/08/23  Yes Johnson, Megan P, DO  tirzepatide 5 MG/0.5ML injection vial Inject 5 mg into the skin once a week. 11/05/23  Yes Johnson, Megan P, DO  fluticasone (FLONASE)  50 MCG/ACT nasal spray Place 2 sprays into both nostrils at bedtime.  05/15/20  [provider]    Family History Family History  Problem Relation Age of Onset   Breast cancer Paternal Aunt        pat great aunt   Heart disease Maternal Grandmother    Heart disease Maternal Grandfather    Heart disease Paternal Grandmother    Heart disease Paternal Grandfather    Anxiety disorder Mother    Depression Mother    Miscarriages / [place] Sister     Social History Social History   Tobacco Use   Smoking status: Never   Smokeless tobacco: Never  Vaping Use   Vaping status: Never Used  Substance Use Topics   Alcohol use: Not Currently   Drug use: Never     Allergies   Hydromorphone , Hydrocodone, Morphine  and codeine, Nsaids, and Amoxicillin    Review of Systems Review of Systems  Constitutional:  Positive for fatigue. Negative for chills, diaphoresis and fever.  HENT:  Positive for congestion. Negative for ear pain, rhinorrhea, sinus pressure, sinus pain and sore throat.   Respiratory:  Positive for cough. Negative for shortness of breath and wheezing.   Cardiovascular:  Negative for chest pain.  Gastrointestinal:  Negative for abdominal pain, nausea and vomiting.  Musculoskeletal:  Negative for arthralgias and myalgias.  Skin:  Negative for rash.  Neurological:  Negative for weakness and headaches.  Hematological:  Negative for adenopathy.  Psychiatric/Behavioral:  Positive for sleep disturbance (due to cough).      Physical Exam Triage Vital Signs ED Triage Vitals  Encounter Vitals Group     BP --      Systolic BP Percentile --      Diastolic BP Percentile --      Pulse --      Resp 10/29/23 1728 16     Temp --      Temp Source 10/29/23 1728 Oral     SpO2 --      Weight 10/29/23 1727 (!) 318 lb (144.2 kg)     Height 10/29/23 1727 5\' 8"  (1.727 m)     Head Circumference --      Peak Flow --      Pain Score --      Pain Loc --      Pain Education  --      Exclude from Growth Chart --  No data found.  Updated Vital Signs BP 136/81 (BP Location: Left Arm)   Pulse 88   Temp 99.3 F (37.4 C) (Oral)   Resp 16   Ht 5\' 8"  (1.727 m)   Wt (!) 318 lb (144.2 kg)   LMP 09/05/2022 (Approximate)   SpO2 94%   BMI 48.35 kg/m     Physical Exam Vitals and nursing note reviewed.  Constitutional:      General: She is not in acute distress.    Appearance: Normal appearance. She is not ill-appearing or toxic-appearing.  HENT:     Head: Normocephalic and atraumatic.     Right Ear: Tympanic membrane, ear canal and external ear normal.     Left Ear: Tympanic membrane, ear canal and external ear normal.     Nose: Congestion present.     Mouth/Throat:     Mouth: Mucous membranes are moist.     Pharynx: Oropharynx is clear.  Eyes:     General: No scleral icterus.       Right eye: No discharge.        Left eye: No discharge.     Conjunctiva/sclera: Conjunctivae normal.  Cardiovascular:     Rate and Rhythm: Normal rate and regular rhythm.     Heart sounds: Normal heart sounds.  Pulmonary:     Effort: Pulmonary effort is normal. No respiratory distress.     Breath sounds: Rhonchi present.  Musculoskeletal:     Cervical back: Neck supple.  Skin:    General: Skin is dry.  Neurological:     General: No focal deficit present.     Mental Status: She is alert. Mental status is at baseline.     Motor: No weakness.     Gait: Gait normal.  Psychiatric:        Mood and Affect: Mood normal.        Behavior: Behavior normal.      UC Treatments / Results  Labs (all labs ordered are listed, but only abnormal results are displayed) Labs Reviewed - No data to display  EKG   Radiology DG Chest 2 View Result Date: 10/29/2023 CLINICAL DATA:  Cough for 4 weeks. EXAM: CHEST - 2 VIEW COMPARISON:  X-ray 02/06/2021. FINDINGS: No consolidation, pneumothorax or effusion. No edema. Normal cardiopericardial silhouette. The extreme inferior  costophrenic angles are clipped off the edge of the film on the frontal view. Mild degenerative changes of the spine. IMPRESSION: No acute cardiopulmonary disease. Electronically Signed   By: Adrianna Horde M.D.   On: 10/29/2023 18:04    Procedures Procedures (including critical care time)  Medications Ordered in UC Medications - No data to display  Initial Impression / Assessment and Plan / UC Course  I have reviewed the triage vital signs and the nursing notes.  Pertinent labs & imaging results that were available during my care of the patient were reviewed by me and considered in my medical decision making (see chart for details).   48 y/o female presents for 1 month history of cough and congestion. No fever or SOB. No relief with OTC meds.  Vitals normal and stable and patient is overall well appearing. No evidence of ear infection. Has nasal congestion. Throat clear. Few rhonchi.  Given duration of cough, CXR obtained.  Chest x-ray negative.  Acute bronchitis.  Explained to patient that symptoms would likely viral process most cases of bronchitis are.  Reviewed patient says she would like to try anything for shortness of better.  Explained that we can try an antibiotic since she has been ill for an entire month in case she is developing a bacterial infection.  I sent doxycycline  to pharmacy.  Also sent Promethazine  DM and prednisone .  Encouraged increased rest and fluids.  Explained that symptoms could last in a couple weeks.  Advised to return if fever, increased fatigue, breathing issues, etc.   Final Clinical Impressions(s) / UC Diagnoses   Final diagnoses:  Subacute cough  Acute bronchitis, unspecified organism     Discharge Instructions      - X-ray was negative. - I sent antibiotics sent to pharmacy -We discussed bronchitis is almost always viral and can last up to 6 weeks. -Sent for prednisone  and cough medicine. - If not feeling better in a couple more weeks, fever,  breathing difficulty or weakness please seek reevaluation.     ED Prescriptions     Medication Sig Dispense Auth. Provider   doxycycline  (VIBRAMYCIN ) 100 MG capsule Take 1 capsule (100 mg total) by mouth 2 (two) times daily for 7 days. 14 capsule Nancy Axon B, PA-C   promethazine -dextromethorphan (PROMETHAZINE -DM) 6.25-15 MG/5ML syrup Take 5 mLs by mouth 4 (four) times daily as needed. 118 mL Nancy Axon B, PA-C   predniSONE  (DELTASONE ) 10 MG tablet Take 6 tabs p.o. on day 1 and decrease by 1 tablet daily until complete 21 tablet Floydene Hy, PA-C      PDMP not reviewed this encounter.   Floydene Hy, PA-C 10/29/23 [id number]

## 2023-10-29 NOTE — Discharge Instructions (Signed)
-   X-ray was negative. - I sent antibiotics sent to pharmacy -We discussed bronchitis is almost always viral and can last up to 6 weeks. -Sent for prednisone  and cough medicine. - If not feeling better in a couple more weeks, fever, breathing difficulty or weakness please seek reevaluation.

## 2023-11-19 ENCOUNTER — Other Ambulatory Visit: Payer: Self-pay | Admitting: Family Medicine

## 2023-11-21 ENCOUNTER — Other Ambulatory Visit (HOSPITAL_COMMUNITY): Payer: Self-pay

## 2023-11-21 ENCOUNTER — Telehealth: Payer: Self-pay

## 2023-11-21 NOTE — Telephone Encounter (Signed)
 Product is excluded from coverage, no PA submitted at this time.

## 2023-11-21 NOTE — Telephone Encounter (Signed)
 Requested medication (s) are due for refill today: routing for review, possible PA needed  Requested medication (s) are on the active medication list: yes  Last refill:  10/08/23  Future visit scheduled: no  Notes to clinic:  Pharmacy comment: Alternative Requested:PA REQUIRED.      Requested Prescriptions  Pending Prescriptions Disp Refills   ZEPBOUND 2.5 MG/0.5ML Pen [Pharmacy Med Name: ZEPBOUND 2.5 MG/0.5 ML PEN]  0    Sig: INJECT 2.5 MG SUBCUTANEOUSLY WEEKLY     Off-Protocol Failed - 11/21/2023  8:37 AM      Failed - Medication not assigned to a protocol, review manually.      Passed - Valid encounter within last 12 months    Recent Outpatient Visits           1 month ago Routine general medical examination at a health care facility   [facility] [place], Megan P, DO   3 months ago Anxiety   [facility] [place], [place], [place]

## 2023-11-21 NOTE — Telephone Encounter (Signed)
 Pharmacy Patient Advocate Encounter   Received notification from CoverMyMeds that prior authorization for Zepbound 2.5MG /0.5ML pen is required/requested.   Insurance verification completed.   The patient is insured through [employer] .   Per test claim: Medication is not covered due to plan/benefit exclusion

## 2023-11-28 ENCOUNTER — Ambulatory Visit: Admitting: Family Medicine

## 2023-12-06 ENCOUNTER — Other Ambulatory Visit: Payer: Self-pay | Admitting: Family Medicine

## 2023-12-08 NOTE — Telephone Encounter (Signed)
 Requested Prescriptions  Pending Prescriptions Disp Refills   pantoprazole  (PROTONIX ) 40 MG tablet [Pharmacy Med Name: PANTOPRAZOLE  SOD DR 40 MG TAB] 180 tablet 1    Sig: TAKE 2 TABLETS BY MOUTH EVERY DAY     Gastroenterology: Proton Pump Inhibitors Passed - 12/08/2023  1:35 PM      Passed - Valid encounter within last 12 months    Recent Outpatient Visits           2 months ago Routine general medical examination at a health care facility   [facility], Megan P, DO   4 months ago Anxiety   [facility] [place], [place], [place]

## 2024-01-06 ENCOUNTER — Ambulatory Visit: Payer: Self-pay

## 2024-01-06 NOTE — Telephone Encounter (Signed)
 FYI Only or Action Required?: FYI only for provider.  Patient was last seen in primary care on 10/08/2023 by Vicci Duwaine SQUIBB, DO.  Called Nurse Triage reporting Cough.  Symptoms began a month ago, worsening since Friday.  Interventions attempted: Rest, hydration, or home remedies.  Symptoms are: gradually worsening.  Triage Disposition: See Physician Within 24 Hours  Patient/caregiver understands and will follow disposition?: Yes   Copied from CRM [id number]. Topic: Clinical - Red Word Triage >> Jan 06, 2024  4:47 PM Winona R wrote: Pre existing cough that has been getting worst and coughing out dark yellow mucus would like to be seen Friday morning. Reason for Disposition  SEVERE coughing spells (e.g., whooping sound after coughing, vomiting after coughing)  Answer Assessment - Initial Assessment Questions 1. ONSET: When did the cough begin?      Friday  Reports has been intermittent for months - June 23 2. SEVERITY: How bad is the cough today?      Driving me crazy 3. SPUTUM: Describe the color of your sputum (none, dry cough; clear, white, yellow, green)     yellow 4. HEMOPTYSIS: Are you coughing up any blood? If so ask: How much? (flecks, streaks, tablespoons, etc.)     Denies Does endorse some bloody drainage 5. DIFFICULTY BREATHING: Are you having difficulty breathing? If Yes, ask: How bad is it? (e.g., mild, moderate, severe)    - MILD: No SOB at rest, mild SOB with walking, speaks normally in sentences, can lie down, no retractions, pulse < 100.    - MODERATE: SOB at rest, SOB with minimal exertion and prefers to sit, cannot lie down flat, speaks in phrases, mild retractions, audible wheezing, pulse 100-120.    - SEVERE: Very SOB at rest, speaks in single words, struggling to breathe, sitting hunched forward, retractions, pulse > 120      Mild - sounds likes a crackly 6. FEVER: Do you have a fever? If Yes, ask: What is your temperature, how was it  measured, and when did it start?     denies 7. CARDIAC HISTORY: Do you have any history of heart disease? (e.g., heart attack, congestive heart failure)      denies 8. LUNG HISTORY: Do you have any history of lung disease?  (e.g., pulmonary embolus, asthma, emphysema)     Reports possibly asthma, hx of sinusitis 9. PE RISK FACTORS: Do you have a history of blood clots? (or: recent major surgery, recent prolonged travel, bedridden)     denies 10. OTHER SYMPTOMS: Do you have any other symptoms? (e.g., runny nose, wheezing, chest pain)       Chest soreness - pt thinks may be r/t coughing Some congestion Raspy voice 11. PREGNANCY: Is there any chance you are pregnant? When was your last menstrual period?       N/a 12. TRAVEL: Have you traveled out of the country in the last month? (e.g., travel history, exposures)       N/a    Triager scheduled with alternate provider.  Protocols used: Cough - Acute Productive-A-AH

## 2024-01-07 ENCOUNTER — Ambulatory Visit: Admitting: Nurse Practitioner

## 2024-01-07 ENCOUNTER — Encounter: Payer: Self-pay | Admitting: Nurse Practitioner

## 2024-01-07 VITALS — BP 122/74 | HR 80 | Temp 98.0°F | Ht 68.0 in | Wt 320.6 lb

## 2024-01-07 DIAGNOSIS — J4521 Mild intermittent asthma with (acute) exacerbation: Secondary | ICD-10-CM

## 2024-01-07 DIAGNOSIS — J45901 Unspecified asthma with (acute) exacerbation: Secondary | ICD-10-CM | POA: Insufficient documentation

## 2024-01-07 MED ORDER — ALBUTEROL SULFATE HFA 108 (90 BASE) MCG/ACT IN AERS: 2.0000 | INHALATION_SPRAY | Freq: Four times a day (QID) | RESPIRATORY_TRACT | 0 refills | Status: DC | PRN

## 2024-01-07 MED ORDER — PROMETHAZINE-DM 6.25-15 MG/5ML PO SYRP: 5.0000 mL | ORAL_SOLUTION | Freq: Four times a day (QID) | ORAL | 0 refills | Status: AC | PRN

## 2024-01-07 MED ORDER — PREDNISONE 20 MG PO TABS: 40.0000 mg | ORAL_TABLET | Freq: Every day | ORAL | 0 refills | Status: AC

## 2024-01-07 MED ORDER — DOXYCYCLINE HYCLATE 100 MG PO TABS: 100.0000 mg | ORAL_TABLET | Freq: Two times a day (BID) | ORAL | 0 refills | Status: AC

## 2024-01-07 NOTE — Assessment & Plan Note (Signed)
 Acute since 12/22/23.  Symptoms not improving and she reports more frequent episodes of acute coughs lately.  Will start Doxycyline which she has tolerated in past + Prednisone  40 MG daily for 5 days.  Cough syrup sent in to use PRN and Albuterol  inhaler.  Discussed with her in ongoing symptoms or frequent exacerbations may need to start a daily inhaler and get her back into pulmonary. Recommend: - Increased rest - Increasing Fluids - Acetaminophen  as needed for fever/pain.  - Salt water  gargling, chloraseptic spray and throat lozenges - Mucinex.  - Humidifying the air.

## 2024-01-07 NOTE — Progress Notes (Signed)
 BP 122/74   Pulse 80   Temp 98 F (36.7 C) (Oral)   Ht 5' 8 (1.727 m)   Wt (!) 320 lb 9.6 oz (145.4 kg)   LMP 09/05/2022 (Approximate)   SpO2 98%   BMI 48.75 kg/m    Subjective:    Patient ID: Kimberly Santiago, female    DOB: 1975/08/13, 48 y.o.   MRN: [id number]  HPI: Kimberly Santiago is a 48 y.o. female  Chief Complaint  Patient presents with   Cough    Patient states she has been having a productive cough since the end of June. States it comes and goes. States she has some chest congestion that comes and goes as well.    UPPER RESPIRATORY TRACT INFECTION Presents for cough since 12/22/23. Has underlying asthma noted on problem list, she does endorse seeing pulmonary in past for this but after her weight loss surgery this went away.  However, she reports having gained weight back and now notices more episodes of acute cough presenting. Fever: no Cough: yes Shortness of breath: yes Wheezing: yes Chest pain: no Chest tightness: yes Chest congestion: yes Nasal congestion: yes Runny nose: no Post nasal drip: no Sneezing: no Sore throat: no Swollen glands: no Sinus pressure: yes Headache: yes Face pain: yes Toothache: yes Ear pain: yes right Ear pressure: yes right Eyes red/itching:no Eye drainage/crusting: no  Vomiting: no Rash: no Fatigue: yes Sick contacts: no Strep contacts: no  Context: fluctuating Recurrent sinusitis: no Relief with OTC cold/cough medications: no  Treatments attempted: Allegra, Tylenol     Relevant past medical, surgical, family and social history reviewed and updated as indicated. Interim medical history since our last visit reviewed. Allergies and medications reviewed and updated.  Review of Systems  Constitutional:  Positive for fatigue. Negative for activity change, appetite change, chills and fever.  HENT:  Positive for congestion, ear pain, sinus pressure and sinus pain. Negative for ear discharge, facial swelling, postnasal  drip, rhinorrhea, sneezing, sore throat and voice change.   Eyes:  Negative for pain and visual disturbance.  Respiratory:  Positive for cough, chest tightness, shortness of breath and wheezing.   Cardiovascular:  Negative for chest pain, palpitations and leg swelling.  Gastrointestinal: Negative.   Neurological:  Positive for headaches. Negative for dizziness and numbness.  Psychiatric/Behavioral: Negative.      Per HPI unless specifically indicated above     Objective:    BP 122/74   Pulse 80   Temp 98 F (36.7 C) (Oral)   Ht 5' 8 (1.727 m)   Wt (!) 320 lb 9.6 oz (145.4 kg)   LMP 09/05/2022 (Approximate)   SpO2 98%   BMI 48.75 kg/m   Wt Readings from Last 3 Encounters:  01/07/24 (!) 320 lb 9.6 oz (145.4 kg)  10/29/23 (!) 318 lb (144.2 kg)  10/08/23 (!) 318 lb (144.2 kg)    Physical Exam Vitals and nursing note reviewed.  Constitutional:      General: She is awake. She is not in acute distress.    Appearance: She is well-developed and well-groomed. She is obese. She is ill-appearing. She is not toxic-appearing.  HENT:     Head: Normocephalic.     Right Ear: Hearing, ear canal and external ear normal. A middle ear effusion is present. Tympanic membrane is not injected or perforated.     Left Ear: Hearing, ear canal and external ear normal. A middle ear effusion is present. Tympanic membrane is not injected or perforated.  Nose: Rhinorrhea present. Rhinorrhea is clear.     Right Sinus: No maxillary sinus tenderness or frontal sinus tenderness.     Left Sinus: No maxillary sinus tenderness or frontal sinus tenderness.     Mouth/Throat:     Mouth: Mucous membranes are moist.     Pharynx: Posterior oropharyngeal erythema (mild) present. No pharyngeal swelling or oropharyngeal exudate.  Eyes:     General: Lids are normal.        Right eye: No discharge.        Left eye: No discharge.     Conjunctiva/sclera: Conjunctivae normal.     Pupils: Pupils are equal, round, and  reactive to light.  Neck:     Thyroid : No thyromegaly.     Vascular: No carotid bruit.  Cardiovascular:     Rate and Rhythm: Normal rate and regular rhythm.     Heart sounds: Normal heart sounds. No murmur heard.    No gallop.  Pulmonary:     Effort: Pulmonary effort is normal. No accessory muscle usage or respiratory distress.     Breath sounds: Wheezing present. No decreased breath sounds or rales.     Comments: Intermittent expiratory wheezes noted throughout and frequent cough present. Abdominal:     General: Bowel sounds are normal.     Palpations: Abdomen is soft. There is no hepatomegaly or splenomegaly.  Musculoskeletal:     Cervical back: Normal range of motion and neck supple.     Right lower leg: No edema.     Left lower leg: No edema.  Lymphadenopathy:     Head:     Right side of head: No submental, submandibular, tonsillar, preauricular or posterior auricular adenopathy.     Left side of head: No submental, submandibular, tonsillar, preauricular or posterior auricular adenopathy.     Cervical: No cervical adenopathy.  Skin:    General: Skin is warm and dry.  Neurological:     Mental Status: She is alert and oriented to person, place, and time.  Psychiatric:        Attention and Perception: Attention normal.        Mood and Affect: Mood normal.        Speech: Speech normal.        Behavior: Behavior normal. Behavior is cooperative.        Thought Content: Thought content normal.     Results for orders placed or performed in visit on 08/05/23  CBC with Differential/Platelet   Collection Time: 08/05/23  3:36 PM  Result Value Ref Range   WBC 8.7 3.4 - 10.8 x10E3/uL   RBC 4.82 3.77 - 5.28 x10E6/uL   Hemoglobin 14.2 11.1 - 15.9 g/dL   Hematocrit 57.4 65.9 - 46.6 %   MCV 88 79 - 97 fL   MCH 29.5 26.6 - 33.0 pg   MCHC 33.4 31.5 - 35.7 g/dL   RDW 86.5 88.2 - 84.5 %   Platelets 316 150 - 450 x10E3/uL   Neutrophils 61 Not Estab. %   Lymphs 26 Not Estab. %    Monocytes 9 Not Estab. %   Eos 3 Not Estab. %   Basos 1 Not Estab. %   Neutrophils Absolute 5.3 1.4 - 7.0 x10E3/uL   Lymphocytes Absolute 2.3 0.7 - 3.1 x10E3/uL   Monocytes Absolute 0.8 0.1 - 0.9 x10E3/uL   EOS (ABSOLUTE) 0.3 0.0 - 0.4 x10E3/uL   Basophils Absolute 0.1 0.0 - 0.2 x10E3/uL   Immature Granulocytes 0 Not Estab. %  Immature Grans (Abs) 0.0 0.0 - 0.1 x10E3/uL  Ferritin   Collection Time: 08/05/23  3:36 PM  Result Value Ref Range   Ferritin 18 15 - 150 ng/mL  B12   Collection Time: 08/05/23  3:36 PM  Result Value Ref Range   Vitamin B-12 394 232 - 1,245 pg/mL  Iron  Binding Cap (TIBC)([facility])   Collection Time: 08/05/23  3:36 PM  Result Value Ref Range   Total Iron  Binding Capacity 423 250 - 450 ug/dL   UIBC 661 868 - 574 ug/dL   Iron  85 27 - 159 ug/dL   Iron  Saturation 20 15 - 55 %  Comprehensive metabolic panel   Collection Time: 08/05/23  3:36 PM  Result Value Ref Range   Glucose 74 70 - 99 mg/dL   BUN 14 6 - 24 mg/dL   Creatinine, Ser 9.05 0.57 - 1.00 mg/dL   eGFR 76 >40 fO/fpw/8.26   BUN/Creatinine Ratio 15 9 - 23   Sodium 142 134 - 144 mmol/L   Potassium 4.2 3.5 - 5.2 mmol/L   Chloride 107 (H) 96 - 106 mmol/L   CO2 22 20 - 29 mmol/L   Calcium 8.5 (L) 8.7 - 10.2 mg/dL   Total Protein 6.1 6.0 - 8.5 g/dL   Albumin 3.5 (L) 3.9 - 4.9 g/dL   Globulin, Total 2.6 1.5 - 4.5 g/dL   Bilirubin Total <9.7 0.0 - 1.2 mg/dL   Alkaline Phosphatase 75 44 - 121 IU/L   AST 13 0 - 40 IU/L   ALT 12 0 - 32 IU/L  Lipid Panel w/o Chol/HDL Ratio   Collection Time: 08/05/23  3:36 PM  Result Value Ref Range   Cholesterol, Total 171 100 - 199 mg/dL   Triglycerides 886 0 - 149 mg/dL   HDL 59 >60 mg/dL   VLDL Cholesterol Cal 20 5 - 40 mg/dL   LDL Chol Calc (NIH) 92 0 - 99 mg/dL  TSH   Collection Time: 08/05/23  3:36 PM  Result Value Ref Range   TSH 2.000 0.450 - 4.500 uIU/mL  Bayer DCA Hb A1c Waived   Collection Time: 08/05/23  3:36 PM  Result Value Ref Range    HB A1C (BAYER DCA - WAIVED) 5.3 4.8 - 5.6 %      Assessment & Plan:   Problem List Items Addressed This Visit       Respiratory   Asthma exacerbation - Primary   Acute since 12/22/23.  Symptoms not improving and she reports more frequent episodes of acute coughs lately.  Will start Doxycyline which she has tolerated in past + Prednisone  40 MG daily for 5 days.  Cough syrup sent in to use PRN and Albuterol  inhaler.  Discussed with her in ongoing symptoms or frequent exacerbations may need to start a daily inhaler and get her back into pulmonary. Recommend: - Increased rest - Increasing Fluids - Acetaminophen  as needed for fever/pain.  - Salt water  gargling, chloraseptic spray and throat lozenges - Mucinex.  - Humidifying the air.       Relevant Medications   predniSONE  (DELTASONE ) 20 MG tablet   albuterol  (VENTOLIN  HFA) 108 (90 Base) MCG/ACT inhaler     Follow up plan: Return if symptoms worsen or fail to improve.

## 2024-01-07 NOTE — Patient Instructions (Signed)

## 2024-01-09 ENCOUNTER — Ambulatory Visit: Admitting: Family Medicine

## 2024-01-29 ENCOUNTER — Other Ambulatory Visit: Payer: Self-pay | Admitting: Nurse Practitioner

## 2024-01-29 NOTE — Telephone Encounter (Signed)
 Requested Prescriptions  Pending Prescriptions Disp Refills   albuterol  (VENTOLIN  HFA) 108 (90 Base) MCG/ACT inhaler [Pharmacy Med Name: ALBUTEROL  HFA (PROAIR ) INHALER] 8.5 each 0    Sig: TAKE 2 PUFFS BY MOUTH EVERY 6 HOURS AS NEEDED FOR WHEEZE OR SHORTNESS OF BREATH     Pulmonology:  [facility] [employer] 2 Passed - 01/29/2024  3:25 PM      Passed - Last BP in normal range    BP Readings from Last 1 Encounters:  01/07/24 122/74         Passed - Last Heart Rate in normal range    Pulse Readings from Last 1 Encounters:  01/07/24 80         Passed - Valid encounter within last 12 months    Recent Outpatient Visits           3 weeks ago Mild intermittent asthma with exacerbation   [place] [facility] [place], Melanie T, NP   3 months ago Routine general medical examination at a health care facility   [facility], Megan P, DO   5 months ago Anxiety   [facility] [place], [place], DO

## 2024-02-01 ENCOUNTER — Other Ambulatory Visit: Payer: Self-pay | Admitting: Family Medicine

## 2024-02-02 NOTE — Telephone Encounter (Signed)
 Requested Prescriptions  Pending Prescriptions Disp Refills   citalopram  (CELEXA ) 40 MG tablet [Pharmacy Med Name: CITALOPRAM  HBR 40 MG TABLET] 135 tablet 0    Sig: TAKE 1 & 1/2 TABLET BY MOUTH DAILY     Psychiatry:  Antidepressants - SSRI Passed - 02/02/2024  5:33 PM      Passed - Completed PHQ-2 or PHQ-9 in the last 360 days      Passed - Valid encounter within last 6 months    Recent Outpatient Visits           3 weeks ago Mild intermittent asthma with exacerbation   [place] [facility] [place], Melanie T, NP   3 months ago Routine general medical examination at a health care facility   [facility], Megan P, DO   6 months ago Anxiety   [place] [facility] [place], [place], DO

## 2024-03-15 ENCOUNTER — Encounter: Payer: Self-pay | Admitting: Family Medicine

## 2024-04-15 ENCOUNTER — Other Ambulatory Visit: Payer: Self-pay | Admitting: Family Medicine

## 2024-04-15 DIAGNOSIS — Z1231 Encounter for screening mammogram for malignant neoplasm of breast: Secondary | ICD-10-CM

## 2024-05-07 ENCOUNTER — Other Ambulatory Visit: Payer: Self-pay | Admitting: Internal Medicine

## 2024-05-07 DIAGNOSIS — G4733 Obstructive sleep apnea (adult) (pediatric): Secondary | ICD-10-CM

## 2024-05-07 DIAGNOSIS — R002 Palpitations: Secondary | ICD-10-CM

## 2024-05-07 DIAGNOSIS — R0602 Shortness of breath: Secondary | ICD-10-CM

## 2024-05-08 ENCOUNTER — Other Ambulatory Visit: Payer: Self-pay | Admitting: Family Medicine

## 2024-05-10 NOTE — Telephone Encounter (Signed)
 Requested Prescriptions  Pending Prescriptions Disp Refills   citalopram  (CELEXA ) 40 MG tablet [Pharmacy Med Name: CITALOPRAM  HBR 40 MG TABLET] 45 tablet 2    Sig: TAKE 1 AND 1/2 TABLETS DAILY BY MOUTH     Psychiatry:  Antidepressants - SSRI Failed - 05/10/2024  1:25 PM      Failed - Valid encounter within last 6 months    Recent Outpatient Visits           4 months ago Mild intermittent asthma with exacerbation   [place] [facility] [place], Melanie T, NP   7 months ago Routine general medical examination at a health care facility   [facility], Megan P, DO   9 months ago Anxiety   [place] [facility] [place], [place], DO              Passed - Completed PHQ-2 or PHQ-9 in the last 360 days

## 2024-05-18 ENCOUNTER — Other Ambulatory Visit: Payer: Self-pay | Admitting: Medical Genetics

## 2024-05-21 ENCOUNTER — Ambulatory Visit
Admission: RE | Admit: 2024-05-21 | Discharge: 2024-05-21 | Disposition: A | Discharge status: Home / Self Care | Payer: Self-pay | Source: Ambulatory Visit | Attending: Internal Medicine | Admitting: Internal Medicine

## 2024-05-21 DIAGNOSIS — R002 Palpitations: Secondary | ICD-10-CM | POA: Insufficient documentation

## 2024-05-21 DIAGNOSIS — G4733 Obstructive sleep apnea (adult) (pediatric): Secondary | ICD-10-CM | POA: Insufficient documentation

## 2024-05-21 DIAGNOSIS — R0602 Shortness of breath: Secondary | ICD-10-CM | POA: Insufficient documentation

## 2024-05-25 ENCOUNTER — Ambulatory Visit
Admission: RE | Admit: 2024-05-25 | Discharge: 2024-05-25 | Disposition: A | Discharge status: Home / Self Care | Source: Ambulatory Visit | Attending: Family Medicine | Admitting: Family Medicine

## 2024-05-25 DIAGNOSIS — Z1231 Encounter for screening mammogram for malignant neoplasm of breast: Secondary | ICD-10-CM | POA: Diagnosis present

## 2024-05-28 ENCOUNTER — Other Ambulatory Visit
Admission: RE | Admit: 2024-05-28 | Discharge: 2024-05-28 | Disposition: A | Discharge status: Home / Self Care | Payer: Self-pay | Source: Ambulatory Visit | Attending: Medical Genetics | Admitting: Medical Genetics

## 2024-05-30 ENCOUNTER — Ambulatory Visit: Payer: Self-pay | Admitting: Family Medicine

## 2024-06-09 LAB — GENECONNECT MOLECULAR SCREEN: Genetic Analysis Overall Interpretation: NEGATIVE

## 2024-06-20 ENCOUNTER — Other Ambulatory Visit: Payer: Self-pay | Admitting: Family Medicine

## 2024-06-23 NOTE — Telephone Encounter (Signed)
 Needs physical in April please

## 2024-06-28 NOTE — Telephone Encounter (Signed)
 Scheduled

## 2024-10-08 ENCOUNTER — Encounter: Admitting: Family Medicine
